# Patient Record
Sex: Female | Born: 1955
Health system: Southern US, Community
[De-identification: ages and names within clinical notes are randomized; demographics above are authoritative.]

## PROBLEM LIST (undated history)

## (undated) DIAGNOSIS — F17200 Nicotine dependence, unspecified, uncomplicated: Secondary | ICD-10-CM

## (undated) DIAGNOSIS — K219 Gastro-esophageal reflux disease without esophagitis: Secondary | ICD-10-CM

## (undated) DIAGNOSIS — C4490 Unspecified malignant neoplasm of skin, unspecified: Secondary | ICD-10-CM

## (undated) DIAGNOSIS — G8929 Other chronic pain: Secondary | ICD-10-CM

## (undated) DIAGNOSIS — R194 Change in bowel habit: Secondary | ICD-10-CM

## (undated) DIAGNOSIS — F419 Anxiety disorder, unspecified: Secondary | ICD-10-CM

## (undated) DIAGNOSIS — N8189 Other female genital prolapse: Secondary | ICD-10-CM

## (undated) DIAGNOSIS — J449 Chronic obstructive pulmonary disease, unspecified: Secondary | ICD-10-CM

## (undated) DIAGNOSIS — H8109 Meniere's disease, unspecified ear: Secondary | ICD-10-CM

## (undated) DIAGNOSIS — E785 Hyperlipidemia, unspecified: Secondary | ICD-10-CM

## (undated) DIAGNOSIS — J439 Emphysema, unspecified: Secondary | ICD-10-CM

## (undated) DIAGNOSIS — F32A Depression, unspecified: Secondary | ICD-10-CM

## (undated) DIAGNOSIS — K589 Irritable bowel syndrome without diarrhea: Secondary | ICD-10-CM

## (undated) DIAGNOSIS — F329 Major depressive disorder, single episode, unspecified: Secondary | ICD-10-CM

## (undated) DIAGNOSIS — R159 Full incontinence of feces: Secondary | ICD-10-CM

## (undated) DIAGNOSIS — D126 Benign neoplasm of colon, unspecified: Secondary | ICD-10-CM

## (undated) DIAGNOSIS — M25512 Pain in left shoulder: Secondary | ICD-10-CM

## (undated) DIAGNOSIS — R000 Tachycardia, unspecified: Secondary | ICD-10-CM

## (undated) HISTORY — DX: Chronic obstructive pulmonary disease, unspecified: J44.9

## (undated) HISTORY — PX: OTHER SURGICAL HISTORY: SHX169

## (undated) HISTORY — DX: Other chronic pain: G89.29

## (undated) HISTORY — DX: Pain in left shoulder: M25.512

## (undated) HISTORY — DX: Unspecified malignant neoplasm of skin, unspecified: C44.90

## (undated) HISTORY — DX: Change in bowel habit: R19.4

## (undated) HISTORY — DX: Benign neoplasm of colon, unspecified: D12.6

## (undated) HISTORY — DX: Emphysema, unspecified: J43.9

## (undated) HISTORY — DX: Depression, unspecified: F32.A

## (undated) HISTORY — DX: Hyperlipidemia, unspecified: E78.5

## (undated) HISTORY — PX: ABDOMINAL HYSTERECTOMY: SHX81

## (undated) HISTORY — DX: Gastro-esophageal reflux disease without esophagitis: K21.9

## (undated) HISTORY — DX: Full incontinence of feces: R15.9

## (undated) HISTORY — PX: BLADDER REPAIR: SHX76

## (undated) HISTORY — PX: KNEE ARTHROSCOPY: SUR90

## (undated) HISTORY — PX: SHOULDER ARTHROSCOPY: SHX128

## (undated) HISTORY — DX: Meniere's disease, unspecified ear: H81.09

## (undated) HISTORY — DX: Major depressive disorder, single episode, unspecified: F32.9

## (undated) HISTORY — DX: Tachycardia, unspecified: R00.0

## (undated) HISTORY — DX: Irritable bowel syndrome, unspecified: K58.9

## (undated) HISTORY — DX: Anxiety disorder, unspecified: F41.9

## (undated) HISTORY — DX: Nicotine dependence, unspecified, uncomplicated: F17.200

## (undated) HISTORY — DX: Other female genital prolapse: N81.89

---

## 1998-02-15 ENCOUNTER — Emergency Department (HOSPITAL_COMMUNITY): Admission: EM | Admit: 1998-02-15 | Discharge: 1998-02-15 | Payer: Self-pay | Admitting: Emergency Medicine

## 1998-02-24 ENCOUNTER — Encounter: Admission: RE | Admit: 1998-02-24 | Discharge: 1998-05-25 | Payer: Self-pay | Admitting: Orthopaedic Surgery

## 1998-07-01 ENCOUNTER — Other Ambulatory Visit: Admission: RE | Admit: 1998-07-01 | Discharge: 1998-07-01 | Payer: Self-pay | Admitting: Family Medicine

## 1999-08-19 ENCOUNTER — Emergency Department (HOSPITAL_COMMUNITY): Admission: EM | Admit: 1999-08-19 | Discharge: 1999-08-19 | Payer: Self-pay | Admitting: Emergency Medicine

## 1999-12-16 ENCOUNTER — Other Ambulatory Visit: Admission: RE | Admit: 1999-12-16 | Discharge: 1999-12-16 | Payer: Self-pay | Admitting: Family Medicine

## 2000-01-31 ENCOUNTER — Encounter (INDEPENDENT_AMBULATORY_CARE_PROVIDER_SITE_OTHER): Payer: Self-pay

## 2000-01-31 ENCOUNTER — Ambulatory Visit (HOSPITAL_COMMUNITY): Admission: RE | Admit: 2000-01-31 | Discharge: 2000-01-31 | Payer: Self-pay | Admitting: Gastroenterology

## 2000-01-31 DIAGNOSIS — D126 Benign neoplasm of colon, unspecified: Secondary | ICD-10-CM

## 2000-01-31 HISTORY — DX: Benign neoplasm of colon, unspecified: D12.6

## 2001-01-17 ENCOUNTER — Other Ambulatory Visit: Admission: RE | Admit: 2001-01-17 | Discharge: 2001-01-17 | Payer: Self-pay | Admitting: Family Medicine

## 2001-02-06 ENCOUNTER — Encounter: Payer: Self-pay | Admitting: Urology

## 2001-02-06 ENCOUNTER — Ambulatory Visit (HOSPITAL_COMMUNITY): Admission: RE | Admit: 2001-02-06 | Discharge: 2001-02-06 | Payer: Self-pay | Admitting: Urology

## 2001-03-29 ENCOUNTER — Ambulatory Visit (HOSPITAL_COMMUNITY): Admission: RE | Admit: 2001-03-29 | Discharge: 2001-03-29 | Payer: Self-pay | Admitting: Urology

## 2002-05-10 ENCOUNTER — Other Ambulatory Visit: Admission: RE | Admit: 2002-05-10 | Discharge: 2002-05-10 | Payer: Self-pay | Admitting: Family Medicine

## 2005-10-21 ENCOUNTER — Other Ambulatory Visit: Admission: RE | Admit: 2005-10-21 | Discharge: 2005-10-21 | Payer: Self-pay | Admitting: Family Medicine

## 2006-09-08 ENCOUNTER — Ambulatory Visit: Payer: Self-pay | Admitting: Family Medicine

## 2008-04-02 LAB — HM DEXA SCAN

## 2008-06-02 ENCOUNTER — Ambulatory Visit: Payer: Self-pay | Admitting: Gastroenterology

## 2008-06-04 ENCOUNTER — Encounter: Admission: RE | Admit: 2008-06-04 | Discharge: 2008-06-04 | Payer: Self-pay | Admitting: Family Medicine

## 2008-06-12 LAB — HM COLONOSCOPY

## 2008-06-13 ENCOUNTER — Encounter: Admission: RE | Admit: 2008-06-13 | Discharge: 2008-06-13 | Payer: Self-pay | Admitting: Family Medicine

## 2008-06-16 ENCOUNTER — Encounter: Payer: Self-pay | Admitting: Gastroenterology

## 2008-06-16 ENCOUNTER — Ambulatory Visit: Payer: Self-pay | Admitting: Gastroenterology

## 2008-06-18 ENCOUNTER — Encounter: Payer: Self-pay | Admitting: Gastroenterology

## 2008-11-24 ENCOUNTER — Ambulatory Visit: Payer: Self-pay | Admitting: Vascular Surgery

## 2009-05-13 LAB — HM PAP SMEAR

## 2009-10-09 ENCOUNTER — Emergency Department (HOSPITAL_COMMUNITY): Admission: EM | Admit: 2009-10-09 | Discharge: 2009-10-09 | Payer: Self-pay | Admitting: Emergency Medicine

## 2009-10-13 ENCOUNTER — Ambulatory Visit (HOSPITAL_COMMUNITY): Admission: RE | Admit: 2009-10-13 | Discharge: 2009-10-13 | Payer: Self-pay | Admitting: Family Medicine

## 2010-11-29 DIAGNOSIS — E785 Hyperlipidemia, unspecified: Secondary | ICD-10-CM | POA: Insufficient documentation

## 2010-11-29 DIAGNOSIS — F17200 Nicotine dependence, unspecified, uncomplicated: Secondary | ICD-10-CM

## 2010-11-29 DIAGNOSIS — G8929 Other chronic pain: Secondary | ICD-10-CM

## 2011-01-25 NOTE — Procedures (Signed)
LOWER EXTREMITY VENOUS REFLUX EXAM   INDICATION:  Bilateral lower extremity varicose veins.   EXAM:  Using color-flow imaging and pulse Doppler spectral analysis, the  right and left common femoral, superficial femoral, popliteal, posterior  tibial, greater and lesser saphenous veins are evaluated.  There is no  evidence suggesting deep venous insufficiency in the right and left  lower extremity.   The right and left saphenofemoral junction is competent.  The right and  left GSV is competent with the caliber as described below.   The right and left proximal short saphenous vein demonstrates  competency.   GSV Diameter (used if found to be incompetent only)                                            Right    Left  Proximal Greater Saphenous Vein           cm       cm  Proximal-to-mid-thigh                     cm       cm  Mid thigh                                 cm       cm  Mid-distal thigh                          cm       cm  Distal thigh                              cm       cm  Knee                                      cm       cm   IMPRESSION:  1. Right and left greater saphenous veins show no evidence of reflux.  2. The right and left greater saphenous veins are not aneurysmal.  3. The right and left greater saphenous veins are not tortuous.  4. The deep venous system is competent.  5. The right and left lesser saphenous vein is competent.   ___________________________________________  Quita Skye. Hart Rochester, M.D.   AC/MEDQ  D:  11/24/2008  T:  11/24/2008  Job:  [id number]

## 2011-01-25 NOTE — Consult Note (Signed)
NEW PATIENT CONSULTATION   Cassandra [name], Cassandra [name]  DOB:  [birth year]                                       11/24/2008  CHART#:[id number]   The patient was referred to Dr. Tania Ade for evaluation of venous arterial  disease in the left leg.  She is 55 year old healthy female who  approximately a month ago noticed some mottling and discoloration of the  left lateral thigh.  She denies any pain, aching, burning, cramping,  throbbing, distal edema, bleeding, ulceration or thrombophlebitis or  deep venous thrombosis in the left leg.  Apparently, she did have a  remote history of some blood clot in the right leg when she was a  teenager but nothing since that time.  She has no history of connective  tissue disorders, has not had a recent burn or any other kind of contact  dermatitis.  She has restless legs at night.  She does not know how far  she can ambulate but states that it is at least 1 block but less than a  mile.   PAST MEDICAL HISTORY:  1. Hyperlipidemia.  2. Chronic regional pain syndrome diagnosed by her medical doctor.  3. Negative for diabetes, hypertension, coronary artery disease or      stroke.   PAST SURGICAL HISTORY:  1. Left shoulder surgery x2.  2. Hysterectomy.  3. Bladder tack.   FAMILY HISTORY:  Positive for coronary artery disease in her mother,  father and brother, diabetes in a sister and grandmother, negative for  stroke.   SOCIAL HISTORY:  She is married, has 1 child.  She smokes a pack  cigarettes per day, has done so for 35+ years.  Does not use alcohol.   REVIEW OF SYSTEMS:  Complains of occasional chest discomfort, shortness  of breath both at rest and with ambulation, productive cough, occasional  wheezing, reflux esophagitis, occasional dizziness, depression,  nervousness.   ALLERGIES:  Codeine, Percodan, Percocet.   MEDICATIONS:  Please see health history form.   PHYSICAL EXAMINATION:  Blood pressure is 120/84, heart rate is  97,  respirations 14.  General:  She is a middle-aged female in no apparent  distress, alert and oriented x3.  Neck:  Supple, 3+ carotid pulses  palpable.  No bruits are audible.  Neurologic:  Normal.  No palpable  adenopathy in the neck.  Chest:  Clear to auscultation.  Cardiovascular:  Regular rhythm, no murmurs.  Abdomen:  Soft, nontender with no masses.  Extremities:  Reveal 3+ femoral, popliteal and posterior tibial pulses  palpable bilaterally.  There is no distal edema or evidence of venous  insufficiency, hyperpigmentation, ulceration or varicosities.  Left  lateral thigh has a peculiar appearance with some honeycomb pattern type  mottling with no ulceration or tenderness and no evidence of any trauma  to this area.  As noted, there is no evidence of any venous or arterial  insufficiency.  This extends up to the buttock crease and down to about  the knee on the lateral aspect only of the left leg.  It is not present  on the right lower extremity.   Venous duplex exam reveals no evidence of any reflux in the saphenous  veins bilaterally and the deep systems appear normal.   I am not certain what this represents in the left lateral thigh but it  does not appear  to be a vascular problem.  She has no evidence of  arterial insufficiency or venous disease.  If this should continue to be  present or progress, she should see a dermatologist for further  evaluation.   Quita Skye Hart Rochester, M.D.  Electronically Signed   JDL/MEDQ  D:  11/24/2008  T:  11/25/2008  Job:  [id number]   cc:   Belva Agee, Dr.  Ernestina Penna, M.D.

## 2011-01-26 ENCOUNTER — Encounter: Payer: Self-pay | Admitting: Family Medicine

## 2011-01-28 NOTE — Op Note (Signed)
[facility]  Patient:    Cassandra [name], Cassandra [name]                     MRN: [id number] Proc. Date: 03/29/01 Adm. Date:  [id number] Attending:  Evlyn Clines                           Operative Report  PROCEDURE:  Revision of SPARC sling.  PREOPERATIVE DIAGNOSIS:  Vaginal erosion of sling mesh.  POSTOPERATIVE DIAGNOSIS:  Vaginal erosion of sling mesh.  SURGEON:  Excell Seltzer. Annabell Howells, M.D.  ANESTHESIA:  General.  DRAIN:  Foley catheter.  COMPLICATIONS:  None.  INDICATIONS:  [name] is a 55 year old white female, who underwent a SPARC sling for stress incontinence approximately seven weeks ago.  She had a good response initially but approximately two weeks ago, she and her husband attempted intercourse; he had pain.  She came to the office, and she was found to have erosion of the mesh into the vaginal lumen.  Conservative to aid healing were unsuccessful.  She presents now for excision of the exposed mesh and revision of the repair.  FINDINGS AT PROCEDURE:  The patient was given p.o. Tequin preoperatively.  She was taken to the operating room where a general anesthetic was induced.  She was placed in lithotomy position.  Her perineum and genitalia were prepped with Betadine solution.  She was draped in the usual sterile fashion.  A weighted vaginal retractor was inserted.  A Foley catheter was placed.  The bladder was drained.  The vaginal mesh was easily identifiable in the mid urethral level.  The Struhle scissors were used to dissect around the mesh approximately 1 cm lateral on each side to the exposed portion.  Once the dissection had been performed, the area was copiously irrigated, first with Betadine then with antibiotic solution.  A 3 cm length of the mesh was then excised, taking it back to areas that appeared to be well seated within the tissue.  Once this had been performed, the area was reirrigated; hemostasis was achieved.  There was about a  2 cm circular defect where the mesh had been eroded.  In order to close this, I elevated a flap of some vaginal mucosa on the left.  There were a couple of prominent tags of vaginal tissue that had been present from prior to her initial surgery.  The one on the left was undermined and filleted to provide an adequate covering of the defect on the anterior vaginal wall.  This was then approximated over the defect using interrupted figure-of-eight 3-0 Vicryl stitches.  Once a secure closure had been performed and hemostasis was achieved, the area was irrigated.  I then removed the Foley catheter and passed the cystoscope with the 12 degree lens. The urethra appeared intact.  There was elevation in the mid urethral level from the repair, and that appeared to be still in good position.  At this point, the cystoscope was removed, and a Foley catheter was reinserted.  The patient was taken down from lithotomy position.  Her anesthetic was reversed. She was moved to the recovery room in stable condition.  There were no complications during the procedure. DD:  03/29/01 TD:  03/29/01 Job: [id number]

## 2011-01-28 NOTE — Op Note (Signed)
[facility]  Patient:    Cassandra [name], Cassandra [name]                     MRN: [id number] Proc. Date: 02/06/01 Adm. Date:  [id number] Attending:  Evlyn Clines CC:         Monica Becton, M.D.   Operative Report  PROCEDURE:  SPARC sling.  PREOPERATIVE DIAGNOSIS:  Stress urinary incontinence.  POSTOPERATIVE DIAGNOSIS:  Stress urinary incontinence.  SURGEON:  Excell Seltzer. Annabell Howells, M.D.  ANESTHESIA:  General.  DRAINS:  Foley catheter and vaginal pack.  COMPLICATIONS:  None.  INDICATIONS:  Ms. Beckley is a 55 year old white female with stress incontinence who has elected to undergo a SPARC sling.  FINDINGS AND PROCEDURE:  The patient was given p.o. Tequin and taken to the operating room where a general anesthetic was induced.  Her mons was shaved. She was prepped with Betadine solution and draped in the usual sterile fashion.  A Foley catheter was inserted and the bladder was drained.  Thirty cc of 0.08% Marcaine were injected in the retropubic space on each side and then the anterior vaginal wall was infiltrated with about 10 cc of the same Marcaine mix.  The anterior vaginal wall was then opened in the midline over the mid-urethra for approximately 2 cm.  Mucosa was elevated off the pubourethral fascia for approximately 1.5 to 2.0 cm laterally on each side. Puncture wounds were then made one fingerbreadth above the pubis and one fingerbreadth lateral to the midline using a 15 blade.  The [facility] trocars were then passed from above, first on the right, then on the left, with great care being taken to hug the underside of the pubis.  The urethra was positioned out of the way with the tip of the index finger which was used to guide the trocar into the vaginal incision; this was then repeated on the left.  Once both trocars were in position, cystoscopy was performed using a 21-French scope and the 12 and 70 degree lenses.  Examination revealed no evidence of  bladder or urethral injury.  The ureters were unremarkable.  Once cystoscopy had been completed, the bladder was left full.  The Foley catheter was reinserted and plugged.  The [facility] sling material was then snapped onto the trocars and pulled up into proper position.  Once in good position with an approximately 24-French diameter gap between the urethra and the sling material, the sling sheaths were removed, the position was adjusted to my satisfaction; the midline tethering suture was then trimmed.  The anterior vaginal wall was then closed with a running-locked 2-0 Vicryl.  The Foley was removed.  Pressure on the bladder produced some leakage which indicated no over-correction of the urethra.  The Foley catheter was then reinserted and the bladder was drained. The abdominal ends of the sling were then trimmed to a position just below the skin.  The abdominal wounds were cleaned, prepped with Benzoin and Steri-Strips were applied.  A two-inch Iodoform vaginal pack was placed.  The Foley catheter was placed to straight drainage.  Patient was taken down from lithotomy position, her anesthetic was reversed and she was moved to the recovery room in stable condition.  There were no complications. DD:  02/06/01 TD:  02/06/01 Job: [id number]

## 2011-01-28 NOTE — Procedures (Signed)
[facility]  Patient:    Cassandra [name], Cassandra [name]                     MRN: [id number] Proc. Date: 01/31/00 Adm. Date:  [id number] Disc. Date: [id number] Attending:  Deneen Harts CC:         Monica Becton, M.D.                           Procedure Report  PROCEDURE PERFORMED:  Colonoscopic polypectomy.  ENDOSCOPIST:  Griffith Citron, M.D.  INDICATIONS FOR PROCEDURE:  The patient is a 55 year old white female undergoing colonoscopy to evaluate symptoms of hematochezia.  DESCRIPTION OF PROCEDURE:  After reviewing the nature of the procedure with the patient including potential risks and complications, informed consent was signed.  In particular, the possibilities of hemorrhage and perforation discussed.  Using an Olympus pediatric PCF-140L video colonoscope, rectum was intubated after a normal digital examination.  Scope was inserted and advanced without difficulty around the entire length of the colon to the cecum, identified by the appendiceal orifice and ileocecal valve.  The colon was decompressed and scope withdrawn with careful inspection of the entire colon in a retrograde manner including a retroflex view in the rectal vault.  Significant finding included a sigmoid sessile polyp approximately 8 mm in diameter.  This was resected with electrocautery snare, recovered and submitted to pathology.  A half dozen diminutive polyps were resected with hot biopsy forceps throughout the rectosigmoid. No additional lesion was identified.  The colon was decompressed and the scope withdrawn.  The patient tolerated the procedure without difficulty being maintained on Datascope monitor and low-flow oxygen throughout.  TIME:  2.  TECHNICAL:  2.  PREPARATION:  2.  TOTAL SCORE:  6.  ASSESSMENT: 1. Rectosigmoid polyps--benign, resected. 2. Hematochezia--probable intermittent anorectal source.  RECOMMENDATIONS: 1. Postpolypectomy instructions  reviewed. 2. Follow up pathology. 3. Repeat colonoscopy in three years if adenoma. DD:  01/31/00 TD:  02/04/00 Job: [id number]

## 2012-12-05 ENCOUNTER — Other Ambulatory Visit: Payer: Self-pay | Admitting: *Deleted

## 2012-12-05 NOTE — Telephone Encounter (Signed)
Needs a pharmacy enetered

## 2012-12-06 ENCOUNTER — Other Ambulatory Visit: Payer: Self-pay | Admitting: Physician Assistant

## 2012-12-06 ENCOUNTER — Telehealth: Payer: Self-pay | Admitting: *Deleted

## 2012-12-06 ENCOUNTER — Encounter: Payer: Self-pay | Admitting: *Deleted

## 2012-12-06 DIAGNOSIS — G4700 Insomnia, unspecified: Secondary | ICD-10-CM

## 2012-12-06 DIAGNOSIS — N390 Urinary tract infection, site not specified: Secondary | ICD-10-CM

## 2012-12-06 MED ORDER — NITROFURANTOIN MACROCRYSTAL 50 MG PO CAPS: 50.0000 mg | ORAL_CAPSULE | Freq: Two times a day (BID) | ORAL | Status: DC

## 2012-12-06 MED ORDER — ESZOPICLONE 3 MG PO TABS: 3.0000 mg | ORAL_TABLET | Freq: Every day | ORAL | Status: DC

## 2012-12-06 MED ORDER — MECLIZINE HCL 25 MG PO TABS: 25.0000 mg | ORAL_TABLET | Freq: Two times a day (BID) | ORAL | Status: DC | PRN

## 2012-12-06 NOTE — Telephone Encounter (Signed)
Pt needs refill on lunesta 3mg . One po at bedtime as needed.

## 2012-12-06 NOTE — Telephone Encounter (Signed)
This encounter was created in error - please disregard.

## 2012-12-10 ENCOUNTER — Other Ambulatory Visit: Payer: Self-pay | Admitting: Physician Assistant

## 2012-12-10 MED ORDER — ESZOPICLONE 3 MG PO TABS: 3.0000 mg | ORAL_TABLET | Freq: Every day | ORAL | Status: DC

## 2012-12-10 NOTE — Progress Notes (Signed)
Duplicate message. 

## 2012-12-11 ENCOUNTER — Telehealth: Payer: Self-pay | Admitting: Pharmacist

## 2012-12-11 DIAGNOSIS — F411 Generalized anxiety disorder: Secondary | ICD-10-CM

## 2012-12-11 DIAGNOSIS — G894 Chronic pain syndrome: Secondary | ICD-10-CM

## 2012-12-11 DIAGNOSIS — E785 Hyperlipidemia, unspecified: Secondary | ICD-10-CM

## 2012-12-11 MED ORDER — PREGABALIN 75 MG PO CAPS: 75.0000 mg | ORAL_CAPSULE | Freq: Two times a day (BID) | ORAL | Status: DC

## 2012-12-11 MED ORDER — EZETIMIBE-ATORVASTATIN 10-20 MG PO TABS: 1.0000 | ORAL_TABLET | Freq: Every day | ORAL | Status: DC

## 2012-12-11 MED ORDER — OXYCODONE-ACETAMINOPHEN 5-325 MG PO TABS: 1.0000 | ORAL_TABLET | Freq: Three times a day (TID) | ORAL | Status: DC | PRN

## 2012-12-11 MED ORDER — ALPRAZOLAM 0.5 MG PO TBDP: 0.5000 mg | ORAL_TABLET | Freq: Two times a day (BID) | ORAL | Status: DC | PRN

## 2012-12-11 NOTE — Telephone Encounter (Signed)
rx printed out for oxycodone/APAP, xanax and lyrica - signed by ACM. Rx's left at front desk to pick up Left #21 Liptruzet at front deck

## 2012-12-18 ENCOUNTER — Other Ambulatory Visit: Payer: Self-pay | Admitting: *Deleted

## 2012-12-18 MED ORDER — ESCITALOPRAM OXALATE 10 MG PO TABS: 10.0000 mg | ORAL_TABLET | Freq: Every day | ORAL | Status: DC

## 2012-12-27 ENCOUNTER — Telehealth: Payer: Self-pay | Admitting: Pharmacist

## 2012-12-27 NOTE — Telephone Encounter (Signed)
Patient needs evaluation by primary provider.   Her previous PCP Helene Kelp is no longer here and there is not appt for anyone other that pharmacist at this time.  Please make appt for patient to see someone and evaluate Hpylori and need for colonoscopy and endoscopy.

## 2012-12-27 NOTE — Telephone Encounter (Signed)
Pt aware and appt made with [facility] 4/29

## 2013-01-08 ENCOUNTER — Ambulatory Visit (INDEPENDENT_AMBULATORY_CARE_PROVIDER_SITE_OTHER): Payer: Medicare Other | Admitting: General Practice

## 2013-01-08 ENCOUNTER — Telehealth: Payer: Self-pay | Admitting: Gastroenterology

## 2013-01-08 ENCOUNTER — Encounter: Payer: Self-pay | Admitting: General Practice

## 2013-01-08 VITALS — BP 150/83 | HR 84 | Temp 97.7°F | Ht 66.0 in | Wt 122.0 lb

## 2013-01-08 DIAGNOSIS — K219 Gastro-esophageal reflux disease without esophagitis: Secondary | ICD-10-CM

## 2013-01-08 DIAGNOSIS — R109 Unspecified abdominal pain: Secondary | ICD-10-CM

## 2013-01-08 DIAGNOSIS — R634 Abnormal weight loss: Secondary | ICD-10-CM

## 2013-01-08 DIAGNOSIS — G894 Chronic pain syndrome: Secondary | ICD-10-CM

## 2013-01-08 NOTE — Progress Notes (Signed)
  Subjective:    Patient ID: Cassandra [name], female    DOB: [birth year], 57 y.o.   MRN: [id number]  HPI Patient presents today with concerns of recent weight loss, nausea, vomiting, and bloody stools. Reports onset was in January, diagnosed with H. Pylori in February and treated. Reports she has gained 8 pounds, drinking 3 ensures daily, and eating yogurt. Reports having decreased appetite. Reports that her digestive system doesn't seem normal. Reports at times with swallowing throat feels irritated.     Review of Systems  Constitutional: Positive for activity change and appetite change.       More active than two months ago, but less active than normal.  Respiratory: Negative for chest tightness and shortness of breath.   Cardiovascular: Negative for chest pain.  Gastrointestinal: Positive for constipation. Negative for abdominal distention.       Dark blood in stool noted 2 weeks ago   Skin: Negative.   Neurological: Negative for headaches.       Vertigo  Psychiatric/Behavioral: The patient is nervous/anxious.        Reports anxiety increases with chronic pain issues       Objective:   Physical Exam  Constitutional: She is oriented to person, place, and time. She appears well-developed.  Cardiovascular: Normal rate, regular rhythm and normal heart sounds.   Pulmonary/Chest:  Diminished breath sounds througout  Abdominal: Soft. Bowel sounds are normal. She exhibits no distension and no mass. There is no tenderness. There is no rebound and no guarding.  Neurological: She is alert and oriented to person, place, and time.  Skin: Skin is warm and dry.  Psychiatric: She has a normal mood and affect.          Assessment & Plan:  Referral to GI Continue current medications RTO if symptoms worsen or unresolved Patient verbalized understanding Raymon Mutton, FNP-C

## 2013-01-08 NOTE — Patient Instructions (Addendum)
Helicobacter Pylori and Ulcer Disease  An ulcer may be in your stomach (gastric ulcer) or in the first part of your small bowel, which is called the duodenum (duodenal ulcer). An ulcer is a break in the stomach or duodenum lining. The break wears down into the deeper tissue. Helicobacter pylori (H. pylori) is a type of germ (bacteria) that may cause the majority of gastric or duodenal ulcers.  CAUSES   · A germ (bacterium). H. pylori can weaken the protective mucous coating of the stomach and duodenum. This allows acid to get through to the sensitive lining of the stomach or duodenum and an ulcer can then form.  · Certain medications.  · Using substances that can bother the lining of the stomach (alcohol, tobacco or medications such as Advil or Motrin) in the presence of H.pylori infection. This can increase the chances of getting an ulcer.  · Cancer (rarely).  Most people infected with H. pylori do not get ulcers. It is not known how people catch H. pylori. It may be through food or water. H. pylori has been found in the saliva of some infected people. Therefore, the bacteria may also spread through mouth-to-mouth contact such as kissing.  SYMPTOMS   The problems (symptoms) of ulcer disease are usually:  · A burning or gnawing of the mid-upper belly (abdomen). This is often worse on an empty stomach. It may get better with food. This may be associated with feeling sick to your stomach (nausea), bloating and vomiting.  · If the ulcer results in bleeding, it can cause:  · Black, tarry stools.  · Throwing up bright red blood.  · Throwing up coffee ground looking materials.  With severe bleeding, there may be loss of consciousness and shock.  Besides ulcer disease, H. pylori can also cause chronic gastritis (irritation of the lining of the stomach without ulcer) or stomach acid-type discomfort. You may not have symptoms even though you have an H. pylori infection. Although this is an infection, you may not have usual  infection symptoms (such as fever).  DIAGNOSIS   Ulcer disease can be diagnosed in many different ways. If you have an ulcer, it is important to know whether or not it is caused by H. Pylori. Treatment for an ulcer caused by H. pylori is different from that for an ulcer with other causes. The best way to detect H. pylori is taking tissue directly from the ulcer during an endoscopy test.   · An endoscopy is an exam that uses an endoscope. This is a thin, lighted tube with a small camera on the end. It is like a flexible telescope. The patient is given a drug to make them calm (sedative). The caregiver eases the endoscope into the mouth and down the throat to the stomach and duodenum. This allows the doctor to see the lining of the esophagus, stomach and duodenum.  · If an endoscopy is not needed, then H. pylori can be detected with tests of the blood, stool or even breath.  TREATMENT   · H. pylori peptic ulcer treatment usually involves a combination of:  · Medicines that kill germs (antibiotics).  · Acid suppressors.  · Stomach protectors.  · The use of only one medication to treat H. pylori is not recommended. The most proven treatment is a 2 week course of treatment called triple therapy. It involves taking two antibiotics to kill the bacteria and either an acid suppressor or stomach-lining shield. Two-week triple therapy reduces ulcer symptoms,   kills the bacteria, and prevents ulcers from coming back in many patients.  · Unfortunately, patients may find triple therapy hard to do. This is because it involves taking as many as 20 pills a day. Also, the antibiotics used in triple therapy may cause mild side effects. These include nausea, vomiting, diarrhea, dark stools, a metallic taste in the mouth, dizziness, headache and yeast infections in women. Talk to your caregiver if you have any of these side effects.  HOME CARE INSTRUCTIONS   · Take your medications as directed and for as long as prescribed. Contact your  caregiver if you have problems or side effects from your medications.  · Continue regular work and usual activities unless told otherwise by your caregiver.  · Avoid tobacco, alcohol and caffeine. Tobacco use will decrease and slow healing.  · Avoid medications that are harmful. This includes aspirin and NSAIDS such as ibuprofen and naproxen.  · Avoid foods that seem to aggravate or cause discomfort.  · There are many over-the-counter products available to control stomach acid and other symptoms. Discuss these with your caregiver before using them. Do not  stop taking prescription medications for over-the-counter medications without talking with your caregiver.  · Special diets are not usually needed.  · Keep any follow-up appointments and blood tests as directed.  SEEK MEDICAL CARE IF:   · Your pain or other ulcer symptoms do not improve within a few days of starting treatment.  · You develop diarrhea. This can be a problem related to certain treatments.  · You have ongoing indigestion or heartburn even if your main ulcer symptoms are improved.  · You think you have any side effects from your medications or if you do not understand how to use your medications right.  SEEK IMMEDIATE MEDICAL CARE IF:   Any of the following happen:  · You develop bright red, rectal bleeding.  · You develop dark black, tarry stools.  · You throw up (vomit) blood.  · You become light-headed, weak, have fainting episodes, or become sweaty, cold and clammy.  · You have severe abdominal pain not controlled by medications. Do not take pain medications unless ordered by your caregiver.  MAKE SURE YOU:   · Understand these instructions.  · Will watch your condition.  · Will get help right away if you are not doing well or get worse.  Document Released: 11/19/2003 Document Revised: 11/21/2011 Document Reviewed: 04/17/2008  [employer]® Patient Information ©2013 [employer].

## 2013-01-09 NOTE — Telephone Encounter (Signed)
Pt scheduled to see Doug Sou PA 01/11/13@10 :45am. Debbie to notify pt of appt date and time.

## 2013-01-10 ENCOUNTER — Other Ambulatory Visit: Payer: Self-pay | Admitting: General Practice

## 2013-01-10 ENCOUNTER — Telehealth: Payer: Self-pay | Admitting: Physician Assistant

## 2013-01-10 DIAGNOSIS — G894 Chronic pain syndrome: Secondary | ICD-10-CM

## 2013-01-10 DIAGNOSIS — F411 Generalized anxiety disorder: Secondary | ICD-10-CM

## 2013-01-10 MED ORDER — OXYCODONE-ACETAMINOPHEN 5-325 MG PO TABS: 1.0000 | ORAL_TABLET | Freq: Three times a day (TID) | ORAL | Status: DC | PRN

## 2013-01-10 MED ORDER — PREGABALIN 75 MG PO CAPS: 75.0000 mg | ORAL_CAPSULE | Freq: Two times a day (BID) | ORAL | Status: DC

## 2013-01-10 MED ORDER — ALPRAZOLAM 0.5 MG PO TABS: ORAL_TABLET | ORAL | Status: DC

## 2013-01-11 ENCOUNTER — Encounter: Payer: Self-pay | Admitting: Gastroenterology

## 2013-01-11 ENCOUNTER — Ambulatory Visit (INDEPENDENT_AMBULATORY_CARE_PROVIDER_SITE_OTHER): Payer: Medicare Other | Admitting: Gastroenterology

## 2013-01-11 VITALS — BP 120/80 | HR 80 | Ht 66.0 in | Wt 121.8 lb

## 2013-01-11 DIAGNOSIS — R1013 Epigastric pain: Secondary | ICD-10-CM | POA: Insufficient documentation

## 2013-01-11 DIAGNOSIS — F17200 Nicotine dependence, unspecified, uncomplicated: Secondary | ICD-10-CM | POA: Insufficient documentation

## 2013-01-11 DIAGNOSIS — R634 Abnormal weight loss: Secondary | ICD-10-CM | POA: Insufficient documentation

## 2013-01-11 NOTE — Patient Instructions (Addendum)
You have been scheduled for an endoscopy with propofol. Please follow written instructions given to you at your visit today. If you use inhalers (even only as needed), please bring them with you on the day of your procedure. Your physician has requested that you go to www.startemmi.com and enter the access code given to you at your visit today. This web site gives a general overview about your procedure. However, you should still follow specific instructions given to you by our office regarding your preparation for the procedure.  You have been scheduled for a CT scan of the abdomen and pelvis at [facility] ([address]---this is in the same building as Architectural [facility]).   You are scheduled on 01/17/13 at 1:00pm. You should arrive 15 minutes prior to your appointment time for registration. Please follow the written instructions below on the day of your exam:  WARNING: IF YOU ARE ALLERGIC TO IODINE/X-RAY DYE, PLEASE NOTIFY RADIOLOGY IMMEDIATELY AT [phone]! YOU WILL BE GIVEN A 13 HOUR PREMEDICATION PREP.  1) Do not eat or drink anything after 9:00am (4 hours prior to your test) 2) You have been given 2 bottles of oral contrast to drink. The solution may taste               better if refrigerated, but do NOT add ice or any other liquid to this solution. Shake             well before drinking.    Drink 1 bottle of contrast @ 11:00am (2 hours prior to your exam)  Drink 1 bottle of contrast @ 12:00am (1 hour prior to your exam)  You may take any medications as prescribed with a small amount of water except for the following: Metformin, Glucophage, Glucovance, Avandamet, Riomet, Fortamet, Actoplus Met, Janumet, Glumetza or Metaglip. The above medications must be held the day of the exam AND 48 hours after the exam.  The purpose of you drinking the oral contrast is to aid in the visualization of your intestinal tract. The contrast solution may cause some diarrhea. Before your exam  is started, you will be given a small amount of fluid to drink. Depending on your individual set of symptoms, you may also receive an intravenous injection of x-ray contrast/dye. Plan on being at [facility] for 30 minutes or long, depending on the type of exam you are having performed.  This test typically takes 30-45 minutes to complete.  If you have any questions regarding your exam or if you need to reschedule, you may call the CT department at [phone] between the hours of 8:00 am and 5:00 pm, Monday-Friday.  ________________________________________________________________________  Thank you for choosing me and [facility].  Doug Sou, PA-C

## 2013-01-11 NOTE — Progress Notes (Signed)
01/11/2013 Cassandra [name] [id number] 04/02/1956   History of Present Illness:  Patient is a 57 year old female who presents to our office today with complaints of weight loss and abdominal pain.  Began having epigastric pain in February and she was told that she might have an ulcer.  She was treated for Hpylori at that time as well.  She has lost 42 pounds in about 2 months time, but has now been drinking Ensure to help her put some weight back on and she is back up 8 pounds according to her report.  Still has some epigastric discomfort.  Says that a lot of labs have been performed through her PCP recently including thyroid studies (we do not have those records at this time, however, we are working on obtaining them).  She is on BID PPI.  Had one episode of constipation and took laxatives, but now bowels have been normal.  Colonoscopy 06/2008 showed one small TA and it was recommended that she have a repeat colonoscopy in 5 years from that time.  Current Medications, Allergies, Past Medical History, Past Surgical History, Family History and Social History were reviewed in [employer] record.   Physical Exam: There were no vitals taken for this visit. General: Well developed, white female in no acute distress Head: Normocephalic and atraumatic Eyes:  sclerae anicteric, conjunctiva pink  Ears: Normal auditory acuity Lungs: Clear throughout to auscultation Heart: Regular rate and rhythm Abdomen: Soft, non-distended. No masses, no hepatomegaly. Normal bowel sounds.  Mild epigastric TTP without R/R/G. Musculoskeletal: Symmetrical with no gross deformities  Extremities: No edema  Neurological: Alert oriented x 4, grossly nonfocal Psychological:  Alert and cooperative. Normal mood and affect  Assessment and Recommendations: -Weight loss of 42 pounds in 2 months in a patient who is a smoker. -Epigastric abdominal pain and recent treatment for Hpylori.  *Will schedule CT scan of  the chest/abdomen/pelvis with contrast. *EGD with Dr. Arlyce Dice.  The risks, benefits, and alternatives were discussed with the patient and she consents to proceed.  *She says that she's had several labs performed within the last couple of months, so we will contact her PCP to get those records. *Will need repeat colonoscopy in near future as well.

## 2013-01-14 NOTE — Progress Notes (Signed)
Reviewed and agree with management. Analina Filla D. Kendelle Schweers, M.D., FACG  

## 2013-01-17 ENCOUNTER — Ambulatory Visit (INDEPENDENT_AMBULATORY_CARE_PROVIDER_SITE_OTHER)
Admission: RE | Admit: 2013-01-17 | Discharge: 2013-01-17 | Disposition: A | Discharge status: Home / Self Care | Payer: Medicare Other | Source: Ambulatory Visit | Attending: Gastroenterology | Admitting: Gastroenterology

## 2013-01-17 DIAGNOSIS — R634 Abnormal weight loss: Secondary | ICD-10-CM

## 2013-01-17 DIAGNOSIS — R1013 Epigastric pain: Secondary | ICD-10-CM

## 2013-01-17 DIAGNOSIS — F17200 Nicotine dependence, unspecified, uncomplicated: Secondary | ICD-10-CM

## 2013-01-17 MED ORDER — IOHEXOL 300 MG/ML  SOLN
100.0000 mL | Freq: Once | INTRAMUSCULAR | Status: AC | PRN
  Administered 2013-01-17: 100 mL via INTRAVENOUS

## 2013-01-22 ENCOUNTER — Ambulatory Visit (AMBULATORY_SURGERY_CENTER): Payer: Medicare Other | Admitting: Gastroenterology

## 2013-01-22 ENCOUNTER — Other Ambulatory Visit (INDEPENDENT_AMBULATORY_CARE_PROVIDER_SITE_OTHER): Payer: Medicare Other

## 2013-01-22 ENCOUNTER — Encounter: Payer: Self-pay | Admitting: Gastroenterology

## 2013-01-22 VITALS — BP 129/88 | HR 76 | Temp 98.5°F | Resp 12 | Ht 66.0 in | Wt 121.0 lb

## 2013-01-22 DIAGNOSIS — R634 Abnormal weight loss: Secondary | ICD-10-CM

## 2013-01-22 DIAGNOSIS — R1013 Epigastric pain: Secondary | ICD-10-CM

## 2013-01-22 LAB — COMPREHENSIVE METABOLIC PANEL
ALT: 14 U/L (ref 0–35)
AST: 19 U/L (ref 0–37)
Albumin: 4.1 g/dL (ref 3.5–5.2)
CO2: 28 mEq/L (ref 19–32)
Calcium: 9.2 mg/dL (ref 8.4–10.5)
Chloride: 104 mEq/L (ref 96–112)
Creatinine, Ser: 0.8 mg/dL (ref 0.4–1.2)
GFR: 77.43 mL/min (ref >60.00)
Potassium: 3.8 mEq/L (ref 3.5–5.1)
Sodium: 140 mEq/L (ref 135–145)
Total Protein: 7.5 g/dL (ref 6.0–8.3)

## 2013-01-22 LAB — BASIC METABOLIC PANEL WITH GFR
CO2: 28 mEq/L (ref 19–32)
Chloride: 107 mEq/L (ref 96–112)
Potassium: 4.3 mEq/L (ref 3.5–5.3)
Sodium: 142 mEq/L (ref 135–145)

## 2013-01-22 LAB — CBC
Platelets: 317 10*3/uL (ref 150.0–400.0)
RBC: 4.89 Mil/uL (ref 3.87–5.11)

## 2013-01-22 LAB — LIPASE: Lipase: 19 U/L (ref 11.0–59.0)

## 2013-01-22 MED ORDER — SODIUM CHLORIDE 0.9 % IV SOLN: 500.0000 mL | INTRAVENOUS | Status: DC

## 2013-01-22 NOTE — Progress Notes (Signed)
Lidocaine-40mg IV prior to Propofol InductionPropofol given over incremental dosages 

## 2013-01-22 NOTE — Patient Instructions (Signed)
YOU HAD AN ENDOSCOPIC PROCEDURE TODAY AT THE [facility]: Refer to the procedure report that was given to you for any specific questions about what was found during the examination.  If the procedure report does not answer your questions, please call your gastroenterologist to clarify.  If you requested that your care partner not be given the details of your procedure findings, then the procedure report has been included in a sealed envelope for you to review at your convenience later.  YOU SHOULD EXPECT: Some feelings of bloating in the abdomen. Passage of more gas than usual.  Walking can help get rid of the air that was put into your GI tract during the procedure and reduce the bloating. If you had a lower endoscopy (such as a colonoscopy or flexible sigmoidoscopy) you may notice spotting of blood in your stool or on the toilet paper. If you underwent a bowel prep for your procedure, then you may not have a normal bowel movement for a few days.  DIET: Your first meal following the procedure should be a light meal and then it is ok to progress to your normal diet.  A half-sandwich or bowl of soup is an example of a good first meal.  Heavy or fried foods are harder to digest and may make you feel nauseous or bloated.  Likewise meals heavy in dairy and vegetables can cause extra gas to form and this can also increase the bloating.  Drink plenty of fluids but you should avoid alcoholic beverages for 24 hours.  ACTIVITY: Your care partner should take you home directly after the procedure.  You should plan to take it easy, moving slowly for the rest of the day.  You can resume normal activity the day after the procedure however you should NOT DRIVE or use heavy machinery for 24 hours (because of the sedation medicines used during the test).    SYMPTOMS TO REPORT IMMEDIATELY: A gastroenterologist can be reached at any hour.  During normal business hours, 8:30 AM to 5:00 PM Monday through Friday,  call [phone].  After hours and on weekends, please call the GI answering service at [phone] who will take a message and have the physician on call contact you.  Following upper endoscopy (EGD)  Vomiting of blood or coffee ground material  New chest pain or pain under the shoulder blades  Painful or persistently difficult swallowing  New shortness of breath  Fever of 100F or higher  Black, tarry-looking stools  FOLLOW UP: If any biopsies were taken you will be contacted by phone or by letter within the next 1-3 weeks.  Call your gastroenterologist if you have not heard about the biopsies in 3 weeks.  Our staff will call the home number listed on your records the next business day following your procedure to check on you and address any questions or concerns that you may have at that time regarding the information given to you following your procedure. This is a courtesy call and so if there is no answer at the home number and we have not heard from you through the emergency physician on call, we will assume that you have returned to your regular daily activities without incident.  SIGNATURES/CONFIDENTIALITY: You and/or your care partner have signed paperwork which will be entered into your electronic medical record.  These signatures attest to the fact that that the information above on your After Visit Summary has been reviewed and is understood.  Full responsibility of   the confidentiality of this discharge information lies with you and/or your care-partner. 

## 2013-01-22 NOTE — Op Note (Signed)
[facility] [address] [place], [address]   ENDOSCOPY PROCEDURE REPORT  PATIENT: Cassandra [name], Cassandra [name]  MR#: [id number] BIRTHDATE: 02-05-1956 , 57  yrs. old GENDER: Female ENDOSCOPIST: Louis Meckel, MD REFERRED BY:  Rudi Heap, M.D. PROCEDURE DATE:  01/22/2013 PROCEDURE:  EGD, diagnostic ASA CLASS:     Class II INDICATIONS:  Epigastric pain.   Weight loss. MEDICATIONS: MAC sedation, administered by CRNA, propofol (Diprivan) 100mg  IV, and Simethicone 0.6cc PO TOPICAL ANESTHETIC:  DESCRIPTION OF PROCEDURE: After the risks benefits and alternatives of the procedure were thoroughly explained, informed consent was obtained.  The LB GIF-H180 [id number] endoscope was introduced through the mouth and advanced to the third portion of the duodenum. Without limitations.  The instrument was slowly withdrawn as the mucosa was fully examined.      The upper, middle and distal third of the esophagus were carefully inspected and no abnormalities were noted.  The z-line was well seen at the GEJ.  The endoscope was pushed into the fundus which was normal including a retroflexed view.  The antrum, gastric body, first and second part of the duodenum were unremarkable. Retroflexed views revealed no abnormalities.     The scope was then withdrawn from the patient and the procedure completed.  COMPLICATIONS: There were no complications. ENDOSCOPIC IMPRESSION: Normal EGD  RECOMMENDATIONS: CBC, comprehensive metabolic profile, lipase, amylase Office visit 2-3 weeks REPEAT EXAM:  eSigned:  Louis Meckel, MD 01/22/2013 1:57 PM   CC:

## 2013-01-23 ENCOUNTER — Telehealth: Payer: Self-pay | Admitting: *Deleted

## 2013-01-23 NOTE — Telephone Encounter (Signed)
  Follow up Call-  Call back number 01/22/2013  Post procedure Call Back phone  # [phone]  Permission to leave phone message Yes     Patient questions:  Do you have a fever, pain , or abdominal swelling? no Pain Score  0 *  Have you tolerated food without any problems? yes  Have you been able to return to your normal activities? yes  Do you have any questions about your discharge instructions: Diet   no Medications  no Follow up visit  no  Do you have questions or concerns about your Care? no  Actions: * If pain score is 4 or above: No action needed, pain <4.

## 2013-01-23 NOTE — Progress Notes (Signed)
Quick Note:  Please inform the patient that lab work was normal and to continue current plan of action. She needs an office visit in about 3 weeks ______

## 2013-01-25 NOTE — Telephone Encounter (Signed)
error 

## 2013-02-07 ENCOUNTER — Encounter: Payer: Self-pay | Admitting: Gastroenterology

## 2013-02-07 ENCOUNTER — Other Ambulatory Visit: Payer: Self-pay | Admitting: Family Medicine

## 2013-02-07 ENCOUNTER — Ambulatory Visit (INDEPENDENT_AMBULATORY_CARE_PROVIDER_SITE_OTHER): Payer: Medicare Other | Admitting: Gastroenterology

## 2013-02-07 VITALS — BP 114/70 | HR 80 | Ht 66.0 in | Wt 121.6 lb

## 2013-02-07 DIAGNOSIS — Z860100 Personal history of colon polyps, unspecified: Secondary | ICD-10-CM | POA: Insufficient documentation

## 2013-02-07 DIAGNOSIS — R1013 Epigastric pain: Secondary | ICD-10-CM

## 2013-02-07 NOTE — Progress Notes (Signed)
History of Present Illness:  Ms. Cassandra [name] returned following upper endoscopy. CT scan and blood work. The workup was negative for a source of pain or weight loss. Abdominal pain has entirely subsided. Anorexia is improved. She's gained weight taking 4 cans of Ensure daily. She claims that she has been under a great deal of stress and is now feeling better.    Review of Systems: Pertinent positive and negative review of systems were noted in the above HPI section. All other review of systems were otherwise negative.    Current Medications, Allergies, Past Medical History, Past Surgical History, Family History and Social History were reviewed in [employer] electronic medical record  Vital signs were reviewed in today's medical record. Physical Exam: General: Well developed , well nourished, no acute distress

## 2013-02-07 NOTE — Assessment & Plan Note (Signed)
Plan followup colonoscopy 

## 2013-02-07 NOTE — Assessment & Plan Note (Addendum)
Workup for abdominal pain, anorexia weight loss was negative including CT scan, lab work and endoscopy. I suspect that symptoms were stress-related. Symptoms have significantly improved.  Recommendations #1 patient was encouraged to try to eat regular meals and supplement meals with Ensure #2 followup in 8-10 weeks

## 2013-02-07 NOTE — Patient Instructions (Addendum)
Follow up in 2 months

## 2013-02-08 ENCOUNTER — Other Ambulatory Visit: Payer: Self-pay | Admitting: Nurse Practitioner

## 2013-02-08 DIAGNOSIS — N390 Urinary tract infection, site not specified: Secondary | ICD-10-CM

## 2013-02-11 ENCOUNTER — Telehealth: Payer: Self-pay | Admitting: Family Medicine

## 2013-02-11 DIAGNOSIS — G894 Chronic pain syndrome: Secondary | ICD-10-CM

## 2013-02-11 MED ORDER — OXYCODONE-ACETAMINOPHEN 5-325 MG PO TABS: 1.0000 | ORAL_TABLET | Freq: Three times a day (TID) | ORAL | Status: DC | PRN

## 2013-02-11 NOTE — Telephone Encounter (Signed)
Pt aware,rx for lunesta ready. She will call our pharmacist for oxycodone refills.

## 2013-02-12 ENCOUNTER — Other Ambulatory Visit: Payer: Self-pay

## 2013-02-12 DIAGNOSIS — N390 Urinary tract infection, site not specified: Secondary | ICD-10-CM

## 2013-02-12 MED ORDER — NITROFURANTOIN MACROCRYSTAL 50 MG PO CAPS: 50.0000 mg | ORAL_CAPSULE | Freq: Two times a day (BID) | ORAL | Status: DC

## 2013-02-12 NOTE — Telephone Encounter (Signed)
Last seen 01/08/13   Last filled 12/06/12

## 2013-02-14 ENCOUNTER — Ambulatory Visit (INDEPENDENT_AMBULATORY_CARE_PROVIDER_SITE_OTHER): Payer: Medicare Other | Admitting: Pharmacist

## 2013-02-14 VITALS — BP 133/70 | HR 78 | Ht 66.0 in | Wt 123.0 lb

## 2013-02-14 DIAGNOSIS — G8929 Other chronic pain: Secondary | ICD-10-CM

## 2013-02-14 DIAGNOSIS — G894 Chronic pain syndrome: Secondary | ICD-10-CM

## 2013-02-14 DIAGNOSIS — E785 Hyperlipidemia, unspecified: Secondary | ICD-10-CM

## 2013-02-14 DIAGNOSIS — M25519 Pain in unspecified shoulder: Secondary | ICD-10-CM

## 2013-02-14 MED ORDER — ALPRAZOLAM 0.5 MG PO TABS: ORAL_TABLET | ORAL | Status: DC

## 2013-02-14 MED ORDER — PREGABALIN 75 MG PO CAPS: 75.0000 mg | ORAL_CAPSULE | Freq: Two times a day (BID) | ORAL | Status: DC

## 2013-02-14 MED ORDER — EZETIMIBE-ATORVASTATIN 10-20 MG PO TABS: 1.0000 | ORAL_TABLET | Freq: Every day | ORAL | Status: DC

## 2013-02-14 MED ORDER — OXYCODONE-ACETAMINOPHEN 5-325 MG PO TABS: 1.0000 | ORAL_TABLET | Freq: Three times a day (TID) | ORAL | Status: DC | PRN

## 2013-02-14 NOTE — Progress Notes (Signed)
Subjective:    Cassandra [name] is a 57 y.o. female who presents for follow up of chronic pain.  Nature of the pain: chronic shoulder pain and occasional neuropathy in arm and legs Location of the pain: Left shoulder  Intensity: currently 7/10,  Best is 5/10 relieving factors: heat, hot bath, medication Adverse effects of medications:  Constipation 1 times per week - relieved with dulcolax  Current medications for pain: oxycodone/APAP 5/325mg  1 tablet q 8 hours as needed (last rx 01/11/13)  Lyrica 75mg  1 capsule qd - bid  Lidocaine patches - applied to area of pain for 12 hours, then remove for 12 hours  Uses TENS unit     Last urine drug screen from 01/12/13 was as expected   Assessment:    Assessment: chronic shoulder pain .    Plan:     Continue percocet 5/325mg  1 tablet q8h as needed - rx given to patient today for #90 (Rx from 02/12/13 for #30 tablets was voided and destroyed)      Recommended increase light exercise like walking as tolerated.    Referral to pain clinic - Dr Laurian Brim for treatment

## 2013-03-07 ENCOUNTER — Telehealth: Payer: Self-pay | Admitting: Pharmacist

## 2013-03-07 DIAGNOSIS — G894 Chronic pain syndrome: Secondary | ICD-10-CM

## 2013-03-07 NOTE — Telephone Encounter (Signed)
Spoke with patient.  Today is a little too early to fill her prescriptions - she is not out.   Will wait until first of next week to get Rx's

## 2013-03-11 ENCOUNTER — Other Ambulatory Visit: Payer: Self-pay | Admitting: Family Medicine

## 2013-03-11 DIAGNOSIS — G894 Chronic pain syndrome: Secondary | ICD-10-CM

## 2013-03-11 MED ORDER — PREGABALIN 75 MG PO CAPS: 75.0000 mg | ORAL_CAPSULE | Freq: Two times a day (BID) | ORAL | Status: DC

## 2013-03-11 MED ORDER — ALPRAZOLAM 0.5 MG PO TABS: ORAL_TABLET | ORAL | Status: DC

## 2013-03-11 MED ORDER — OXYCODONE-ACETAMINOPHEN 5-325 MG PO TABS: 1.0000 | ORAL_TABLET | Freq: Three times a day (TID) | ORAL | Status: DC | PRN

## 2013-03-11 NOTE — Telephone Encounter (Signed)
rx printed - can fill 03/14/13

## 2013-03-12 ENCOUNTER — Other Ambulatory Visit: Payer: Self-pay | Admitting: *Deleted

## 2013-03-12 MED ORDER — ESZOPICLONE 3 MG PO TABS: ORAL_TABLET | ORAL | Status: DC

## 2013-03-12 NOTE — Telephone Encounter (Signed)
[facility].

## 2013-03-12 NOTE — Telephone Encounter (Signed)
Please call into pharmacy. thx 

## 2013-03-12 NOTE — Telephone Encounter (Signed)
LAST RF 02/14/13. CALL IN [facility] IF APPROVED.

## 2013-03-14 ENCOUNTER — Other Ambulatory Visit: Payer: Self-pay | Admitting: *Deleted

## 2013-03-14 MED ORDER — MECLIZINE HCL 25 MG PO TABS: ORAL_TABLET | ORAL | Status: DC

## 2013-03-14 MED ORDER — ESCITALOPRAM OXALATE 10 MG PO TABS: ORAL_TABLET | ORAL | Status: DC

## 2013-04-15 ENCOUNTER — Telehealth: Payer: Self-pay | Admitting: Pharmacist

## 2013-04-15 DIAGNOSIS — G894 Chronic pain syndrome: Secondary | ICD-10-CM

## 2013-04-15 DIAGNOSIS — F411 Generalized anxiety disorder: Secondary | ICD-10-CM

## 2013-04-15 MED ORDER — PREGABALIN 75 MG PO CAPS: 75.0000 mg | ORAL_CAPSULE | Freq: Two times a day (BID) | ORAL | Status: DC

## 2013-04-15 MED ORDER — OXYCODONE-ACETAMINOPHEN 5-325 MG PO TABS: 1.0000 | ORAL_TABLET | Freq: Three times a day (TID) | ORAL | Status: DC | PRN

## 2013-04-15 MED ORDER — ALPRAZOLAM 0.5 MG PO TABS: ORAL_TABLET | ORAL | Status: DC

## 2013-04-15 NOTE — Telephone Encounter (Signed)
Made referral to Dr Laurian Brim for evaluation of chronic pain.  It looks like their office tried to call her in June.  I asked patient and she said they did call in June but told her they would call back to make appt once they received her records. She has not heard from them.   Will forward this message to Carlon in our referral office to see what is going on.

## 2013-04-17 ENCOUNTER — Other Ambulatory Visit: Payer: Self-pay | Admitting: Nurse Practitioner

## 2013-04-18 ENCOUNTER — Other Ambulatory Visit: Payer: Self-pay | Admitting: *Deleted

## 2013-04-18 MED ORDER — ESZOPICLONE 3 MG PO TABS: ORAL_TABLET | ORAL | Status: DC

## 2013-04-18 NOTE — Telephone Encounter (Signed)
Patient last seen in office on 02-14-13 with Tammy. Please advise. No other office visits in epic. Please advise. If approved please route to Pool B so [facility]

## 2013-04-18 NOTE — Telephone Encounter (Signed)
Patient requesting refill. Last seen in office on 02-14-13 with Tammy. Please advise. No visit with you in Epic.

## 2013-04-22 ENCOUNTER — Encounter: Payer: Self-pay | Admitting: Gastroenterology

## 2013-05-16 ENCOUNTER — Encounter: Payer: Self-pay | Admitting: General Practice

## 2013-05-16 ENCOUNTER — Ambulatory Visit (INDEPENDENT_AMBULATORY_CARE_PROVIDER_SITE_OTHER): Payer: Medicare Other | Admitting: General Practice

## 2013-05-16 VITALS — BP 145/77 | HR 75 | Temp 97.3°F | Ht 66.0 in | Wt 129.0 lb

## 2013-05-16 DIAGNOSIS — R82998 Other abnormal findings in urine: Secondary | ICD-10-CM

## 2013-05-16 DIAGNOSIS — G894 Chronic pain syndrome: Secondary | ICD-10-CM

## 2013-05-16 DIAGNOSIS — G4700 Insomnia, unspecified: Secondary | ICD-10-CM

## 2013-05-16 DIAGNOSIS — F411 Generalized anxiety disorder: Secondary | ICD-10-CM

## 2013-05-16 DIAGNOSIS — R8290 Unspecified abnormal findings in urine: Secondary | ICD-10-CM

## 2013-05-16 DIAGNOSIS — Z8639 Personal history of other endocrine, nutritional and metabolic disease: Secondary | ICD-10-CM

## 2013-05-16 DIAGNOSIS — Z862 Personal history of diseases of the blood and blood-forming organs and certain disorders involving the immune mechanism: Secondary | ICD-10-CM

## 2013-05-16 DIAGNOSIS — Z0000 Encounter for general adult medical examination without abnormal findings: Secondary | ICD-10-CM

## 2013-05-16 LAB — POCT CBC
Granulocyte percent: 63.3 %G (ref 37–80)
Lymph, poc: 2.6 (ref 0.6–3.4)
MCV: 92.8 fL (ref 80–97)
MPV: 7.8 fL (ref 0–99.8)
Platelet Count, POC: 300 10*3/uL (ref 142–424)
RBC: 5.2 M/uL (ref 4.04–5.48)

## 2013-05-16 LAB — POCT UA - MICROSCOPIC ONLY: RBC, urine, microscopic: NEGATIVE

## 2013-05-16 LAB — POCT URINALYSIS DIPSTICK
Glucose, UA: NEGATIVE
Leukocytes, UA: NEGATIVE
Nitrite, UA: NEGATIVE
Spec Grav, UA: <=1.005
Urobilinogen, UA: NEGATIVE

## 2013-05-16 MED ORDER — ESZOPICLONE 3 MG PO TABS: ORAL_TABLET | ORAL | Status: DC

## 2013-05-16 MED ORDER — OXYCODONE-ACETAMINOPHEN 5-325 MG PO TABS: 1.0000 | ORAL_TABLET | Freq: Three times a day (TID) | ORAL | Status: DC | PRN

## 2013-05-16 MED ORDER — ALPRAZOLAM 0.5 MG PO TABS: ORAL_TABLET | ORAL | Status: DC

## 2013-05-16 NOTE — Progress Notes (Signed)
  Subjective:    Patient ID: Cassandra [name], female    DOB: 12-09-1955, 57 y.o.   MRN: [id number]  HPI Patient presents today for follow up of chronic health conditions. She has a history of chronic left shoulder pain, anxiety, depression, insomnia, vertigo, and recurrent urinary tract infections. She reports having headaches (generalized) which began two to three weeks ago. She reports being stressed and feels it may be related. She reports smoking cigarettes (1ppd) and interested in smoking cessation. Reports trying chantix in the past and had nausea/ vivid dreams. She reports having marital issues and denies counseling at this time.    Review of Systems  Constitutional: Negative for fever and chills.  Respiratory: Negative for chest tightness, shortness of breath and wheezing.   Cardiovascular: Negative for chest pain and palpitations.  Gastrointestinal: Negative for nausea, vomiting, abdominal pain and blood in stool.  Genitourinary: Negative for dysuria, hematuria and difficulty urinating.  Neurological: Positive for dizziness. Negative for weakness and headaches.       Objective:   Physical Exam  Constitutional: She is oriented to person, place, and time. She appears well-developed and well-nourished.  Cardiovascular: Normal rate, regular rhythm and normal heart sounds.   Pulmonary/Chest: Effort normal and breath sounds normal. No respiratory distress. She exhibits no tenderness.  Abdominal: Soft. Bowel sounds are normal. She exhibits no distension. There is no tenderness.  Neurological: She is alert and oriented to person, place, and time.  Skin: Skin is warm and dry.  Psychiatric: She has a normal mood and affect.          Assessment & Plan:  1. Anxiety state, unspecified - ALPRAZolam (XANAX) 0.5 MG tablet; Take one tablet twice a day as needed for anxiety.  Dispense: 60 tablet; Refill: 0  2. Chronic pain syndrome - oxyCODONE-acetaminophen (PERCOCET) 5-325 MG per tablet;  Take 1 tablet by mouth every 8 (eight) hours as needed for pain.  Dispense: 90 tablet; Refill: 0  3. Insomnia - Eszopiclone (ESZOPICLONE) 3 MG TABS; TAKE ONE TABLET AT BEDTIME AS NEEDED  Dispense: 30 tablet; Refill: 0  4. History of hyperlipidemia - NMR, lipoprofile  5. Bad odor of urine - POCT urinalysis dipstick - POCT UA - Microscopic Only  6. Annual physical exam - POCT CBC - CMP14+EGFR -Continue all current medications Labs pending F/u in 3 months Discussed exercise and diet  Patient verbalized understanding Coralie Keens, FNP-C

## 2013-05-16 NOTE — Patient Instructions (Signed)
Shoulder Pain  The shoulder is the joint that connects your arms to your body. The bones that form the shoulder joint include the upper arm bone (humerus), the shoulder blade (scapula), and the collarbone (clavicle). The top of the humerus is shaped like a ball and fits into a rather flat socket on the scapula (glenoid cavity). A combination of muscles and strong, fibrous tissues that connect muscles to bones (tendons) support your shoulder joint and hold the ball in the socket. Small, fluid-filled sacs (bursae) are located in different areas of the joint. They act as cushions between the bones and the overlying soft tissues and help reduce friction between the gliding tendons and the bone as you move your arm. Your shoulder joint allows a wide range of motion in your arm. This range of motion allows you to do things like scratch your back or throw a ball. However, this range of motion also makes your shoulder more prone to pain from overuse and injury.  Causes of shoulder pain can originate from both injury and overuse and usually can be grouped in the following four categories:   Redness, swelling, and pain (inflammation) of the tendon (tendinitis) or the bursae (bursitis).   Instability, such as a dislocation of the joint.   Inflammation of the joint (arthritis).   Broken bone (fracture).  HOME CARE INSTRUCTIONS    Apply ice to the sore area.   Put ice in a plastic bag.   Place a towel between your skin and the bag.   Leave the ice on for 15-20 minutes, 3-4 times per day for the first 2 days.   Stop using cold packs if they do not help with the pain.   If you have a shoulder sling or immobilizer, wear it as long as your caregiver instructs. Only remove it to shower or bathe. Move your arm as little as possible, but keep your hand moving to prevent swelling.   Squeeze a soft ball or foam pad as much as possible to help prevent swelling.   Only take over-the-counter or prescription medicines for pain,  discomfort, or fever as directed by your caregiver.  SEEK MEDICAL CARE IF:    Your shoulder pain increases, or new pain develops in your arm, hand, or fingers.   Your hand or fingers become cold and numb.   Your pain is not relieved with medicines.  SEEK IMMEDIATE MEDICAL CARE IF:    Your arm, hand, or fingers are numb or tingling.   Your arm, hand, or fingers are significantly swollen or turn white or blue.  MAKE SURE YOU:    Understand these instructions.   Will watch your condition.   Will get help right away if you are not doing well or get worse.  Document Released: 06/08/2005 Document Revised: 05/23/2012 Document Reviewed: 08/13/2011  [employer] Patient Information 2014 [employer].

## 2013-05-17 ENCOUNTER — Other Ambulatory Visit: Payer: Self-pay | Admitting: Nurse Practitioner

## 2013-05-18 LAB — CMP14+EGFR
Albumin/Globulin Ratio: 2 (ref 1.1–2.5)
Albumin: 4.7 g/dL (ref 3.5–5.5)
BUN: 11 mg/dL (ref 6–24)
GFR calc Af Amer: 109 mL/min/{1.73_m2} (ref >59)
GFR calc non Af Amer: 95 mL/min/{1.73_m2} (ref >59)
Glucose: 95 mg/dL (ref 65–99)
Total Bilirubin: 0.2 mg/dL (ref 0.0–1.2)
Total Protein: 7.1 g/dL (ref 6.0–8.5)

## 2013-05-18 LAB — NMR, LIPOPROFILE
HDL Particle Number: 33.3 umol/L (ref >=30.5)
LDLC SERPL CALC-MCNC: 163 mg/dL — ABNORMAL HIGH (ref <100)
LP-IR Score: <25 (ref <=45)

## 2013-05-21 NOTE — Telephone Encounter (Signed)
It looks like her u/a was normal?

## 2013-05-23 ENCOUNTER — Other Ambulatory Visit: Payer: Self-pay | Admitting: *Deleted

## 2013-05-23 MED ORDER — ESCITALOPRAM OXALATE 10 MG PO TABS: ORAL_TABLET | ORAL | Status: DC

## 2013-05-23 NOTE — Telephone Encounter (Signed)
LAST OV 9/14 FOR SHOULDER PAIN.

## 2013-05-24 ENCOUNTER — Other Ambulatory Visit: Payer: Self-pay | Admitting: General Practice

## 2013-05-24 DIAGNOSIS — E785 Hyperlipidemia, unspecified: Secondary | ICD-10-CM

## 2013-05-24 MED ORDER — EZETIMIBE-ATORVASTATIN 10-20 MG PO TABS
1.0000 | ORAL_TABLET | Freq: Every day | ORAL | Status: DC
Start: 2013-05-24 — End: 2014-01-15

## 2013-06-13 ENCOUNTER — Other Ambulatory Visit: Payer: Self-pay | Admitting: Nurse Practitioner

## 2013-06-13 ENCOUNTER — Telehealth: Payer: Self-pay | Admitting: General Practice

## 2013-06-13 DIAGNOSIS — G894 Chronic pain syndrome: Secondary | ICD-10-CM

## 2013-06-13 MED ORDER — OXYCODONE-ACETAMINOPHEN 5-325 MG PO TABS: 1.0000 | ORAL_TABLET | Freq: Three times a day (TID) | ORAL | Status: DC | PRN

## 2013-06-13 MED ORDER — PREGABALIN 75 MG PO CAPS: 75.0000 mg | ORAL_CAPSULE | Freq: Two times a day (BID) | ORAL | Status: DC

## 2013-06-13 NOTE — Telephone Encounter (Signed)
Rx's left at front desk from oxycodone/APAP 5/325mg  1t po tid #90 and lyrica. Patient called.  I also asked Cassandra [name] to check into where were are with the referral to Dr. Laurian Brim??? More info needed? Denied?

## 2013-06-14 ENCOUNTER — Other Ambulatory Visit: Payer: Self-pay | Admitting: General Practice

## 2013-06-14 DIAGNOSIS — F411 Generalized anxiety disorder: Secondary | ICD-10-CM

## 2013-06-14 MED ORDER — ALPRAZOLAM 0.5 MG PO TABS: ORAL_TABLET | ORAL | Status: DC

## 2013-06-17 ENCOUNTER — Other Ambulatory Visit: Payer: Self-pay

## 2013-06-17 NOTE — Telephone Encounter (Signed)
Last seen 05/16/13 [facility]   

## 2013-06-18 MED ORDER — NITROFURANTOIN MACROCRYSTAL 50 MG PO CAPS: 50.0000 mg | ORAL_CAPSULE | Freq: Two times a day (BID) | ORAL | Status: DC

## 2013-07-09 ENCOUNTER — Ambulatory Visit (INDEPENDENT_AMBULATORY_CARE_PROVIDER_SITE_OTHER): Payer: Medicare Other

## 2013-07-09 DIAGNOSIS — Z23 Encounter for immunization: Secondary | ICD-10-CM

## 2013-07-15 ENCOUNTER — Telehealth: Payer: Self-pay | Admitting: General Practice

## 2013-07-18 ENCOUNTER — Other Ambulatory Visit: Payer: Self-pay | Admitting: General Practice

## 2013-07-18 DIAGNOSIS — G894 Chronic pain syndrome: Secondary | ICD-10-CM

## 2013-07-18 DIAGNOSIS — F411 Generalized anxiety disorder: Secondary | ICD-10-CM

## 2013-07-18 MED ORDER — ALPRAZOLAM 0.5 MG PO TABS: ORAL_TABLET | ORAL | Status: DC

## 2013-07-18 MED ORDER — OXYCODONE-ACETAMINOPHEN 5-325 MG PO TABS: 1.0000 | ORAL_TABLET | Freq: Two times a day (BID) | ORAL | Status: DC | PRN

## 2013-07-18 MED ORDER — PREGABALIN 75 MG PO CAPS: 75.0000 mg | ORAL_CAPSULE | Freq: Two times a day (BID) | ORAL | Status: DC

## 2013-07-18 NOTE — Telephone Encounter (Signed)
Please inform that scripts are ready for pick up. Another pain referral has been made.

## 2013-07-19 ENCOUNTER — Other Ambulatory Visit: Payer: Self-pay

## 2013-07-19 DIAGNOSIS — G4700 Insomnia, unspecified: Secondary | ICD-10-CM

## 2013-07-19 DIAGNOSIS — F411 Generalized anxiety disorder: Secondary | ICD-10-CM

## 2013-07-19 NOTE — Telephone Encounter (Signed)
Last seen 05/16/13  mae 

## 2013-07-19 NOTE — Telephone Encounter (Signed)
Patient Cassandra [name]

## 2013-07-19 NOTE — Telephone Encounter (Signed)
Last seen 05/16/13  Cassandra [name]  If approved route to [facility]

## 2013-07-22 ENCOUNTER — Other Ambulatory Visit: Payer: Self-pay | Admitting: General Practice

## 2013-07-22 DIAGNOSIS — G4700 Insomnia, unspecified: Secondary | ICD-10-CM

## 2013-07-22 MED ORDER — ESZOPICLONE 3 MG PO TABS: ORAL_TABLET | ORAL | Status: DC

## 2013-07-23 ENCOUNTER — Other Ambulatory Visit: Payer: Self-pay

## 2013-07-23 NOTE — Telephone Encounter (Signed)
Last seen 05/16/13 [facility]   

## 2013-07-24 ENCOUNTER — Other Ambulatory Visit: Payer: Self-pay | Admitting: General Practice

## 2013-07-24 NOTE — Telephone Encounter (Signed)
All scripts printed on 07/18/13

## 2013-07-25 ENCOUNTER — Telehealth: Payer: Self-pay | Admitting: Pharmacist

## 2013-07-25 ENCOUNTER — Other Ambulatory Visit: Payer: Self-pay | Admitting: *Deleted

## 2013-07-25 MED ORDER — ESCITALOPRAM OXALATE 10 MG PO TABS: ORAL_TABLET | ORAL | Status: DC

## 2013-07-25 NOTE — Telephone Encounter (Signed)
LAST OV 05/16/13. LAST RF 06/18/13

## 2013-07-25 NOTE — Telephone Encounter (Signed)
Patient calls because she requested eszopiclone on Monday but [facility] states [facility] have not received response.  It looks like Rx was ok by [employer].  [facility] today with instructions that if they received the rx from 07/22/13 to disregard my message.  Also patient requested refill on lexapro - [facility].

## 2013-07-26 MED ORDER — NITROFURANTOIN MACROCRYSTAL 50 MG PO CAPS: 50.0000 mg | ORAL_CAPSULE | Freq: Two times a day (BID) | ORAL | Status: DC

## 2013-08-01 ENCOUNTER — Encounter: Payer: Self-pay | Admitting: Physical Medicine & Rehabilitation

## 2013-08-19 ENCOUNTER — Ambulatory Visit (INDEPENDENT_AMBULATORY_CARE_PROVIDER_SITE_OTHER): Payer: Medicare Other | Admitting: General Practice

## 2013-08-19 ENCOUNTER — Encounter: Payer: Self-pay | Admitting: General Practice

## 2013-08-19 VITALS — BP 136/87 | HR 108 | Temp 97.6°F | Ht 66.0 in | Wt 134.0 lb

## 2013-08-19 DIAGNOSIS — G894 Chronic pain syndrome: Secondary | ICD-10-CM

## 2013-08-19 DIAGNOSIS — M25519 Pain in unspecified shoulder: Secondary | ICD-10-CM

## 2013-08-19 DIAGNOSIS — F411 Generalized anxiety disorder: Secondary | ICD-10-CM

## 2013-08-19 DIAGNOSIS — M25512 Pain in left shoulder: Secondary | ICD-10-CM

## 2013-08-19 MED ORDER — PREGABALIN 75 MG PO CAPS: 75.0000 mg | ORAL_CAPSULE | Freq: Two times a day (BID) | ORAL | Status: DC

## 2013-08-19 MED ORDER — OXYCODONE-ACETAMINOPHEN 5-325 MG PO TABS: 1.0000 | ORAL_TABLET | Freq: Two times a day (BID) | ORAL | Status: DC | PRN

## 2013-08-19 MED ORDER — ALPRAZOLAM 0.5 MG PO TABS: ORAL_TABLET | ORAL | Status: DC

## 2013-08-19 NOTE — Patient Instructions (Signed)
   Shoulder Pain The shoulder is the joint that connects your arms to your body. The bones that form the shoulder joint include the upper arm bone (humerus), the shoulder blade (scapula), and the collarbone (clavicle). The top of the humerus is shaped like a ball and fits into a rather flat socket on the scapula (glenoid cavity). A combination of muscles and strong, fibrous tissues that connect muscles to bones (tendons) support your shoulder joint and hold the ball in the socket. Small, fluid-filled sacs (bursae) are located in different areas of the joint. They act as cushions between the bones and the overlying soft tissues and help reduce friction between the gliding tendons and the bone as you move your arm. Your shoulder joint allows a wide range of motion in your arm. This range of motion allows you to do things like scratch your back or throw a ball. However, this range of motion also makes your shoulder more prone to pain from overuse and injury. Causes of shoulder pain can originate from both injury and overuse and usually can be grouped in the following four categories:  Redness, swelling, and pain (inflammation) of the tendon (tendinitis) or the bursae (bursitis).  Instability, such as a dislocation of the joint.  Inflammation of the joint (arthritis).  Broken bone (fracture). HOME CARE INSTRUCTIONS   Apply ice to the sore area.  Put ice in a plastic bag.  Place a towel between your skin and the bag.  Leave the ice on for 15-20 minutes, 03-04 times per day for the first 2 days.  Stop using cold packs if they do not help with the pain.  If you have a shoulder sling or immobilizer, wear it as long as your caregiver instructs. Only remove it to shower or bathe. Move your arm as little as possible, but keep your hand moving to prevent swelling.  Squeeze a soft ball or foam pad as much as possible to help prevent swelling.  Only take over-the-counter or prescription medicines for  pain, discomfort, or fever as directed by your caregiver. SEEK MEDICAL CARE IF:   Your shoulder pain increases, or new pain develops in your arm, hand, or fingers.  Your hand or fingers become cold and numb.  Your pain is not relieved with medicines. SEEK IMMEDIATE MEDICAL CARE IF:   Your arm, hand, or fingers are numb or tingling.  Your arm, hand, or fingers are significantly swollen or turn white or blue. MAKE SURE YOU:   Understand these instructions.  Will watch your condition.  Will get help right away if you are not doing well or get worse. Document Released: 06/08/2005 Document Revised: 05/23/2012 Document Reviewed: 08/13/2011 Exit[employer] Patient Information 2014 [employer].  

## 2013-08-19 NOTE — Progress Notes (Signed)
   Subjective:    Patient ID: Cassandra [name], female    DOB: 1956/06/28, 57 y.o.   MRN: [id number]  HPI Patient presents today with complaints of chronic left shoulder pain and questioning why percocet was decreased. Patient reports she is scheduled to see Dr. Hermelinda Medicus on Janauary 9, 2015 for pain management.     Review of Systems  Constitutional: Negative for fever and chills.  Respiratory: Negative for chest tightness and shortness of breath.   Cardiovascular: Negative for chest pain and palpitations.  Musculoskeletal:       Left shoulder pain  Neurological: Negative for dizziness, weakness and headaches.       Objective:   Physical Exam  Constitutional: She is oriented to person, place, and time. She appears well-developed and well-nourished.  Cardiovascular: Normal rate and normal heart sounds.   Pulmonary/Chest: Effort normal and breath sounds normal. No respiratory distress. She exhibits no tenderness.  Musculoskeletal: She exhibits tenderness.  Left shoulder, limited range of motion  Neurological: She is alert and oriented to person, place, and time.  Skin: Skin is warm and dry.  Psychiatric: She has a normal mood and affect.          Assessment & Plan:  1. Left shoulder pain and 2. Chronic pain syndrome  - oxyCODONE-acetaminophen (PERCOCET) 5-325 MG per tablet; Take 1 tablet by mouth every 12 (twelve) hours as needed.  Dispense: 60 tablet; Refill: 0 - pregabalin (LYRICA) 75 MG capsule; Take 1 capsule (75 mg total) by mouth 2 (two) times daily. Take 1 or 2 capsule daily  Dispense: 60 capsule; Refill: 1  3. Anxiety state, unspecified  - ALPRAZolam (XANAX) 0.5 MG tablet; Take one tablet twice a day as needed for anxiety.  Dispense: 60 tablet; Refill: 0 -discussed importance of maintaining appointment with pain management clinic -Patient verbalized understanding Coralie Keens, FNP-C

## 2013-08-23 ENCOUNTER — Other Ambulatory Visit: Payer: Self-pay

## 2013-08-23 NOTE — Telephone Encounter (Signed)
Last seen 08/19/13  mae 

## 2013-08-26 ENCOUNTER — Other Ambulatory Visit: Payer: Self-pay

## 2013-08-26 NOTE — Telephone Encounter (Signed)
Last seen 08/19/13  Cassandra [name]

## 2013-08-27 ENCOUNTER — Telehealth: Payer: Self-pay | Admitting: General Practice

## 2013-08-28 NOTE — Telephone Encounter (Signed)
Patient was told by urologist to stay on medication for bladder since she had the surgery to place a sling in.  She is aware we will only fill script if she has an appointment and urine is positive for infection.  She was advised to follow up with specialist if she has an on going problem.  She has also requested a form for disability be signed and sent to insurance for continual benefits. The form was left with [facility] and needs to be sent in by December 19th,2014.

## 2013-08-28 NOTE — Telephone Encounter (Signed)
Patient needs to be seen, if she feels like a uti is present.

## 2013-08-30 ENCOUNTER — Other Ambulatory Visit: Payer: Self-pay | Admitting: General Practice

## 2013-08-30 MED ORDER — MECLIZINE HCL 25 MG PO TABS: ORAL_TABLET | ORAL | Status: DC

## 2013-09-02 NOTE — Telephone Encounter (Signed)
Form faxed

## 2013-09-20 ENCOUNTER — Other Ambulatory Visit: Payer: Self-pay | Admitting: General Practice

## 2013-09-20 ENCOUNTER — Telehealth: Payer: Self-pay | Admitting: Physical Medicine & Rehabilitation

## 2013-09-20 ENCOUNTER — Encounter: Payer: Self-pay | Admitting: Physical Medicine & Rehabilitation

## 2013-09-20 ENCOUNTER — Ambulatory Visit
Admission: RE | Admit: 2013-09-20 | Discharge: 2013-09-20 | Disposition: A | Discharge status: Home / Self Care | Payer: Medicare Other | Source: Ambulatory Visit | Attending: Physical Medicine & Rehabilitation | Admitting: Physical Medicine & Rehabilitation

## 2013-09-20 ENCOUNTER — Telehealth: Payer: Self-pay

## 2013-09-20 ENCOUNTER — Encounter: Payer: Medicare Other | Attending: Physical Medicine & Rehabilitation | Admitting: Physical Medicine & Rehabilitation

## 2013-09-20 VITALS — BP 153/75 | HR 98 | Resp 14 | Ht 66.0 in | Wt 133.0 lb

## 2013-09-20 DIAGNOSIS — M47812 Spondylosis without myelopathy or radiculopathy, cervical region: Secondary | ICD-10-CM | POA: Insufficient documentation

## 2013-09-20 DIAGNOSIS — G894 Chronic pain syndrome: Secondary | ICD-10-CM

## 2013-09-20 DIAGNOSIS — IMO0001 Reserved for inherently not codable concepts without codable children: Secondary | ICD-10-CM

## 2013-09-20 DIAGNOSIS — M542 Cervicalgia: Secondary | ICD-10-CM

## 2013-09-20 DIAGNOSIS — F329 Major depressive disorder, single episode, unspecified: Secondary | ICD-10-CM | POA: Insufficient documentation

## 2013-09-20 DIAGNOSIS — Z79899 Other long term (current) drug therapy: Secondary | ICD-10-CM

## 2013-09-20 DIAGNOSIS — M7918 Myalgia, other site: Secondary | ICD-10-CM

## 2013-09-20 DIAGNOSIS — M12519 Traumatic arthropathy, unspecified shoulder: Secondary | ICD-10-CM | POA: Insufficient documentation

## 2013-09-20 DIAGNOSIS — M7631 Iliotibial band syndrome, right leg: Secondary | ICD-10-CM | POA: Insufficient documentation

## 2013-09-20 DIAGNOSIS — M7500 Adhesive capsulitis of unspecified shoulder: Secondary | ICD-10-CM

## 2013-09-20 DIAGNOSIS — M629 Disorder of muscle, unspecified: Secondary | ICD-10-CM

## 2013-09-20 DIAGNOSIS — F3289 Other specified depressive episodes: Secondary | ICD-10-CM | POA: Insufficient documentation

## 2013-09-20 DIAGNOSIS — G4700 Insomnia, unspecified: Secondary | ICD-10-CM | POA: Insufficient documentation

## 2013-09-20 DIAGNOSIS — M2420 Disorder of ligament, unspecified site: Secondary | ICD-10-CM | POA: Insufficient documentation

## 2013-09-20 DIAGNOSIS — Z5181 Encounter for therapeutic drug level monitoring: Secondary | ICD-10-CM

## 2013-09-20 DIAGNOSIS — M19019 Primary osteoarthritis, unspecified shoulder: Secondary | ICD-10-CM

## 2013-09-20 DIAGNOSIS — M7501 Adhesive capsulitis of right shoulder: Secondary | ICD-10-CM

## 2013-09-20 DIAGNOSIS — M25512 Pain in left shoulder: Secondary | ICD-10-CM

## 2013-09-20 DIAGNOSIS — M7502 Adhesive capsulitis of left shoulder: Secondary | ICD-10-CM | POA: Insufficient documentation

## 2013-09-20 DIAGNOSIS — M19012 Primary osteoarthritis, left shoulder: Secondary | ICD-10-CM | POA: Insufficient documentation

## 2013-09-20 DIAGNOSIS — F411 Generalized anxiety disorder: Secondary | ICD-10-CM

## 2013-09-20 HISTORY — DX: Iliotibial band syndrome, right leg: M76.31

## 2013-09-20 MED ORDER — METHOCARBAMOL 500 MG PO TABS: 500.0000 mg | ORAL_TABLET | Freq: Four times a day (QID) | ORAL | Status: DC | PRN

## 2013-09-20 MED ORDER — OXYCODONE-ACETAMINOPHEN 5-325 MG PO TABS: 1.0000 | ORAL_TABLET | Freq: Two times a day (BID) | ORAL | Status: DC | PRN

## 2013-09-20 MED ORDER — ALPRAZOLAM 0.5 MG PO TABS: ORAL_TABLET | ORAL | Status: DC

## 2013-09-20 MED ORDER — NAPROXEN 500 MG PO TABS: 500.0000 mg | ORAL_TABLET | Freq: Two times a day (BID) | ORAL | Status: DC

## 2013-09-20 NOTE — Telephone Encounter (Signed)
Patient informed of imaging results.  She agrees with therapy and will contact the office when she figures out the name of the facility she would like to go to.

## 2013-09-20 NOTE — Progress Notes (Signed)
Subjective:    Patient ID: Cassandra [name], female    DOB: 1955/09/14, 58 y.o.   MRN: [id number]  HPI  This is an initial visit for Cassandra [name] who is a pleasant 58 yo female who's had pain since 2006 when a case of napkins fell on her left shoulder. She had two separate shoulders surgeries by Dr. Onnie Graham which involved "cutting the clavicle and screws" per pt.  The first surgery was in October of 2006 and the second was in the winter of 2007. She had therapy for a period of time until it was evident that the pain wasn't improving and that she was plateauing. Since then she has been struggling with pain. She had two stellate ganglion blocks which were ineffective.  Hot water makes the shoulder feel better. The percocet makes her pain better (she usually takes 2 per day---which she says "isn't enough". (3 a day would be better.)  She is also taking lyrica 75mg  daily. She hasn't been on any muscle relaxants.   More recently over the last two months she has developed low back which radiates down the right leg. The pain will radiated down the lateral leg to the right knee.  The pain comes and goes without any obvious cause.   She has never had an xrays of her lumbar spine or neck.   Her sleep is poor. She MAY sleep 2-4 hours per night. It is usually broken  She is on lexapro and xanax for anxiety and depression. Her mood has been since being on the meds but she's had a very stressful year.  Bladder issues related to a sling with erosion into her vaginal area. She had been on prophylactic macrodantin until last month.  Pain Inventory Average Pain 6 Pain Right Now 6 My pain is sharp, burning, dull, tingling and aching  In the last 24 hours, has pain interfered with the following? General activity 7 Relation with others 7 Enjoyment of life 3 What TIME of day is your pain at its worst? varies Sleep (in general) Poor  Pain is worse with: bending, sitting, inactivity, standing and some  activites Pain improves with: rest, heat/ice and medication Relief from Meds: 3  Mobility walk without assistance how many minutes can you walk? anytime ability to climb steps?  yes do you drive?  yes  Function not employed: date last employed 06/2005 disabled: date disabled 06/2006 I need assistance with the following:  dressing, household duties and shopping  Neuro/Psych weakness numbness tremor tingling spasms dizziness confusion depression anxiety suicidal thoughts  Prior Studies Any changes since last visit?  yes x-rays CT/MRI nerve study  Physicians involved in your care Any changes since last visit?  no   Family History  Problem Relation Age of Onset  . Coronary artery disease Mother   . Diabetes Mother     borderline  . Coronary artery disease Father   . Prostate cancer Father   . Liver cancer Maternal Grandmother   . Pancreatic cancer Father   . Colon cancer      Both sides  . Liver disease Maternal Grandmother   . Kidney disease Mother     stage 4   History   Social History  . Marital Status: Married    Spouse Name: N/A    Number of Children: 1  . Years of Education: N/A   Occupational History  . Homemaker    Social History Main Topics  . Smoking status: Current Every Day Smoker --  1.00 packs/day    Types: Cigarettes  . Smokeless tobacco: Never Used  . Alcohol Use: No  . Drug Use: No  . Sexual Activity: None   Other Topics Concern  . None   Social History Narrative  . None   Past Surgical History  Procedure Laterality Date  . Abdominal hysterectomy      age 53  . Bladder repair      sling  . Joint replacement      /knee  . Shoulder arthroscopy      multiple   Past Medical History  Diagnosis Date  . Nicotine addiction     2 packs per day   . Hyperlipidemia   . Chronic left shoulder pain     frozen   . Tubular adenoma of colon 01/31/2000    Dr. Sharrell Ku  . Maternal complication related to childbirth      Sepsis, staph  . Skin cancer     Rt. leg, squamous cell  . Anxiety   . Depression   . COPD (chronic obstructive pulmonary disease)     Dr. Rudi Heap   BP 153/75  Pulse 98  Resp 14  Ht  (1.676 m)  Wt 133 lb (60.328 kg)  BMI 21.48 kg/m2  SpO2 98%      Review of Systems  Constitutional: Positive for unexpected weight change.  Respiratory: Positive for cough and wheezing.   Gastrointestinal: Positive for nausea and constipation.  Genitourinary: Positive for dysuria and decreased urine volume.  Musculoskeletal: Positive for neck pain.  Neurological: Positive for dizziness, tremors, weakness and numbness.       Tingling, spasms  Psychiatric/Behavioral: Positive for suicidal ideas, confusion and dysphoric mood. The patient is nervous/anxious.   All other systems reviewed and are negative.       Objective:   Physical Exam   General: Alert and oriented x 3, No apparent distress. Appears to be in good physical condition HEENT: Head is normocephalic, atraumatic, PERRLA, EOMI, sclera anicteric, oral mucosa pink and moist, dentition intact, ext ear canals clear,  Neck: Supple without JVD or lymphadenopathy Heart: Reg rate and rhythm. No murmurs rubs or gallops Chest: CTA bilaterally without wheezes, rales, or rhonchi; no distress Abdomen: Soft, non-tender, non-distended, bowel sounds positive. Extremities: No clubbing, cyanosis, or edema. Pulses are 2+ Skin: Clean and intact without signs of breakdown. Area of shoulder surgery noted.  Neuro: Pt is cognitively appropriate with normal insight, memory, and awareness. Cranial nerves 2-12 are intact. Sensory exam is normal. Reflexes are 2+ in all 4's. Fine motor coordination is intact. No tremors. Motor function is grossly 5/5.  Musculoskeletal: Left shoulder substantially limited with Abduction 40deg, flexion 45deg, ext 15 deg. She had pain along labrum,subacromial area and surrounding areas. No swelling or skin discoloration.  Area is NOT alodynic. She has spasm along the left trap and SCM. Shoulder girdle positioning and head posture is fair. Flexion of the neck caused discomrot on the left as did lateral bending to either side and rotation. Spurling's test negative. Left leg non-tender with palpation. She did have hip and thigh pain with resisted abduction and cross leg maneuver. SLR was equivocal. She had excellent pelvic positioning. Lumbar ROM was normal.   Psych: Pt's affect is appropriate. Pt is cooperative        Assessment & Plan:  1. Post-traumatic arthritis left shoulder s/p 2 shoulder surgeries. Now with adhesive capsulitis left shoulder.  2. Cervicalgia, spondylosis 3. Right ITB syndrome 4. Depression/insomnia  Plan: 1. Cervical xrays to rule out obvious disk disease or arthropathy 2. Robaxin for muscle spasms 3. Trial of PT if xrays are clear 4. Naproxen 500mg  bid 5. ITB stretching and exercises were provided 6. Discussed extension of the left elbow to avoid persistent paresthesias in ulnar hand. 7. Encouraged general exercise and leisure activities as tolerated. This will help with her mood if nothing else. 8. I will see her back in about a month. No narcotics were written for today per policy. The patient will need to have a consistent UDS before we begin prescription of these. She will need to receive another rx from her PCP in the meantime. 45 minutes of face to face patient care time were spent during this visit. All questions were encouraged and answered.

## 2013-09-20 NOTE — Patient Instructions (Signed)
Iliotibial Band Syndrome  with Rehab  The iliotibial (IT) band is a tendon that connects the hip muscles to the shinbone (tibia) and to one of the bones of the pelvis (ileum). The IT band passes by the knee and is often irritated by the outer portion of the knee (lateral femoral condyle). A fluid filled sac (bursa) exists between the tendon and the bone, to cushion and reduce friction. Overuse of the tendon may cause excessive friction, which results in IT band syndrome. This condition involves inflammation of the bursa (bursitis) and/or inflammation of the IT band (tendinitis).  SYMPTOMS   · Pain, tenderness, swelling, warmth, or redness over the IT band, at the outer knee (above the joint).  · Pain that travels up or down the thigh or leg.  · Initially, pain at the beginning of an exercise, that decreases once warmed up. Eventually, pain throughout the activity, getting worse as the activity continues. May cause the athlete to stop in the middle of training or competing.  · Pain that gets worse when running down hills or stairs, on banked tracks, or next to the curb on the street.  · Pain that increases when the foot of the affected leg hits the ground.  · Possibly, a crackling sound (crepitation) when the tendon or bursa is moved or touched.  CAUSES   IT band syndrome is caused by irritation of the IT band and the underlying bursa. This eventually results in inflammation and pain. IT band syndrome is an overuse injury.   RISK INCREASES WITH:  · Sports with repetitive knee-bending activities (distance running, cycling).  · Incorrect training techniques, including sudden changes in the intensity, frequency, or duration of training.  · Not enough rest between workouts.  · Poor strength and flexibility, especially a tight IT band.  · Failure to warm up properly before activity.  · Bow legs.  · Arthritis of the knee.  PREVENTION   · Warm up and stretch properly before activity.  · Allow for adequate recovery between  workouts.  · Maintain physical fitness:  · Strength, flexibility, and endurance.  · Cardiovascular fitness.  · Learn and use proper training technique, including reducing running mileage, shortening stride, and avoiding running on hills and banked surfaces.  · Wear arch supports (orthotics), if you have flat feet.  PROGNOSIS   If treated properly, IT band syndrome usually goes away within 6 weeks of treatment.  RELATED COMPLICATIONS   · Longer healing time, if not properly treated, or if not given enough time to heal.  · Recurring inflammation of the tendon and bursa, that may result in a chronic condition.  · Recurring symptoms, if activity is resumed too soon, with overuse, with a direct blow, or with poor training technique.  · Inability to complete training or competition.  TREATMENT   Treatment first involves the use of ice and medicine, to reduce pain and inflammation. The use of strengthening and stretching exercises may help reduce pain with activity. These exercises may be performed at home or with a therapist. For individuals with flat feet, an arch support (orthotic) may be helpful. Some individuals find that wearing a knee sleeve or compression bandage around the knee during workouts provides some relief. Certain training techniques, such as adjusting stride length, avoiding running on hills or stairs, changing the direction you run on a circular or banked track, or changing the side of the road you run on, if you run next to the curb, may help decrease symptoms   of IT band syndrome. Cyclists may need to change the seat height or foot position on their bicycles. An injection of cortisone into the bursa may be recommended. Surgery to remove the inflamed bursa and/or part of the IT band is only considered after at least 6 months of non-surgical treatment.   MEDICATION   · If pain medicine is needed, nonsteroidal anti-inflammatory medicines (aspirin and ibuprofen), or other minor pain relievers  (acetaminophen), are often advised.  · Do not take pain medicine for 7 days before surgery.  · Prescription pain relievers may be given, if your caregiver thinks they are needed. Use only as directed and only as much as you need.  · Corticosteroid injections may be given by your caregiver. These injections should be reserved for the most serious cases, because they may only be given a certain number of times.  HEAT AND COLD  · Cold treatment (icing) should be applied for 10 to 15 minutes every 2 to 3 hours for inflammation and pain, and immediately after activity that aggravates your symptoms. Use ice packs or an ice massage.  · Heat treatment may be used before performing stretching and strengthening activities prescribed by your caregiver, physical therapist, or athletic trainer. Use a heat pack or a warm water soak.  SEEK MEDICAL CARE IF:   · Symptoms get worse or do not improve in 2 to 4 weeks, despite treatment.  · New, unexplained symptoms develop. (Drugs used in treatment may produce side effects.)  EXERCISES   RANGE OF MOTION (ROM) AND STRETCHING EXERCISES - Iliotibial Band Syndrome  These exercises may help you when beginning to rehabilitate your injury. Your symptoms may go away with or without further involvement from your physician, physical therapist or athletic trainer. While completing these exercises, remember:   · Restoring tissue flexibility helps normal motion to return to the joints. This allows healthier, less painful movement and activity.  · An effective stretch should be held for at least 30 seconds.  · A stretch should never be painful. You should only feel a gentle lengthening or release in the stretched tissue.  STRETCH - Quadriceps, Prone   · Lie on your stomach on a firm surface, such as a bed or padded floor.  · Bend your right / left knee and grasp your ankle. If you are unable to reach your ankle or pant leg, use a belt around your foot to lengthen your reach.  · Gently pull your heel  toward your buttocks. Your knee should not slide out to the side. You should feel a stretch in the front of your thigh and knee.  · Hold this position for __________ seconds.  Repeat __________ times. Complete this stretch __________ times per day.   STRETCH  Iliotibial Band  · On the floor or bed, lie on your side, so your right / left leg is on top. Bend your knee and grab your ankle.  · Slowly bring your knee back so that your thigh is in line with your trunk. Keep your heel at your buttocks and gently arch your back, so your head, shoulders and hips line up.  · Slowly lower your leg so that your knee approaches the floor or bed, until you feel a gentle stretch on the outside of your right / left thigh. If you do not feel a stretch and your knee will not fall farther, place the heel of your opposite foot on top of your knee, and pull your thigh down farther.  ·   Hold this stretch for __________ seconds.  Repeat __________ times. Complete this stretch __________ times per day.  STRENGTHENING EXERCISES - Iliotibial Band Syndrome  Improving the flexibility of the IT band will best relieve your discomfort due to IT band syndrome. Strengthening exercises, however, can help improve both muscle endurance and joint mechanics, reducing the factors that can contribute to this condition. Your physician, physical therapist or athletic trainer may provide you with exercises that train specific muscle groups that are especially weak. The following exercises target muscles that are often weak in people who have IT band syndrome.  STRENGTH - Hip Abductors, Straight Leg Raises   Be aware of your form throughout the entire exercise, so that you exercise the correct muscles. Poor form means that you are not strengthening the correct muscles.  · Lie on your side, so that your head, shoulders, knee and hip line up. You may bend your lower knee to help maintain your balance. Your right / left leg should be on top.  · Roll your hips  slightly forward, so that your hips are stacked directly over each other and your right / left knee is facing forward.  · Lift your top leg up 4-6 inches, leading with your heel. Be sure that your foot does not drift forward and that your knee does not roll toward the ceiling.  · Hold this position for __________ seconds. You should feel the muscles in your outer hip lifting (you may not notice this until your leg begins to tire).  · Slowly lower your leg to the starting position. Allow the muscles to fully relax before beginning the next repetition.  Repeat __________ times. Complete this exercise __________ times per day.   STRENGTH - Quad/VMO, Isometric  · Sit in a chair with your right / left knee slightly bent. With your fingertips, feel the VMO muscle (just above the inside of your knee). The VMO is important in controlling the position of your kneecap.  · Keeping your fingertips on this muscle. Without actually moving your leg, attempt to drive your knee down, as if straightening your leg. You should feel your VMO tense. If you have a difficult time, you may wish to try the same exercise on your healthy knee first.  · Tense this muscle as hard as you can, without increasing any knee pain.  · Hold for __________ seconds. Relax the muscles slowly and completely between each repetition.  Repeat __________ times. Complete this exercise __________ times per day.   Document Released: 08/29/2005 Document Revised: 11/21/2011 Document Reviewed: 12/11/2008  [employer]® Patient Information ©2014 [employer].

## 2013-09-20 NOTE — Progress Notes (Signed)
Patient has established care with Dr. Naaman Plummer for pain management, but narcotics not written during that visit. I will prescribe pain medication for this patient today that covers 30 day supply.

## 2013-09-20 NOTE — Telephone Encounter (Signed)
Therapy orders placed.  Patient would like to go to [facility].

## 2013-09-20 NOTE — Telephone Encounter (Signed)
Her cervical films show some mild degenerative disease from C4-7. I would like her to start physical therapy. She was going to check if the location close to her house was a [facility] facility. If so, we can rx therapy there

## 2013-09-20 NOTE — Telephone Encounter (Signed)
Left message on vm, two rx, (oxycodone and xanax), are ready.

## 2013-09-24 ENCOUNTER — Other Ambulatory Visit: Payer: Self-pay

## 2013-09-24 DIAGNOSIS — G4700 Insomnia, unspecified: Secondary | ICD-10-CM

## 2013-09-24 MED ORDER — ESZOPICLONE 3 MG PO TABS: ORAL_TABLET | ORAL | Status: DC

## 2013-09-24 NOTE — Telephone Encounter (Signed)
Please phone in

## 2013-09-24 NOTE — Telephone Encounter (Signed)
Last seen 08/19/13  Cassandra [name]  If approved route to nurse to phone into [facility]

## 2013-09-24 NOTE — Progress Notes (Signed)
Aware. 

## 2013-09-27 ENCOUNTER — Other Ambulatory Visit: Payer: Self-pay | Admitting: *Deleted

## 2013-09-27 MED ORDER — ESCITALOPRAM OXALATE 10 MG PO TABS: ORAL_TABLET | ORAL | Status: DC

## 2013-10-01 NOTE — Telephone Encounter (Signed)
Called in.

## 2013-10-02 ENCOUNTER — Telehealth: Payer: Self-pay | Admitting: Physical Medicine & Rehabilitation

## 2013-10-02 NOTE — Telephone Encounter (Signed)
No xanax on UDS.  Why not?

## 2013-10-03 NOTE — Telephone Encounter (Signed)
Patient does not take xanax when she drives.  She says she had taken xanax the night before.

## 2013-10-21 ENCOUNTER — Telehealth: Payer: Self-pay | Admitting: General Practice

## 2013-10-21 ENCOUNTER — Telehealth: Payer: Self-pay

## 2013-10-21 ENCOUNTER — Other Ambulatory Visit: Payer: Self-pay | Admitting: General Practice

## 2013-10-21 DIAGNOSIS — M25512 Pain in left shoulder: Secondary | ICD-10-CM

## 2013-10-21 DIAGNOSIS — G894 Chronic pain syndrome: Secondary | ICD-10-CM

## 2013-10-21 DIAGNOSIS — F411 Generalized anxiety disorder: Secondary | ICD-10-CM

## 2013-10-21 MED ORDER — ALPRAZOLAM 0.5 MG PO TABS: ORAL_TABLET | ORAL | Status: DC

## 2013-10-21 MED ORDER — OXYCODONE-ACETAMINOPHEN 5-325 MG PO TABS: 1.0000 | ORAL_TABLET | Freq: Two times a day (BID) | ORAL | Status: DC | PRN

## 2013-10-21 NOTE — Telephone Encounter (Signed)
Ms Cassandra [name] called to clarify if she should continue to prescribe for patient.  Patient is due for a refill but her appointment is not until she will be out of her medication.  [place] will give patient enough medicaiton to get her through until her next appointment and hope that we take over prescribing.

## 2013-10-22 ENCOUNTER — Other Ambulatory Visit: Payer: Self-pay

## 2013-10-22 DIAGNOSIS — G894 Chronic pain syndrome: Secondary | ICD-10-CM

## 2013-10-22 NOTE — Telephone Encounter (Signed)
Last seen 08/19/13  Mae If approved route to nurse to call into [facility]

## 2013-10-23 ENCOUNTER — Encounter: Payer: Self-pay | Admitting: Physical Medicine & Rehabilitation

## 2013-10-23 ENCOUNTER — Encounter: Payer: Medicare Other | Attending: Physical Medicine & Rehabilitation | Admitting: Physical Medicine & Rehabilitation

## 2013-10-23 DIAGNOSIS — G894 Chronic pain syndrome: Secondary | ICD-10-CM | POA: Insufficient documentation

## 2013-10-23 DIAGNOSIS — Z79899 Other long term (current) drug therapy: Secondary | ICD-10-CM | POA: Insufficient documentation

## 2013-10-23 DIAGNOSIS — Z5181 Encounter for therapeutic drug level monitoring: Secondary | ICD-10-CM | POA: Insufficient documentation

## 2013-10-23 DIAGNOSIS — IMO0001 Reserved for inherently not codable concepts without codable children: Secondary | ICD-10-CM | POA: Insufficient documentation

## 2013-10-23 DIAGNOSIS — G8929 Other chronic pain: Secondary | ICD-10-CM | POA: Insufficient documentation

## 2013-10-23 DIAGNOSIS — M7500 Adhesive capsulitis of unspecified shoulder: Secondary | ICD-10-CM | POA: Insufficient documentation

## 2013-10-23 DIAGNOSIS — M25519 Pain in unspecified shoulder: Secondary | ICD-10-CM | POA: Insufficient documentation

## 2013-10-23 DIAGNOSIS — M2420 Disorder of ligament, unspecified site: Secondary | ICD-10-CM | POA: Insufficient documentation

## 2013-10-23 DIAGNOSIS — M629 Disorder of muscle, unspecified: Secondary | ICD-10-CM | POA: Insufficient documentation

## 2013-10-23 DIAGNOSIS — F411 Generalized anxiety disorder: Secondary | ICD-10-CM

## 2013-10-23 DIAGNOSIS — M19019 Primary osteoarthritis, unspecified shoulder: Secondary | ICD-10-CM | POA: Insufficient documentation

## 2013-10-23 DIAGNOSIS — M542 Cervicalgia: Secondary | ICD-10-CM | POA: Insufficient documentation

## 2013-10-23 MED ORDER — PREGABALIN 75 MG PO CAPS: 75.0000 mg | ORAL_CAPSULE | Freq: Two times a day (BID) | ORAL | Status: DC

## 2013-10-23 MED ORDER — OXYCODONE-ACETAMINOPHEN 5-325 MG PO TABS: 1.0000 | ORAL_TABLET | Freq: Two times a day (BID) | ORAL | Status: DC | PRN

## 2013-10-23 MED ORDER — ALPRAZOLAM 0.5 MG PO TABS
ORAL_TABLET | ORAL | Status: DC
Start: 2013-10-23 — End: 2013-11-19
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
## 2013-11-01 ENCOUNTER — Other Ambulatory Visit: Payer: Self-pay | Admitting: *Deleted

## 2013-11-02 MED ORDER — MECLIZINE HCL 25 MG PO TABS: ORAL_TABLET | ORAL | Status: DC

## 2013-11-05 ENCOUNTER — Other Ambulatory Visit: Payer: Self-pay | Admitting: *Deleted

## 2013-11-05 DIAGNOSIS — R42 Dizziness and giddiness: Secondary | ICD-10-CM

## 2013-11-05 MED ORDER — MECLIZINE HCL 25 MG PO TABS: ORAL_TABLET | ORAL | Status: DC

## 2013-11-05 NOTE — Telephone Encounter (Signed)
Patient last seen in office on 08-19-13. Please advise 

## 2013-11-18 ENCOUNTER — Other Ambulatory Visit: Payer: Self-pay | Admitting: *Deleted

## 2013-11-18 DIAGNOSIS — M542 Cervicalgia: Secondary | ICD-10-CM

## 2013-11-18 DIAGNOSIS — F411 Generalized anxiety disorder: Secondary | ICD-10-CM

## 2013-11-18 DIAGNOSIS — Z79899 Other long term (current) drug therapy: Secondary | ICD-10-CM

## 2013-11-18 DIAGNOSIS — M7502 Adhesive capsulitis of left shoulder: Secondary | ICD-10-CM

## 2013-11-18 DIAGNOSIS — M19012 Primary osteoarthritis, left shoulder: Secondary | ICD-10-CM

## 2013-11-18 DIAGNOSIS — G8929 Other chronic pain: Secondary | ICD-10-CM

## 2013-11-18 DIAGNOSIS — G894 Chronic pain syndrome: Secondary | ICD-10-CM

## 2013-11-18 DIAGNOSIS — M7501 Adhesive capsulitis of right shoulder: Secondary | ICD-10-CM

## 2013-11-18 DIAGNOSIS — M25512 Pain in left shoulder: Secondary | ICD-10-CM

## 2013-11-18 DIAGNOSIS — Z5181 Encounter for therapeutic drug level monitoring: Secondary | ICD-10-CM

## 2013-11-18 DIAGNOSIS — M7631 Iliotibial band syndrome, right leg: Secondary | ICD-10-CM

## 2013-11-18 DIAGNOSIS — M7918 Myalgia, other site: Secondary | ICD-10-CM

## 2013-11-18 MED ORDER — OXYCODONE-ACETAMINOPHEN 5-325 MG PO TABS: 1.0000 | ORAL_TABLET | Freq: Two times a day (BID) | ORAL | Status: DC | PRN

## 2013-11-18 NOTE — Telephone Encounter (Signed)
RX printed for MD to sign for RN visit 11/19/13 

## 2013-11-19 ENCOUNTER — Encounter: Payer: Self-pay | Admitting: *Deleted

## 2013-11-19 ENCOUNTER — Encounter: Payer: Medicare Other | Attending: Physical Medicine & Rehabilitation | Admitting: *Deleted

## 2013-11-19 VITALS — BP 146/69 | HR 87 | Resp 14

## 2013-11-19 DIAGNOSIS — M2420 Disorder of ligament, unspecified site: Secondary | ICD-10-CM | POA: Insufficient documentation

## 2013-11-19 DIAGNOSIS — G894 Chronic pain syndrome: Secondary | ICD-10-CM | POA: Insufficient documentation

## 2013-11-19 DIAGNOSIS — M25519 Pain in unspecified shoulder: Secondary | ICD-10-CM | POA: Insufficient documentation

## 2013-11-19 DIAGNOSIS — M19012 Primary osteoarthritis, left shoulder: Secondary | ICD-10-CM

## 2013-11-19 DIAGNOSIS — Z79899 Other long term (current) drug therapy: Secondary | ICD-10-CM

## 2013-11-19 DIAGNOSIS — M7918 Myalgia, other site: Secondary | ICD-10-CM

## 2013-11-19 DIAGNOSIS — Z5181 Encounter for therapeutic drug level monitoring: Secondary | ICD-10-CM | POA: Insufficient documentation

## 2013-11-19 DIAGNOSIS — M19019 Primary osteoarthritis, unspecified shoulder: Secondary | ICD-10-CM | POA: Insufficient documentation

## 2013-11-19 DIAGNOSIS — IMO0001 Reserved for inherently not codable concepts without codable children: Secondary | ICD-10-CM | POA: Insufficient documentation

## 2013-11-19 DIAGNOSIS — M7501 Adhesive capsulitis of right shoulder: Secondary | ICD-10-CM

## 2013-11-19 DIAGNOSIS — M629 Disorder of muscle, unspecified: Secondary | ICD-10-CM | POA: Insufficient documentation

## 2013-11-19 DIAGNOSIS — M7502 Adhesive capsulitis of left shoulder: Secondary | ICD-10-CM

## 2013-11-19 DIAGNOSIS — G8929 Other chronic pain: Secondary | ICD-10-CM | POA: Insufficient documentation

## 2013-11-19 DIAGNOSIS — M542 Cervicalgia: Secondary | ICD-10-CM | POA: Insufficient documentation

## 2013-11-19 DIAGNOSIS — M7631 Iliotibial band syndrome, right leg: Secondary | ICD-10-CM

## 2013-11-19 DIAGNOSIS — M25512 Pain in left shoulder: Secondary | ICD-10-CM

## 2013-11-19 DIAGNOSIS — M7500 Adhesive capsulitis of unspecified shoulder: Secondary | ICD-10-CM | POA: Insufficient documentation

## 2013-11-19 DIAGNOSIS — F411 Generalized anxiety disorder: Secondary | ICD-10-CM | POA: Insufficient documentation

## 2013-11-19 MED ORDER — ALPRAZOLAM 0.5 MG PO TABS: ORAL_TABLET | ORAL | Status: DC

## 2013-11-19 NOTE — Patient Instructions (Signed)
Follow up with Cassandra [name] on 12/18/13

## 2013-11-19 NOTE — Progress Notes (Signed)
Here for pill count and medication refills. Percocet 5/325 # 60 fill date 10/26/13 Today # 14  Alprazolam 0.5 mg # 30 Fill date 10/26/13    Today NV# 4    VSS   Mrs Briddell has not had any falls since last visit and is a moderate fall risk.  She has been educated and given a fall prevention.  She has been educated on the CSA and opioid informed consent and has signed each today.  She depressed about the way her pain id affecting her life.  She relates that she is basically a hermit and doesn't go out and do anything anymore or enjoy anything anymore because of the pain.  She said she thought pain management was about other alternatives other than just pain medication.  I explained that she is correct and there are other alternatives but unfortunately for some people the alternatives are cost prohibitive as well as not something they are willing to try. I asked if she had tried any PT and she has.  We discussed other activities such as swimming and bicycling but she is not able to do these.  She does have a hot tub and takes frequent hot baths when pain gets too bad.  I encouraged her to seek outside activities because if one sits at home all the time they tend to dwell on their pain which complicates matters even more. The Opioid informed consent discusses some of the alternatives.  Her pill counts were appropriate and I have given her refills on her percocet and called to the pharmacy her alprazolam. She will return in one month to the clinic for refill and to see Dr Naaman Plummer.

## 2013-11-27 ENCOUNTER — Other Ambulatory Visit: Payer: Self-pay | Admitting: *Deleted

## 2013-11-27 DIAGNOSIS — G4700 Insomnia, unspecified: Secondary | ICD-10-CM

## 2013-11-27 NOTE — Telephone Encounter (Signed)
Last ov 12/14. Last refill 10/01/13. If approved route to pool to call in to [facility].

## 2013-11-28 MED ORDER — ESZOPICLONE 3 MG PO TABS: ORAL_TABLET | ORAL | Status: DC

## 2013-12-03 ENCOUNTER — Encounter: Payer: Self-pay | Admitting: Gastroenterology

## 2013-12-05 ENCOUNTER — Other Ambulatory Visit: Payer: Self-pay | Admitting: Physical Medicine & Rehabilitation

## 2013-12-10 ENCOUNTER — Ambulatory Visit (INDEPENDENT_AMBULATORY_CARE_PROVIDER_SITE_OTHER): Payer: Medicare Other | Admitting: General Practice

## 2013-12-10 ENCOUNTER — Encounter: Payer: Self-pay | Admitting: General Practice

## 2013-12-10 VITALS — BP 140/86 | HR 91 | Temp 97.2°F | Ht 66.0 in | Wt 135.6 lb

## 2013-12-10 DIAGNOSIS — R5383 Other fatigue: Secondary | ICD-10-CM

## 2013-12-10 DIAGNOSIS — E785 Hyperlipidemia, unspecified: Secondary | ICD-10-CM

## 2013-12-10 DIAGNOSIS — R5381 Other malaise: Secondary | ICD-10-CM

## 2013-12-10 DIAGNOSIS — Z01419 Encounter for gynecological examination (general) (routine) without abnormal findings: Secondary | ICD-10-CM

## 2013-12-10 DIAGNOSIS — Z124 Encounter for screening for malignant neoplasm of cervix: Secondary | ICD-10-CM

## 2013-12-10 DIAGNOSIS — R531 Weakness: Secondary | ICD-10-CM

## 2013-12-10 DIAGNOSIS — Z0000 Encounter for general adult medical examination without abnormal findings: Secondary | ICD-10-CM

## 2013-12-10 LAB — POCT UA - MICROSCOPIC ONLY
Casts, Ur, LPF, POC: NEGATIVE
Crystals, Ur, HPF, POC: NEGATIVE
Mucus, UA: NEGATIVE
RBC, URINE, MICROSCOPIC: NEGATIVE
Yeast, UA: NEGATIVE

## 2013-12-10 LAB — POCT URINALYSIS DIPSTICK
Bilirubin, UA: NEGATIVE
Blood, UA: NEGATIVE
GLUCOSE UA: NEGATIVE
KETONES UA: NEGATIVE
Nitrite, UA: NEGATIVE
Protein, UA: NEGATIVE
SPEC GRAV UA: 1.01
Urobilinogen, UA: NEGATIVE
pH, UA: 6

## 2013-12-10 LAB — POCT CBC
GRANULOCYTE PERCENT: 73.3 % (ref 37–80)
HCT, POC: 45.6 % (ref 37.7–47.9)
Hemoglobin: 14.7 g/dL (ref 12.2–16.2)
Lymph, poc: 2.8 (ref 0.6–3.4)
MCH, POC: 30.1 pg (ref 27–31.2)
MCHC: 32.3 g/dL (ref 31.8–35.4)
MCV: 93.3 fL (ref 80–97)
MPV: 7.9 fL (ref 0–99.8)
POC GRANULOCYTE: 9.2 — AB (ref 2–6.9)
POC LYMPH PERCENT: 22.2 %L (ref 10–50)
Platelet Count, POC: 295 10*3/uL (ref 142–424)
RBC: 4.9 M/uL (ref 4.04–5.48)
RDW, POC: 14.7 %
WBC: 12.5 10*3/uL — AB (ref 4.6–10.2)

## 2013-12-10 NOTE — Patient Instructions (Signed)

## 2013-12-11 LAB — PAP IG W/ RFLX HPV ASCU: PAP SMEAR COMMENT: 0

## 2013-12-12 LAB — NMR, LIPOPROFILE
CHOLESTEROL: 157 mg/dL (ref <200)
HDL Cholesterol by NMR: 64 mg/dL (ref >=40)
HDL Particle Number: 32.1 umol/L (ref >=30.5)
LDL Particle Number: 643 nmol/L (ref <1000)
LDL Size: 21 nm (ref >20.5)
LDLC SERPL CALC-MCNC: 79 mg/dL (ref <100)
LP-IR SCORE: 34 (ref <=45)
Small LDL Particle Number: <90 nmol/L (ref <=527)
Triglycerides by NMR: 71 mg/dL (ref <150)

## 2013-12-12 LAB — CMP14+EGFR
ALBUMIN: 4.7 g/dL (ref 3.5–5.5)
ALK PHOS: 106 IU/L (ref 39–117)
ALT: 13 IU/L (ref 0–32)
AST: 22 IU/L (ref 0–40)
Albumin/Globulin Ratio: 2 (ref 1.1–2.5)
BUN / CREAT RATIO: 13 (ref 9–23)
BUN: 11 mg/dL (ref 6–24)
CHLORIDE: 104 mmol/L (ref 97–108)
CO2: 24 mmol/L (ref 18–29)
Calcium: 9.6 mg/dL (ref 8.7–10.2)
Creatinine, Ser: 0.85 mg/dL (ref 0.57–1.00)
GFR calc Af Amer: 87 mL/min/{1.73_m2} (ref >59)
GFR calc non Af Amer: 76 mL/min/{1.73_m2} (ref >59)
Globulin, Total: 2.3 g/dL (ref 1.5–4.5)
Glucose: 91 mg/dL (ref 65–99)
Potassium: 4.1 mmol/L (ref 3.5–5.2)
Sodium: 144 mmol/L (ref 134–144)
Total Bilirubin: 0.3 mg/dL (ref 0.0–1.2)
Total Protein: 7 g/dL (ref 6.0–8.5)

## 2013-12-12 LAB — THYROID PANEL WITH TSH
Free Thyroxine Index: 1.6 (ref 1.2–4.9)
T3 Uptake Ratio: 26 % (ref 24–39)
T4 TOTAL: 6.2 ug/dL (ref 4.5–12.0)
TSH: 0.808 u[IU]/mL (ref 0.450–4.500)

## 2013-12-12 LAB — VITAMIN B12: Vitamin B-12: 481 pg/mL (ref 211–946)

## 2013-12-12 LAB — VITAMIN D 25 HYDROXY (VIT D DEFICIENCY, FRACTURES): Vit D, 25-Hydroxy: 33 ng/mL (ref 30.0–100.0)
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
## 2013-12-18 ENCOUNTER — Encounter: Payer: Self-pay | Admitting: Physical Medicine & Rehabilitation

## 2013-12-18 ENCOUNTER — Encounter: Payer: Medicare Other | Attending: Physical Medicine & Rehabilitation | Admitting: Physical Medicine & Rehabilitation

## 2013-12-18 VITALS — BP 142/83 | HR 103 | Resp 14 | Ht 67.0 in | Wt 135.0 lb

## 2013-12-18 DIAGNOSIS — M629 Disorder of muscle, unspecified: Secondary | ICD-10-CM

## 2013-12-18 DIAGNOSIS — M7918 Myalgia, other site: Secondary | ICD-10-CM

## 2013-12-18 DIAGNOSIS — G894 Chronic pain syndrome: Secondary | ICD-10-CM

## 2013-12-18 DIAGNOSIS — Z5181 Encounter for therapeutic drug level monitoring: Secondary | ICD-10-CM

## 2013-12-18 DIAGNOSIS — M7500 Adhesive capsulitis of unspecified shoulder: Secondary | ICD-10-CM | POA: Insufficient documentation

## 2013-12-18 DIAGNOSIS — M542 Cervicalgia: Secondary | ICD-10-CM

## 2013-12-18 DIAGNOSIS — M47812 Spondylosis without myelopathy or radiculopathy, cervical region: Secondary | ICD-10-CM

## 2013-12-18 DIAGNOSIS — M2420 Disorder of ligament, unspecified site: Secondary | ICD-10-CM | POA: Insufficient documentation

## 2013-12-18 DIAGNOSIS — F411 Generalized anxiety disorder: Secondary | ICD-10-CM

## 2013-12-18 DIAGNOSIS — M7631 Iliotibial band syndrome, right leg: Secondary | ICD-10-CM

## 2013-12-18 DIAGNOSIS — G4700 Insomnia, unspecified: Secondary | ICD-10-CM

## 2013-12-18 DIAGNOSIS — M19019 Primary osteoarthritis, unspecified shoulder: Secondary | ICD-10-CM | POA: Insufficient documentation

## 2013-12-18 DIAGNOSIS — M25519 Pain in unspecified shoulder: Secondary | ICD-10-CM | POA: Insufficient documentation

## 2013-12-18 DIAGNOSIS — M25512 Pain in left shoulder: Secondary | ICD-10-CM

## 2013-12-18 DIAGNOSIS — M19012 Primary osteoarthritis, left shoulder: Secondary | ICD-10-CM

## 2013-12-18 DIAGNOSIS — M7502 Adhesive capsulitis of left shoulder: Secondary | ICD-10-CM

## 2013-12-18 DIAGNOSIS — G8929 Other chronic pain: Secondary | ICD-10-CM

## 2013-12-18 DIAGNOSIS — M7501 Adhesive capsulitis of right shoulder: Secondary | ICD-10-CM

## 2013-12-18 DIAGNOSIS — IMO0001 Reserved for inherently not codable concepts without codable children: Secondary | ICD-10-CM | POA: Insufficient documentation

## 2013-12-18 DIAGNOSIS — Z79899 Other long term (current) drug therapy: Secondary | ICD-10-CM

## 2013-12-18 MED ORDER — OXYCODONE-ACETAMINOPHEN 5-325 MG PO TABS: 1.0000 | ORAL_TABLET | Freq: Three times a day (TID) | ORAL | Status: DC | PRN

## 2013-12-18 MED ORDER — ESZOPICLONE 3 MG PO TABS: ORAL_TABLET | ORAL | Status: DC

## 2013-12-18 MED ORDER — ESCITALOPRAM OXALATE 10 MG PO TABS: ORAL_TABLET | ORAL | Status: DC

## 2013-12-18 MED ORDER — ALPRAZOLAM 0.5 MG PO TABS: ORAL_TABLET | ORAL | Status: DC

## 2013-12-18 NOTE — Patient Instructions (Signed)
DECREASE LYRICA TO ONCE DAILY AND IF NO PROBLEMS AFTER ONE WEEK THEN STOP.
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
## 2013-12-23 ENCOUNTER — Other Ambulatory Visit: Payer: Self-pay | Admitting: *Deleted

## 2013-12-23 DIAGNOSIS — R42 Dizziness and giddiness: Secondary | ICD-10-CM

## 2013-12-24 MED ORDER — MECLIZINE HCL 25 MG PO TABS
ORAL_TABLET | ORAL | Status: DC
Start: ? — End: 2014-01-17

## 2013-12-30 ENCOUNTER — Ambulatory Visit: Payer: Medicare Other | Attending: Physical Medicine & Rehabilitation | Admitting: Physical Therapy

## 2013-12-30 DIAGNOSIS — M25519 Pain in unspecified shoulder: Secondary | ICD-10-CM | POA: Insufficient documentation

## 2013-12-30 DIAGNOSIS — IMO0001 Reserved for inherently not codable concepts without codable children: Secondary | ICD-10-CM | POA: Diagnosis not present

## 2013-12-30 DIAGNOSIS — M542 Cervicalgia: Secondary | ICD-10-CM | POA: Insufficient documentation

## 2013-12-30 DIAGNOSIS — R5381 Other malaise: Secondary | ICD-10-CM | POA: Diagnosis not present

## 2013-12-30 DIAGNOSIS — M47812 Spondylosis without myelopathy or radiculopathy, cervical region: Secondary | ICD-10-CM | POA: Diagnosis not present

## 2014-01-01 ENCOUNTER — Ambulatory Visit: Payer: Medicare Other | Admitting: Physical Therapy

## 2014-01-01 DIAGNOSIS — IMO0001 Reserved for inherently not codable concepts without codable children: Secondary | ICD-10-CM | POA: Diagnosis not present

## 2014-01-07 ENCOUNTER — Ambulatory Visit: Payer: Medicare Other | Admitting: Physical Therapy

## 2014-01-07 DIAGNOSIS — IMO0001 Reserved for inherently not codable concepts without codable children: Secondary | ICD-10-CM | POA: Diagnosis not present

## 2014-01-09 ENCOUNTER — Ambulatory Visit: Payer: Medicare Other | Admitting: Physical Therapy

## 2014-01-09 DIAGNOSIS — IMO0001 Reserved for inherently not codable concepts without codable children: Secondary | ICD-10-CM | POA: Diagnosis not present

## 2014-01-10 ENCOUNTER — Telehealth: Payer: Self-pay

## 2014-01-10 NOTE — Telephone Encounter (Signed)
If cervical traction was helpful, i had mentioned to her that she could look into a home cervical traction collar

## 2014-01-10 NOTE — Telephone Encounter (Signed)
Patient states she can not afford to continue OP therapy, so she had to stop going. The co pay is $50 each time. She said that the therapist gave her some exercises to do at home. She is requesting any other suggestions that may help.

## 2014-01-13 ENCOUNTER — Other Ambulatory Visit: Payer: Self-pay | Admitting: Physical Medicine & Rehabilitation

## 2014-01-13 NOTE — Telephone Encounter (Signed)
Advised patient to look into a home cervical traction collar.  She says her son has one she can try.

## 2014-01-14 ENCOUNTER — Encounter: Payer: Medicare Other | Admitting: Physical Therapy

## 2014-01-15 ENCOUNTER — Encounter: Payer: Medicare Other | Attending: Physical Medicine & Rehabilitation | Admitting: Registered Nurse

## 2014-01-15 ENCOUNTER — Encounter: Payer: Self-pay | Admitting: Registered Nurse

## 2014-01-15 VITALS — BP 134/76 | HR 76 | Resp 14 | Ht 67.0 in | Wt 135.2 lb

## 2014-01-15 DIAGNOSIS — M25512 Pain in left shoulder: Secondary | ICD-10-CM

## 2014-01-15 DIAGNOSIS — Z79899 Other long term (current) drug therapy: Secondary | ICD-10-CM | POA: Insufficient documentation

## 2014-01-15 DIAGNOSIS — G8929 Other chronic pain: Secondary | ICD-10-CM

## 2014-01-15 DIAGNOSIS — G894 Chronic pain syndrome: Secondary | ICD-10-CM | POA: Insufficient documentation

## 2014-01-15 DIAGNOSIS — M2420 Disorder of ligament, unspecified site: Secondary | ICD-10-CM

## 2014-01-15 DIAGNOSIS — Z5181 Encounter for therapeutic drug level monitoring: Secondary | ICD-10-CM | POA: Insufficient documentation

## 2014-01-15 DIAGNOSIS — M47812 Spondylosis without myelopathy or radiculopathy, cervical region: Secondary | ICD-10-CM

## 2014-01-15 DIAGNOSIS — M19012 Primary osteoarthritis, left shoulder: Secondary | ICD-10-CM

## 2014-01-15 DIAGNOSIS — M542 Cervicalgia: Secondary | ICD-10-CM | POA: Insufficient documentation

## 2014-01-15 DIAGNOSIS — M7500 Adhesive capsulitis of unspecified shoulder: Secondary | ICD-10-CM

## 2014-01-15 DIAGNOSIS — M7502 Adhesive capsulitis of left shoulder: Secondary | ICD-10-CM

## 2014-01-15 DIAGNOSIS — IMO0001 Reserved for inherently not codable concepts without codable children: Secondary | ICD-10-CM | POA: Insufficient documentation

## 2014-01-15 DIAGNOSIS — F411 Generalized anxiety disorder: Secondary | ICD-10-CM

## 2014-01-15 DIAGNOSIS — M7918 Myalgia, other site: Secondary | ICD-10-CM

## 2014-01-15 DIAGNOSIS — M7501 Adhesive capsulitis of right shoulder: Secondary | ICD-10-CM

## 2014-01-15 DIAGNOSIS — M629 Disorder of muscle, unspecified: Secondary | ICD-10-CM | POA: Insufficient documentation

## 2014-01-15 DIAGNOSIS — M19019 Primary osteoarthritis, unspecified shoulder: Secondary | ICD-10-CM | POA: Insufficient documentation

## 2014-01-15 DIAGNOSIS — M7631 Iliotibial band syndrome, right leg: Secondary | ICD-10-CM

## 2014-01-15 DIAGNOSIS — M25519 Pain in unspecified shoulder: Secondary | ICD-10-CM | POA: Insufficient documentation

## 2014-01-15 MED ORDER — OXYCODONE-ACETAMINOPHEN 5-325 MG PO TABS: 1.0000 | ORAL_TABLET | Freq: Three times a day (TID) | ORAL | Status: DC | PRN

## 2014-01-15 MED ORDER — TRAZODONE 25 MG HALF TABLET: 25.0000 mg | ORAL_TABLET | Freq: Every day | ORAL | Status: DC

## 2014-01-15 NOTE — Progress Notes (Signed)
Subjective:    Patient ID: Cassandra [name], female    DOB: 06-01-1956, 58 y.o.   MRN: [id number]  HPI: [name] is a 58 year old female who returns for follow up for chronic pain and medication refill. She says her pain is located in her neck, left shoulder and right hip. Occassionally she has tingling in her right leg laterally. No pain in right leg at this time. She noticed an increase in insomnia due to increase intensity of pain. She was on Eszopiclone hasn't had it filled in three months. She is willing to try Trazodone. She mention she has suicidal thought, she denies a plan. She said what I really meant to say is : I don't want to harm myself, I just get depressed and feels worthless at times. She says due to the pain and medications she is not able to function like her friends and this can be bothersome. She also says with my religious background I won't harm myself or others. Emotional support offered.  Pain Inventory Average Pain 7 Pain Right Now 7 My pain is constant, sharp, burning, dull, tingling and aching  In the last 24 hours, has pain interfered with the following? General activity 6 Relation with others 10 Enjoyment of life 9 What TIME of day is your pain at its worst? morning and night Sleep (in general) Poor   Pain is worse with: bending, inactivity, standing and some activites Pain improves with: heat/ice, pacing activities, medication and TENS Relief from Meds: 5  Mobility walk without assistance ability to climb steps?  yes do you drive?  yes  Function not employed: date last employed 2007 disabled: date disabled 2007 I need assistance with the following:  dressing, household duties and shopping  Neuro/Psych bowel control problems weakness numbness tremor tingling spasms dizziness confusion depression anxiety suicidal thoughts  "just passing thoughts at times- never thought about actually acting on this--no plan" sometimes just a sense of  hopeless ness.  She has seen psych in past. No active thoughts  Prior Studies Any changes since last visit?  no  Physicians involved in your care Any changes since last visit?  no   Family History  Problem Relation Age of Onset  . Coronary artery disease Mother   . Diabetes Mother     borderline  . Coronary artery disease Father   . Prostate cancer Father   . Liver cancer Maternal Grandmother   . Pancreatic cancer Father   . Colon cancer      Both sides  . Liver disease Maternal Grandmother   . Kidney disease Mother     stage 4   History   Social History  . Marital Status: Married    Spouse Name: N/A    Number of Children: 1  . Years of Education: N/A   Occupational History  . Homemaker    Social History Main Topics  . Smoking status: Current Every Day Smoker -- 1.00 packs/day    Types: Cigarettes  . Smokeless tobacco: Never Used  . Alcohol Use: No  . Drug Use: No  . Sexual Activity: None   Other Topics Concern  . None   Social History Narrative  . None   Past Surgical History  Procedure Laterality Date  . Abdominal hysterectomy      age 88  . Bladder repair      sling  . Joint replacement      /knee  . Shoulder arthroscopy  multiple   Past Medical History  Diagnosis Date  . Nicotine addiction     2 packs per day   . Hyperlipidemia   . Chronic left shoulder pain     frozen   . Tubular adenoma of colon 01/31/2000    Dr. Richmond Campbell  . Maternal complication related to childbirth     Sepsis, staph  . Skin cancer     Rt. leg, squamous cell  . Anxiety   . Depression   . COPD (chronic obstructive pulmonary disease)     Dr. Redge Gainer   BP 134/76  Pulse 76  Resp 14  Ht 5\' 7"  (1.702 m)  Wt 135 lb 3.2 oz (61.326 kg)  BMI 21.17 kg/m2  SpO2 97%  Opioid Risk Score:   Fall Risk Score: Moderate Fall Risk (6-13 points) (educated and  fall prevention in the home handout given at previous visit )  Review of Systems  Constitutional:  Positive for appetite change.  Respiratory: Positive for cough, shortness of breath and wheezing.   Gastrointestinal: Positive for nausea, abdominal pain and constipation.  Genitourinary: Positive for difficulty urinating.  Musculoskeletal: Positive for back pain.  Psychiatric/Behavioral: Positive for dysphoric mood. The patient is nervous/anxious.        Has passing thoughts of hopelessness at times but no active thoughts of suicide.  All other systems reviewed and are negative.      Objective:   Physical Exam  Nursing note and vitals reviewed. Constitutional: She is oriented to person, place, and time. She appears well-developed and well-nourished.  HENT:  Head: Normocephalic and atraumatic.  Neck: Normal range of motion. Neck supple.  Cardiovascular: Normal rate, regular rhythm and normal heart sounds.   Pulmonary/Chest: Effort normal and breath sounds normal.  Musculoskeletal:  Normal Muscle Bulk: Muscle Testing Reveals: Upper Extremities: Muscle Strength Right 5/5. Left 4/5. Right Upper Extremity Full ROM Left Upper Extremity: Decreased ROM, Protective Position.Limited Range of  Motion:  Bilateral AC-Joint Tenderness Left Trapezius and Deltoid Muscle tenderness Right Gluteus maximus Tenderness Notes. Thoracic Paraspinal Tenderness T-5-T-7 Lumbar Paraspinal Tenderness  L-3-L-5 Arises from chair with ease Narrow Based gait Lower Extremities: Full ROM/ Muscle Strength 5/5.   Neurological: She is alert and oriented to person, place, and time.  Skin: Skin is warm.  Psychiatric: She has a normal mood and affect.  Admits to being Depressed          Assessment & Plan:  1. Post-traumatic arthritis left shoulder s/p 2 shoulder surgeries. Now with adhesive capsulitis left shoulder. : Refilled:oxyCODONE 5/325mg  one tablet every 8 hours as needed. #70 2. Cervicalgia, spondylosis: Continue Current Medication Regime.Lidoderm patches, Robaxin, Naprosyn, and Lyrica  3. Right  Iliotibial band syndrome: Continue exercise regime  4. Depression/insomnia/anxiety: Continue Lexapro and Xanax. Denies any suicidal plans.  20 minutes of face to face patient care time was spent during this isit. All questions were encouraged and answered.

## 2014-01-16 ENCOUNTER — Encounter: Payer: Medicare Other | Admitting: Physical Therapy

## 2014-01-17 ENCOUNTER — Other Ambulatory Visit: Payer: Self-pay

## 2014-01-17 DIAGNOSIS — R42 Dizziness and giddiness: Secondary | ICD-10-CM

## 2014-01-17 MED ORDER — MECLIZINE HCL 25 MG PO TABS: ORAL_TABLET | ORAL | Status: DC

## 2014-01-17 MED ORDER — OMEPRAZOLE 20 MG PO CPDR: 20.0000 mg | DELAYED_RELEASE_CAPSULE | Freq: Every day | ORAL | Status: DC

## 2014-01-17 NOTE — Telephone Encounter (Signed)
ast seen 12/10/13 Mae  This med not on [facility]

## 2014-02-14 ENCOUNTER — Encounter: Payer: Medicare Other | Attending: Physical Medicine & Rehabilitation | Admitting: Registered Nurse

## 2014-02-14 ENCOUNTER — Encounter: Payer: Self-pay | Admitting: Registered Nurse

## 2014-02-14 VITALS — BP 135/65 | HR 77 | Resp 14 | Ht 67.0 in | Wt 135.0 lb

## 2014-02-14 DIAGNOSIS — G8929 Other chronic pain: Secondary | ICD-10-CM

## 2014-02-14 DIAGNOSIS — Z79899 Other long term (current) drug therapy: Secondary | ICD-10-CM

## 2014-02-14 DIAGNOSIS — M7501 Adhesive capsulitis of right shoulder: Secondary | ICD-10-CM

## 2014-02-14 DIAGNOSIS — M47812 Spondylosis without myelopathy or radiculopathy, cervical region: Secondary | ICD-10-CM

## 2014-02-14 DIAGNOSIS — M7500 Adhesive capsulitis of unspecified shoulder: Secondary | ICD-10-CM

## 2014-02-14 DIAGNOSIS — M19019 Primary osteoarthritis, unspecified shoulder: Secondary | ICD-10-CM | POA: Insufficient documentation

## 2014-02-14 DIAGNOSIS — M542 Cervicalgia: Secondary | ICD-10-CM

## 2014-02-14 DIAGNOSIS — M2420 Disorder of ligament, unspecified site: Secondary | ICD-10-CM | POA: Insufficient documentation

## 2014-02-14 DIAGNOSIS — M7918 Myalgia, other site: Secondary | ICD-10-CM

## 2014-02-14 DIAGNOSIS — M25512 Pain in left shoulder: Secondary | ICD-10-CM

## 2014-02-14 DIAGNOSIS — M19012 Primary osteoarthritis, left shoulder: Secondary | ICD-10-CM

## 2014-02-14 DIAGNOSIS — M7502 Adhesive capsulitis of left shoulder: Secondary | ICD-10-CM

## 2014-02-14 DIAGNOSIS — IMO0001 Reserved for inherently not codable concepts without codable children: Secondary | ICD-10-CM

## 2014-02-14 DIAGNOSIS — M25519 Pain in unspecified shoulder: Secondary | ICD-10-CM | POA: Insufficient documentation

## 2014-02-14 DIAGNOSIS — Z5181 Encounter for therapeutic drug level monitoring: Secondary | ICD-10-CM

## 2014-02-14 DIAGNOSIS — F411 Generalized anxiety disorder: Secondary | ICD-10-CM

## 2014-02-14 DIAGNOSIS — G894 Chronic pain syndrome: Secondary | ICD-10-CM

## 2014-02-14 DIAGNOSIS — M629 Disorder of muscle, unspecified: Secondary | ICD-10-CM | POA: Insufficient documentation

## 2014-02-14 DIAGNOSIS — M7631 Iliotibial band syndrome, right leg: Secondary | ICD-10-CM

## 2014-02-14 MED ORDER — ZOLPIDEM TARTRATE 5 MG PO TABS: 5.0000 mg | ORAL_TABLET | Freq: Every evening | ORAL | Status: DC | PRN

## 2014-02-14 MED ORDER — OXYCODONE-ACETAMINOPHEN 5-325 MG PO TABS: 1.0000 | ORAL_TABLET | Freq: Three times a day (TID) | ORAL | Status: DC | PRN

## 2014-02-14 MED ORDER — PREGABALIN 75 MG PO CAPS: 75.0000 mg | ORAL_CAPSULE | Freq: Two times a day (BID) | ORAL | Status: DC

## 2014-02-14 NOTE — Progress Notes (Signed)
Subjective:    Patient ID: Cassandra [name], female    DOB: 19-Jun-1956, 58 y.o.   MRN: [id number]  HPI: [name] is a 58 year old female who returns for follow up for chronic pain and medication refill. She says her pain is located in her neck, left shoulder and right hip. She rates her pain 6. Her current exercise program is performing stretching exercises. She's living a sedentary lifestyle, she has been encouraged to increase her activity. She admits to feeling depressed she has no desire to harm herself or others. She refuses to see Dr. Valentina [name] at this time.She also complaining of insomnia she was started on trazodone she says she noticed the medication was making her angry. Medication was discarded. Spoke with Dr. Naaman [name] we will start a small dose of ambien.  Pain Inventory Average Pain 6 Pain Right Now 6 My pain is constant, sharp, dull, stabbing, tingling and aching  In the last 24 hours, has pain interfered with the following? General activity 7 Relation with others 9 Enjoyment of life 9 What TIME of day is your pain at its worst? morning and night Sleep (in general) Poor  Pain is worse with: bending, sitting, standing and some activites Pain improves with: rest, heat/ice, pacing activities and medication Relief from Meds: 6  Mobility walk without assistance ability to climb steps?  yes do you drive?  yes needs help with transfers  Function not employed: date last employed 2007 disabled: date disabled . I need assistance with the following:  dressing, household duties and shopping  Neuro/Psych bowel control problems weakness numbness tremor tingling spasms dizziness confusion depression anxiety loss of taste or smell  Prior Studies Any changes since last visit?  no  Physicians involved in your care Cassandra [name], Dr Cassandra [name],    Family History  Problem Relation Age of Onset  . Coronary artery disease Mother   . Diabetes Mother     borderline  .  Coronary artery disease Father   . Prostate cancer Father   . Liver cancer Maternal Grandmother   . Pancreatic cancer Father   . Colon cancer      Both sides  . Liver disease Maternal Grandmother   . Kidney disease Mother     stage 4   History   Social History  . Marital Status: Married    Spouse Name: N/A    Number of Children: 1  . Years of Education: N/A   Occupational History  . Homemaker    Social History Main Topics  . Smoking status: Current Every Day Smoker -- 1.00 packs/day    Types: Cigarettes  . Smokeless tobacco: Never Used  . Alcohol Use: No  . Drug Use: No  . Sexual Activity: None   Other Topics Concern  . None   Social History Narrative  . None   Past Surgical History  Procedure Laterality Date  . Abdominal hysterectomy      age 43  . Bladder repair      sling  . Joint replacement      /knee  . Shoulder arthroscopy      multiple   Past Medical History  Diagnosis Date  . Nicotine addiction     2 packs per day   . Hyperlipidemia   . Chronic left shoulder pain     frozen   . Tubular adenoma of colon 01/31/2000    Dr. Richmond [name]  . Maternal complication related to childbirth  Sepsis, staph  . Skin cancer     Rt. leg, squamous cell  . Anxiety   . Depression   . COPD (chronic obstructive pulmonary disease)     Dr. Redge [name]   BP 135/65  Pulse 77  Resp 14  Ht 5\' 7"  (1.702 m)  Wt 135 lb (61.236 kg)  BMI 21.14 kg/m2  SpO2 100%  Opioid Risk Score:   Fall Risk Score: Moderate Fall Risk (6-13 points) (patient educated handout declined)   Review of Systems  Constitutional: Positive for unexpected weight change.  Respiratory: Positive for cough.   Gastrointestinal: Positive for diarrhea and constipation.  Genitourinary: Positive for difficulty urinating.  Neurological: Positive for dizziness, tremors, weakness and numbness.       Spasm  Psychiatric/Behavioral: Positive for confusion and dysphoric mood. The patient is  nervous/anxious.   All other systems reviewed and are negative.      Objective:   Physical Exam  Nursing note and vitals reviewed. Constitutional: She is oriented to person, place, and time. She appears well-developed and well-nourished.  HENT:  Head: Normocephalic and atraumatic.  Neck: Normal range of motion. Neck supple.  Cardiovascular: Normal rate and regular rhythm.   Pulmonary/Chest: Effort normal and breath sounds normal.  Musculoskeletal:  Normal Muscle Bulk and Muscle testing Reveals: Upper Extremities: Right Arm Full ROM and Muscle Strength 5/5 Left arm decreased ROM 45 degrees, able to flex elbow decrease ROM with extension. Lower Extremities: Full ROM and Muscle Strength 5/5 Arises from chair with ease Narrow based Gait  Neurological: She is alert and oriented to person, place, and time.  Skin: Skin is warm and dry.  Psychiatric: She has a normal mood and affect.          Assessment & Plan:  1. Post-traumatic arthritis left shoulder s/p 2 shoulder surgeries. Now with adhesive capsulitis left shoulder. :  Refilled:oxyCODONE 5/325mg  one tablet every 8 hours as needed. #70  2. Cervicalgia, spondylosis: Continue Current Medication Regime.Lidoderm patches, Robaxin, Naprosyn, and Lyrica  3. Right Iliotibial band syndrome: Continue exercise regime  4. Depression/insomnia/anxiety: Continue Lexapro and Xanax. Denies any suicidal plans. RX: Ambien 5 mg HS #30.  20 minutes of face to face patient care time was spent during this isit. All questions were encouraged and answered.  Revision History

## 2014-02-24 ENCOUNTER — Other Ambulatory Visit: Payer: Self-pay | Admitting: Physical Medicine & Rehabilitation

## 2014-03-11 ENCOUNTER — Other Ambulatory Visit: Payer: Self-pay | Admitting: General Practice

## 2014-03-17 ENCOUNTER — Encounter: Payer: Self-pay | Admitting: Registered Nurse

## 2014-03-17 ENCOUNTER — Encounter: Payer: Medicare Other | Attending: Physical Medicine & Rehabilitation | Admitting: Registered Nurse

## 2014-03-17 VITALS — BP 136/77 | HR 95 | Resp 14 | Ht 67.0 in | Wt 132.0 lb

## 2014-03-17 DIAGNOSIS — Z5181 Encounter for therapeutic drug level monitoring: Secondary | ICD-10-CM | POA: Insufficient documentation

## 2014-03-17 DIAGNOSIS — F411 Generalized anxiety disorder: Secondary | ICD-10-CM | POA: Diagnosis present

## 2014-03-17 DIAGNOSIS — M7500 Adhesive capsulitis of unspecified shoulder: Secondary | ICD-10-CM | POA: Diagnosis present

## 2014-03-17 DIAGNOSIS — M25519 Pain in unspecified shoulder: Secondary | ICD-10-CM

## 2014-03-17 DIAGNOSIS — M19012 Primary osteoarthritis, left shoulder: Secondary | ICD-10-CM

## 2014-03-17 DIAGNOSIS — M542 Cervicalgia: Secondary | ICD-10-CM | POA: Insufficient documentation

## 2014-03-17 DIAGNOSIS — Z79899 Other long term (current) drug therapy: Secondary | ICD-10-CM

## 2014-03-17 DIAGNOSIS — M629 Disorder of muscle, unspecified: Secondary | ICD-10-CM | POA: Insufficient documentation

## 2014-03-17 DIAGNOSIS — G8929 Other chronic pain: Secondary | ICD-10-CM | POA: Insufficient documentation

## 2014-03-17 DIAGNOSIS — M7918 Myalgia, other site: Secondary | ICD-10-CM

## 2014-03-17 DIAGNOSIS — M19019 Primary osteoarthritis, unspecified shoulder: Secondary | ICD-10-CM

## 2014-03-17 DIAGNOSIS — M7631 Iliotibial band syndrome, right leg: Secondary | ICD-10-CM

## 2014-03-17 DIAGNOSIS — M7502 Adhesive capsulitis of left shoulder: Secondary | ICD-10-CM

## 2014-03-17 DIAGNOSIS — G894 Chronic pain syndrome: Secondary | ICD-10-CM | POA: Insufficient documentation

## 2014-03-17 DIAGNOSIS — IMO0001 Reserved for inherently not codable concepts without codable children: Secondary | ICD-10-CM | POA: Diagnosis present

## 2014-03-17 DIAGNOSIS — M2420 Disorder of ligament, unspecified site: Secondary | ICD-10-CM | POA: Insufficient documentation

## 2014-03-17 DIAGNOSIS — M7501 Adhesive capsulitis of right shoulder: Secondary | ICD-10-CM

## 2014-03-17 DIAGNOSIS — M25512 Pain in left shoulder: Secondary | ICD-10-CM

## 2014-03-17 DIAGNOSIS — M47812 Spondylosis without myelopathy or radiculopathy, cervical region: Secondary | ICD-10-CM

## 2014-03-17 MED ORDER — ZOLPIDEM TARTRATE 5 MG PO TABS: 5.0000 mg | ORAL_TABLET | Freq: Every evening | ORAL | Status: DC | PRN

## 2014-03-17 MED ORDER — OXYCODONE-ACETAMINOPHEN 5-325 MG PO TABS: 1.0000 | ORAL_TABLET | Freq: Three times a day (TID) | ORAL | Status: DC | PRN

## 2014-03-17 NOTE — Progress Notes (Signed)
Subjective:    Patient ID: Cassandra [name], female    DOB: [birth year], 58 y.o.   MRN: [id number]  HPI: [name] is a 58 year old female who returns for follow up for chronic pain and medication refill. She says her pain is located in her neck, left shoulder and lower back She rates her pain 7. Her current exercise program is performing stretching exercises and she has been walking. She says she has become more active since our last visit. Her mother has been in [facility] and has been visiting her and going to see her nephew who was in a car accident. Today, Cassandra [name] admits to feeling drowsy, she had her medications this morning. She usually has a driver. Today she drover herself and realizes this was not a good decision. Her family is at work at this time. She was instructed to lay down for a few hours, will revaluate after she has rested for a while. She was instructed to not to drive while taking her medications. She verbalizes understanding. Pain Inventory Average Pain 7 Pain Right Now 7 My pain is sharp, burning, stabbing, tingling and aching  In the last 24 hours, has pain interfered with the following? General activity 7 Relation with others 9 Enjoyment of life 8 What TIME of day is your pain at its worst? morning, night Sleep (in general) Poor  Pain is worse with: bending, sitting, standing and some activites Pain improves with: heat/ice, pacing activities and medication Relief from Meds: 5  Mobility walk without assistance ability to climb steps?  yes do you drive?  yes needs help with transfers transfers alone  Function disabled: date disabled 2007 I need assistance with the following:  dressing, meal prep, household duties and shopping  Neuro/Psych bowel control problems weakness numbness tremor tingling spasms dizziness confusion depression anxiety  Prior Studies Any changes since last visit?  no  Physicians involved in your care Any  changes since last visit?  no   Family History  Problem Relation Age of Onset  . Coronary artery disease Mother   . Diabetes Mother     borderline  . Coronary artery disease Father   . Prostate cancer Father   . Liver cancer Maternal Grandmother   . Pancreatic cancer Father   . Colon cancer      Both sides  . Liver disease Maternal Grandmother   . Kidney disease Mother     stage 4   History   Social History  . Marital Status: Married    Spouse Name: N/A    Number of Children: 1  . Years of Education: N/A   Occupational History  . Homemaker    Social History Main Topics  . Smoking status: Current Every Day Smoker -- 1.00 packs/day    Types: Cigarettes  . Smokeless tobacco: Never Used  . Alcohol Use: No  . Drug Use: No  . Sexual Activity: None   Other Topics Concern  . None   Social History Narrative  . None   Past Surgical History  Procedure Laterality Date  . Abdominal hysterectomy      age 32  . Bladder repair      sling  . Joint replacement      /knee  . Shoulder arthroscopy      multiple   Past Medical History  Diagnosis Date  . Nicotine addiction     2 packs per day   . Hyperlipidemia   .  Chronic left shoulder pain     frozen   . Tubular adenoma of colon 01/31/2000    Dr. Richmond Campbell  . Maternal complication related to childbirth     Sepsis, staph  . Skin cancer     Rt. leg, squamous cell  . Anxiety   . Depression   . COPD (chronic obstructive pulmonary disease)     Dr. Redge Gainer   BP 136/77  Pulse 95  Resp 14  Ht 5\' 7"  (1.702 m)  Wt 132 lb (59.875 kg)  BMI 20.67 kg/m2  SpO2 99%  Opioid Risk Score:   Fall Risk Score: Low Fall Risk (0-5 points) (pt educated on fall risk, brochure given to pt previously)    Review of Systems  Constitutional: Positive for diaphoresis, appetite change and unexpected weight change.  Respiratory: Positive for cough, shortness of breath and wheezing.   Gastrointestinal: Positive for nausea,  abdominal pain and constipation.  Genitourinary: Positive for decreased urine volume.       Bowel control problems  Musculoskeletal: Positive for back pain and neck pain.  Skin: Positive for rash.  Neurological: Positive for dizziness, tremors, weakness and numbness.       Tingling, spasms  Psychiatric/Behavioral: Positive for confusion. The patient is nervous/anxious.        Depression  All other systems reviewed and are negative.      Objective:   Physical Exam        Assessment & Plan:  1. Post-traumatic arthritis left shoulder s/p 2 shoulder surgeries. Now with adhesive capsulitis left shoulder. :  Refilled:oxyCODONE 5/325mg  one tablet every 8 hours as needed. #70  2. Cervicalgia, spondylosis: Continue Current Medication Regime.Lidoderm patches, Robaxin, Naprosyn, and Lyrica  3. Right Iliotibial band syndrome: Continue exercise regime  4. Depression/insomnia/anxiety: Continue Lexapro and Xanax. Continue  Ambien 5 mg HS #30.   20 minutes of face to face patient care time was spent during this isit. All questions were encouraged and answered.

## 2014-04-15 ENCOUNTER — Encounter: Payer: Self-pay | Admitting: Registered Nurse

## 2014-04-15 ENCOUNTER — Encounter: Payer: Medicare Other | Attending: Physical Medicine & Rehabilitation | Admitting: Registered Nurse

## 2014-04-15 VITALS — BP 129/51 | HR 79 | Resp 14 | Wt 140.6 lb

## 2014-04-15 DIAGNOSIS — G8929 Other chronic pain: Secondary | ICD-10-CM

## 2014-04-15 DIAGNOSIS — F411 Generalized anxiety disorder: Secondary | ICD-10-CM | POA: Diagnosis present

## 2014-04-15 DIAGNOSIS — Z79899 Other long term (current) drug therapy: Secondary | ICD-10-CM | POA: Insufficient documentation

## 2014-04-15 DIAGNOSIS — G894 Chronic pain syndrome: Secondary | ICD-10-CM | POA: Diagnosis present

## 2014-04-15 DIAGNOSIS — M25519 Pain in unspecified shoulder: Secondary | ICD-10-CM | POA: Insufficient documentation

## 2014-04-15 DIAGNOSIS — M19012 Primary osteoarthritis, left shoulder: Secondary | ICD-10-CM

## 2014-04-15 DIAGNOSIS — M2420 Disorder of ligament, unspecified site: Secondary | ICD-10-CM | POA: Insufficient documentation

## 2014-04-15 DIAGNOSIS — M7502 Adhesive capsulitis of left shoulder: Secondary | ICD-10-CM

## 2014-04-15 DIAGNOSIS — IMO0001 Reserved for inherently not codable concepts without codable children: Secondary | ICD-10-CM | POA: Diagnosis present

## 2014-04-15 DIAGNOSIS — Z5181 Encounter for therapeutic drug level monitoring: Secondary | ICD-10-CM | POA: Diagnosis present

## 2014-04-15 DIAGNOSIS — M7500 Adhesive capsulitis of unspecified shoulder: Secondary | ICD-10-CM | POA: Insufficient documentation

## 2014-04-15 DIAGNOSIS — M629 Disorder of muscle, unspecified: Secondary | ICD-10-CM

## 2014-04-15 DIAGNOSIS — M542 Cervicalgia: Secondary | ICD-10-CM

## 2014-04-15 DIAGNOSIS — M7631 Iliotibial band syndrome, right leg: Secondary | ICD-10-CM

## 2014-04-15 DIAGNOSIS — M7501 Adhesive capsulitis of right shoulder: Secondary | ICD-10-CM

## 2014-04-15 DIAGNOSIS — M47812 Spondylosis without myelopathy or radiculopathy, cervical region: Secondary | ICD-10-CM

## 2014-04-15 DIAGNOSIS — M19019 Primary osteoarthritis, unspecified shoulder: Secondary | ICD-10-CM

## 2014-04-15 DIAGNOSIS — M7918 Myalgia, other site: Secondary | ICD-10-CM

## 2014-04-15 DIAGNOSIS — M25512 Pain in left shoulder: Secondary | ICD-10-CM

## 2014-04-15 MED ORDER — ZOLPIDEM TARTRATE 5 MG PO TABS: 5.0000 mg | ORAL_TABLET | Freq: Every evening | ORAL | Status: DC | PRN

## 2014-04-15 MED ORDER — OXYCODONE-ACETAMINOPHEN 5-325 MG PO TABS: 1.0000 | ORAL_TABLET | Freq: Three times a day (TID) | ORAL | Status: DC | PRN

## 2014-04-15 MED ORDER — ALPRAZOLAM 0.5 MG PO TABS: ORAL_TABLET | ORAL | Status: DC

## 2014-04-15 NOTE — Progress Notes (Signed)
Subjective:    Patient ID: Cassandra [name], female    DOB: 16-Jul-1956, 58 y.o.   MRN: [id number]  HPI: [name] is a 58 year old female who returns for follow up for chronic pain and medication refill. She says her pain is located in her neck, left shoulder, and left arm, left shoulder blade. She rates her pain 7. Her current exercise program is performing stretching exercises and walking.   Pain Inventory Average Pain 7 Pain Right Now 7 My pain is burning, dull, stabbing, tingling and aching  In the last 24 hours, has pain interfered with the following? General activity 6 Relation with others 9 Enjoyment of life 9 What TIME of day is your pain at its worst? morning and night Sleep (in general) Poor  Pain is worse with: bending, sitting, inactivity, standing and some activites Pain improves with: heat/ice, pacing activities and medication Relief from Meds: 5  Mobility walk without assistance ability to climb steps?  yes do you drive?  yes  Function disabled: date disabled 2007 I need assistance with the following:  household duties and shopping  Neuro/Psych bowel control problems weakness numbness tremor tingling spasms dizziness confusion depression anxiety  Prior Studies Any changes since last visit?  no  Physicians involved in your care Any changes since last visit?  no   Family History  Problem Relation Age of Onset  . Coronary artery disease Mother   . Diabetes Mother     borderline  . Coronary artery disease Father   . Prostate cancer Father   . Liver cancer Maternal Grandmother   . Pancreatic cancer Father   . Colon cancer      Both sides  . Liver disease Maternal Grandmother   . Kidney disease Mother     stage 4   History   Social History  . Marital Status: Married    Spouse Name: N/A    Number of Children: 1  . Years of Education: N/A   Occupational History  . Homemaker    Social History Main Topics  . Smoking status:  Current Every Day Smoker -- 1.00 packs/day    Types: Cigarettes  . Smokeless tobacco: Never Used  . Alcohol Use: No  . Drug Use: No  . Sexual Activity: None   Other Topics Concern  . None   Social History Narrative  . None   Past Surgical History  Procedure Laterality Date  . Abdominal hysterectomy      age 63  . Bladder repair      sling  . Joint replacement      /knee  . Shoulder arthroscopy      multiple   Past Medical History  Diagnosis Date  . Nicotine addiction     2 packs per day   . Hyperlipidemia   . Chronic left shoulder pain     frozen   . Tubular adenoma of colon 01/31/2000    Dr. Richmond Campbell  . Maternal complication related to childbirth     Sepsis, staph  . Skin cancer     Rt. leg, squamous cell  . Anxiety   . Depression   . COPD (chronic obstructive pulmonary disease)     Dr. Redge Gainer   BP 129/51  Pulse 79  Resp 14  Wt 140 lb 9.6 oz (63.776 kg)  SpO2 98%  Opioid Risk Score:   Fall Risk Score: Low Fall Risk (0-5 points) (previously educated and given handout) Review of  Systems  Constitutional: Positive for diaphoresis, appetite change and unexpected weight change.  Respiratory: Positive for cough, shortness of breath and wheezing.        Emphysema  Gastrointestinal: Positive for nausea, abdominal pain and constipation.       Bowel Control Problems  Genitourinary: Positive for dysuria and difficulty urinating.  Musculoskeletal:       Spasms  Neurological: Positive for dizziness, tremors, weakness and numbness.       Tingling  Psychiatric/Behavioral: Positive for confusion and dysphoric mood. The patient is nervous/anxious.   All other systems reviewed and are negative.      Objective:   Physical Exam  Nursing note and vitals reviewed. Constitutional: She is oriented to person, place, and time. She appears well-developed and well-nourished.  HENT:  Head: Normocephalic and atraumatic.  Neck: Normal range of motion. Neck supple.    Cervical Paraspinal Tenderness: C-3- C-5  Cardiovascular: Normal rate, regular rhythm and normal heart sounds.   Pulmonary/Chest: Effort normal and breath sounds normal.  Musculoskeletal:  Normal Muscle Bulk and Muscle testing Reveals: Upper Extremities: Right Arm Full ROM and Muscle Strength 5/5 Left Arm with Decreased ROM 45 Degrees and Muscle Strength 4/5 Left AC Joint Tenderness Left Spine of Scapula Tenderness Lower Extremities: Full ROM and Muscle Strength 5/5 Arises from chair with ease Narrow based Gait    Neurological: She is alert and oriented to person, place, and time.  Skin: Skin is warm and dry.  Psychiatric: She has a normal mood and affect.          Assessment & Plan:  1. Post-traumatic arthritis left shoulder s/p 2 shoulder surgeries. Now with adhesive capsulitis left shoulder. :  Refilled:oxyCODONE 5/325mg  one tablet every 8 hours as needed. #70  2. Cervicalgia, spondylosis: Continue Current Medication Regime.Lidoderm patches, Robaxin, Naprosyn, and Lyrica  3. Right Iliotibial band syndrome: Continue exercise regime  4. Depression/insomnia/anxiety: Continue Lexapro and Xanax. Continue Ambien 5 mg HS #30.   20 minutes of face to face patient care time was spent during this isit. All questions were encouraged and answered.

## 2014-04-18 ENCOUNTER — Encounter: Payer: Self-pay | Admitting: Physician Assistant

## 2014-04-18 ENCOUNTER — Ambulatory Visit (INDEPENDENT_AMBULATORY_CARE_PROVIDER_SITE_OTHER): Payer: Medicare Other | Admitting: Physician Assistant

## 2014-04-18 VITALS — BP 120/74 | HR 79 | Temp 99.2°F | Ht 67.0 in | Wt 139.4 lb

## 2014-04-18 DIAGNOSIS — Z8619 Personal history of other infectious and parasitic diseases: Secondary | ICD-10-CM

## 2014-04-18 DIAGNOSIS — R109 Unspecified abdominal pain: Secondary | ICD-10-CM

## 2014-04-18 DIAGNOSIS — N3000 Acute cystitis without hematuria: Secondary | ICD-10-CM

## 2014-04-18 LAB — POCT URINALYSIS DIPSTICK
BILIRUBIN UA: NEGATIVE
Blood, UA: NEGATIVE
Glucose, UA: NEGATIVE
KETONES UA: NEGATIVE
NITRITE UA: POSITIVE
PH UA: 6.5
PROTEIN UA: NEGATIVE
Spec Grav, UA: 1.015
Urobilinogen, UA: NEGATIVE

## 2014-04-18 LAB — POCT UA - MICROSCOPIC ONLY
Casts, Ur, LPF, POC: NEGATIVE
Crystals, Ur, HPF, POC: NEGATIVE
Mucus, UA: NEGATIVE
RBC, URINE, MICROSCOPIC: NEGATIVE
Yeast, UA: NEGATIVE

## 2014-04-18 MED ORDER — NITROFURANTOIN MONOHYD MACRO 100 MG PO CAPS: 100.0000 mg | ORAL_CAPSULE | Freq: Two times a day (BID) | ORAL | Status: DC

## 2014-04-18 NOTE — Progress Notes (Signed)
   Subjective:    Patient ID: Cassandra [name], female    DOB: June 26, 1956, 58 y.o.   MRN: [id number]  HPI 58 y/o female with h/o h pylori infection presents with c/o epigastric abdominal pain, frequent constipation and bloating. Also has c/o dysuria.    Review of Systems  Constitutional: Positive for diaphoresis and fatigue. Negative for chills, activity change and appetite change.  HENT: Negative.   Eyes: Negative.   Respiratory: Negative.   Cardiovascular: Negative.   Gastrointestinal: Positive for abdominal pain, constipation and abdominal distention. Negative for vomiting, diarrhea and blood in stool.  Genitourinary: Positive for dysuria, urgency and frequency. Negative for vaginal bleeding and vaginal discharge.  Skin: Negative.   Hematological: Negative.   Psychiatric/Behavioral: Negative.        Objective:   Physical Exam  Nursing note and vitals reviewed. Constitutional: She is oriented to person, place, and time. She appears well-developed and well-nourished. No distress.  HENT:  Head: Normocephalic and atraumatic.  Right Ear: External ear normal.  Left Ear: External ear normal.  Mouth/Throat: Oropharynx is clear and moist. No oropharyngeal exudate.  Eyes: Conjunctivae are normal. Pupils are equal, round, and reactive to light.  Cardiovascular: Normal rate, regular rhythm and normal heart sounds.  Exam reveals no gallop and no friction rub.   No murmur heard. Pulmonary/Chest: Effort normal and breath sounds normal. No respiratory distress.  Abdominal: Soft. She exhibits distension. She exhibits no mass. There is tenderness (suprapubic). There is no rebound and no guarding.  Neurological: She is alert and oriented to person, place, and time.  Skin: She is not diaphoretic.  Psychiatric: She has a normal mood and affect. Her behavior is normal. Judgment and thought content normal.          Assessment & Plan:  1. Acute Cystitis: Rx Macrobid 100mg  BID x 7 days. RTC in 2  weeks for reassessment and to establish care with a [facility] provider for possible UTI prophylactic treatment on an ongoing basis since she has had to do this in the past and has h/o bladder surgery.   2. Abdominal pain with constipation: Test for H Pylori infection since patient has had this in the past. Test given for patient to RTC with stool specimen

## 2014-05-06 ENCOUNTER — Other Ambulatory Visit: Payer: Self-pay | Admitting: Physical Medicine & Rehabilitation

## 2014-05-08 ENCOUNTER — Encounter: Payer: Self-pay | Admitting: Family

## 2014-05-08 ENCOUNTER — Ambulatory Visit (INDEPENDENT_AMBULATORY_CARE_PROVIDER_SITE_OTHER): Payer: Medicare Other | Admitting: Family

## 2014-05-08 VITALS — BP 133/73 | HR 87 | Temp 98.1°F | Ht 67.0 in | Wt 139.4 lb

## 2014-05-08 DIAGNOSIS — Z09 Encounter for follow-up examination after completed treatment for conditions other than malignant neoplasm: Secondary | ICD-10-CM

## 2014-05-08 DIAGNOSIS — R109 Unspecified abdominal pain: Secondary | ICD-10-CM

## 2014-05-08 DIAGNOSIS — Z1211 Encounter for screening for malignant neoplasm of colon: Secondary | ICD-10-CM

## 2014-05-08 NOTE — Patient Instructions (Signed)

## 2014-05-08 NOTE — Progress Notes (Signed)
   Subjective:    Patient ID: Cassandra [name], female    DOB: 1955-11-25, 58 y.o.   MRN: [id number]  Abdominal Pain Pertinent negatives include no headaches.   Pt here for follow-up for abd pain, tarry stools, and UTI. Pt states she was seen in office and started on macrobid 100mg  Bid X 7days. Pt states she is much better. Pt states she does not currently have any abd pain at this time and states her stools are normal in color. Pt states she is slightly bloated, but that seem to be the "normal". Pt would also like referral for colonoscopy while she is here.    Review of Systems  Constitutional: Negative.   HENT: Negative.   Eyes: Negative.   Respiratory: Negative.  Negative for shortness of breath.   Cardiovascular: Negative.  Negative for palpitations.  Gastrointestinal: Positive for abdominal pain.  Endocrine: Negative.   Genitourinary: Negative.   Musculoskeletal: Negative.   Neurological: Negative.  Negative for headaches.  Hematological: Negative.   Psychiatric/Behavioral: Negative.   All other systems reviewed and are negative.      Objective:   Physical Exam  Vitals reviewed. Constitutional: She is oriented to person, place, and time. She appears well-developed and well-nourished. No distress.  Eyes: Pupils are equal, round, and reactive to light.  Neck: Normal range of motion. Neck supple. No thyromegaly present.  Cardiovascular: Normal rate, regular rhythm, normal heart sounds and intact distal pulses.   No murmur heard. Pulmonary/Chest: Effort normal and breath sounds normal. No respiratory distress. She has no wheezes.  Abdominal: Soft. Bowel sounds are normal. She exhibits no distension. There is no tenderness.  Musculoskeletal: Normal range of motion. She exhibits no edema and no tenderness.  Neurological: She is alert and oriented to person, place, and time. She has normal reflexes. No cranial nerve deficit.  Skin: Skin is warm and dry.  Psychiatric: She has a  normal mood and affect. Her behavior is normal. Judgment and thought content normal.     BP 133/73  Pulse 87  Temp(Src) 98.1 F (36.7 C) (Oral)  Ht 5\' 7"  (1.702 m)  Wt 139 lb 6.4 oz (63.231 kg)  BMI 21.83 kg/m2      Assessment & Plan:  1. Colon cancer screening - Ambulatory referral to Gastroenterology  2. Follow-up exam after treatment -Report any UTI s/s -Encourage healthy diet and exercise RTO prn  [name], FNP

## 2014-05-13 ENCOUNTER — Other Ambulatory Visit: Payer: Self-pay | Admitting: Physical Medicine & Rehabilitation

## 2014-05-14 ENCOUNTER — Ambulatory Visit: Payer: Medicare Other | Admitting: Physical Medicine & Rehabilitation

## 2014-05-14 ENCOUNTER — Other Ambulatory Visit: Payer: Self-pay | Admitting: Nurse Practitioner

## 2014-05-14 ENCOUNTER — Encounter: Payer: Self-pay | Admitting: Registered Nurse

## 2014-05-14 ENCOUNTER — Encounter: Payer: Medicare Other | Attending: Physical Medicine & Rehabilitation | Admitting: Registered Nurse

## 2014-05-14 VITALS — BP 127/55 | HR 83 | Resp 16 | Ht 66.0 in | Wt 138.0 lb

## 2014-05-14 DIAGNOSIS — M7502 Adhesive capsulitis of left shoulder: Secondary | ICD-10-CM

## 2014-05-14 DIAGNOSIS — G8929 Other chronic pain: Secondary | ICD-10-CM | POA: Diagnosis present

## 2014-05-14 DIAGNOSIS — M25512 Pain in left shoulder: Secondary | ICD-10-CM

## 2014-05-14 DIAGNOSIS — Z5181 Encounter for therapeutic drug level monitoring: Secondary | ICD-10-CM

## 2014-05-14 DIAGNOSIS — IMO0001 Reserved for inherently not codable concepts without codable children: Secondary | ICD-10-CM

## 2014-05-14 DIAGNOSIS — M7501 Adhesive capsulitis of right shoulder: Secondary | ICD-10-CM

## 2014-05-14 DIAGNOSIS — M7500 Adhesive capsulitis of unspecified shoulder: Secondary | ICD-10-CM | POA: Diagnosis present

## 2014-05-14 DIAGNOSIS — M629 Disorder of muscle, unspecified: Secondary | ICD-10-CM

## 2014-05-14 DIAGNOSIS — M19019 Primary osteoarthritis, unspecified shoulder: Secondary | ICD-10-CM

## 2014-05-14 DIAGNOSIS — M7631 Iliotibial band syndrome, right leg: Secondary | ICD-10-CM

## 2014-05-14 DIAGNOSIS — Z79899 Other long term (current) drug therapy: Secondary | ICD-10-CM | POA: Diagnosis present

## 2014-05-14 DIAGNOSIS — M25519 Pain in unspecified shoulder: Secondary | ICD-10-CM | POA: Diagnosis not present

## 2014-05-14 DIAGNOSIS — M542 Cervicalgia: Secondary | ICD-10-CM | POA: Diagnosis present

## 2014-05-14 DIAGNOSIS — F411 Generalized anxiety disorder: Secondary | ICD-10-CM | POA: Diagnosis present

## 2014-05-14 DIAGNOSIS — G894 Chronic pain syndrome: Secondary | ICD-10-CM

## 2014-05-14 DIAGNOSIS — M2420 Disorder of ligament, unspecified site: Secondary | ICD-10-CM

## 2014-05-14 DIAGNOSIS — M19012 Primary osteoarthritis, left shoulder: Secondary | ICD-10-CM

## 2014-05-14 DIAGNOSIS — M7918 Myalgia, other site: Secondary | ICD-10-CM

## 2014-05-14 MED ORDER — OXYCODONE-ACETAMINOPHEN 5-325 MG PO TABS: 1.0000 | ORAL_TABLET | Freq: Three times a day (TID) | ORAL | Status: DC | PRN

## 2014-05-14 MED ORDER — ZOLPIDEM TARTRATE 5 MG PO TABS: 5.0000 mg | ORAL_TABLET | Freq: Every evening | ORAL | Status: DC | PRN

## 2014-05-14 NOTE — Progress Notes (Signed)
Subjective:    Patient ID: Cassandra [name], female    DOB: 23-Apr-1956, 58 y.o.   MRN: [id number]  HPI: [name] is a 58 year old female who returns for follow up for chronic pain and medication refill. She says her pain is located in her neck, left shoulder, and left arm, left shoulder blade and right hand. She rates her pain 7. Her current exercise program is performing stretching exercises and walking.   Pain Inventory Average Pain 7 Pain Right Now 7 My pain is burning, dull, stabbing, tingling and aching  In the last 24 hours, has pain interfered with the following? General activity 6 Relation with others 9 Enjoyment of life 9 What TIME of day is your pain at its worst? morning, night Sleep (in general) Poor  Pain is worse with: bending, sitting, standing and some activites Pain improves with: heat/ice, pacing activities and medication Relief from Meds: 4  Mobility walk without assistance ability to climb steps?  yes do you drive?  yes needs help with transfers  Function not employed: date last employed 2007 disabled: date disabled 2005 I need assistance with the following:  dressing, meal prep, household duties and shopping  Neuro/Psych bladder control problems bowel control problems weakness numbness tremor tingling spasms dizziness confusion depression anxiety  Prior Studies Any changes since last visit?  no  Physicians involved in your care Any changes since last visit?  no   Family History  Problem Relation Age of Onset  . Coronary artery disease Mother   . Diabetes Mother     borderline  . Coronary artery disease Father   . Prostate cancer Father   . Liver cancer Maternal Grandmother   . Pancreatic cancer Father   . Colon cancer      Both sides  . Liver disease Maternal Grandmother   . Kidney disease Mother     stage 4   History   Social History  . Marital Status: Married    Spouse Name: N/A    Number of Children: 1  . Years  of Education: N/A   Occupational History  . Homemaker    Social History Main Topics  . Smoking status: Current Every Day Smoker -- 1.00 packs/day    Types: Cigarettes  . Smokeless tobacco: Never Used  . Alcohol Use: No  . Drug Use: No  . Sexual Activity: None   Other Topics Concern  . None   Social History Narrative  . None   Past Surgical History  Procedure Laterality Date  . Abdominal hysterectomy      age 10  . Bladder repair      sling  . Joint replacement      /knee  . Shoulder arthroscopy      multiple   Past Medical History  Diagnosis Date  . Nicotine addiction     2 packs per day   . Hyperlipidemia   . Chronic left shoulder pain     frozen   . Tubular adenoma of colon 01/31/2000    Dr. Richmond Campbell  . Maternal complication related to childbirth     Sepsis, staph  . Skin cancer     Rt. leg, squamous cell  . Anxiety   . Depression   . COPD (chronic obstructive pulmonary disease)     Dr. Redge Gainer   BP 127/55  Pulse 83  Resp 16  Ht 5\' 6"  (1.676 m)  Wt 138 lb (62.596 kg)  BMI 22.28  kg/m2  SpO2 98%  Opioid Risk Score:   Fall Risk Score:      Review of Systems  Constitutional: Positive for appetite change and unexpected weight change.  Respiratory: Positive for cough, shortness of breath and wheezing.   Gastrointestinal: Positive for nausea, vomiting, abdominal pain and constipation.  Genitourinary: Positive for dysuria and decreased urine volume.       Bowel and bladder control problems  Skin: Positive for rash.  Neurological: Positive for tremors, weakness and numbness.       Tingling, spasms  Psychiatric/Behavioral: Positive for confusion. The patient is nervous/anxious.        Depression  All other systems reviewed and are negative.      Objective:   Physical Exam  Nursing note and vitals reviewed. Constitutional: She is oriented to person, place, and time. She appears well-developed and well-nourished.  HENT:  Head:  Normocephalic and atraumatic.  Neck: Normal range of motion. Neck supple.  Cardiovascular: Normal rate and regular rhythm.   Pulmonary/Chest: Effort normal and breath sounds normal.  Musculoskeletal:  Normal Muscle Bulk and Muscle Strength Reveals: Upper Extremities: Full ROM and Muscle Strength 5/5 on the Right and Left Decreased ROM 45 Degrees and Muscle Strength 3/5 Left AC Joint Tenderness Left Spine of Scapula Tenderness Thoracic Hypersensitivity Lower Extremities: Full ROM and Muscle Strength 5/5 Arises from chair with ease   Neurological: She is alert and oriented to person, place, and time.  Skin: Skin is warm and dry.  Psychiatric: She has a normal mood and affect.          Assessment & Plan:  1. Post-traumatic arthritis left shoulder s/p 2 shoulder surgeries. Now with adhesive capsulitis left shoulder. :  Refilled:oxyCODONE 5/325mg  one tablet every 8 hours as needed. #70  2. Cervicalgia, spondylosis: Continue Current Medication Regime.Lidoderm patches, Robaxin, Naprosyn, and Lyrica  3. Right Iliotibial band syndrome: Continue exercise regime 4. Depression/insomnia/anxiety: Continue Lexapro and Xanax. Continue Ambien 5 mg HS #30.  5. Carpal Tunnel Syndrome?: Steroid Injection/ Consider EMG  20 minutes of face to face patient care time was spent during this isit. All questions were encouraged and answered.   F/U in 1 month

## 2014-05-23 ENCOUNTER — Encounter: Payer: Self-pay | Admitting: Gastroenterology

## 2014-06-16 ENCOUNTER — Encounter: Payer: Self-pay | Admitting: Family

## 2014-06-17 ENCOUNTER — Encounter: Payer: Medicare Other | Attending: Physical Medicine & Rehabilitation | Admitting: Physical Medicine & Rehabilitation

## 2014-06-17 ENCOUNTER — Encounter: Payer: Self-pay | Admitting: Physical Medicine & Rehabilitation

## 2014-06-17 VITALS — BP 137/64 | HR 95 | Resp 14 | Ht 66.5 in | Wt 141.4 lb

## 2014-06-17 DIAGNOSIS — Z5181 Encounter for therapeutic drug level monitoring: Secondary | ICD-10-CM

## 2014-06-17 DIAGNOSIS — M542 Cervicalgia: Secondary | ICD-10-CM | POA: Diagnosis present

## 2014-06-17 DIAGNOSIS — M7502 Adhesive capsulitis of left shoulder: Secondary | ICD-10-CM | POA: Diagnosis present

## 2014-06-17 DIAGNOSIS — M25512 Pain in left shoulder: Secondary | ICD-10-CM

## 2014-06-17 DIAGNOSIS — M5412 Radiculopathy, cervical region: Secondary | ICD-10-CM

## 2014-06-17 DIAGNOSIS — F411 Generalized anxiety disorder: Secondary | ICD-10-CM

## 2014-06-17 DIAGNOSIS — M7501 Adhesive capsulitis of right shoulder: Secondary | ICD-10-CM

## 2014-06-17 DIAGNOSIS — M7631 Iliotibial band syndrome, right leg: Secondary | ICD-10-CM | POA: Diagnosis present

## 2014-06-17 DIAGNOSIS — G8929 Other chronic pain: Secondary | ICD-10-CM | POA: Diagnosis present

## 2014-06-17 DIAGNOSIS — M7918 Myalgia, other site: Secondary | ICD-10-CM

## 2014-06-17 DIAGNOSIS — M791 Myalgia: Secondary | ICD-10-CM | POA: Diagnosis present

## 2014-06-17 DIAGNOSIS — M47812 Spondylosis without myelopathy or radiculopathy, cervical region: Secondary | ICD-10-CM

## 2014-06-17 DIAGNOSIS — G894 Chronic pain syndrome: Secondary | ICD-10-CM | POA: Diagnosis present

## 2014-06-17 DIAGNOSIS — Z79899 Other long term (current) drug therapy: Secondary | ICD-10-CM | POA: Diagnosis present

## 2014-06-17 MED ORDER — OXYCODONE-ACETAMINOPHEN 5-325 MG PO TABS: 1.0000 | ORAL_TABLET | Freq: Three times a day (TID) | ORAL | Status: DC | PRN

## 2014-06-17 MED ORDER — GABAPENTIN 300 MG PO CAPS: 300.0000 mg | ORAL_CAPSULE | Freq: Three times a day (TID) | ORAL | Status: DC

## 2014-06-17 NOTE — Patient Instructions (Signed)
GABAPENTIN: 300MG  AT NIGHT FOR 3 DAYS THEN 300MG  TWICE DAILY FOR 3 DAYS THEN THREE X DAILY THEREAFTER

## 2014-06-17 NOTE — Progress Notes (Signed)
Subjective:    Patient ID: Cassandra [name], female    DOB: April 04, 1956, 58 y.o.   MRN: [id number]  HPI  She has continued to have symptoms in her left hand/arm. There is tingling in the left hand and arm which is persistent. She went to therapy for awhile after I last saw her and the cervical traction collar was helpful. It didn't help the finger or arm however.   She is limited with movement in the left upper extremity due to her shoulder pain.   Pain Inventory Average Pain 7 Pain Right Now 8 My pain is constant, burning, dull, stabbing, tingling and aching  In the last 24 hours, has pain interfered with the following? General activity 6 Relation with others 8 Enjoyment of life 9 What TIME of day is your pain at its worst? morning and night Sleep (in general) Poor  Pain is worse with: bending, standing and some activites Pain improves with: heat/ice, pacing activities, medication and hot bath Relief from Meds: 5  Mobility walk without assistance  Function disabled: date disabled 2007  Neuro/Psych numbness tremor tingling spasms dizziness confusion anxiety  Prior Studies Any changes since last visit?  no  Physicians involved in your care Any changes since last visit?  no   Family History  Problem Relation Age of Onset  . Coronary artery disease Mother   . Diabetes Mother     borderline  . Coronary artery disease Father   . Prostate cancer Father   . Liver cancer Maternal Grandmother   . Pancreatic cancer Father   . Colon cancer      Both sides  . Liver disease Maternal Grandmother   . Kidney disease Mother     stage 4   History   Social History  . Marital Status: Married    Spouse Name: N/A    Number of Children: 1  . Years of Education: N/A   Occupational History  . Homemaker    Social History Main Topics  . Smoking status: Current Every Day Smoker -- 1.00 packs/day    Types: Cigarettes  . Smokeless tobacco: Never Used  . Alcohol Use: No    . Drug Use: No  . Sexual Activity: None   Other Topics Concern  . None   Social History Narrative  . None   Past Surgical History  Procedure Laterality Date  . Abdominal hysterectomy      age 65  . Bladder repair      sling  . Joint replacement      /knee  . Shoulder arthroscopy      multiple   Past Medical History  Diagnosis Date  . Nicotine addiction     2 packs per day   . Hyperlipidemia   . Chronic left shoulder pain     frozen   . Tubular adenoma of colon 01/31/2000    Dr. Richmond Campbell  . Maternal complication related to childbirth     Sepsis, staph  . Skin cancer     Rt. leg, squamous cell  . Anxiety   . Depression   . COPD (chronic obstructive pulmonary disease)     Dr. Redge Gainer   BP 137/64  Pulse 95  Resp 14  Ht 5' 6.5" (1.689 m)  Wt 141 lb 6.4 oz (64.139 kg)  BMI 22.48 kg/m2  SpO2 98%  Opioid Risk Score:   Fall Risk Score: Moderate Fall Risk (6-13 points)   Review of Systems  Objective:   Physical Exam  Review of Systems  Constitutional: Positive for unexpected weight change.  Respiratory: Positive for cough and wheezing.  Gastrointestinal: Positive for nausea and constipation.  Genitourinary: Positive for dysuria and decreased urine volume.  Musculoskeletal: Positive for neck pain.  Neurological: Positive for dizziness, tremors, weakness and numbness.  Tingling, spasms  Psychiatric/Behavioral: Positive for suicidal ideas, confusion and dysphoric mood. The patient is nervous/anxious.  All other systems reviewed and are negative.  Objective:   Physical Exam  General: Alert and oriented x 3, No apparent distress. Appears to be in good physical condition  HEENT: Head is normocephalic, atraumatic, PERRLA, EOMI, sclera anicteric, oral mucosa pink and moist, dentition intact, ext ear canals clear,  Neck: Supple without JVD or lymphadenopathy  Heart: Reg rate and rhythm. No murmurs rubs or gallops  Chest: CTA bilaterally without  wheezes, rales, or rhonchi; no distress  Abdomen: Soft, non-tender, non-distended, bowel sounds positive.  Extremities: No clubbing, cyanosis, or edema. Pulses are 2+  Skin: Clean and intact without signs of breakdown. Area of shoulder surgery noted.  Neuro: Pt is cognitively appropriate with normal insight, memory, and awareness. Cranial nerves 2-12 are intact. Sensory exam is normal. Reflexes are 2+ in all 4's. Fine motor coordination is intact. No tremors. Motor function is grossly 5/5. tinels sign positive at ulnar groove. Tinel's+ at elbow and wrist. Musculoskeletal: Left shoulder substantially limited with Abduction 40deg, flexion 45deg, ext 15 deg. She had pain along labrum,subacromial area and surrounding areas. No swelling or skin discoloration. Area is NOT alodynic. She has spasm along the left trap and SCM. Shoulder girdle positioning and head posture is fair.Marland Kitchen Spurling's test negative again.  Psych: Pt's affect is appropriate. Pt is cooperative a little anxious perhaps. alert   Assessment & Plan:   1. Post-traumatic arthritis left shoulder s/p 2 shoulder surgeries. Now with adhesive capsulitis left shoulder.  2. Cervicalgia, spondylosis particularly at C5-6 and C6-7. ?radic vs mononeuropathies in LUE, CRPS? 3. Right ITB syndrome  4. Depression/insomnia/anxiety   Plan:  1. MRI of cervical spine to rule out lower cervical radics. May need NCS/EMG also 2. Robaxin for muscle spasms  3. will try gabapentin due to cost considerations.  4. Naproxen 500mg  bid  5. ITB stretching and exercises  6. Discussed extension of the left elbow to avoid persistent paresthesias in ulnar hand.(again today)  7. Continue traction/posture exercises for neck  8. I will see her back in about a month. 30 minutes of face to face patient care time were spent during this visit. All questions were encouraged and answered. I agreed to continue filling her xanax, lexapro, naproxen, etc.  .

## 2014-07-05 ENCOUNTER — Other Ambulatory Visit: Payer: Medicare Other

## 2014-07-09 ENCOUNTER — Ambulatory Visit
Admission: RE | Admit: 2014-07-09 | Discharge: 2014-07-09 | Disposition: A | Discharge status: Home / Self Care | Payer: Medicare Other | Source: Ambulatory Visit | Attending: Physical Medicine & Rehabilitation | Admitting: Physical Medicine & Rehabilitation

## 2014-07-09 DIAGNOSIS — M5412 Radiculopathy, cervical region: Secondary | ICD-10-CM

## 2014-07-09 DIAGNOSIS — M47812 Spondylosis without myelopathy or radiculopathy, cervical region: Secondary | ICD-10-CM

## 2014-07-09 DIAGNOSIS — M25512 Pain in left shoulder: Secondary | ICD-10-CM

## 2014-07-15 ENCOUNTER — Encounter: Payer: Medicare Other | Attending: Physical Medicine & Rehabilitation | Admitting: Registered Nurse

## 2014-07-15 ENCOUNTER — Telehealth: Payer: Self-pay | Admitting: Physical Medicine & Rehabilitation

## 2014-07-15 ENCOUNTER — Encounter: Payer: Self-pay | Admitting: Registered Nurse

## 2014-07-15 ENCOUNTER — Other Ambulatory Visit: Payer: Self-pay | Admitting: Physical Medicine & Rehabilitation

## 2014-07-15 VITALS — BP 137/52 | HR 73 | Resp 14 | Ht 66.5 in | Wt 142.0 lb

## 2014-07-15 DIAGNOSIS — M7501 Adhesive capsulitis of right shoulder: Secondary | ICD-10-CM | POA: Diagnosis present

## 2014-07-15 DIAGNOSIS — Z79899 Other long term (current) drug therapy: Secondary | ICD-10-CM | POA: Diagnosis present

## 2014-07-15 DIAGNOSIS — M7502 Adhesive capsulitis of left shoulder: Secondary | ICD-10-CM | POA: Insufficient documentation

## 2014-07-15 DIAGNOSIS — G8929 Other chronic pain: Secondary | ICD-10-CM | POA: Diagnosis present

## 2014-07-15 DIAGNOSIS — G894 Chronic pain syndrome: Secondary | ICD-10-CM | POA: Insufficient documentation

## 2014-07-15 DIAGNOSIS — M791 Myalgia: Secondary | ICD-10-CM | POA: Diagnosis present

## 2014-07-15 DIAGNOSIS — Z5181 Encounter for therapeutic drug level monitoring: Secondary | ICD-10-CM | POA: Insufficient documentation

## 2014-07-15 DIAGNOSIS — F411 Generalized anxiety disorder: Secondary | ICD-10-CM | POA: Insufficient documentation

## 2014-07-15 DIAGNOSIS — M542 Cervicalgia: Secondary | ICD-10-CM | POA: Diagnosis present

## 2014-07-15 DIAGNOSIS — M7631 Iliotibial band syndrome, right leg: Secondary | ICD-10-CM | POA: Diagnosis present

## 2014-07-15 DIAGNOSIS — M47812 Spondylosis without myelopathy or radiculopathy, cervical region: Secondary | ICD-10-CM

## 2014-07-15 DIAGNOSIS — M7918 Myalgia, other site: Secondary | ICD-10-CM

## 2014-07-15 DIAGNOSIS — M25512 Pain in left shoulder: Secondary | ICD-10-CM | POA: Insufficient documentation

## 2014-07-15 MED ORDER — OXYCODONE-ACETAMINOPHEN 5-325 MG PO TABS: 1.0000 | ORAL_TABLET | Freq: Three times a day (TID) | ORAL | Status: DC | PRN

## 2014-07-15 NOTE — Telephone Encounter (Signed)
I reviewed her MRI: C4-5: Mild facet degeneration on the left. No significant stenosis  C5-6: Moderate disc degeneration with disc space narrowing and endplate edema related to disc degeneration. There is spondylosis causing mild spinal stenosis and mild foraminal stenosis bilaterally. No disc protrusion identified  C6-7: Disc degeneration and spondylosis causing mild spinal stenosis and mild foraminal stenosis bilaterally  Would continue with conservative management, traction, ROM, etc for now. I see nothing which would suggest nerve root involvement at this point

## 2014-07-15 NOTE — Progress Notes (Signed)
Subjective:    Patient ID: Cassandra [name], female    DOB: [birth year], 58 y.o.   MRN: [id number]  HPI: [name] is a 58 year old female who returns for follow up for chronic pain and medication refill. She says her pain is located in her neck, left shoulder, and left arm. She rates her pain 7. Her current exercise program is performing stretching exercises and walking.   Pain Inventory Average Pain 7 Pain Right Now 7 My pain is constant, burning, dull, stabbing, tingling and aching  In the last 24 hours, has pain interfered with the following? General activity 6 Relation with others 8 Enjoyment of life 9 What TIME of day is your pain at its worst? morning, night Sleep (in general) Poor  Pain is worse with: bending, sitting, standing and some activites Pain improves with: heat/ice, pacing activities and medication Relief from Meds: 5  Mobility walk without assistance how many minutes can you walk? ? ability to climb steps?  yes do you drive?  yes needs help with transfers  Function not employed: date last employed 2007 disabled: date disabled 2007 I need assistance with the following:  dressing, household duties and shopping  Neuro/Psych bowel control problems weakness numbness tremor tingling spasms dizziness confusion depression anxiety  Prior Studies Any changes since last visit?  yes  Physicians involved in your care Any changes since last visit?  no   Family History  Problem Relation Age of Onset  . Coronary artery disease Mother   . Diabetes Mother     borderline  . Coronary artery disease Father   . Prostate cancer Father   . Liver cancer Maternal Grandmother   . Pancreatic cancer Father   . Colon cancer      Both sides  . Liver disease Maternal Grandmother   . Kidney disease Mother     stage 4   History   Social History  . Marital Status: Married    Spouse Name: N/A    Number of Children: 1  . Years of Education: N/A    Occupational History  . Homemaker    Social History Main Topics  . Smoking status: Current Every Day Smoker -- 1.00 packs/day    Types: Cigarettes  . Smokeless tobacco: Never Used  . Alcohol Use: No  . Drug Use: No  . Sexual Activity: Not on file   Other Topics Concern  . Not on file   Social History Narrative  . No narrative on file   Past Surgical History  Procedure Laterality Date  . Abdominal hysterectomy      age 44  . Bladder repair      sling  . Joint replacement      /knee  . Shoulder arthroscopy      multiple   Past Medical History  Diagnosis Date  . Nicotine addiction     2 packs per day   . Hyperlipidemia   . Chronic left shoulder pain     frozen   . Tubular adenoma of colon 01/31/2000    Dr. Richmond Campbell  . Maternal complication related to childbirth     Sepsis, staph  . Skin cancer     Rt. leg, squamous cell  . Anxiety   . Depression   . COPD (chronic obstructive pulmonary disease)     Dr. Redge Gainer   There were no vitals taken for this visit.  Opioid Risk Score:   Fall Risk Score:  Review of Systems     Objective:   Physical Exam  Constitutional: She is oriented to person, place, and time. She appears well-developed and well-nourished.  HENT:  Head: Normocephalic and atraumatic.  Neck: Normal range of motion. Neck supple.  Cervical Paraspinal Tenderness: C-3- C-5  Cardiovascular: Normal rate and regular rhythm.   Pulmonary/Chest: Effort normal and breath sounds normal.  Musculoskeletal:  Normal Muscle Bulk and Muscle testing Reveals:  Upper Extremities: Right: Full ROM and Muscle Strength 5/5 Left Decreased ROM 45 Degrees. Decreased ROM with Extension/ Flexion 30 Degrees Thoracic paraspinal tenderness Lower Extremities: Full ROM and Muscle strength 5/5 Arises from chair with ease Narrow based gait   Neurological: She is alert and oriented to person, place, and time.  Skin: Skin is warm and dry.  Psychiatric: She has a  normal mood and affect.  Nursing note and vitals reviewed.         Assessment & Plan:  1. Post-traumatic arthritis left shoulder s/p 2 shoulder surgeries. Now with adhesive capsulitis left shoulder. :  Refilled:oxyCODONE 5/325mg  one tablet every 8 hours as needed. #70  2. Cervicalgia, spondylosis: Continue Current Medication Regime.Lidoderm patches, Robaxin, Naprosyn, and Gabapentin 3. Right Iliotibial band syndrome: Continue exercise regime 4. Depression/insomnia/anxiety: Continue Lexapro and Xanax. Continue Ambien 5 mg HS #30.    20 minutes of face to face patient care time was spent during this isit. All questions were encouraged and answered.

## 2014-07-16 LAB — PMP ALCOHOL METABOLITE (ETG): ETGU: NEGATIVE ng/mL

## 2014-07-16 NOTE — Telephone Encounter (Signed)
Cassandra [name] reviewd with her at appt yesterday.

## 2014-07-18 LAB — OXYCODONE, URINE (LC/MS-MS)
Noroxycodone, Ur: 635 ng/mL (ref <50)
Oxycodone, ur: 325 ng/mL (ref <50)
Oxymorphone: 341 ng/mL (ref <50)

## 2014-07-18 LAB — BENZODIAZEPINES (GC/LC/MS), URINE
Alprazolam metabolite (GC/LC/MS), ur confirm: 75 ng/mL (ref <25)
Clonazepam metabolite (GC/LC/MS), ur confirm: NEGATIVE ng/mL (ref <25)
FLURAZEPAMU: NEGATIVE ng/mL (ref <50)
Lorazepam (GC/LC/MS), ur confirm: NEGATIVE ng/mL (ref <50)
Midazolam (GC/LC/MS), ur confirm: NEGATIVE ng/mL (ref <50)
NORDIAZEPAMU: NEGATIVE ng/mL (ref <50)
OXAZEPAMU: NEGATIVE ng/mL (ref <50)
TEMAZEPAMU: NEGATIVE ng/mL (ref <50)
TRIAZOLAMU: NEGATIVE ng/mL (ref <50)

## 2014-07-19 LAB — PRESCRIPTION MONITORING PROFILE (SOLSTAS)
AMPHETAMINE/METH: NEGATIVE ng/mL
BUPRENORPHINE, URINE: NEGATIVE ng/mL
Barbiturate Screen, Urine: NEGATIVE ng/mL
Cannabinoid Scrn, Ur: NEGATIVE ng/mL
Carisoprodol, Urine: NEGATIVE ng/mL
Cocaine Metabolites: NEGATIVE ng/mL
Creatinine, Urine: 47.29 mg/dL (ref >20.0)
ECSTASY: NEGATIVE ng/mL
FENTANYL URINE: NEGATIVE ng/mL
Meperidine, Ur: NEGATIVE ng/mL
Methadone Screen, Urine: NEGATIVE ng/mL
NITRITES URINE, INITIAL: NEGATIVE ug/mL
OPIATE SCREEN, URINE: NEGATIVE ng/mL
PROPOXYPHENE: NEGATIVE ng/mL
Tapentadol, urine: NEGATIVE ng/mL
Tramadol Scrn, Ur: NEGATIVE ng/mL
Zolpidem, Urine: NEGATIVE ng/mL
pH, Initial: 5.4 pH (ref 4.5–8.9)

## 2014-08-03 ENCOUNTER — Other Ambulatory Visit: Payer: Self-pay | Admitting: Registered Nurse

## 2014-08-04 NOTE — Telephone Encounter (Signed)
Ambien called to pharmacy for refill per electronic request from pharmacy.

## 2014-08-13 ENCOUNTER — Encounter: Payer: Self-pay | Admitting: Registered Nurse

## 2014-08-13 ENCOUNTER — Encounter: Payer: Medicare Other | Attending: Physical Medicine & Rehabilitation | Admitting: Registered Nurse

## 2014-08-13 VITALS — BP 127/67 | HR 70 | Resp 14 | Wt 144.8 lb

## 2014-08-13 DIAGNOSIS — M542 Cervicalgia: Secondary | ICD-10-CM | POA: Insufficient documentation

## 2014-08-13 DIAGNOSIS — Z5181 Encounter for therapeutic drug level monitoring: Secondary | ICD-10-CM

## 2014-08-13 DIAGNOSIS — M7501 Adhesive capsulitis of right shoulder: Secondary | ICD-10-CM | POA: Diagnosis present

## 2014-08-13 DIAGNOSIS — M47812 Spondylosis without myelopathy or radiculopathy, cervical region: Secondary | ICD-10-CM

## 2014-08-13 DIAGNOSIS — M7502 Adhesive capsulitis of left shoulder: Secondary | ICD-10-CM

## 2014-08-13 DIAGNOSIS — G894 Chronic pain syndrome: Secondary | ICD-10-CM | POA: Insufficient documentation

## 2014-08-13 DIAGNOSIS — Z79899 Other long term (current) drug therapy: Secondary | ICD-10-CM | POA: Insufficient documentation

## 2014-08-13 DIAGNOSIS — R1013 Epigastric pain: Secondary | ICD-10-CM

## 2014-08-13 DIAGNOSIS — M25512 Pain in left shoulder: Secondary | ICD-10-CM | POA: Diagnosis present

## 2014-08-13 DIAGNOSIS — G8929 Other chronic pain: Secondary | ICD-10-CM

## 2014-08-13 DIAGNOSIS — M791 Myalgia: Secondary | ICD-10-CM | POA: Insufficient documentation

## 2014-08-13 DIAGNOSIS — M7631 Iliotibial band syndrome, right leg: Secondary | ICD-10-CM | POA: Insufficient documentation

## 2014-08-13 DIAGNOSIS — F411 Generalized anxiety disorder: Secondary | ICD-10-CM | POA: Diagnosis present

## 2014-08-13 DIAGNOSIS — M7918 Myalgia, other site: Secondary | ICD-10-CM

## 2014-08-13 DIAGNOSIS — F4322 Adjustment disorder with anxiety: Secondary | ICD-10-CM

## 2014-08-13 MED ORDER — OXYCODONE-ACETAMINOPHEN 5-325 MG PO TABS: 1.0000 | ORAL_TABLET | Freq: Three times a day (TID) | ORAL | Status: DC | PRN

## 2014-08-13 MED ORDER — ALPRAZOLAM 0.5 MG PO TABS: ORAL_TABLET | ORAL | Status: DC

## 2014-08-13 MED ORDER — METHOCARBAMOL 500 MG PO TABS: ORAL_TABLET | ORAL | Status: DC

## 2014-08-13 NOTE — Progress Notes (Signed)
Subjective:    Patient ID: Cassandra [name], female    DOB: March 05, 1956, 58 y.o.   MRN: [id number]  HPI: [name] is a 58 year old female who returns for follow up for chronic pain and medication refill. She says her pain is located in her neck and left arm. She rates her pain 7. Her current exercise regime is performing stretching exercises and walking.   Pain Inventory Average Pain 7 Pain Right Now 7 My pain is burning, dull, stabbing, tingling and aching  In the last 24 hours, has pain interfered with the following? General activity 7 Relation with others 9 Enjoyment of life 9 What TIME of day is your pain at its worst? morning night Sleep (in general) Poor  Pain is worse with: bending, sitting, standing and some activites Pain improves with: heat/ice, pacing activities and medication Relief from Meds: 5  Mobility walk without assistance do you drive?  yes  Function not employed: date last employed 2007 disabled: date disabled 2007 I need assistance with the following:  household duties and shopping  Neuro/Psych bowel control problems weakness numbness tremor tingling spasms dizziness confusion depression anxiety  Prior Studies Any changes since last visit?  no  Physicians involved in your care Any changes since last visit?  no   Family History  Problem Relation Age of Onset  . Coronary artery disease Mother   . Diabetes Mother     borderline  . Coronary artery disease Father   . Prostate cancer Father   . Liver cancer Maternal Grandmother   . Pancreatic cancer Father   . Colon cancer      Both sides  . Liver disease Maternal Grandmother   . Kidney disease Mother     stage 4   History   Social History  . Marital Status: Married    Spouse Name: N/A    Number of Children: 1  . Years of Education: N/A   Occupational History  . Homemaker    Social History Main Topics  . Smoking status: Current Every Day Smoker -- 1.00 packs/day   Types: Cigarettes  . Smokeless tobacco: Never Used  . Alcohol Use: No  . Drug Use: No  . Sexual Activity: None   Other Topics Concern  . None   Social History Narrative   Past Surgical History  Procedure Laterality Date  . Abdominal hysterectomy      age 30  . Bladder repair      sling  . Joint replacement      /knee  . Shoulder arthroscopy      multiple   Past Medical History  Diagnosis Date  . Nicotine addiction     2 packs per day   . Hyperlipidemia   . Chronic left shoulder pain     frozen   . Tubular adenoma of colon 01/31/2000    Dr. Richmond Campbell  . Maternal complication related to childbirth     Sepsis, staph  . Skin cancer     Rt. leg, squamous cell  . Anxiety   . Depression   . COPD (chronic obstructive pulmonary disease)     Dr. Redge Gainer   BP 127/67 mmHg  Pulse 70  Resp 14  Wt 144 lb 12.8 oz (65.681 kg)  SpO2 98%  Opioid Risk Score:   Fall Risk Score: Moderate Fall Risk (6-13 points) (previously educated and given handout) Review of Systems  Constitutional: Positive for diaphoresis, appetite change and unexpected weight  change.  Respiratory: Positive for cough.   Gastrointestinal: Positive for nausea and abdominal pain.  Genitourinary: Positive for difficulty urinating.  Skin: Positive for rash.  All other systems reviewed and are negative.      Objective:   Physical Exam  Constitutional: She is oriented to person, place, and time. She appears well-developed and well-nourished.  HENT:  Head: Normocephalic and atraumatic.  Neck: Normal range of motion. Neck supple.  Cardiovascular: Normal rate and regular rhythm.   Pulmonary/Chest: Effort normal and breath sounds normal.  Musculoskeletal:  Normal Muscle Bulk and Muscle Testing Reveals: Upper Extremities: Right Full ROM and Muscle Strength 5/5 Left Decreased ROM 45 Degrees Flexion unable to extend Lower Extremities: Full ROM and Muscle Strength 5/5 Arises from her chair with  ease Narrow Based gait  Neurological: She is alert and oriented to person, place, and time.  Skin: Skin is warm and dry.  Psychiatric: She has a normal mood and affect.  Nursing note and vitals reviewed.         Assessment & Plan:  1. Post-traumatic arthritis left shoulder s/p 2 shoulder surgeries. Now with adhesive capsulitis left shoulder. :  Refilled:oxyCODONE 5/325mg  one tablet every 8 hours as needed. #70  2. Cervicalgia, spondylosis: Continue Current Medication Regime.Lidoderm patches, Robaxin, Naprosyn, and Gabapentin 3. Right Iliotibial band syndrome: Continue exercise regime 4. Depression/insomnia/anxiety: Continue Lexapro and Xanax. Continue Ambien 5 mg HS #30.    20 minutes of face to face patient care time was spent during this isit. All questions were encouraged and answered.

## 2014-08-29 ENCOUNTER — Other Ambulatory Visit: Payer: Self-pay | Admitting: Registered Nurse

## 2014-09-12 ENCOUNTER — Other Ambulatory Visit: Payer: Self-pay | Admitting: Family

## 2014-09-16 ENCOUNTER — Encounter: Payer: Self-pay | Admitting: Registered Nurse

## 2014-09-16 ENCOUNTER — Encounter: Payer: Medicare Other | Attending: Physical Medicine & Rehabilitation | Admitting: Registered Nurse

## 2014-09-16 VITALS — BP 101/50 | HR 119 | Resp 14

## 2014-09-16 DIAGNOSIS — M7631 Iliotibial band syndrome, right leg: Secondary | ICD-10-CM | POA: Diagnosis not present

## 2014-09-16 DIAGNOSIS — G8929 Other chronic pain: Secondary | ICD-10-CM | POA: Diagnosis not present

## 2014-09-16 DIAGNOSIS — Z79899 Other long term (current) drug therapy: Secondary | ICD-10-CM | POA: Diagnosis not present

## 2014-09-16 DIAGNOSIS — Z5181 Encounter for therapeutic drug level monitoring: Secondary | ICD-10-CM

## 2014-09-16 DIAGNOSIS — M7502 Adhesive capsulitis of left shoulder: Secondary | ICD-10-CM | POA: Insufficient documentation

## 2014-09-16 DIAGNOSIS — M791 Myalgia: Secondary | ICD-10-CM | POA: Diagnosis not present

## 2014-09-16 DIAGNOSIS — M542 Cervicalgia: Secondary | ICD-10-CM | POA: Diagnosis not present

## 2014-09-16 DIAGNOSIS — F411 Generalized anxiety disorder: Secondary | ICD-10-CM | POA: Diagnosis not present

## 2014-09-16 DIAGNOSIS — M47812 Spondylosis without myelopathy or radiculopathy, cervical region: Secondary | ICD-10-CM

## 2014-09-16 DIAGNOSIS — M7918 Myalgia, other site: Secondary | ICD-10-CM

## 2014-09-16 DIAGNOSIS — G894 Chronic pain syndrome: Secondary | ICD-10-CM | POA: Diagnosis not present

## 2014-09-16 DIAGNOSIS — M7501 Adhesive capsulitis of right shoulder: Secondary | ICD-10-CM | POA: Insufficient documentation

## 2014-09-16 DIAGNOSIS — M25512 Pain in left shoulder: Secondary | ICD-10-CM | POA: Diagnosis not present

## 2014-09-16 MED ORDER — OXYCODONE-ACETAMINOPHEN 5-325 MG PO TABS: 1.0000 | ORAL_TABLET | Freq: Three times a day (TID) | ORAL | Status: DC | PRN

## 2014-09-16 NOTE — Progress Notes (Signed)
Subjective:    Patient ID: Cassandra [name], female    DOB: [birth year], 59 y.o.   MRN: [id number]  HPI: Cassandra [name] is a 59 year old female who returns for follow up for chronic pain and medication refill. She says her pain is located in her neck and left arm. She rates her pain 7. Her current exercise regime is performing stretching exercises and walking.  She was asking for increase in her pain medication due to increase intensity of pain with weather changes. Encouraged to use heat and ice therapy and medication regime. She verbalizes understanding. Arrived to office tachycardia, she says she has been noticing her heart rate racing with increase activity, apical pulse checked 80. Encouraged to follow up with PCP and make an appointment. She verbalizes understanding.   Pain Inventory Average Pain 7 Pain Right Now 7 My pain is burning, dull, stabbing, tingling and aching  In the last 24 hours, has pain interfered with the following? General activity 6 Relation with others 9 Enjoyment of life 9 What TIME of day is your pain at its worst? morning, night Sleep (in general) Poor  Pain is worse with: bending, sitting, standing and some activites Pain improves with: heat/ice and medication Relief from Meds: 5  Mobility walk without assistance ability to climb steps?  yes do you drive?  yes needs help with transfers  Function not employed: date last employed 2007 disabled: date disabled 2007 I need assistance with the following:  dressing, household duties and shopping  Neuro/Psych bowel control problems weakness numbness tremor tingling spasms dizziness confusion depression anxiety  Prior Studies Any changes since last visit?  no  Physicians involved in your care Any changes since last visit?  no   Family History  Problem Relation Age of Onset  . Coronary artery disease Mother   . Diabetes Mother     borderline  . Coronary artery disease Father   .  Prostate cancer Father   . Liver cancer Maternal Grandmother   . Pancreatic cancer Father   . Colon cancer      Both sides  . Liver disease Maternal Grandmother   . Kidney disease Mother     stage 4   History   Social History  . Marital Status: Married    Spouse Name: N/A    Number of Children: 1  . Years of Education: N/A   Occupational History  . Homemaker    Social History Main Topics  . Smoking status: Current Every Day Smoker -- 1.00 packs/day    Types: Cigarettes  . Smokeless tobacco: Never Used  . Alcohol Use: No  . Drug Use: No  . Sexual Activity: None   Other Topics Concern  . None   Social History Narrative   Past Surgical History  Procedure Laterality Date  . Abdominal hysterectomy      age 22  . Bladder repair      sling  . Joint replacement      /knee  . Shoulder arthroscopy      multiple   Past Medical History  Diagnosis Date  . Nicotine addiction     2 packs per day   . Hyperlipidemia   . Chronic left shoulder pain     frozen   . Tubular adenoma of colon 01/31/2000    Dr. Richmond Campbell  . Maternal complication related to childbirth     Sepsis, staph  . Skin cancer     Rt. leg, squamous  cell  . Anxiety   . Depression   . COPD (chronic obstructive pulmonary disease)     Dr. Redge Gainer   BP 101/50 mmHg  Pulse 119  Resp 14  SpO2 99%  Opioid Risk Score:   Fall Risk Score: Moderate Fall Risk (6-13 points)  Review of Systems  Constitutional: Positive for appetite change.       Night sweats Weight loss Poor appetite   Respiratory: Positive for cough, shortness of breath and wheezing.   Gastrointestinal: Positive for nausea, abdominal pain and constipation.  Skin: Positive for rash.  Neurological: Positive for dizziness, tremors, weakness and numbness.       Tingling Spasms   Psychiatric/Behavioral: Positive for confusion and dysphoric mood. The patient is nervous/anxious.   All other systems reviewed and are negative.       Objective:   Physical Exam  Constitutional: She is oriented to person, place, and time. She appears well-developed and well-nourished.  HENT:  Head: Normocephalic and atraumatic.  Neck: Normal range of motion. Neck supple.  Cardiovascular: Normal rate and regular rhythm.   Pulmonary/Chest: Effort normal and breath sounds normal.  Musculoskeletal:  Normal Muscle Bulk and Muscle testing Reveals: Upper extremities: Right Full ROM and Muscle strength 5/5 Left decreased ROM 30 Degrees Unable to Extend Lower extremities: Full ROM and Muscle strength 5/5 Arises from chair with ease Narrow based gait  Neurological: She is alert and oriented to person, place, and time.  Skin: Skin is warm and dry.  Psychiatric: She has a normal mood and affect.  Nursing note and vitals reviewed.         Assessment & Plan:  1. Post-traumatic arthritis left shoulder s/p 2 shoulder surgeries. Now with adhesive capsulitis left shoulder. :  Refilled:oxyCODONE 5/325mg  one tablet every 8 hours as needed. #70  2. Cervicalgia, spondylosis: Continue Current Medication Regime.Lidoderm patches, Robaxin, Naprosyn, and Gabapentin 3. Right Iliotibial band syndrome: Continue exercise regime 4. Depression/insomnia/anxiety: Continue Lexapro and Xanax. Continue Ambien 5 mg HS #30.    20 minutes of face to face patient care time was spent during this isit. All questions were encouraged and answered.

## 2014-09-21 ENCOUNTER — Other Ambulatory Visit: Payer: Self-pay | Admitting: Registered Nurse

## 2014-10-01 ENCOUNTER — Encounter: Payer: Self-pay | Admitting: Family Medicine

## 2014-10-01 ENCOUNTER — Ambulatory Visit (INDEPENDENT_AMBULATORY_CARE_PROVIDER_SITE_OTHER): Payer: Medicare Other | Admitting: Family Medicine

## 2014-10-01 ENCOUNTER — Ambulatory Visit (INDEPENDENT_AMBULATORY_CARE_PROVIDER_SITE_OTHER): Payer: Medicare Other

## 2014-10-01 VITALS — BP 131/75 | HR 85 | Temp 97.1°F | Ht 67.0 in | Wt 148.0 lb

## 2014-10-01 DIAGNOSIS — R002 Palpitations: Secondary | ICD-10-CM | POA: Diagnosis not present

## 2014-10-01 DIAGNOSIS — F4322 Adjustment disorder with anxiety: Secondary | ICD-10-CM

## 2014-10-01 DIAGNOSIS — M7502 Adhesive capsulitis of left shoulder: Secondary | ICD-10-CM

## 2014-10-01 DIAGNOSIS — M7918 Myalgia, other site: Secondary | ICD-10-CM

## 2014-10-01 DIAGNOSIS — M25512 Pain in left shoulder: Secondary | ICD-10-CM

## 2014-10-01 DIAGNOSIS — R0602 Shortness of breath: Secondary | ICD-10-CM

## 2014-10-01 DIAGNOSIS — G8929 Other chronic pain: Secondary | ICD-10-CM

## 2014-10-01 DIAGNOSIS — M542 Cervicalgia: Secondary | ICD-10-CM

## 2014-10-01 DIAGNOSIS — M791 Myalgia: Secondary | ICD-10-CM

## 2014-10-01 DIAGNOSIS — Z5181 Encounter for therapeutic drug level monitoring: Secondary | ICD-10-CM

## 2014-10-01 DIAGNOSIS — Z79899 Other long term (current) drug therapy: Secondary | ICD-10-CM

## 2014-10-01 DIAGNOSIS — G894 Chronic pain syndrome: Secondary | ICD-10-CM

## 2014-10-01 DIAGNOSIS — M7501 Adhesive capsulitis of right shoulder: Secondary | ICD-10-CM

## 2014-10-01 DIAGNOSIS — M7631 Iliotibial band syndrome, right leg: Secondary | ICD-10-CM

## 2014-10-01 NOTE — Progress Notes (Signed)
Subjective:    Patient ID: Cassandra [name], female    DOB: December 21, 1955, 59 y.o.   MRN: [id number]  HPI  Patient is here today for palpitations and fluctuating blood pressures. Patient was seen today in follow-up from a recent trip to pain management. At pain management she was noted to have some wooziness and orthostatic blood pressure changes. Orthostatic Blood pressure readings today were  found to be consistent standing seating and laying down without a change in pulse. The patient is having problems primarily with palpitations she says they occur about twice a day she'll notice a very rapid heartbeat. It is not associated with chest pain. There is some transient shortness of breath associated. She has no fainting noted. She is a smoker. She does drink caffeinated beverages 3 cups of coffee plus sodas daily. Currently she is under the care of pain management for neck pain with left arm radiculopathy for which she receives oxycodone. See attached med list. This complicates her concerned that the one daily Xanax is not sufficient. She is having more anxiety which she describes as worry irritability and agitation plus poor sleep. She is only sleeping about 3-4 hours a night and lays awake and ruminates during the rest of the night.      Review of Systems  Constitutional: Negative for fever, chills, diaphoresis, appetite change, fatigue and unexpected weight change.  HENT: Negative for congestion, ear pain, hearing loss, postnasal drip, rhinorrhea, sneezing, sore throat and trouble swallowing.   Eyes: Negative for pain.  Respiratory:       See history of present illness  Cardiovascular: Negative for leg swelling.  Gastrointestinal: Negative for nausea, vomiting, abdominal pain, diarrhea and constipation.  Genitourinary: Negative for dysuria, frequency and menstrual problem.  Musculoskeletal: Negative for joint swelling and arthralgias.  Skin: Negative for rash.  Neurological: Negative for  dizziness, weakness, numbness and headaches.  Psychiatric/Behavioral: Negative for dysphoric mood and agitation.                Patient Active Problem List   Diagnosis Date Noted  . C7 radiculopathy 06/17/2014  . Cervical spondylosis without myelopathy 12/18/2013  . Iliotibial band syndrome of right side 09/20/2013  . Adhesive capsulitis of left shoulder 09/20/2013  . Degenerative arthritis of left shoulder region 09/20/2013  . Myofascial pain 09/20/2013  . Cervicalgia 09/20/2013  . Personal history of colonic polyps 02/07/2013  . Abdominal pain, epigastric 01/11/2013  . Loss of weight 01/11/2013  . Smoker 01/11/2013  . Nicotine addiction 11/29/2010  . Hyperlipidemia 11/29/2010  . Chronic left shoulder pain 11/29/2010   Outpatient Encounter Prescriptions as of 10/01/2014  Medication Sig  . ALPRAZolam (XANAX) 0.5 MG tablet Take one tablet twice a day as needed for anxiety.  Marland Kitchen aspirin EC 81 MG tablet Take 81 mg by mouth daily.  . Bisacodyl (DULCOLAX PO) Take by mouth.  . Cholecalciferol (VITAMIN D) 1000 UNITS capsule Take 2,000 Units by mouth 2 (two) times daily.   Marland Kitchen conjugated estrogens (PREMARIN) vaginal cream Place vaginally daily.    Marland Kitchen escitalopram (LEXAPRO) 10 MG tablet TAKE 1 TABLET (10 MG TOTAL) BY MOUTH DAILY.  . fexofenadine (ALLEGRA) 180 MG tablet Take 180 mg by mouth daily as needed.    . gabapentin (NEURONTIN) 300 MG capsule Take 1 capsule (300 mg total) by mouth 3 (three) times daily.  Marland Kitchen lidocaine (LIDODERM) 5 % Place 1 patch onto the skin as needed. Remove & Discard patch within 12 hours or as directed by MD   .  meclizine (ANTIVERT) 25 MG tablet TAKE 1 TABLET (25 MG TOTAL) BY MOUTH 2 (TWO) TIMES DAILY AS NEEDED.  . methocarbamol (ROBAXIN) 500 MG tablet TAKE 1 TABLET (500 MG TOTAL) BY MOUTH EVERY 6 (SIX) HOURS AS NEEDED FOR MUSCLE SPASMS.  . naproxen (NAPROSYN) 500 MG tablet TAKE 1 TABLET (500 MG TOTAL) BY MOUTH 2 (TWO) TIMES DAILY WITH A MEAL.  Marland Kitchen omeprazole  (PRILOSEC) 20 MG capsule TAKE 1 CAPSULE (20 MG TOTAL) BY MOUTH DAILY.  Marland Kitchen oxyCODONE-acetaminophen (PERCOCET) 5-325 MG per tablet Take 1 tablet by mouth every 8 (eight) hours as needed.  . phenazopyridine (PYRIDIUM) 200 MG tablet Take 200 mg by mouth 3 (three) times daily as needed for pain.  Marland Kitchen zolpidem (AMBIEN) 5 MG tablet TAKE 1 TABLET BY MOUTH AT BEDTIME AS NEEDED FOR SLEEP      Objective:   Physical Exam  Constitutional: She is oriented to person, place, and time. She appears well-developed and well-nourished. No distress.  HENT:  Head: Normocephalic and atraumatic.  Right Ear: External ear normal.  Left Ear: External ear normal.  Nose: Nose normal.  Mouth/Throat: Oropharynx is clear and moist.  Eyes: Conjunctivae and EOM are normal. Pupils are equal, round, and reactive to light.  Neck: Normal range of motion. Neck supple. No thyromegaly present.  Cardiovascular: Normal rate, regular rhythm and normal heart sounds.   No murmur heard. Pulmonary/Chest: Effort normal and breath sounds normal. No respiratory distress. She has no wheezes. She has no rales.  Abdominal: Soft. Bowel sounds are normal. She exhibits no distension. There is no tenderness.  Lymphadenopathy:    She has no cervical adenopathy.  Neurological: She is alert and oriented to person, place, and time. She has normal reflexes.  Skin: Skin is warm and dry.  Psychiatric: She has a normal mood and affect. Her behavior is normal. Judgment and thought content normal.   BP 131/75 mmHg  Pulse 85  Temp(Src) 97.1 F (36.2 C) (Oral)  Ht 5\' 7"  (1.702 m)  Wt 148 lb (67.132 kg)  BMI 23.17 kg/m2        Assessment & Plan:  1. SOB (shortness of breath) Primarily during her palpitations. However she is a smoker and has been told in the past she has COPD. - Holter monitor - 48 hour - Spirometry with graph - DG Chest 2 View  2. Palpitations Twice daily occurrences without chest pain or syncope - EKG 12-Lead - Holter  monitor - 48 hour  3. Adjustment disorder with anxious mood Will increase the Xanax to twice a day pending approval of her pain management physician due to ongoing contract with that group.  6. Cervicalgia Source of pain for which she takes oxycodone

## 2014-10-03 DIAGNOSIS — R002 Palpitations: Secondary | ICD-10-CM | POA: Diagnosis not present

## 2014-10-11 ENCOUNTER — Other Ambulatory Visit: Payer: Self-pay | Admitting: Physical Medicine & Rehabilitation

## 2014-10-15 ENCOUNTER — Ambulatory Visit: Payer: PRIVATE HEALTH INSURANCE | Admitting: Registered Nurse

## 2014-10-15 ENCOUNTER — Other Ambulatory Visit: Payer: Self-pay | Admitting: Family Medicine

## 2014-10-15 MED ORDER — METOPROLOL SUCCINATE ER 50 MG PO TB24: 50.0000 mg | ORAL_TABLET | Freq: Every day | ORAL | Status: DC

## 2014-10-16 ENCOUNTER — Encounter: Payer: Medicare Other | Attending: Physical Medicine & Rehabilitation | Admitting: Registered Nurse

## 2014-10-16 ENCOUNTER — Encounter: Payer: Self-pay | Admitting: Registered Nurse

## 2014-10-16 VITALS — BP 134/78 | HR 72 | Resp 14

## 2014-10-16 DIAGNOSIS — M7502 Adhesive capsulitis of left shoulder: Secondary | ICD-10-CM | POA: Diagnosis not present

## 2014-10-16 DIAGNOSIS — M7501 Adhesive capsulitis of right shoulder: Secondary | ICD-10-CM | POA: Insufficient documentation

## 2014-10-16 DIAGNOSIS — M791 Myalgia: Secondary | ICD-10-CM | POA: Insufficient documentation

## 2014-10-16 DIAGNOSIS — G894 Chronic pain syndrome: Secondary | ICD-10-CM | POA: Diagnosis not present

## 2014-10-16 DIAGNOSIS — Z79899 Other long term (current) drug therapy: Secondary | ICD-10-CM | POA: Insufficient documentation

## 2014-10-16 DIAGNOSIS — G8929 Other chronic pain: Secondary | ICD-10-CM

## 2014-10-16 DIAGNOSIS — M542 Cervicalgia: Secondary | ICD-10-CM | POA: Diagnosis not present

## 2014-10-16 DIAGNOSIS — M25512 Pain in left shoulder: Secondary | ICD-10-CM | POA: Diagnosis not present

## 2014-10-16 DIAGNOSIS — Z5181 Encounter for therapeutic drug level monitoring: Secondary | ICD-10-CM | POA: Diagnosis not present

## 2014-10-16 DIAGNOSIS — F411 Generalized anxiety disorder: Secondary | ICD-10-CM | POA: Insufficient documentation

## 2014-10-16 DIAGNOSIS — M7918 Myalgia, other site: Secondary | ICD-10-CM

## 2014-10-16 DIAGNOSIS — M7631 Iliotibial band syndrome, right leg: Secondary | ICD-10-CM | POA: Diagnosis not present

## 2014-10-16 DIAGNOSIS — M47812 Spondylosis without myelopathy or radiculopathy, cervical region: Secondary | ICD-10-CM

## 2014-10-16 MED ORDER — OXYCODONE-ACETAMINOPHEN 5-325 MG PO TABS: 1.0000 | ORAL_TABLET | Freq: Three times a day (TID) | ORAL | Status: DC | PRN

## 2014-10-16 NOTE — Progress Notes (Signed)
Pt notified of results

## 2014-10-16 NOTE — Progress Notes (Signed)
Subjective:    Patient ID: Cassandra [name], female    DOB: December 07, 1955, 59 y.o.   MRN: [id number]  HPI: [name] is a 59 year old female who returns for follow up for chronic pain and medication refill. She says her pain is located in her neck, left shoulder and left arm. She rates her pain 8. Her current exercise regime is walking.  She seen her PCP for tachycardia, she wore a holter monitor for a few days. Was diagnosed with accelerated heart rate she says.  Pain Inventory Average Pain 7 Pain Right Now 8 My pain is constant, sharp, dull, stabbing, tingling and aching  In the last 24 hours, has pain interfered with the following? General activity 7 Relation with others 8 Enjoyment of life 8 What TIME of day is your pain at its worst? morning and night Sleep (in general) Poor  Pain is worse with: walking, bending, sitting, standing and some activites Pain improves with: heat/ice and medication Relief from Meds: 5  Mobility walk without assistance how many minutes can you walk? 15 ability to climb steps?  yes do you drive?  yes  Function disabled: date disabled .  Neuro/Psych bladder control problems weakness numbness tremor tingling spasms dizziness confusion depression anxiety  Prior Studies Any changes since last visit?  no  Physicians involved in your care Any changes since last visit?  no   Family History  Problem Relation Age of Onset  . Coronary artery disease Mother   . Diabetes Mother     borderline  . Coronary artery disease Father   . Prostate cancer Father   . Liver cancer Maternal Grandmother   . Pancreatic cancer Father   . Colon cancer      Both sides  . Liver disease Maternal Grandmother   . Kidney disease Mother     stage 4   History   Social History  . Marital Status: Married    Spouse Name: N/A    Number of Children: 1  . Years of Education: N/A   Occupational History  . Homemaker    Social History Main Topics    . Smoking status: Current Every Day Smoker -- 1.00 packs/day    Types: Cigarettes  . Smokeless tobacco: Never Used  . Alcohol Use: No  . Drug Use: No  . Sexual Activity: None   Other Topics Concern  . None   Social History Narrative   Past Surgical History  Procedure Laterality Date  . Abdominal hysterectomy      age 89  . Bladder repair      sling  . Joint replacement      /knee  . Shoulder arthroscopy      multiple   Past Medical History  Diagnosis Date  . Nicotine addiction     2 packs per day   . Hyperlipidemia   . Chronic left shoulder pain     frozen   . Tubular adenoma of colon 01/31/2000    Dr. Richmond Campbell  . Maternal complication related to childbirth     Sepsis, staph  . Skin cancer     Rt. leg, squamous cell  . Anxiety   . Depression   . COPD (chronic obstructive pulmonary disease)     Dr. Redge Gainer   BP 134/78 mmHg  Pulse 72  Resp 14  SpO2 99%  Opioid Risk Score:   Fall Risk Score: Moderate Fall Risk (6-13 points)  Review of Systems  HENT: Negative.   Eyes: Negative.   Respiratory: Negative.   Cardiovascular: Negative.   Gastrointestinal: Negative.   Endocrine: Negative.   Genitourinary:       Bladder control problems  Musculoskeletal: Positive for myalgias and joint swelling.  Skin: Negative.   Allergic/Immunologic: Negative.   Neurological: Positive for dizziness, weakness and numbness.  Hematological: Negative.   Psychiatric/Behavioral: Positive for confusion and dysphoric mood. The patient is nervous/anxious.        Objective:   Physical Exam  Constitutional: She is oriented to person, place, and time. She appears well-developed and well-nourished.  HENT:  Head: Normocephalic and atraumatic.  Neck: Normal range of motion. Neck supple.  Cardiovascular: Normal rate and regular rhythm.   Pulmonary/Chest: Effort normal and breath sounds normal.  Musculoskeletal:  Normal Muscle Bulk and Muscle Testing Reveals: Upper  Extremities: Right Full ROM and Muscle Strength 5/5 Left: Decreased ROM 20 Degree and Muscle strength 4/5 Thoracic Hypersensitivity mainly Left Side Lower Extremities: Full ROM and Muscle strength 5/5 Arises from chair with ease Narrow Based gait  Neurological: She is alert and oriented to person, place, and time.  Skin: Skin is warm and dry.  Psychiatric: She has a normal mood and affect.  Nursing note and vitals reviewed.         Assessment & Plan:  1. Post-traumatic arthritis left shoulder s/p 2 shoulder surgeries. Now with adhesive capsulitis left shoulder. :  Refilled:oxyCODONE 5/325mg  one tablet every 8 hours as needed. #70  2. Cervicalgia, spondylosis: Continue Current Medication Regime.Lidoderm patches, Robaxin, Naprosyn, and Gabapentin 3. Right Iliotibial band syndrome: Continue exercise regime 4. Depression/insomnia/anxiety: Continue Lexapro and Xanax. Continue Ambien 5 mg HS #30.    20 minutes of face to face patient care time was spent during this isit. All questions were encouraged and answered.

## 2014-10-21 ENCOUNTER — Telehealth: Payer: Self-pay | Admitting: Family Medicine

## 2014-10-21 NOTE — Telephone Encounter (Signed)
Patient not sure why rx for metoprolol was sent into pharmacy.

## 2014-10-23 NOTE — Telephone Encounter (Signed)
Monitor showed that her palpitations were harmless. However, medication can help control them if they are uncomfortable. Therefore I sent in a low dose of metoprolol to help prevent them. If the patient feels that they are not bothersome then she does not have to take the medicine. Thanks, WS.

## 2014-10-24 NOTE — Telephone Encounter (Signed)
Patient is going to hold off on taking the Toprol for now. We did discuss dehydration as a possible cause for orthostatic hypotension. Advised that increasing water intake and decreasing caffeine intake may help with that. She will let us know if symptoms worsen or persist.

## 2014-10-31 ENCOUNTER — Other Ambulatory Visit: Payer: Self-pay | Admitting: Physical Medicine & Rehabilitation

## 2014-10-31 ENCOUNTER — Other Ambulatory Visit: Payer: Self-pay | Admitting: Family Medicine

## 2014-11-04 ENCOUNTER — Other Ambulatory Visit: Payer: Self-pay | Admitting: Registered Nurse

## 2014-11-04 NOTE — Telephone Encounter (Signed)
Refilled [employer]. Further refills to be cone at appt with NP

## 2014-11-11 ENCOUNTER — Other Ambulatory Visit: Payer: Self-pay | Admitting: Family

## 2014-11-14 ENCOUNTER — Other Ambulatory Visit: Payer: Self-pay | Admitting: Family

## 2014-11-17 ENCOUNTER — Encounter: Payer: Medicare Other | Attending: Physical Medicine & Rehabilitation | Admitting: Registered Nurse

## 2014-11-17 ENCOUNTER — Encounter: Payer: Self-pay | Admitting: Registered Nurse

## 2014-11-17 ENCOUNTER — Telehealth: Payer: Self-pay | Admitting: Family Medicine

## 2014-11-17 VITALS — BP 140/64 | HR 90 | Resp 14

## 2014-11-17 DIAGNOSIS — M47812 Spondylosis without myelopathy or radiculopathy, cervical region: Secondary | ICD-10-CM

## 2014-11-17 DIAGNOSIS — M542 Cervicalgia: Secondary | ICD-10-CM

## 2014-11-17 DIAGNOSIS — M791 Myalgia: Secondary | ICD-10-CM | POA: Diagnosis not present

## 2014-11-17 DIAGNOSIS — Z5181 Encounter for therapeutic drug level monitoring: Secondary | ICD-10-CM | POA: Diagnosis not present

## 2014-11-17 DIAGNOSIS — G8929 Other chronic pain: Secondary | ICD-10-CM | POA: Diagnosis not present

## 2014-11-17 DIAGNOSIS — M7502 Adhesive capsulitis of left shoulder: Secondary | ICD-10-CM | POA: Insufficient documentation

## 2014-11-17 DIAGNOSIS — M7918 Myalgia, other site: Secondary | ICD-10-CM

## 2014-11-17 DIAGNOSIS — M25512 Pain in left shoulder: Secondary | ICD-10-CM | POA: Diagnosis not present

## 2014-11-17 DIAGNOSIS — G894 Chronic pain syndrome: Secondary | ICD-10-CM | POA: Diagnosis not present

## 2014-11-17 DIAGNOSIS — F411 Generalized anxiety disorder: Secondary | ICD-10-CM | POA: Insufficient documentation

## 2014-11-17 DIAGNOSIS — M7631 Iliotibial band syndrome, right leg: Secondary | ICD-10-CM

## 2014-11-17 DIAGNOSIS — Z79899 Other long term (current) drug therapy: Secondary | ICD-10-CM | POA: Diagnosis not present

## 2014-11-17 DIAGNOSIS — M7501 Adhesive capsulitis of right shoulder: Secondary | ICD-10-CM | POA: Diagnosis not present

## 2014-11-17 MED ORDER — OXYCODONE-ACETAMINOPHEN 5-325 MG PO TABS: 1.0000 | ORAL_TABLET | Freq: Three times a day (TID) | ORAL | Status: DC | PRN

## 2014-11-17 NOTE — Progress Notes (Signed)
Subjective:    Patient ID: Cassandra [name], female    DOB: [birth year], 59 y.o.   MRN: [id number]  HPI: Cassandra [name] is a 59 year old female who returns for follow up for chronic pain and medication refill. She says her pain is located in her left shoulder and left arm. She rates her pain 8. Her current exercise regime is walking. Her PCP ordered metoprolol for tachycardia she's not taking, encouraged to speak with her PCP she verbalizes understanding.  Pain Inventory Average Pain 8 Pain Right Now 8 My pain is burning, dull, stabbing, tingling and aching  In the last 24 hours, has pain interfered with the following? General activity 7 Relation with others 9 Enjoyment of life 9 What TIME of day is your pain at its worst? morning, evening, night Sleep (in general) Poor  Pain is worse with: bending, sitting, standing and some activites Pain improves with: heat/ice, pacing activities, medication and hot baths Relief from Meds: 5  Mobility walk without assistance ability to climb steps?  yes do you drive?  yes needs help with transfers  Function not employed: date last employed . disabled: date disabled . I need assistance with the following:  meal prep, household duties and shopping  Neuro/Psych bladder control problems bowel control problems weakness numbness tremor tingling spasms dizziness confusion depression anxiety  Prior Studies Any changes since last visit?  no  Physicians involved in your care Any changes since last visit?  no   Family History  Problem Relation Age of Onset  . Coronary artery disease Mother   . Diabetes Mother     borderline  . Coronary artery disease Father   . Prostate cancer Father   . Liver cancer Maternal Grandmother   . Pancreatic cancer Father   . Colon cancer      Both sides  . Liver disease Maternal Grandmother   . Kidney disease Mother     stage 4   History   Social History  . Marital Status: Married   Spouse Name: N/A  . Number of Children: 1  . Years of Education: N/A   Occupational History  . Homemaker    Social History Main Topics  . Smoking status: Current Every Day Smoker -- 1.00 packs/day    Types: Cigarettes  . Smokeless tobacco: Never Used  . Alcohol Use: No  . Drug Use: No  . Sexual Activity: Not on file   Other Topics Concern  . None   Social History Narrative   Past Surgical History  Procedure Laterality Date  . Abdominal hysterectomy      age 72  . Bladder repair      sling  . Joint replacement      /knee  . Shoulder arthroscopy      multiple   Past Medical History  Diagnosis Date  . Nicotine addiction     2 packs per day   . Hyperlipidemia   . Chronic left shoulder pain     frozen   . Tubular adenoma of colon 01/31/2000    Dr. Richmond Campbell  . Maternal complication related to childbirth     Sepsis, staph  . Skin cancer     Rt. leg, squamous cell  . Anxiety   . Depression   . COPD (chronic obstructive pulmonary disease)     Dr. Redge Gainer   There were no vitals taken for this visit.  Opioid Risk Score:   Fall Risk Score: Low Fall  Risk (0-5 points) (patient declined pamphlet)  Review of Systems  Constitutional:       Night sweats Poor appetite Weight loss   Respiratory: Positive for cough, shortness of breath and wheezing.   Gastrointestinal: Positive for nausea, vomiting, abdominal pain and constipation.  Genitourinary:       Urinary retention   Skin: Positive for rash.  Neurological: Positive for dizziness, tremors, weakness and numbness.       Tingling Spasms   Psychiatric/Behavioral: Positive for confusion and dysphoric mood. The patient is nervous/anxious.   All other systems reviewed and are negative.      Objective:   Physical Exam  Constitutional: She is oriented to person, place, and time. She appears well-developed and well-nourished.  HENT:  Head: Normocephalic and atraumatic.  Neck: Normal range of motion.  Neck supple.  Cardiovascular: Normal rate and regular rhythm.   Pulmonary/Chest: Effort normal and breath sounds normal.  Musculoskeletal:  Normal Muscle Bulk and Muscle Testing Reveals: Upper Extremities: Right: Full ROM Muscle strength 5/5 Left Decreased ROM 45 Degrees Left Spine of Scapula Hypersensitivity Lumbar Paraspinal Tenderness: L-3- L-5 Lower Extremities: Full ROM and Muscle Strength 5/5 Arises from chair with ease Narrow Based Gait   Neurological: She is alert and oriented to person, place, and time.  Skin: Skin is warm and dry.  Psychiatric: She has a normal mood and affect.  Nursing note and vitals reviewed.         Assessment & Plan:  1. Post-traumatic arthritis left shoulder s/p 2 shoulder surgeries. Now with adhesive capsulitis left shoulder. :  Refilled:oxyCODONE 5/325mg  one tablet every 8 hours as needed. #70  2. Cervicalgia, spondylosis: Continue Current Medication Regime.Lidoderm patches, Robaxin, Naprosyn, and Gabapentin 3. Right Iliotibial band syndrome: Continue exercise regime 4. Depression/insomnia/anxiety: Continue Lexapro and Xanax. Continue Ambien 5 mg HS #30.    20 minutes of face to face patient care time was spent during this isit. All questions were encouraged and answered.

## 2014-11-17 NOTE — Telephone Encounter (Signed)
Should be seen for that

## 2014-11-18 NOTE — Telephone Encounter (Signed)
Patient states that she has decided to take the medicine and will schedule a follow up.

## 2014-11-24 ENCOUNTER — Other Ambulatory Visit: Payer: Self-pay | Admitting: Registered Nurse

## 2014-12-02 DIAGNOSIS — H5203 Hypermetropia, bilateral: Secondary | ICD-10-CM | POA: Diagnosis not present

## 2014-12-02 DIAGNOSIS — H524 Presbyopia: Secondary | ICD-10-CM | POA: Diagnosis not present

## 2014-12-02 DIAGNOSIS — H52223 Regular astigmatism, bilateral: Secondary | ICD-10-CM | POA: Diagnosis not present

## 2014-12-15 ENCOUNTER — Other Ambulatory Visit: Payer: Self-pay | Admitting: Registered Nurse

## 2014-12-15 ENCOUNTER — Encounter: Payer: Self-pay | Admitting: Registered Nurse

## 2014-12-15 ENCOUNTER — Encounter: Payer: Medicare Other | Attending: Physical Medicine & Rehabilitation | Admitting: Registered Nurse

## 2014-12-15 DIAGNOSIS — M7631 Iliotibial band syndrome, right leg: Secondary | ICD-10-CM | POA: Diagnosis not present

## 2014-12-15 DIAGNOSIS — M791 Myalgia: Secondary | ICD-10-CM | POA: Insufficient documentation

## 2014-12-15 DIAGNOSIS — M47812 Spondylosis without myelopathy or radiculopathy, cervical region: Secondary | ICD-10-CM

## 2014-12-15 DIAGNOSIS — M7501 Adhesive capsulitis of right shoulder: Secondary | ICD-10-CM | POA: Diagnosis not present

## 2014-12-15 DIAGNOSIS — M7918 Myalgia, other site: Secondary | ICD-10-CM

## 2014-12-15 DIAGNOSIS — M25512 Pain in left shoulder: Secondary | ICD-10-CM | POA: Diagnosis not present

## 2014-12-15 DIAGNOSIS — Z5181 Encounter for therapeutic drug level monitoring: Secondary | ICD-10-CM | POA: Insufficient documentation

## 2014-12-15 DIAGNOSIS — Z79899 Other long term (current) drug therapy: Secondary | ICD-10-CM | POA: Diagnosis not present

## 2014-12-15 DIAGNOSIS — G894 Chronic pain syndrome: Secondary | ICD-10-CM | POA: Insufficient documentation

## 2014-12-15 DIAGNOSIS — M542 Cervicalgia: Secondary | ICD-10-CM | POA: Insufficient documentation

## 2014-12-15 DIAGNOSIS — F411 Generalized anxiety disorder: Secondary | ICD-10-CM | POA: Diagnosis not present

## 2014-12-15 DIAGNOSIS — M7502 Adhesive capsulitis of left shoulder: Secondary | ICD-10-CM | POA: Insufficient documentation

## 2014-12-15 DIAGNOSIS — G8929 Other chronic pain: Secondary | ICD-10-CM | POA: Diagnosis not present

## 2014-12-15 MED ORDER — OXYCODONE-ACETAMINOPHEN 5-325 MG PO TABS: 1.0000 | ORAL_TABLET | Freq: Three times a day (TID) | ORAL | Status: DC | PRN

## 2014-12-15 MED ORDER — GABAPENTIN 100 MG PO CAPS: 100.0000 mg | ORAL_CAPSULE | Freq: Two times a day (BID) | ORAL | Status: DC

## 2014-12-15 NOTE — Patient Instructions (Signed)
Take Gabapentin 100 mg capsule: Start off taking it daily for a week  If your not drowsy may increase to twice a day    Take Gabapentin  300 mg two capsules at bedtime.

## 2014-12-15 NOTE — Progress Notes (Signed)
Subjective:    Patient ID: Cassandra [name], female    DOB: 03-01-1956, 59 y.o.   MRN: [id number]  HPI: [name] is a 59 year old female who returns for follow up for chronic pain and medication refill. She says her pain is located in her neck, left arm and left leg. Occasionally left stabbing pain she's currently on gabapentin unable to take twice a day due to drowsiness. Prescribed Gabapentin 100 mg capsules. Instructed to call office in a few days she verbalizes understanding.  She rates her pain 7. Her current exercise regime is walking and gardening. Her PHQ-9 score is 21, she admits to being depressed. She denies suicidal thoughts or plan. Her mom passed away three months ago and her husband was having some cardiac issues. She states this has impacted her depression. In the past she seen a counselor she was encouraged to call for an appointment she verbalizes understanding.   Pain Inventory Average Pain 7 Pain Right Now 7 My pain is sharp, burning, dull, stabbing, tingling and aching  In the last 24 hours, has pain interfered with the following? General activity 6 Relation with others 9 Enjoyment of life 9 What TIME of day is your pain at its worst? morning and night Sleep (in general) Poor  Pain is worse with: bending, sitting, inactivity, standing and some activites Pain improves with: heat/ice, pacing activities, medication and hot baths Relief from Meds: 5  Mobility walk without assistance ability to climb steps?  yes do you drive?  yes  Function not employed: date last employed 2007 disabled: date disabled 2007 I need assistance with the following:  dressing, household duties and shopping  Neuro/Psych bladder control problems bowel control problems weakness numbness tremor tingling spasms dizziness confusion depression anxiety  Prior Studies Any changes since last visit?  no  Physicians involved in your care Any changes since last visit?   no   Family History  Problem Relation Age of Onset  . Coronary artery disease Mother   . Diabetes Mother     borderline  . Coronary artery disease Father   . Prostate cancer Father   . Liver cancer Maternal Grandmother   . Pancreatic cancer Father   . Colon cancer      Both sides  . Liver disease Maternal Grandmother   . Kidney disease Mother     stage 4   History   Social History  . Marital Status: Married    Spouse Name: N/A  . Number of Children: 1  . Years of Education: N/A   Occupational History  . Homemaker    Social History Main Topics  . Smoking status: Current Every Day Smoker -- 1.00 packs/day    Types: Cigarettes  . Smokeless tobacco: Never Used  . Alcohol Use: No  . Drug Use: No  . Sexual Activity: Not on file   Other Topics Concern  . None   Social History Narrative   Past Surgical History  Procedure Laterality Date  . Abdominal hysterectomy      age 47  . Bladder repair      sling  . Joint replacement      /knee  . Shoulder arthroscopy      multiple   Past Medical History  Diagnosis Date  . Nicotine addiction     2 packs per day   . Hyperlipidemia   . Chronic left shoulder pain     frozen   . Tubular adenoma of colon  01/31/2000    Dr. Richmond Campbell  . Maternal complication related to childbirth     Sepsis, staph  . Skin cancer     Rt. leg, squamous cell  . Anxiety   . Depression   . COPD (chronic obstructive pulmonary disease)     Dr. Redge Gainer   BP 126/60 mmHg  Pulse 59  Resp 14  SpO2 99%  Opioid Risk Score:   Fall Risk Score: Moderate Fall Risk (6-13 points) (previously educated and given handout)`1  Depression screen PHQ 2/9  Depression screen [facility] 2/9 12/15/2014 10/01/2014  Decreased Interest 2 2  Down, Depressed, Hopeless 2 2  PHQ - 2 Score 4 4  Altered sleeping 3 3  Tired, decreased energy 3 3  Change in appetite 3 3  Feeling bad or failure about yourself  2 1  Trouble concentrating 3 2  Moving slowly or  fidgety/restless 3 2  Suicidal thoughts 0 1  PHQ-9 Score 21 19    Review of Systems  Constitutional: Positive for diaphoresis, appetite change and unexpected weight change.  Respiratory: Positive for cough, shortness of breath and wheezing.        COPD  Gastrointestinal: Positive for nausea, vomiting, abdominal pain and constipation.  Genitourinary: Positive for difficulty urinating.  Skin: Positive for rash.  All other systems reviewed and are negative.      Objective:   Physical Exam  Constitutional: She is oriented to person, place, and time. She appears well-developed and well-nourished.  HENT:  Head: Normocephalic and atraumatic.  Neck: Normal range of motion. Neck supple.  Cardiovascular: Normal rate and regular rhythm.   Pulmonary/Chest: Effort normal and breath sounds normal.  Musculoskeletal:  Normal Muscle Bulk and Muscle Testing Reveals: Upper Extremities:Right:Full ROM and Muscle Strength 5/5 Left: Decreased ROM 45 Degrees and Muscle Strength 3/5 Thoracic Hypersensitivity: T-2- T-7 Lower Extremities: Full ROM and Muscle Strength 5/5 Arises from chair with ease Narrow Based Gait    Neurological: She is alert and oriented to person, place, and time.  Skin: Skin is warm and dry.  Psychiatric: She has a normal mood and affect.  Nursing note and vitals reviewed.         Assessment & Plan:  1. Post-traumatic arthritis left shoulder s/p 2 shoulder surgeries. Now with adhesive capsulitis left shoulder. :  Refilled:oxyCODONE 5/325mg  one tablet every 8 hours as needed. #70  2. Cervicalgia, spondylosis: Continue Current Medication Regime.Lidoderm patches, Robaxin, Naprosyn, and Gabapentin 3. Right Iliotibial band syndrome: Continue exercise regime 4. Depression/insomnia/anxiety: Continue Lexapro and Xanax. Continue Ambien 5 mg HS #30.    20 minutes of face to face patient care time was spent during this isit. All questions were encouraged and answered

## 2014-12-16 LAB — PMP ALCOHOL METABOLITE (ETG): Ethyl Glucuronide (EtG): NEGATIVE ng/mL

## 2014-12-17 ENCOUNTER — Other Ambulatory Visit: Payer: Self-pay | Admitting: Registered Nurse

## 2014-12-18 ENCOUNTER — Telehealth: Payer: Self-pay | Admitting: *Deleted

## 2014-12-18 NOTE — Telephone Encounter (Signed)
Discontinue the medication. She should follow up here in the next several days to decide on a substitute. On the other hand we will need to determine the cause of the burning if discontinuation of the medication does not cause the burning to resolve. Thanks, WS.

## 2014-12-18 NOTE — Telephone Encounter (Signed)
Pt aware of Dr. Livia Snellen recommendation

## 2014-12-18 NOTE — Telephone Encounter (Signed)
Patient states that since she started the toprolol she has had severe diarrhea and she has burning sensation from the top of her head to the bottom of your feet. Please advise.

## 2014-12-20 LAB — PRESCRIPTION MONITORING PROFILE (SOLSTAS)
Amphetamine/Meth: NEGATIVE ng/mL
BARBITURATE SCREEN, URINE: NEGATIVE ng/mL
BENZODIAZEPINE SCREEN, URINE: NEGATIVE ng/mL
BUPRENORPHINE, URINE: NEGATIVE ng/mL
CARISOPRODOL, URINE: NEGATIVE ng/mL
CREATININE, URINE: 11.5 mg/dL — AB (ref >20.0)
Cannabinoid Scrn, Ur: NEGATIVE ng/mL
Cocaine Metabolites: NEGATIVE ng/mL
Fentanyl, Ur: NEGATIVE ng/mL
MDMA URINE: NEGATIVE ng/mL
METHADONE SCREEN, URINE: NEGATIVE ng/mL
Meperidine, Ur: NEGATIVE ng/mL
Nitrites, Initial: NEGATIVE ug/mL
Opiate Screen, Urine: NEGATIVE ng/mL
PH URINE, INITIAL: 5.3 pH (ref 4.5–8.9)
Propoxyphene: NEGATIVE ng/mL
Tapentadol, urine: NEGATIVE ng/mL
Tramadol Scrn, Ur: NEGATIVE ng/mL
Zolpidem, Urine: NEGATIVE ng/mL

## 2014-12-20 LAB — OXYCODONE, URINE (LC/MS-MS)
Noroxycodone, Ur: 149 ng/mL (ref <50)
Oxycodone, ur: 57 ng/mL (ref <50)
Oxymorphone: 99 ng/mL (ref <50)

## 2014-12-25 ENCOUNTER — Telehealth: Payer: Self-pay | Admitting: *Deleted

## 2014-12-25 NOTE — Telephone Encounter (Signed)
Cassandra [name] called back to let [name] know that the new dose of medication (gabapentin) is working very well.

## 2014-12-25 NOTE — Progress Notes (Signed)
Urine drug screen for this encounter is consistent for prescribed medication 

## 2015-01-12 ENCOUNTER — Encounter: Payer: Medicare Other | Attending: Physical Medicine & Rehabilitation | Admitting: Physical Medicine & Rehabilitation

## 2015-01-12 ENCOUNTER — Encounter: Payer: Self-pay | Admitting: Physical Medicine & Rehabilitation

## 2015-01-12 VITALS — BP 129/83 | HR 74 | Resp 14

## 2015-01-12 DIAGNOSIS — Z79899 Other long term (current) drug therapy: Secondary | ICD-10-CM | POA: Diagnosis not present

## 2015-01-12 DIAGNOSIS — M7918 Myalgia, other site: Secondary | ICD-10-CM

## 2015-01-12 DIAGNOSIS — G894 Chronic pain syndrome: Secondary | ICD-10-CM | POA: Diagnosis not present

## 2015-01-12 DIAGNOSIS — M542 Cervicalgia: Secondary | ICD-10-CM | POA: Diagnosis not present

## 2015-01-12 DIAGNOSIS — M47812 Spondylosis without myelopathy or radiculopathy, cervical region: Secondary | ICD-10-CM

## 2015-01-12 DIAGNOSIS — F411 Generalized anxiety disorder: Secondary | ICD-10-CM | POA: Diagnosis not present

## 2015-01-12 DIAGNOSIS — M7631 Iliotibial band syndrome, right leg: Secondary | ICD-10-CM | POA: Insufficient documentation

## 2015-01-12 DIAGNOSIS — Z5181 Encounter for therapeutic drug level monitoring: Secondary | ICD-10-CM | POA: Insufficient documentation

## 2015-01-12 DIAGNOSIS — M19212 Secondary osteoarthritis, left shoulder: Secondary | ICD-10-CM

## 2015-01-12 DIAGNOSIS — G8929 Other chronic pain: Secondary | ICD-10-CM | POA: Diagnosis not present

## 2015-01-12 DIAGNOSIS — M7502 Adhesive capsulitis of left shoulder: Secondary | ICD-10-CM | POA: Diagnosis not present

## 2015-01-12 DIAGNOSIS — M7501 Adhesive capsulitis of right shoulder: Secondary | ICD-10-CM | POA: Insufficient documentation

## 2015-01-12 DIAGNOSIS — M791 Myalgia: Secondary | ICD-10-CM | POA: Insufficient documentation

## 2015-01-12 DIAGNOSIS — M25512 Pain in left shoulder: Secondary | ICD-10-CM | POA: Insufficient documentation

## 2015-01-12 MED ORDER — ESCITALOPRAM OXALATE 10 MG PO TABS: 20.0000 mg | ORAL_TABLET | Freq: Every day | ORAL | Status: DC

## 2015-01-12 MED ORDER — OXYCODONE-ACETAMINOPHEN 5-325 MG PO TABS: 1.0000 | ORAL_TABLET | Freq: Three times a day (TID) | ORAL | Status: DC | PRN

## 2015-01-12 MED ORDER — ALPRAZOLAM 0.5 MG PO TABS: ORAL_TABLET | ORAL | Status: DC

## 2015-01-12 MED ORDER — ZOLPIDEM TARTRATE 5 MG PO TABS: 5.0000 mg | ORAL_TABLET | Freq: Every evening | ORAL | Status: DC | PRN

## 2015-01-12 NOTE — Progress Notes (Signed)
Subjective:    Patient ID: Cassandra [name], female    DOB: July 14, 1956, 59 y.o.   MRN: [id number]  HPI   Mrs. Nigh is here in follow of her chronic left shoulder pain. She tells me that her pain levels have been fairly stable. Her neck responds to traction.   Her cervical MRI from October revealed: Disc degeneration and spondylosis at C5-6 and C6-7 causing mild spinal stenosis and mild foraminal stenosis bilaterally. No focal disc protrusion  She had strange stroke/seizure like episode x 2 after beginning metoprolol.  It has been stopped and no further episodes.  Depression and anxiety continue to be issues. She separated from her husband, lost her mother, and continues to struggle with the changes brought on by her pain/left shoulder.   Pain Inventory Average Pain 7 Pain Right Now 7 My pain is intermittent  In the last 24 hours, has pain interfered with the following? General activity 7 Relation with others 9 Enjoyment of life 9 What TIME of day is your pain at its worst? morning and night Sleep (in general) Poor  Pain is worse with: bending, sitting, inactivity, standing and unsure Pain improves with: heat/ice, pacing activities and medication Relief from Meds: 5  Mobility walk without assistance ability to climb steps?  yes do you drive?  yes  Function not employed: date last employed 2007 disabled: date disabled 2007 I need assistance with the following:  dressing, household duties and shopping  Neuro/Psych bladder control problems bowel control problems weakness numbness tremor tingling spasms dizziness confusion depression anxiety  Prior Studies Any changes since last visit?  no  Physicians involved in your care Any changes since last visit?  no   Family History  Problem Relation Age of Onset  . Coronary artery disease Mother   . Diabetes Mother     borderline  . Coronary artery disease Father   . Prostate cancer Father   . Liver cancer  Maternal Grandmother   . Pancreatic cancer Father   . Colon cancer      Both sides  . Liver disease Maternal Grandmother   . Kidney disease Mother     stage 4   History   Social History  . Marital Status: Married    Spouse Name: N/A  . Number of Children: 1  . Years of Education: N/A   Occupational History  . Homemaker    Social History Main Topics  . Smoking status: Current Every Day Smoker -- 1.00 packs/day    Types: Cigarettes  . Smokeless tobacco: Never Used  . Alcohol Use: No  . Drug Use: No  . Sexual Activity: Not on file   Other Topics Concern  . None   Social History Narrative   Past Surgical History  Procedure Laterality Date  . Abdominal hysterectomy      age 16  . Bladder repair      sling  . Joint replacement      /knee  . Shoulder arthroscopy      multiple   Past Medical History  Diagnosis Date  . Nicotine addiction     2 packs per day   . Hyperlipidemia   . Chronic left shoulder pain     frozen   . Tubular adenoma of colon 01/31/2000    Dr. Richmond Campbell  . Maternal complication related to childbirth     Sepsis, staph  . Skin cancer     Rt. leg, squamous cell  . Anxiety   .  Depression   . COPD (chronic obstructive pulmonary disease)     Dr. Redge Gainer   BP 129/83 mmHg  Pulse 74  Resp 14  SpO2 97%  Opioid Risk Score:   Fall Risk Score: Moderate Fall Risk (6-13 points) (previously educated and given handout)`1  Depression screen PHQ 2/9  Depression screen [facility] 2/9 12/15/2014 10/01/2014  Decreased Interest 2 2  Down, Depressed, Hopeless 2 2  PHQ - 2 Score 4 4  Altered sleeping 3 3  Tired, decreased energy 3 3  Change in appetite 3 3  Feeling bad or failure about yourself  2 1  Trouble concentrating 3 2  Moving slowly or fidgety/restless 3 2  Suicidal thoughts 0 1  PHQ-9 Score 21 19    Review of Systems  Constitutional: Positive for diaphoresis, appetite change and unexpected weight change.  Respiratory: Positive for  cough.        Copd  Gastrointestinal: Positive for nausea, vomiting, abdominal pain and constipation.  Genitourinary: Positive for difficulty urinating.  Musculoskeletal:       Spasms  Neurological: Positive for dizziness, tremors, weakness and numbness.       Tingling  Psychiatric/Behavioral: Positive for confusion and dysphoric mood. The patient is nervous/anxious.   All other systems reviewed and are negative.      Objective:   Physical Exam  Constitutional: Positive for unexpected weight change.  Respiratory: Positive for cough and wheezing.  Gastrointestinal: Positive for nausea and constipation.  Genitourinary: Positive for dysuria and decreased urine volume.  Musculoskeletal: Positive for neck pain.  Neurological: Positive for dizziness, tremors, weakness and numbness.  Tingling, spasms  Psychiatric/Behavioral: Positive for suicidal ideas, confusion and dysphoric mood. The patient is nervous/anxious.  All other systems reviewed and are negative.   Objective:   Physical Exam  General: Alert and oriented x 3, No apparent distress. Appears to be in good physical condition  HEENT: Head is normocephalic, atraumatic, PERRLA, EOMI, sclera anicteric, oral mucosa pink and moist, dentition intact, ext ear canals clear,  Neck: Supple without JVD or lymphadenopathy  Heart: Reg rate and rhythm. No murmurs rubs or gallops  Chest: CTA bilaterally without wheezes, rales, or rhonchi; no distress  Abdomen: Soft, non-tender, non-distended, bowel sounds positive.  Extremities: No clubbing, cyanosis, or edema. Pulses are 2+  Skin: Clean and intact without signs of breakdown. Area of shoulder surgery noted.  Neuro: Pt is cognitively appropriate with normal insight, memory, and awareness. Cranial nerves 2-12 are intact. Sensory exam is normal. Reflexes are 2+ in all 4's. Fine motor coordination is intact. No tremors. Motor function is grossly 5/5.  Musculoskeletal: Left shoulder substantially  limited with Abduction 40deg, flexion 45deg, ext 15 deg. She had pain along labrum,subacromial area and surrounding areas. No swelling or skin discoloration. Area is NOT alodynic. She has spasm along the left trap and SCM. Shoulder girdle positioning and head posture is fair.Marland Kitchen Spurling's test negative again.  Psych: Pt's affect is appropriate. Pt is cooperative a little anxious perhaps. alert    Assessment & Plan:   1. Post-traumatic arthritis left shoulder s/p 2 shoulder surgeries. Now with adhesive capsulitis left shoulder.  2. Cervicalgia, spondylosis.   3. Right ITB syndrome  4. Depression/insomnia/anxiety    Plan:  1. Increase lexapro for depression/anxiety. Discussed working on coping skills/increased leisure activity.  2. Robaxin for muscle spasms  3. Gabapentin  4. Naproxen 500mg  bid  5. ITB stretching and exercises  6. Refilled percocet for breakthrough pain.  7. Continue traction/posture exercises for  neck  8. I or NP will see her back in about a month. 30 minutes of face to face patient care time were spent during this visit. All questions were encouraged and answered.  Marland Kitchen

## 2015-01-12 NOTE — Patient Instructions (Signed)
WORK ON INCREASING YOUR EXERCISES AND LEISURE ACTIVITIES.

## 2015-01-13 ENCOUNTER — Encounter: Payer: Self-pay | Admitting: *Deleted

## 2015-01-13 ENCOUNTER — Ambulatory Visit (INDEPENDENT_AMBULATORY_CARE_PROVIDER_SITE_OTHER): Payer: Medicare Other | Admitting: *Deleted

## 2015-01-13 VITALS — BP 141/76 | Ht 66.0 in | Wt 147.0 lb

## 2015-01-13 DIAGNOSIS — Z0000 Encounter for general adult medical examination without abnormal findings: Secondary | ICD-10-CM

## 2015-01-13 NOTE — Patient Instructions (Signed)
You have been scheduled for a mammogram at the mobile unit that comes to [place] on May 25th at 11:45 am.  If you have any questions, please contact the breast center at [phone]  We have scheduled you for a follow up with Dr. Livia Snellen May 26th at 1:55pm   Preventive Care for Adults A healthy lifestyle and preventive care can promote health and wellness. Preventive health guidelines for women include the following key practices.  A routine yearly physical is a good way to check with your health care provider about your health and preventive screening. It is a chance to share any concerns and updates on your health and to receive a thorough exam.  Visit your dentist for a routine exam and preventive care every 6 months. Brush your teeth twice a day and floss once a day. Good oral hygiene prevents tooth decay and gum disease.  The frequency of eye exams is based on your age, health, family medical history, use of contact lenses, and other factors. Follow your health care provider's recommendations for frequency of eye exams.  Eat a healthy diet. Foods like vegetables, fruits, whole grains, low-fat dairy products, and lean protein foods contain the nutrients you need without too many calories. Decrease your intake of foods high in solid fats, added sugars, and salt. Eat the right amount of calories for you.Get information about a proper diet from your health care provider, if necessary.  Regular physical exercise is one of the most important things you can do for your health. Most adults should get at least 150 minutes of moderate-intensity exercise (any activity that increases your heart rate and causes you to sweat) each week. In addition, most adults need muscle-strengthening exercises on 2 or more days a week.  Maintain a healthy weight. The body mass index (BMI) is a screening tool to identify possible weight problems. It provides an estimate of body fat based on  height and weight. Your health care provider can find your BMI and can help you achieve or maintain a healthy weight.For adults 20 years and older:  A BMI below 18.5 is considered underweight.  A BMI of 18.5 to 24.9 is normal.  A BMI of 25 to 29.9 is considered overweight.  A BMI of 30 and above is considered obese.  Maintain normal blood lipids and cholesterol levels by exercising and minimizing your intake of saturated fat. Eat a balanced diet with plenty of fruit and vegetables. Blood tests for lipids and cholesterol should begin at age 61 and be repeated every 5 years. If your lipid or cholesterol levels are high, you are over 50, or you are at high risk for heart disease, you may need your cholesterol levels checked more frequently.Ongoing high lipid and cholesterol levels should be treated with medicines if diet and exercise are not working.  If you smoke, find out from your health care provider how to quit. If you do not use tobacco, do not start.  Lung cancer screening is recommended for adults aged 57-80 years who are at high risk for developing lung cancer because of a history of smoking. A yearly low-dose CT scan of the lungs is recommended for people who have at least a 30-pack-year history of smoking and are a current smoker or have quit within the past 15 years. A pack year of smoking is smoking an average of 1 pack of cigarettes a day for 1 year (for example: 1 pack a day for 30 years or 2  packs a day for 15 years). Yearly screening should continue until the smoker has stopped smoking for at least 15 years. Yearly screening should be stopped for people who develop a health problem that would prevent them from having lung cancer treatment.  If you are pregnant, do not drink alcohol. If you are breastfeeding, be very cautious about drinking alcohol. If you are not pregnant and choose to drink alcohol, do not have more than 1 drink per day. One drink is considered to be 12 ounces (355  mL) of beer, 5 ounces (148 mL) of wine, or 1.5 ounces (44 mL) of liquor.  Avoid use of street drugs. Do not share needles with anyone. Ask for help if you need support or instructions about stopping the use of drugs.  High blood pressure causes heart disease and increases the risk of stroke. Your blood pressure should be checked at least every 1 to 2 years. Ongoing high blood pressure should be treated with medicines if weight loss and exercise do not work.  If you are 9-84 years old, ask your health care provider if you should take aspirin to prevent strokes.  Diabetes screening involves taking a blood sample to check your fasting blood sugar level. This should be done once every 3 years, after age 24, if you are within normal weight and without risk factors for diabetes. Testing should be considered at a younger age or be carried out more frequently if you are overweight and have at least 1 risk factor for diabetes.  Breast cancer screening is essential preventive care for women. You should practice "breast self-awareness." This means understanding the normal appearance and feel of your breasts and may include breast self-examination. Any changes detected, no matter how small, should be reported to a health care provider. Women in their 92s and 30s should have a clinical breast exam (CBE) by a health care provider as part of a regular health exam every 1 to 3 years. After age 45, women should have a CBE every year. Starting at age 56, women should consider having a mammogram (breast X-ray test) every year. Women who have a family history of breast cancer should talk to their health care provider about genetic screening. Women at a high risk of breast cancer should talk to their health care providers about having an MRI and a mammogram every year.  Breast cancer gene (BRCA)-related cancer risk assessment is recommended for women who have family members with BRCA-related cancers. BRCA-related cancers  include breast, ovarian, tubal, and peritoneal cancers. Having family members with these cancers may be associated with an increased risk for harmful changes (mutations) in the breast cancer genes BRCA1 and BRCA2. Results of the assessment will determine the need for genetic counseling and BRCA1 and BRCA2 testing.  Routine pelvic exams to screen for cancer are no longer recommended for nonpregnant women who are considered low risk for cancer of the pelvic organs (ovaries, uterus, and vagina) and who do not have symptoms. Ask your health care provider if a screening pelvic exam is right for you.  If you have had past treatment for cervical cancer or a condition that could lead to cancer, you need Pap tests and screening for cancer for at least 20 years after your treatment. If Pap tests have been discontinued, your risk factors (such as having a new sexual partner) need to be reassessed to determine if screening should be resumed. Some women have medical problems that increase the chance of getting cervical cancer. In  these cases, your health care provider may recommend more frequent screening and Pap tests.  The HPV test is an additional test that may be used for cervical cancer screening. The HPV test looks for the virus that can cause the cell changes on the cervix. The cells collected during the Pap test can be tested for HPV. The HPV test could be used to screen women aged 58 years and older, and should be used in women of any age who have unclear Pap test results. After the age of 11, women should have HPV testing at the same frequency as a Pap test.  Colorectal cancer can be detected and often prevented. Most routine colorectal cancer screening begins at the age of 75 years and continues through age 21 years. However, your health care provider may recommend screening at an earlier age if you have risk factors for colon cancer. On a yearly basis, your health care provider may provide home test kits to  check for hidden blood in the stool. Use of a small camera at the end of a tube, to directly examine the colon (sigmoidoscopy or colonoscopy), can detect the earliest forms of colorectal cancer. Talk to your health care provider about this at age 15, when routine screening begins. Direct exam of the colon should be repeated every 5-10 years through age 43 years, unless early forms of pre-cancerous polyps or small growths are found.  People who are at an increased risk for hepatitis B should be screened for this virus. You are considered at high risk for hepatitis B if:  You were born in a country where hepatitis B occurs often. Talk with your health care provider about which countries are considered high risk.  Your parents were born in a high-risk country and you have not received a shot to protect against hepatitis B (hepatitis B vaccine).  You have HIV or AIDS.  You use needles to inject street drugs.  You live with, or have sex with, someone who has hepatitis B.  You get hemodialysis treatment.  You take certain medicines for conditions like cancer, organ transplantation, and autoimmune conditions.  Hepatitis C blood testing is recommended for all people born from [birth year] through 1965 and any individual with known risks for hepatitis C.  Practice safe sex. Use condoms and avoid high-risk sexual practices to reduce the spread of sexually transmitted infections (STIs). STIs include gonorrhea, chlamydia, syphilis, trichomonas, herpes, HPV, and human immunodeficiency virus (HIV). Herpes, HIV, and HPV are viral illnesses that have no cure. They can result in disability, cancer, and death.  You should be screened for sexually transmitted illnesses (STIs) including gonorrhea and chlamydia if:  You are sexually active and are younger than 24 years.  You are older than 24 years and your health care provider tells you that you are at risk for this type of infection.  Your sexual activity has  changed since you were last screened and you are at an increased risk for chlamydia or gonorrhea. Ask your health care provider if you are at risk.  If you are at risk of being infected with HIV, it is recommended that you take a prescription medicine daily to prevent HIV infection. This is called preexposure prophylaxis (PrEP). You are considered at risk if:  You are a heterosexual woman, are sexually active, and are at increased risk for HIV infection.  You take drugs by injection.  You are sexually active with a partner who has HIV.  Talk with your health care  provider about whether you are at high risk of being infected with HIV. If you choose to begin PrEP, you should first be tested for HIV. You should then be tested every 3 months for as long as you are taking PrEP.  Osteoporosis is a disease in which the bones lose minerals and strength with aging. This can result in serious bone fractures or breaks. The risk of osteoporosis can be identified using a bone density scan. Women ages 2 years and over and women at risk for fractures or osteoporosis should discuss screening with their health care providers. Ask your health care provider whether you should take a calcium supplement or vitamin D to reduce the rate of osteoporosis.  Menopause can be associated with physical symptoms and risks. Hormone replacement therapy is available to decrease symptoms and risks. You should talk to your health care provider about whether hormone replacement therapy is right for you.  Use sunscreen. Apply sunscreen liberally and repeatedly throughout the day. You should seek shade when your shadow is shorter than you. Protect yourself by wearing long sleeves, pants, a wide-brimmed hat, and sunglasses year round, whenever you are outdoors.  Once a month, do a whole body skin exam, using a mirror to look at the skin on your back. Tell your health care provider of new moles, moles that have irregular borders, moles  that are larger than a pencil eraser, or moles that have changed in shape or color.  Stay current with required vaccines (immunizations).  Influenza vaccine. All adults should be immunized every year.  Tetanus, diphtheria, and acellular pertussis (Td, Tdap) vaccine. Pregnant women should receive 1 dose of Tdap vaccine during each pregnancy. The dose should be obtained regardless of the length of time since the last dose. Immunization is preferred during the 27th-36th week of gestation. An adult who has not previously received Tdap or who does not know her vaccine status should receive 1 dose of Tdap. This initial dose should be followed by tetanus and diphtheria toxoids (Td) booster doses every 10 years. Adults with an unknown or incomplete history of completing a 3-dose immunization series with Td-containing vaccines should begin or complete a primary immunization series including a Tdap dose. Adults should receive a Td booster every 10 years.  Varicella vaccine. An adult without evidence of immunity to varicella should receive 2 doses or a second dose if she has previously received 1 dose. Pregnant females who do not have evidence of immunity should receive the first dose after pregnancy. This first dose should be obtained before leaving the health care facility. The second dose should be obtained 4-8 weeks after the first dose.  Human papillomavirus (HPV) vaccine. Females aged 13-26 years who have not received the vaccine previously should obtain the 3-dose series. The vaccine is not recommended for use in pregnant females. However, pregnancy testing is not needed before receiving a dose. If a female is found to be pregnant after receiving a dose, no treatment is needed. In that case, the remaining doses should be delayed until after the pregnancy. Immunization is recommended for any person with an immunocompromised condition through the age of 36 years if she did not get any or all doses earlier.  During the 3-dose series, the second dose should be obtained 4-8 weeks after the first dose. The third dose should be obtained 24 weeks after the first dose and 16 weeks after the second dose.  Zoster vaccine. One dose is recommended for adults aged 3 years or older  unless certain conditions are present.  Measles, mumps, and rubella (MMR) vaccine. Adults born before [birth year] generally are considered immune to measles and mumps. Adults born in [birth year] or later should have 1 or more doses of MMR vaccine unless there is a contraindication to the vaccine or there is laboratory evidence of immunity to each of the three diseases. A routine second dose of MMR vaccine should be obtained at least 28 days after the first dose for students attending postsecondary schools, health care workers, or international travelers. People who received inactivated measles vaccine or an unknown type of measles vaccine during 1963-1967 should receive 2 doses of MMR vaccine. People who received inactivated mumps vaccine or an unknown type of mumps vaccine before 1979 and are at high risk for mumps infection should consider immunization with 2 doses of MMR vaccine. For females of childbearing age, rubella immunity should be determined. If there is no evidence of immunity, females who are not pregnant should be vaccinated. If there is no evidence of immunity, females who are pregnant should delay immunization until after pregnancy. Unvaccinated health care workers born before [birth year] who lack laboratory evidence of measles, mumps, or rubella immunity or laboratory confirmation of disease should consider measles and mumps immunization with 2 doses of MMR vaccine or rubella immunization with 1 dose of MMR vaccine.  Pneumococcal 13-valent conjugate (PCV13) vaccine. When indicated, a person who is uncertain of her immunization history and has no record of immunization should receive the PCV13 vaccine. An adult aged 11 years or older who has certain  medical conditions and has not been previously immunized should receive 1 dose of PCV13 vaccine. This PCV13 should be followed with a dose of pneumococcal polysaccharide (PPSV23) vaccine. The PPSV23 vaccine dose should be obtained at least 8 weeks after the dose of PCV13 vaccine. An adult aged 74 years or older who has certain medical conditions and previously received 1 or more doses of PPSV23 vaccine should receive 1 dose of PCV13. The PCV13 vaccine dose should be obtained 1 or more years after the last PPSV23 vaccine dose.  Pneumococcal polysaccharide (PPSV23) vaccine. When PCV13 is also indicated, PCV13 should be obtained first. All adults aged 66 years and older should be immunized. An adult younger than age 64 years who has certain medical conditions should be immunized. Any person who resides in a nursing home or long-term care facility should be immunized. An adult smoker should be immunized. People with an immunocompromised condition and certain other conditions should receive both PCV13 and PPSV23 vaccines. People with human immunodeficiency virus (HIV) infection should be immunized as soon as possible after diagnosis. Immunization during chemotherapy or radiation therapy should be avoided. Routine use of PPSV23 vaccine is not recommended for American Indians, [place] Natives, or people younger than 65 years unless there are medical conditions that require PPSV23 vaccine. When indicated, people who have unknown immunization and have no record of immunization should receive PPSV23 vaccine. One-time revaccination 5 years after the first dose of PPSV23 is recommended for people aged 19-64 years who have chronic kidney failure, nephrotic syndrome, asplenia, or immunocompromised conditions. People who received 1-2 doses of PPSV23 before age 84 years should receive another dose of PPSV23 vaccine at age 87 years or later if at least 5 years have passed since the previous dose. Doses of PPSV23 are not needed for  people immunized with PPSV23 at or after age 27 years.  Meningococcal vaccine. Adults with asplenia or persistent complement component deficiencies should receive 2 doses  of quadrivalent meningococcal conjugate (MenACWY-D) vaccine. The doses should be obtained at least 2 months apart. Microbiologists working with certain meningococcal bacteria, [employer] recruits, people at risk during an outbreak, and people who travel to or live in countries with a high rate of meningitis should be immunized. A first-year college student up through age 53 years who is living in a residence hall should receive a dose if she did not receive a dose on or after her 16th birthday. Adults who have certain high-risk conditions should receive one or more doses of vaccine.  Hepatitis A vaccine. Adults who wish to be protected from this disease, have certain high-risk conditions, work with hepatitis A-infected animals, work in hepatitis A research labs, or travel to or work in countries with a high rate of hepatitis A should be immunized. Adults who were previously unvaccinated and who anticipate close contact with an international adoptee during the first 60 days after arrival in the [place] from a country with a high rate of hepatitis A should be immunized.  Hepatitis B vaccine. Adults who wish to be protected from this disease, have certain high-risk conditions, may be exposed to blood or other infectious body fluids, are household contacts or sex partners of hepatitis B positive people, are clients or workers in certain care facilities, or travel to or work in countries with a high rate of hepatitis B should be immunized.  Haemophilus influenzae type b (Hib) vaccine. A previously unvaccinated person with asplenia or sickle cell disease or having a scheduled splenectomy should receive 1 dose of Hib vaccine. Regardless of previous immunization, a recipient of a hematopoietic stem cell transplant should receive a 3-dose series  6-12 months after her successful transplant. Hib vaccine is not recommended for adults with HIV infection. Preventive Services / Frequency Ages 47 to 41 years  Blood pressure check.** / Every 1 to 2 years.  Lipid and cholesterol check.** / Every 5 years beginning at age 71.  Clinical breast exam.** / Every 3 years for women in their 4s and 74s.  BRCA-related cancer risk assessment.** / For women who have family members with a BRCA-related cancer (breast, ovarian, tubal, or peritoneal cancers).  Pap test.** / Every 2 years from ages 26 through 88. Every 3 years starting at age 39 through age 15 or 6 with a history of 3 consecutive normal Pap tests.  HPV screening.** / Every 3 years from ages 55 through ages 77 to 47 with a history of 3 consecutive normal Pap tests.  Hepatitis C blood test.** / For any individual with known risks for hepatitis C.  Skin self-exam. / Monthly.  Influenza vaccine. / Every year.  Tetanus, diphtheria, and acellular pertussis (Tdap, Td) vaccine.** / Consult your health care provider. Pregnant women should receive 1 dose of Tdap vaccine during each pregnancy. 1 dose of Td every 10 years.  Varicella vaccine.** / Consult your health care provider. Pregnant females who do not have evidence of immunity should receive the first dose after pregnancy.  HPV vaccine. / 3 doses over 6 months, if 61 and younger. The vaccine is not recommended for use in pregnant females. However, pregnancy testing is not needed before receiving a dose.  Measles, mumps, rubella (MMR) vaccine.** / You need at least 1 dose of MMR if you were born in 1957 or later. You may also need a 2nd dose. For females of childbearing age, rubella immunity should be determined. If there is no evidence of immunity, females who are not pregnant  should be vaccinated. If there is no evidence of immunity, females who are pregnant should delay immunization until after pregnancy.  Pneumococcal 13-valent  conjugate (PCV13) vaccine.** / Consult your health care provider.  Pneumococcal polysaccharide (PPSV23) vaccine.** / 1 to 2 doses if you smoke cigarettes or if you have certain conditions.  Meningococcal vaccine.** / 1 dose if you are age 62 to 33 years and a Market researcher living in a residence hall, or have one of several medical conditions, you need to get vaccinated against meningococcal disease. You may also need additional booster doses.  Hepatitis A vaccine.** / Consult your health care provider.  Hepatitis B vaccine.** / Consult your health care provider.  Haemophilus influenzae type b (Hib) vaccine.** / Consult your health care provider. Ages 36 to 75 years  Blood pressure check.** / Every 1 to 2 years.  Lipid and cholesterol check.** / Every 5 years beginning at age 57 years.  Lung cancer screening. / Every year if you are aged 45-80 years and have a 30-pack-year history of smoking and currently smoke or have quit within the past 15 years. Yearly screening is stopped once you have quit smoking for at least 15 years or develop a health problem that would prevent you from having lung cancer treatment.  Clinical breast exam.** / Every year after age 77 years.  BRCA-related cancer risk assessment.** / For women who have family members with a BRCA-related cancer (breast, ovarian, tubal, or peritoneal cancers).  Mammogram.** / Every year beginning at age 12 years and continuing for as long as you are in good health. Consult with your health care provider.  Pap test.** / Every 3 years starting at age 42 years through age 24 or 74 years with a history of 3 consecutive normal Pap tests.  HPV screening.** / Every 3 years from ages 71 years through ages 43 to 49 years with a history of 3 consecutive normal Pap tests.  Fecal occult blood test (FOBT) of stool. / Every year beginning at age 15 years and continuing until age 74 years. You may not need to do this test if you get a  colonoscopy every 10 years.  Flexible sigmoidoscopy or colonoscopy.** / Every 5 years for a flexible sigmoidoscopy or every 10 years for a colonoscopy beginning at age 51 years and continuing until age 35 years.  Hepatitis C blood test.** / For all people born from [birth year] through 1965 and any individual with known risks for hepatitis C.  Skin self-exam. / Monthly.  Influenza vaccine. / Every year.  Tetanus, diphtheria, and acellular pertussis (Tdap/Td) vaccine.** / Consult your health care provider. Pregnant women should receive 1 dose of Tdap vaccine during each pregnancy. 1 dose of Td every 10 years.  Varicella vaccine.** / Consult your health care provider. Pregnant females who do not have evidence of immunity should receive the first dose after pregnancy.  Zoster vaccine.** / 1 dose for adults aged 42 years or older.  Measles, mumps, rubella (MMR) vaccine.** / You need at least 1 dose of MMR if you were born in 1957 or later. You may also need a 2nd dose. For females of childbearing age, rubella immunity should be determined. If there is no evidence of immunity, females who are not pregnant should be vaccinated. If there is no evidence of immunity, females who are pregnant should delay immunization until after pregnancy.  Pneumococcal 13-valent conjugate (PCV13) vaccine.** / Consult your health care provider.  Pneumococcal polysaccharide (PPSV23) vaccine.** / 1 to  2 doses if you smoke cigarettes or if you have certain conditions.  Meningococcal vaccine.** / Consult your health care provider.  Hepatitis A vaccine.** / Consult your health care provider.  Hepatitis B vaccine.** / Consult your health care provider.  Haemophilus influenzae type b (Hib) vaccine.** / Consult your health care provider. Ages 62 years and over  Blood pressure check.** / Every 1 to 2 years.  Lipid and cholesterol check.** / Every 5 years beginning at age 72 years.  Lung cancer screening. / Every year if you  are aged 88-80 years and have a 30-pack-year history of smoking and currently smoke or have quit within the past 15 years. Yearly screening is stopped once you have quit smoking for at least 15 years or develop a health problem that would prevent you from having lung cancer treatment.  Clinical breast exam.** / Every year after age 58 years.  BRCA-related cancer risk assessment.** / For women who have family members with a BRCA-related cancer (breast, ovarian, tubal, or peritoneal cancers).  Mammogram.** / Every year beginning at age 17 years and continuing for as long as you are in good health. Consult with your health care provider.  Pap test.** / Every 3 years starting at age 55 years through age 46 or 53 years with 3 consecutive normal Pap tests. Testing can be stopped between 65 and 70 years with 3 consecutive normal Pap tests and no abnormal Pap or HPV tests in the past 10 years.  HPV screening.** / Every 3 years from ages 39 years through ages 64 or 23 years with a history of 3 consecutive normal Pap tests. Testing can be stopped between 65 and 70 years with 3 consecutive normal Pap tests and no abnormal Pap or HPV tests in the past 10 years.  Fecal occult blood test (FOBT) of stool. / Every year beginning at age 68 years and continuing until age 9 years. You may not need to do this test if you get a colonoscopy every 10 years.  Flexible sigmoidoscopy or colonoscopy.** / Every 5 years for a flexible sigmoidoscopy or every 10 years for a colonoscopy beginning at age 10 years and continuing until age 103 years.  Hepatitis C blood test.** / For all people born from [birth year] through 1965 and any individual with known risks for hepatitis C.  Osteoporosis screening.** / A one-time screening for women ages 31 years and over and women at risk for fractures or osteoporosis.  Skin self-exam. / Monthly.  Influenza vaccine. / Every year.  Tetanus, diphtheria, and acellular pertussis (Tdap/Td)  vaccine.** / 1 dose of Td every 10 years.  Varicella vaccine.** / Consult your health care provider.  Zoster vaccine.** / 1 dose for adults aged 28 years or older.  Pneumococcal 13-valent conjugate (PCV13) vaccine.** / Consult your health care provider.  Pneumococcal polysaccharide (PPSV23) vaccine.** / 1 dose for all adults aged 63 years and older.  Meningococcal vaccine.** / Consult your health care provider.  Hepatitis A vaccine.** / Consult your health care provider.  Hepatitis B vaccine.** / Consult your health care provider.  Haemophilus influenzae type b (Hib) vaccine.** / Consult your health care provider. ** Family history and personal history of risk and conditions may change your health care provider's recommendations. Document Released: 10/25/2001 Document Revised: 01/13/2014 Document Reviewed: 01/24/2011 [facility] Patient Information 2015 [place], [place]. This information is not intended to replace advice given to you by your health care provider. Make sure you discuss any questions you have with your health  care provider.     Fall Prevention and Home Safety Falls cause injuries and can affect all age groups. It is possible to use preventive measures to significantly decrease the likelihood of falls. There are many simple measures which can make your home safer and prevent falls. OUTDOORS  Repair cracks and edges of walkways and driveways.  Remove high doorway thresholds.  Trim shrubbery on the main path into your home.  Have good outside lighting.  Clear walkways of tools, rocks, debris, and clutter.  Check that handrails are not broken and are securely fastened. Both sides of steps should have handrails.  Have leaves, snow, and ice cleared regularly.  Use sand or salt on walkways during winter months.  In the garage, clean up grease or oil spills. BATHROOM  Install night lights.  Install grab bars by the toilet and in the tub and shower.  Use non-skid  mats or decals in the tub or shower.  Place a plastic non-slip stool in the shower to sit on, if needed.  Keep floors dry and clean up all water on the floor immediately.  Remove soap buildup in the tub or shower on a regular basis.  Secure bath mats with non-slip, double-sided rug tape.  Remove throw rugs and tripping hazards from the floors. BEDROOMS  Install night lights.  Make sure a bedside light is easy to reach.  Do not use oversized bedding.  Keep a telephone by your bedside.  Have a firm chair with side arms to use for getting dressed.  Remove throw rugs and tripping hazards from the floor. KITCHEN  Keep handles on pots and pans turned toward the center of the stove. Use back burners when possible.  Clean up spills quickly and allow time for drying.  Avoid walking on wet floors.  Avoid hot utensils and knives.  Position shelves so they are not too high or low.  Place commonly used objects within easy reach.  If necessary, use a sturdy step stool with a grab bar when reaching.  Keep electrical cables out of the way.  Do not use floor polish or wax that makes floors slippery. If you must use wax, use non-skid floor wax.  Remove throw rugs and tripping hazards from the floor. STAIRWAYS  Never leave objects on stairs.  Place handrails on both sides of stairways and use them. Fix any loose handrails. Make sure handrails on both sides of the stairways are as long as the stairs.  Check carpeting to make sure it is firmly attached along stairs. Make repairs to worn or loose carpet promptly.  Avoid placing throw rugs at the top or bottom of stairways, or properly secure the rug with carpet tape to prevent slippage. Get rid of throw rugs, if possible.  Have an electrician put in a light switch at the top and bottom of the stairs. OTHER FALL PREVENTION TIPS  Wear low-heel or rubber-soled shoes that are supportive and fit well. Wear closed toe shoes.  When  using a stepladder, make sure it is fully opened and both spreaders are firmly locked. Do not climb a closed stepladder.  Add color or contrast paint or tape to grab bars and handrails in your home. Place contrasting color strips on first and last steps.  Learn and use mobility aids as needed. Install an electrical emergency response system.  Turn on lights to avoid dark areas. Replace light bulbs that burn out immediately. Get light switches that glow.  Arrange furniture to create clear  pathways. Keep furniture in the same place.  Firmly attach carpet with non-skid or double-sided tape.  Eliminate uneven floor surfaces.  Select a carpet pattern that does not visually hide the edge of steps.  Be aware of all pets. OTHER HOME SAFETY TIPS  Set the water temperature for 120 F (48.8 C).  Keep emergency numbers on or near the telephone.  Keep smoke detectors on every level of the home and near sleeping areas. Document Released: 08/19/2002 Document Revised: 02/28/2012 Document Reviewed: 11/18/2011 [facility] Patient Information 2015 [place], [place]. This information is not intended to replace advice given to you by your health care provider. Make sure you discuss any questions you have with your health care provider.

## 2015-01-13 NOTE — Progress Notes (Addendum)
Subjective:   Cassandra [name] is a 59 y.o. female who presents for an Initial Medicare Annual Wellness Visit.  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  She has neck pain with left arm radiculopathy for which she is followed by pain management.  She has been under significant amounts of stress related to the death of her mother and marital problems.  She has one son and two grandsons whom she enjoys spending time with.               Today's Vitals   01/13/15 1433  BP: 141/76  Height: 5\' 6"  (1.676 [name])  Weight: 147 lb (66.679 kg)    Current Medications (verified) Outpatient Encounter Prescriptions as of 01/13/2015  Medication Sig  . ALPRAZolam (XANAX) 0.5 MG tablet TAKE 1 TABLET BY MOUTH TWICE A DAY AS NEEDED FOR ANXIETY  . aspirin EC 81 MG tablet Take 81 mg by mouth daily.  . Bisacodyl (DULCOLAX PO) Take by mouth.  . Cholecalciferol (VITAMIN D) 1000 UNITS capsule Take 2,000 Units by mouth 2 (two) times daily.   Marland Kitchen conjugated estrogens (PREMARIN) vaginal cream Place vaginally daily.    Marland Kitchen escitalopram (LEXAPRO) 10 MG tablet Take 2 tablets (20 mg total) by mouth at bedtime. TAKE 1 TABLET (10 MG TOTAL) BY MOUTH DAILY. (Patient taking differently: Take 20 mg by mouth at bedtime. )  . fexofenadine (ALLEGRA) 180 MG tablet Take 180 mg by mouth daily as needed.    . gabapentin (NEURONTIN) 100 MG capsule Take 1 capsule (100 mg total) by mouth 2 (two) times daily.  Marland Kitchen gabapentin (NEURONTIN) 300 MG capsule TAKE 1 CAPSULE (300 MG TOTAL) BY MOUTH 3 (THREE) TIMES DAILY.  Marland Kitchen lidocaine (LIDODERM) 5 % Place 1 patch onto the skin as needed. Remove & Discard patch within 12 hours or as directed by MD   . meclizine (ANTIVERT) 25 MG tablet TAKE 1 TABLET (25 MG TOTAL) BY MOUTH 2 (TWO) TIMES DAILY AS NEEDED.  . methocarbamol (ROBAXIN) 500 MG tablet 1 TAB EVERY 6 HOURS AS NEEDED FOR MUSCLE SPASM-NEED TO LAST 1 MONTH  . naproxen (NAPROSYN) 500 MG tablet TAKE 1 TABLET (500 MG  TOTAL) BY MOUTH 2 (TWO) TIMES DAILY WITH A MEAL.  Marland Kitchen omeprazole (PRILOSEC) 20 MG capsule TAKE 1 CAPSULE (20 MG TOTAL) BY MOUTH DAILY.  Marland Kitchen oxyCODONE-acetaminophen (PERCOCET) 5-325 MG per tablet Take 1 tablet by mouth every 8 (eight) hours as needed.  . phenazopyridine (PYRIDIUM) 200 MG tablet Take 200 mg by mouth 3 (three) times daily as needed for pain.  Marland Kitchen zolpidem (AMBIEN) 5 MG tablet Take 1 tablet (5 mg total) by mouth at bedtime as needed.   No facility-administered encounter medications on file as of 01/13/2015.    Allergies (verified) Codeine; Cymbalta; Livalo; Metoprolol; Oxycodone-acetaminophen; Oxycodone-aspirin; Oxycontin; Propoxyphene n-acetaminophen; Zithromax; and Other   History: Past Medical History  Diagnosis Date  . Nicotine addiction     2 packs per day   . Hyperlipidemia   . Chronic left shoulder pain     frozen   . Tubular adenoma of colon 01/31/2000    Dr. Richmond [name]  . Maternal complication related to childbirth     Sepsis, staph  . Skin cancer     Rt. leg, squamous cell  . Anxiety   . Depression   . COPD (chronic obstructive pulmonary disease)     Dr. Redge [name]   Past Surgical History  Procedure Laterality Date  .  Bladder repair      sling  . Shoulder arthroscopy      multiple  . Abdominal hysterectomy      age 60  . Knee arthroscopy Left    Family History  Problem Relation Age of Onset  . Coronary artery disease Mother   . Diabetes Mother     borderline  . Kidney disease Mother     stage 4  . Coronary artery disease Father   . Prostate cancer Father   . Lung disease Father     black lung  . Liver cancer Maternal Grandmother   . Liver disease Maternal Grandmother   . Colon cancer      Both sides  . Polymyalgia rheumatica Sister   . Coronary artery disease Brother     heart attack  . COPD Brother    Social History   Occupational History  . Homemaker    Social History Main Topics  . Smoking status: Current Every Day Smoker --  1.00 packs/day    Types: Cigarettes  . Smokeless tobacco: Never Used     Comment: would like to try chantix, but has not started on it due to warnings related to depression  . Alcohol Use: No  . Drug Use: No  . Sexual Activity: Not Currently    Tobacco Counseling Ready to quit: No Counseling given: Yes   Activities of Daily Living In your present state of health, do you have any difficulty performing the following activities: 01/13/2015  Hearing? N  Vision? N  Difficulty concentrating or making decisions? Y  Walking or climbing stairs? Y  Dressing or bathing? N  Doing errands, shopping? Y  Preparing Food and eating ? N  Using the Toilet? N  In the past six months, have you accidently leaked urine? Y  Do you have problems with loss of bowel control? N  Managing your Medications? N  Managing your Finances? N  Housekeeping or managing your Housekeeping? Y    Immunizations and Health Maintenance Immunization History  Administered Date(s) Administered  . Influenza Split 06/21/2010  . Influenza, Seasonal, Injecte, Preservative Fre 06/25/2014  . Influenza,inj,Quad PF,36+ Mos 07/09/2013  . Td 05/13/2008   Health Maintenance Due  Topic Date Due  . HIV Screening  12/08/1970  . MAMMOGRAM  12/05/2014   Declined HIV screening today Mammogram scheduled for 02/04/15 at [facility] mobile unit.     Patient Care Team: Cassandra Fraise, MD as PCP - General (Family Medicine)       Assessment:   This is a routine wellness examination for Cassandra [name].   Hearing/Vision screen Patient is up to date on eye exam- seen 2 months ago at [facility] in [place], [place]. No history of gluacoma or cataracts.  No hearing deficit noted  Dietary issues and exercise activities discussed: Current Exercise Habits:: The patient does not participate in regular exercise at present  Goals    None    Increase exercise to at least 3 times per week for 30 minutes  Depression Screen PHQ 2/9 Scores 01/13/2015  12/15/2014 10/01/2014  PHQ - 2 Score 2 4 4   PHQ- 9 Score 9 21 19     Fall Risk Fall Risk  01/13/2015  Falls in the past year? Yes  Number falls in past yr: 2 or more  Injury with Fall? No  Risk for fall due to : Impaired balance/gait  States she has vertigo which causes occassional unsteadiness on her feet.   Fall precautions discussed   Cognitive  Function: MMS- total 28  Screening Tests Health Maintenance  Topic Date Due  . HIV Screening  12/08/1970  . MAMMOGRAM  12/05/2014  . INFLUENZA VACCINE  04/13/2015  . PAP SMEAR  12/10/2016  . TETANUS/TDAP  05/13/2018  . COLONOSCOPY  06/16/2018   HIV screening declined Mammogram scheduled for 02/04/15   Plan:     During the course of the visit, Cassandra [name] was educated and counseled about the following appropriate screening and preventive services:   Vaccines to include Pneumoccal, Influenza, Hepatitis B, Td, Zostavax, HCV- up to date on appropriate vaccines  Electrocardiogram- done 10/01/14   Cardiovascular disease screening- recommend at visit with Dr. Livia Snellen 02/05/15  Colorectal cancer screening- up to date until 2019- patient states she gets more frequently than 10 years due to family history- she plans on scheduling colonoscopy this year  Diabetes screening- recommend at visit with Dr. Livia Snellen 02/05/15  Glaucoma screening- done  Mammography/PAP- mammogram scheduled  Nutrition counseling- discussed today  Smoking cessation counseling- discussed today  Patient Instructions (the written plan) were given to the patient.    Cassandra Coone M, RN   01/13/2015      I have reviewed and agree with the above AWV documentation.  Cassandra [name], [name].D.

## 2015-01-22 ENCOUNTER — Other Ambulatory Visit: Payer: Self-pay | Admitting: Registered Nurse

## 2015-01-26 ENCOUNTER — Other Ambulatory Visit: Payer: Self-pay | Admitting: *Deleted

## 2015-01-26 MED ORDER — MECLIZINE HCL 25 MG PO TABS: ORAL_TABLET | ORAL | Status: DC

## 2015-02-04 DIAGNOSIS — Z1231 Encounter for screening mammogram for malignant neoplasm of breast: Secondary | ICD-10-CM | POA: Diagnosis not present

## 2015-02-05 ENCOUNTER — Encounter: Payer: Self-pay | Admitting: Family Medicine

## 2015-02-05 ENCOUNTER — Ambulatory Visit (INDEPENDENT_AMBULATORY_CARE_PROVIDER_SITE_OTHER): Payer: Medicare Other | Admitting: Family Medicine

## 2015-02-05 VITALS — BP 113/69 | HR 97 | Temp 98.4°F | Ht 66.0 in | Wt 144.0 lb

## 2015-02-05 DIAGNOSIS — F17200 Nicotine dependence, unspecified, uncomplicated: Secondary | ICD-10-CM

## 2015-02-05 DIAGNOSIS — N390 Urinary tract infection, site not specified: Secondary | ICD-10-CM | POA: Diagnosis not present

## 2015-02-05 DIAGNOSIS — H8103 Meniere's disease, bilateral: Secondary | ICD-10-CM

## 2015-02-05 DIAGNOSIS — R8271 Bacteriuria: Secondary | ICD-10-CM

## 2015-02-05 DIAGNOSIS — F411 Generalized anxiety disorder: Secondary | ICD-10-CM

## 2015-02-05 DIAGNOSIS — G894 Chronic pain syndrome: Secondary | ICD-10-CM | POA: Diagnosis not present

## 2015-02-05 LAB — POCT URINALYSIS DIPSTICK
Bilirubin, UA: NEGATIVE
GLUCOSE UA: NEGATIVE
Ketones, UA: NEGATIVE
LEUKOCYTES UA: NEGATIVE
Nitrite, UA: POSITIVE
PH UA: 6
Protein, UA: NEGATIVE
RBC UA: NEGATIVE
Spec Grav, UA: 1.02
UROBILINOGEN UA: NEGATIVE

## 2015-02-05 MED ORDER — VARENICLINE TARTRATE 0.5 MG X 11 & 1 MG X 42 PO MISC: ORAL | Status: DC

## 2015-02-05 MED ORDER — ALPRAZOLAM 1 MG PO TABS: ORAL_TABLET | ORAL | Status: DC

## 2015-02-05 NOTE — Progress Notes (Signed)
Subjective:  Patient ID: Cassandra [name], female    DOB: 04/23/1956  Age: 59 y.o. MRN: [id number]  CC: Dizziness; Palpitations; frequent uti; and Nicotine Dependence   HPI MALEIYAH RELEFORD presents for follow-up of Mnire's disease. She has had for many years intermittent episodes of vertigo. Some of them can last several days. The one that is ongoing now has been started 3 days ago. It has been associated with vomiting and nausea. She feels that the room is spinning and she also was concerned about loss of memory she has failures of registration for cough, and objects. However she is able to remember the day date place and is oriented to person etc. She does have ringing of her years although she denies hearing loss. And she does experience palpitations. She is interested in smoking cessation. She gets very anxious frequently and this becomes a trigger for the Mnire's. At other times the Mnire's triggers her anxiety area she feels panicky like something bad will happen. She has some relief for the Xanax. She worries a lot.  She says the palpitations got much better when she tried the Toprol. However she started having side effects and discontinued the medication.  History Illeana has a past medical history of Nicotine addiction; Hyperlipidemia; Chronic left shoulder pain; Tubular adenoma of colon (01/31/2000); Maternal complication related to childbirth; Skin cancer; Anxiety; Depression; and COPD (chronic obstructive pulmonary disease).   She has past surgical history that includes Bladder repair; Shoulder arthroscopy; Abdominal hysterectomy; and Knee arthroscopy (Left).  CC Her family history includes COPD in her brother; Colon cancer in an other family member; Coronary artery disease in her brother, father, and mother; Diabetes in her mother; Kidney disease in her mother; Liver cancer in her maternal grandmother; Liver disease in her maternal grandmother; Lung disease in her father; Polymyalgia  rheumatica in her sister; Prostate cancer in her father.She reports that she has been smoking Cigarettes.  She has been smoking about 1.00 pack per day. She has never used smokeless tobacco. She reports that she does not drink alcohol or use illicit drugs.    ROS Review of Systems  Constitutional: Negative for fever, chills, diaphoresis, appetite change, fatigue and unexpected weight change.  HENT: Negative for congestion, ear pain, hearing loss, postnasal drip, rhinorrhea, sneezing, sore throat and trouble swallowing.   Eyes: Negative for pain.  Respiratory: Negative for cough, chest tightness and shortness of breath.   Cardiovascular: Negative for chest pain and palpitations.  Gastrointestinal: Negative for nausea, vomiting, abdominal pain, diarrhea and constipation.  Genitourinary: Negative for dysuria, frequency and menstrual problem.  Musculoskeletal: Negative for joint swelling and arthralgias.  Skin: Negative for rash.  Neurological: Negative for dizziness, weakness, numbness and headaches.  Psychiatric/Behavioral: Negative for dysphoric mood and agitation.    Objective:  BP 113/69 mmHg  Pulse 97  Temp(Src) 98.4 F (36.9 C) (Oral)  Ht _0  (1.676 m)  Wt 144 lb (65.318 kg)  BMI 23.25 kg/m2  BP Readings from Last 3 Encounters:  02/05/15 113/69  01/13/15 141/76  01/12/15 129/83    Wt Readings from Last 3 Encounters:  02/05/15 144 lb (65.318 kg)  01/13/15 147 lb (66.679 kg)  10/01/14 148 lb (67.132 kg)     Physical Exam  Constitutional: She is oriented to person, place, and time. She appears well-developed and well-nourished. No distress.  HENT:  Head: Normocephalic and atraumatic.  Right Ear: Tympanic membrane and external ear normal.  Left Ear: Tympanic membrane and external ear normal.  Nose: Nose normal.  Mouth/Throat: Oropharynx is clear and moist.  Eyes: Conjunctivae and EOM are normal. Pupils are equal, round, and reactive to light.  Neck: Normal range of  motion. Neck supple. No thyromegaly present.  Cardiovascular: Normal rate, regular rhythm and normal heart sounds.   No murmur heard. Pulmonary/Chest: Effort normal and breath sounds normal. No respiratory distress. She has no wheezes. She has no rales.  Abdominal: Soft. Bowel sounds are normal. She exhibits no distension. There is no tenderness.  Lymphadenopathy:    She has no cervical adenopathy.  Neurological: She is alert and oriented to person, place, and time. She has normal reflexes. She displays normal reflexes. No cranial nerve deficit. She exhibits normal muscle tone. Coordination normal.  Skin: Skin is warm and dry.  Psychiatric: She has a normal mood and affect. Her behavior is normal. Judgment and thought content normal.    No results found for: HGBA1C  Lab Results  Component Value Date   WBC 12.5* 12/10/2013   HGB 14.7 12/10/2013   HCT 45.6 12/10/2013   PLT 317.0 01/22/2013   GLUCOSE 91 12/10/2013   CHOL 157 12/10/2013   TRIG 71 12/10/2013   HDL 64 12/10/2013   LDLCALC 79 12/10/2013   ALT 13 12/10/2013   AST 22 12/10/2013   NA 144 12/10/2013   K 4.1 12/10/2013   CL 104 12/10/2013   CREATININE 0.85 12/10/2013   BUN 11 12/10/2013   CO2 24 12/10/2013   TSH 0.808 12/10/2013    Mr Cervical Spine Wo Contrast  07/09/2014   CLINICAL DATA:  Cervical spondylosis without myelopathy. Left shoulder pain. Left C7 radiculopathy.  EXAM: MRI CERVICAL SPINE WITHOUT CONTRAST  TECHNIQUE: Multiplanar, multisequence MR imaging of the cervical spine was performed. No intravenous contrast was administered.  COMPARISON:  Cervical radiographs 09/20/2013. No prior cervical MRI.  FINDINGS: Normal cervical alignment with straightening of the cervical lordosis. Negative for fracture or mass. No cord compression or cord lesion. Craniocervical junction is normal.  C2-3:  Negative  C3-4:  Negative  C4-5:  Mild facet degeneration on the left.  No significant stenosis  C5-6: Moderate disc  degeneration with disc space narrowing and endplate edema related to disc degeneration. There is spondylosis causing mild spinal stenosis and mild foraminal stenosis bilaterally. No disc protrusion identified  C6-7: Disc degeneration and spondylosis causing mild spinal stenosis and mild foraminal stenosis bilaterally.  C7-T1:  Negative  IMPRESSION: Disc degeneration and spondylosis at C5-6 and C6-7 causing mild spinal stenosis and mild foraminal stenosis bilaterally. No focal disc protrusion.   Electronically Signed   By: Franchot Gallo M.D.   On: 07/09/2014 16:58    Assessment & Plan:   Dezaree was seen today for dizziness, palpitations, frequent uti and nicotine dependence.  Diagnoses and all orders for this visit:  Chronic pain syndrome Orders: -     CMP14+EGFR -     POCT urinalysis dipstick  Meniere's disease, bilateral Orders: -     CMP14+EGFR -     POCT urinalysis dipstick  Generalized anxiety disorder Orders: -     CMP14+EGFR -     POCT urinalysis dipstick  Bacteria in urine Orders: -     Urine culture  Tobacco dependence Orders: -     varenicline (CHANTIX STARTING MONTH PAK) 0.5 MG X 11 & 1 MG X 42 tablet; Take one 0.5 mg tablet by mouth once daily for 3 days, then increase to one 0.5 mg tablet twice daily for 4 days, then increase  to one 1 mg tablet twice daily.  Other orders -     ALPRAZolam (XANAX) 1 MG tablet; Take one tablet twice daily for Meniere's Disease   I have discontinued Ms. Slomski's methocarbamol. I have also changed her ALPRAZolam. Additionally, I am having her start on varenicline. Lastly, I am having her maintain her conjugated estrogens, Vitamin D, fexofenadine, lidocaine, phenazopyridine, Bisacodyl (DULCOLAX PO), aspirin EC, gabapentin, naproxen, omeprazole, oxyCODONE-acetaminophen, escitalopram, zolpidem, and meclizine.  Meds ordered this encounter  Medications  . ALPRAZolam (XANAX) 1 MG tablet    Sig: Take one tablet twice daily for Meniere's  Disease    Dispense:  60 tablet    Refill:  2  . varenicline (CHANTIX STARTING MONTH PAK) 0.5 MG X 11 & 1 MG X 42 tablet    Sig: Take one 0.5 mg tablet by mouth once daily for 3 days, then increase to one 0.5 mg tablet twice daily for 4 days, then increase to one 1 mg tablet twice daily.    Dispense:  53 tablet    Refill:  0     Follow-up: Return in about 1 month (around 03/08/2015).  [name], M.D.

## 2015-02-05 NOTE — Patient Instructions (Addendum)
Use Debrox drops in each ear once a week to prevent earwax. The proper technique is to put for 5 drops in the ear while laying with the side up for that ear. Stay in that position for 15 minutes. Then turnover and treat the other side with for 5 more drops . At the end just allow the drainage to run out on its own. If he will do this weekly it should prevent wax buildup. If you experience cerumen buildup then you may do it daily for 10 days to break down the buildup.

## 2015-02-06 LAB — CMP14+EGFR
ALBUMIN: 4.3 g/dL (ref 3.5–5.5)
ALK PHOS: 105 IU/L (ref 39–117)
ALT: 7 IU/L (ref 0–32)
AST: 18 IU/L (ref 0–40)
Albumin/Globulin Ratio: 1.5 (ref 1.1–2.5)
BUN / CREAT RATIO: 12 (ref 9–23)
BUN: 11 mg/dL (ref 6–24)
Bilirubin Total: 0.2 mg/dL (ref 0.0–1.2)
CHLORIDE: 98 mmol/L (ref 97–108)
CO2: 25 mmol/L (ref 18–29)
CREATININE: 0.89 mg/dL (ref 0.57–1.00)
Calcium: 9.4 mg/dL (ref 8.7–10.2)
GFR calc non Af Amer: 71 mL/min/{1.73_m2} (ref >59)
GFR, EST AFRICAN AMERICAN: 82 mL/min/{1.73_m2} (ref >59)
GLUCOSE: 92 mg/dL (ref 65–99)
Globulin, Total: 2.8 g/dL (ref 1.5–4.5)
Potassium: 4.3 mmol/L (ref 3.5–5.2)
Sodium: 139 mmol/L (ref 134–144)
Total Protein: 7.1 g/dL (ref 6.0–8.5)

## 2015-02-07 ENCOUNTER — Other Ambulatory Visit: Payer: Self-pay | Admitting: Family Medicine

## 2015-02-07 LAB — URINE CULTURE

## 2015-02-07 MED ORDER — SULFAMETHOXAZOLE-TRIMETHOPRIM 800-160 MG PO TABS: 1.0000 | ORAL_TABLET | Freq: Two times a day (BID) | ORAL | Status: DC

## 2015-02-08 ENCOUNTER — Other Ambulatory Visit: Payer: Self-pay | Admitting: Physical Medicine & Rehabilitation

## 2015-02-10 ENCOUNTER — Encounter (HOSPITAL_BASED_OUTPATIENT_CLINIC_OR_DEPARTMENT_OTHER): Payer: Medicare Other | Admitting: Registered Nurse

## 2015-02-10 ENCOUNTER — Encounter: Payer: Self-pay | Admitting: Registered Nurse

## 2015-02-10 ENCOUNTER — Telehealth: Payer: Self-pay | Admitting: *Deleted

## 2015-02-10 VITALS — BP 111/69 | HR 70 | Resp 14

## 2015-02-10 DIAGNOSIS — Z5181 Encounter for therapeutic drug level monitoring: Secondary | ICD-10-CM | POA: Diagnosis not present

## 2015-02-10 DIAGNOSIS — F411 Generalized anxiety disorder: Secondary | ICD-10-CM

## 2015-02-10 DIAGNOSIS — G894 Chronic pain syndrome: Secondary | ICD-10-CM

## 2015-02-10 DIAGNOSIS — M7631 Iliotibial band syndrome, right leg: Secondary | ICD-10-CM | POA: Diagnosis not present

## 2015-02-10 DIAGNOSIS — M7632 Iliotibial band syndrome, left leg: Secondary | ICD-10-CM

## 2015-02-10 DIAGNOSIS — G8929 Other chronic pain: Secondary | ICD-10-CM | POA: Diagnosis not present

## 2015-02-10 DIAGNOSIS — M25512 Pain in left shoulder: Secondary | ICD-10-CM | POA: Diagnosis not present

## 2015-02-10 DIAGNOSIS — Z79899 Other long term (current) drug therapy: Secondary | ICD-10-CM

## 2015-02-10 DIAGNOSIS — M7502 Adhesive capsulitis of left shoulder: Secondary | ICD-10-CM | POA: Diagnosis not present

## 2015-02-10 DIAGNOSIS — M47812 Spondylosis without myelopathy or radiculopathy, cervical region: Secondary | ICD-10-CM | POA: Diagnosis not present

## 2015-02-10 DIAGNOSIS — M542 Cervicalgia: Secondary | ICD-10-CM

## 2015-02-10 DIAGNOSIS — M7918 Myalgia, other site: Secondary | ICD-10-CM

## 2015-02-10 DIAGNOSIS — M7501 Adhesive capsulitis of right shoulder: Secondary | ICD-10-CM | POA: Diagnosis not present

## 2015-02-10 DIAGNOSIS — M791 Myalgia: Secondary | ICD-10-CM | POA: Diagnosis not present

## 2015-02-10 MED ORDER — OXYCODONE-ACETAMINOPHEN 5-325 MG PO TABS: 1.0000 | ORAL_TABLET | Freq: Three times a day (TID) | ORAL | Status: DC | PRN

## 2015-02-10 NOTE — Progress Notes (Signed)
Subjective:    Patient ID: Cassandra [name], female    DOB: [birth year], 59 y.o.   MRN: [id number]  HPI: Cassandra [name] is a 59 year old female who returns for follow up for chronic pain and medication refill. She says her pain is located in her neck, left shoulder and left hip .  She rates her pain 7. Her current exercise regime is walking and gardening. Also states she has been experiencing vertigo her PCP following. Dr. Livia Snellen  prescribed Xanax 1 mg BID, she brought script with her. Her previous order of Xanax discontinued and Dr. Livia Snellen will be prescribing the Xanax. Also was prescribed chantix she's hoping this will help with smoking cessation.   Pain Inventory Average Pain 7 Pain Right Now 7 My pain is constant, sharp, burning, dull, stabbing, tingling and aching  In the last 24 hours, has pain interfered with the following? General activity 7 Relation with others 7 Enjoyment of life 7 What TIME of day is your pain at its worst? morning, night Sleep (in general) Poor  Pain is worse with: bending, sitting and inactivity Pain improves with: heat/ice, pacing activities and medication Relief from Meds: 4  Mobility walk with assistance how many minutes can you walk? ? ability to climb steps?  yes do you drive?  yes  Function not employed: date last employed 2005 disabled: date disabled 2007 I need assistance with the following:  dressing, meal prep, household duties and shopping  Neuro/Psych bladder control problems weakness numbness tremor tingling spasms dizziness confusion depression anxiety  Prior Studies Any changes since last visit?  no  Physicians involved in your care Any changes since last visit?  yes   Family History  Problem Relation Age of Onset  . Coronary artery disease Mother   . Diabetes Mother     borderline  . Kidney disease Mother     stage 4  . Coronary artery disease Father   . Prostate cancer Father   . Lung disease Father    black lung  . Liver cancer Maternal Grandmother   . Liver disease Maternal Grandmother   . Colon cancer      Both sides  . Polymyalgia rheumatica Sister   . Coronary artery disease Brother     heart attack  . COPD Brother    History   Social History  . Marital Status: Married    Spouse Name: N/A  . Number of Children: 1  . Years of Education: N/A   Occupational History  . Homemaker    Social History Main Topics  . Smoking status: Current Every Day Smoker -- 1.00 packs/day    Types: Cigarettes  . Smokeless tobacco: Never Used     Comment: would like to try chantix, but has not started on it due to warnings related to depression  . Alcohol Use: No  . Drug Use: No  . Sexual Activity: Yes   Other Topics Concern  . None   Social History Narrative   Past Surgical History  Procedure Laterality Date  . Bladder repair      sling  . Shoulder arthroscopy      multiple  . Abdominal hysterectomy      age 58  . Knee arthroscopy Left    Past Medical History  Diagnosis Date  . Nicotine addiction     2 packs per day   . Hyperlipidemia   . Chronic left shoulder pain     frozen   .  Tubular adenoma of colon 01/31/2000    Dr. Richmond Campbell  . Maternal complication related to childbirth     Sepsis, staph  . Skin cancer     Rt. leg, squamous cell  . Anxiety   . Depression   . COPD (chronic obstructive pulmonary disease)     Dr. Redge Gainer   BP 111/69 mmHg  Pulse 70  Resp 14  SpO2 97%  Opioid Risk Score:   Fall Risk Score: High Fall Risk (>13 points)`1  Depression screen PHQ 2/9  Depression screen [facility] 2/9 01/13/2015 12/15/2014 10/01/2014  Decreased Interest 1 2 2   Down, Depressed, Hopeless 1 2 2   PHQ - 2 Score 2 4 4   Altered sleeping 1 3 3   Tired, decreased energy 2 3 3   Change in appetite 1 3 3   Feeling bad or failure about yourself  1 2 1   Trouble concentrating 1 3 2   Moving slowly or fidgety/restless 1 3 2   Suicidal thoughts 0 0 1  PHQ-9 Score 9 21 19     Difficult doing work/chores Somewhat difficult - -     Review of Systems  Constitutional:       Night sweats  Poor appetite   Respiratory: Positive for cough, shortness of breath and wheezing.   Gastrointestinal: Positive for nausea, vomiting, abdominal pain and constipation.  Genitourinary: Positive for difficulty urinating.       Retention  Skin:       Concern for skin breakdown  Neurological: Positive for dizziness, tremors, weakness and numbness.       Tingling Spasms   Psychiatric/Behavioral: Positive for confusion and dysphoric mood. The patient is nervous/anxious.   All other systems reviewed and are negative.      Objective:   Physical Exam  Constitutional: She is oriented to person, place, and time. She appears well-developed and well-nourished.  HENT:  Head: Normocephalic.  Neck: Normal range of motion. Neck supple.  Cervical Paraspinal Tenderness: C-5- C-7  Mainly Left Side  Cardiovascular: Normal rate and regular rhythm.   Pulmonary/Chest: Effort normal and breath sounds normal.  Musculoskeletal:  Normal Muscle Bulk and Muscle Testing Reveals: Upper Extremities: Right:Full ROM and Muscle Strength 5/5 Left: Decreased ROM 30 Degrees and Muscle Strength 4/5 Thoracic Paraspinal Tenderness: T-1- T-5 Hypersensitivity Mainly Left Side Lower Extremities: Full ROM and Muscle Strength 5/5 Left Lower Extremity Flexion Produces pain into Left Groin Arises from chair with ease Narrow Based Gait  Neurological: She is alert and oriented to person, place, and time.  Skin: Skin is warm and dry.  Psychiatric: She has a normal mood and affect.  Nursing note and vitals reviewed.         Assessment & Plan:  1. Post-traumatic arthritis left shoulder s/p 2 shoulder surgeries. Now with adhesive capsulitis left shoulder. :  Refilled:oxyCODONE 5/325mg  one tablet every 8 hours as needed. #70  2. Cervicalgia, spondylosis: Continue Current Medication Regime.Lidoderm  patches, Robaxin, Naprosyn, and Gabapentin 3. Left Iliotibial band syndrome: Continue exercise regime 4. Depression/insomnia/anxiety: Continue Lexapro, she has noticed improvement with increase Lexapro. Continue Ambien 5 mg HS #30.   20 minutes of face to face patient care time was spent during this isit. All questions were encouraged and answered

## 2015-02-10 NOTE — Telephone Encounter (Signed)
-----   Message from [name], MD sent at 02/10/2015  8:25 AM EDT ----- Burtis Junes, Your urine culture was positive. I have sent him an antibiotic. Please pick it up from your drugstore and take it as directed. Best Regards, [name], M.D.

## 2015-02-14 ENCOUNTER — Other Ambulatory Visit: Payer: Self-pay | Admitting: Physical Medicine & Rehabilitation

## 2015-02-16 ENCOUNTER — Telehealth: Payer: Self-pay | Admitting: Family Medicine

## 2015-02-16 NOTE — Telephone Encounter (Signed)
Patient states that she has only 2 pills left and as long as we thought it was ok she will continue until the end. Advised patient that if she continued with any symptoms of a UTI or side effects to come back to office

## 2015-02-16 NOTE — Telephone Encounter (Signed)
Please advise and route to Pool B 

## 2015-02-16 NOTE — Telephone Encounter (Signed)
Have her discontinue the medication. If she was able to take it for 4 or more days, she should drop by for a repeat urine specimen. If she was not able to take it for at least 4 days then call in a replacement antibiotic for her. Use Macrobid 100 mg twice a day for 7 days #14 no refills

## 2015-03-02 ENCOUNTER — Encounter: Payer: Self-pay | Admitting: Family Medicine

## 2015-03-05 ENCOUNTER — Other Ambulatory Visit: Payer: Self-pay | Admitting: Physical Medicine & Rehabilitation

## 2015-03-09 ENCOUNTER — Ambulatory Visit (INDEPENDENT_AMBULATORY_CARE_PROVIDER_SITE_OTHER): Payer: Medicare Other | Admitting: Family Medicine

## 2015-03-09 ENCOUNTER — Encounter: Payer: Self-pay | Admitting: Family Medicine

## 2015-03-09 DIAGNOSIS — H8103 Meniere's disease, bilateral: Secondary | ICD-10-CM | POA: Diagnosis not present

## 2015-03-09 DIAGNOSIS — M791 Myalgia: Secondary | ICD-10-CM

## 2015-03-09 DIAGNOSIS — N3090 Cystitis, unspecified without hematuria: Secondary | ICD-10-CM

## 2015-03-09 DIAGNOSIS — M7918 Myalgia, other site: Secondary | ICD-10-CM

## 2015-03-09 LAB — POCT URINALYSIS DIPSTICK
Bilirubin, UA: NEGATIVE
Blood, UA: NEGATIVE
GLUCOSE UA: NEGATIVE
Ketones, UA: NEGATIVE
Leukocytes, UA: NEGATIVE
NITRITE UA: NEGATIVE
Protein, UA: NEGATIVE
Spec Grav, UA: 1.015
UROBILINOGEN UA: NEGATIVE
pH, UA: 6

## 2015-03-09 LAB — POCT UA - MICROSCOPIC ONLY
BACTERIA, U MICROSCOPIC: NEGATIVE
CRYSTALS, UR, HPF, POC: NEGATIVE
Casts, Ur, LPF, POC: NEGATIVE
Mucus, UA: NEGATIVE
RBC, urine, microscopic: NEGATIVE
WBC, Ur, HPF, POC: NEGATIVE
YEAST UA: NEGATIVE

## 2015-03-09 NOTE — Patient Instructions (Signed)
Start chantix right away as prescribed.

## 2015-03-09 NOTE — Progress Notes (Signed)
Subjective:  Patient ID: Cassandra [name], female    DOB: [birth year]  Age: 59 y.o. MRN: [id number]  CC: Pain and GAD   HPI CHRYS LANDGREBE presents for progress check on smoking cessation and recent UTI. Also has myofascial pain, for which she takes multiple meds. Some relief with oxycodone. Has Pain clinic f/u tomorrow.  Anxiety and depression have improved. Admits she doesn't really want to stop smoking.Has problems with vision being off with her Menieres when hearing changes and vertigo occur. Better since increasing the alprazolam, but is more drowsy.   Didn't start chantix. Was concerned about drowsiness from combination of meds would be too much.  UTI sx resolved. Has had multiple surgeries for bladder suspension. States was told any more would just make it worse History Meloni has a past medical history of Nicotine addiction; Hyperlipidemia; Chronic left shoulder pain; Tubular adenoma of colon (01/31/2000); Maternal complication related to childbirth; Skin cancer; Anxiety; Depression; and COPD (chronic obstructive pulmonary disease).   She has past surgical history that includes Bladder repair; Shoulder arthroscopy; Abdominal hysterectomy; and Knee arthroscopy (Left).   Her family history includes COPD in her brother; Colon cancer in an other family member; Coronary artery disease in her brother, father, and mother; Diabetes in her mother; Kidney disease in her mother; Liver cancer in her maternal grandmother; Liver disease in her maternal grandmother; Lung disease in her father; Polymyalgia rheumatica in her sister; Prostate cancer in her father.She reports that she has been smoking Cigarettes.  She has been smoking about 1.00 pack per day. She has never used smokeless tobacco. She reports that she does not drink alcohol or use illicit drugs.  Outpatient Prescriptions Prior to Visit  Medication Sig Dispense Refill  . ALPRAZolam (XANAX) 1 MG tablet Take one tablet twice daily for Meniere's  Disease 60 tablet 2  . aspirin EC 81 MG tablet Take 81 mg by mouth daily.    . Bisacodyl (DULCOLAX PO) Take by mouth.    . Cholecalciferol (VITAMIN D) 1000 UNITS capsule Take 2,000 Units by mouth 2 (two) times daily.     Marland Kitchen conjugated estrogens (PREMARIN) vaginal cream Place vaginally daily.      Marland Kitchen escitalopram (LEXAPRO) 10 MG tablet Take 2 tablets (20 mg total) by mouth at bedtime. TAKE 1 TABLET (10 MG TOTAL) BY MOUTH DAILY. (Patient taking differently: Take 20 mg by mouth at bedtime. ) 180 tablet 4  . fexofenadine (ALLEGRA) 180 MG tablet Take 180 mg by mouth daily as needed.      . gabapentin (NEURONTIN) 300 MG capsule TAKE 1 CAPSULE (300 MG TOTAL) BY MOUTH 3 (THREE) TIMES DAILY. 90 capsule 3  . lidocaine (LIDODERM) 5 % Place 1 patch onto the skin as needed. Remove & Discard patch within 12 hours or as directed by MD     . meclizine (ANTIVERT) 25 MG tablet TAKE 1 TABLET (25 MG TOTAL) BY MOUTH 2 (TWO) TIMES DAILY AS NEEDED. 20 tablet 0  . naproxen (NAPROSYN) 500 MG tablet TAKE 1 TABLET (500 MG TOTAL) BY MOUTH 2 (TWO) TIMES DAILY WITH A MEAL. 60 tablet 3  . omeprazole (PRILOSEC) 20 MG capsule TAKE 1 CAPSULE (20 MG TOTAL) BY MOUTH DAILY. 30 capsule 2  . oxyCODONE-acetaminophen (PERCOCET) 5-325 MG per tablet Take 1 tablet by mouth every 8 (eight) hours as needed. 70 tablet 0  . zolpidem (AMBIEN) 5 MG tablet Take 1 tablet (5 mg total) by mouth at bedtime as needed. 30 tablet 1  . phenazopyridine (  PYRIDIUM) 200 MG tablet Take 200 mg by mouth 3 (three) times daily as needed for pain.    Marland Kitchen sulfamethoxazole-trimethoprim (BACTRIM DS,SEPTRA DS) 800-160 MG per tablet Take 1 tablet by mouth 2 (two) times daily. (Patient not taking: Reported on 03/09/2015) 14 tablet 0  . varenicline (CHANTIX STARTING MONTH PAK) 0.5 MG X 11 & 1 MG X 42 tablet Take one 0.5 mg tablet by mouth once daily for 3 days, then increase to one 0.5 mg tablet twice daily for 4 days, then increase to one 1 mg tablet twice daily. 53 tablet 0  .  escitalopram (LEXAPRO) 10 MG tablet TAKE 1 TABLET (10 MG TOTAL) BY MOUTH DAILY. (Patient not taking: Reported on 03/09/2015) 90 tablet 3   No facility-administered medications prior to visit.    ROS Review of Systems  Constitutional: Positive for fatigue. Negative for fever, chills, diaphoresis, appetite change and unexpected weight change.  HENT: Negative for congestion, ear pain, hearing loss, postnasal drip, rhinorrhea, sneezing, sore throat and trouble swallowing.   Eyes: Negative for pain.  Respiratory: Negative for cough, chest tightness and shortness of breath.   Cardiovascular: Negative for chest pain and palpitations.  Gastrointestinal: Negative for nausea, vomiting, abdominal pain, diarrhea and constipation.  Genitourinary: Positive for frequency. Negative for dysuria and menstrual problem.  Musculoskeletal: Positive for myalgias and arthralgias. Negative for joint swelling.  Skin: Negative for rash.  Neurological: Positive for dizziness and light-headedness. Negative for weakness, numbness and headaches.  Psychiatric/Behavioral: Negative for dysphoric mood and agitation.    Objective:  There were no vitals taken for this visit.  BP Readings from Last 3 Encounters:  02/10/15 111/69  02/05/15 113/69  01/13/15 141/76    Wt Readings from Last 3 Encounters:  02/05/15 144 lb (65.318 kg)  01/13/15 147 lb (66.679 kg)  10/01/14 148 lb (67.132 kg)     Physical Exam  Constitutional: She is oriented to person, place, and time. She appears well-developed and well-nourished. No distress.  HENT:  Head: Normocephalic and atraumatic.  Right Ear: External ear normal.  Left Ear: External ear normal.  Nose: Nose normal.  Mouth/Throat: Oropharynx is clear and moist.  Eyes: Conjunctivae and EOM are normal. Pupils are equal, round, and reactive to light.  Neck: Normal range of motion. Neck supple. No thyromegaly present.  Cardiovascular: Normal rate, regular rhythm and normal heart  sounds.   No murmur heard. Pulmonary/Chest: Effort normal and breath sounds normal. No respiratory distress. She has no wheezes. She has no rales.  Abdominal: Soft. Bowel sounds are normal. She exhibits no distension. There is no tenderness.  Lymphadenopathy:    She has no cervical adenopathy.  Neurological: She is alert and oriented to person, place, and time. She has normal reflexes.  Skin: Skin is warm and dry.  Psychiatric: She has a normal mood and affect. Her behavior is normal. Judgment and thought content normal.    No results found for: HGBA1C  Lab Results  Component Value Date   WBC 12.5* 12/10/2013   HGB 14.7 12/10/2013   HCT 45.6 12/10/2013   PLT 317.0 01/22/2013   GLUCOSE 92 02/05/2015   CHOL 157 12/10/2013   TRIG 71 12/10/2013   HDL 64 12/10/2013   LDLCALC 79 12/10/2013   ALT 7 02/05/2015   AST 18 02/05/2015   NA 139 02/05/2015   K 4.3 02/05/2015   CL 98 02/05/2015   CREATININE 0.89 02/05/2015   BUN 11 02/05/2015   CO2 25 02/05/2015   TSH 0.808 12/10/2013  Mr Cervical Spine Wo Contrast  07/09/2014   CLINICAL DATA:  Cervical spondylosis without myelopathy. Left shoulder pain. Left C7 radiculopathy.  EXAM: MRI CERVICAL SPINE WITHOUT CONTRAST  TECHNIQUE: Multiplanar, multisequence MR imaging of the cervical spine was performed. No intravenous contrast was administered.  COMPARISON:  Cervical radiographs 09/20/2013. No prior cervical MRI.  FINDINGS: Normal cervical alignment with straightening of the cervical lordosis. Negative for fracture or mass. No cord compression or cord lesion. Craniocervical junction is normal.  C2-3:  Negative  C3-4:  Negative  C4-5:  Mild facet degeneration on the left.  No significant stenosis  C5-6: Moderate disc degeneration with disc space narrowing and endplate edema related to disc degeneration. There is spondylosis causing mild spinal stenosis and mild foraminal stenosis bilaterally. No disc protrusion identified  C6-7: Disc  degeneration and spondylosis causing mild spinal stenosis and mild foraminal stenosis bilaterally.  C7-T1:  Negative  IMPRESSION: Disc degeneration and spondylosis at C5-6 and C6-7 causing mild spinal stenosis and mild foraminal stenosis bilaterally. No focal disc protrusion.   Electronically Signed   By: Franchot Gallo M.D.   On: 07/09/2014 16:58    Assessment & Plan:   Daneya was seen today for pain and gad.  Diagnoses and all orders for this visit:  Cystitis Orders: -     POCT urinalysis dipstick -     POCT UA - Microscopic Only  Meniere's disease, bilateral  Myofascial pain   I have discontinued Ms. Dill's phenazopyridine. I am also having her maintain her conjugated estrogens, Vitamin D, fexofenadine, lidocaine, Bisacodyl (DULCOLAX PO), aspirin EC, omeprazole, escitalopram, zolpidem, meclizine, ALPRAZolam, varenicline, sulfamethoxazole-trimethoprim, gabapentin, oxyCODONE-acetaminophen, naproxen, and methocarbamol.   Start chantix right away as prescribed.   Follow-up: Return in about 6 months (around 09/08/2015).  [name], M.D.

## 2015-03-10 ENCOUNTER — Encounter: Payer: Medicare Other | Attending: Physical Medicine & Rehabilitation | Admitting: Registered Nurse

## 2015-03-10 ENCOUNTER — Encounter: Payer: Self-pay | Admitting: Registered Nurse

## 2015-03-10 ENCOUNTER — Encounter: Payer: Self-pay | Admitting: Physical Medicine & Rehabilitation

## 2015-03-10 VITALS — BP 130/90 | HR 68 | Resp 14

## 2015-03-10 DIAGNOSIS — M7501 Adhesive capsulitis of right shoulder: Secondary | ICD-10-CM | POA: Diagnosis not present

## 2015-03-10 DIAGNOSIS — M542 Cervicalgia: Secondary | ICD-10-CM

## 2015-03-10 DIAGNOSIS — Z79899 Other long term (current) drug therapy: Secondary | ICD-10-CM | POA: Diagnosis not present

## 2015-03-10 DIAGNOSIS — M7502 Adhesive capsulitis of left shoulder: Secondary | ICD-10-CM | POA: Diagnosis not present

## 2015-03-10 DIAGNOSIS — Z5181 Encounter for therapeutic drug level monitoring: Secondary | ICD-10-CM | POA: Diagnosis not present

## 2015-03-10 DIAGNOSIS — F411 Generalized anxiety disorder: Secondary | ICD-10-CM

## 2015-03-10 DIAGNOSIS — M791 Myalgia: Secondary | ICD-10-CM | POA: Diagnosis not present

## 2015-03-10 DIAGNOSIS — G894 Chronic pain syndrome: Secondary | ICD-10-CM | POA: Diagnosis not present

## 2015-03-10 DIAGNOSIS — M7631 Iliotibial band syndrome, right leg: Secondary | ICD-10-CM | POA: Diagnosis not present

## 2015-03-10 DIAGNOSIS — M47812 Spondylosis without myelopathy or radiculopathy, cervical region: Secondary | ICD-10-CM | POA: Diagnosis not present

## 2015-03-10 DIAGNOSIS — G8929 Other chronic pain: Secondary | ICD-10-CM | POA: Diagnosis not present

## 2015-03-10 DIAGNOSIS — M7918 Myalgia, other site: Secondary | ICD-10-CM

## 2015-03-10 DIAGNOSIS — M25512 Pain in left shoulder: Secondary | ICD-10-CM | POA: Diagnosis not present

## 2015-03-10 MED ORDER — OXYCODONE-ACETAMINOPHEN 5-325 MG PO TABS: 1.0000 | ORAL_TABLET | Freq: Three times a day (TID) | ORAL | Status: DC | PRN

## 2015-03-10 NOTE — Progress Notes (Signed)
Subjective:    Patient ID: Cassandra [name], female    DOB: 1956-03-31, 59 y.o.   MRN: [id number]  HPI: [name] is a 59 year old female who returns for follow up for chronic pain and medication refill. She says her pain is located in her left shoulder. She rates her pain 7. Her current exercise regime is walking.  Pain Inventory Average Pain 7 Pain Right Now 7 My pain is constant, sharp, burning, stabbing, tingling and aching  In the last 24 hours, has pain interfered with the following? General activity 7 Relation with others 7 Enjoyment of life 7 What TIME of day is your pain at its worst? morning and night Sleep (in general) Poor  Pain is worse with: bending, sitting, inactivity, standing and some activites Pain improves with: rest, heat/ice, pacing activities, medication and hot baths Relief from Meds: 6  Mobility walk without assistance how many minutes can you walk? unsure ability to climb steps?  yes do you drive?  yes  Function not employed: date last employed .  Neuro/Psych bladder control problems bowel control problems weakness numbness tremor tingling spasms dizziness confusion depression anxiety  Prior Studies Any changes since last visit?  no  Physicians involved in your care Any changes since last visit?  no   Family History  Problem Relation Age of Onset  . Coronary artery disease Mother   . Diabetes Mother     borderline  . Kidney disease Mother     stage 4  . Coronary artery disease Father   . Prostate cancer Father   . Lung disease Father     black lung  . Liver cancer Maternal Grandmother   . Liver disease Maternal Grandmother   . Colon cancer      Both sides  . Polymyalgia rheumatica Sister   . Coronary artery disease Brother     heart attack  . COPD Brother    History   Social History  . Marital Status: Married    Spouse Name: N/A  . Number of Children: 1  . Years of Education: N/A   Occupational History    . Homemaker    Social History Main Topics  . Smoking status: Current Every Day Smoker -- 1.00 packs/day    Types: Cigarettes  . Smokeless tobacco: Never Used     Comment: would like to try chantix, but has not started on it due to warnings related to depression  . Alcohol Use: No  . Drug Use: No  . Sexual Activity: Yes   Other Topics Concern  . None   Social History Narrative   Past Surgical History  Procedure Laterality Date  . Bladder repair      sling  . Shoulder arthroscopy      multiple  . Abdominal hysterectomy      age 21  . Knee arthroscopy Left    Past Medical History  Diagnosis Date  . Nicotine addiction     2 packs per day   . Hyperlipidemia   . Chronic left shoulder pain     frozen   . Tubular adenoma of colon 01/31/2000    Dr. Richmond Campbell  . Maternal complication related to childbirth     Sepsis, staph  . Skin cancer     Rt. leg, squamous cell  . Anxiety   . Depression   . COPD (chronic obstructive pulmonary disease)     Dr. Redge Gainer   BP 130/90 mmHg  Pulse 68  Resp 14  SpO2 99%  Opioid Risk Score:   Fall Risk Score: Low Fall Risk (0-5 points)`1  Depression screen PHQ 2/9  Depression screen [facility] 2/9 03/09/2015 01/13/2015 12/15/2014 10/01/2014  Decreased Interest 2 1 2 2   Down, Depressed, Hopeless 1 1 2 2   PHQ - 2 Score 3 2 4 4   Altered sleeping 3 1 3 3   Tired, decreased energy 3 2 3 3   Change in appetite 3 1 3 3   Feeling bad or failure about yourself  0 1 2 1   Trouble concentrating 2 1 3 2   Moving slowly or fidgety/restless 2 1 3 2   Suicidal thoughts 0 0 0 1  PHQ-9 Score 16 9 21 19   Difficult doing work/chores - Somewhat difficult - -     Review of Systems  Constitutional: Positive for appetite change and unexpected weight change.       Night sweats, poor appetite  HENT: Negative.   Eyes: Negative.   Respiratory: Positive for cough and wheezing.        Sleep apnea w/CPAP  Cardiovascular: Negative.   Gastrointestinal: Positive  for nausea, vomiting, abdominal pain and constipation.       Bowel control problems  Endocrine: Negative.   Genitourinary: Positive for dysuria.       Painful urination, bladder control problems  Musculoskeletal: Positive for myalgias, back pain and arthralgias.  Skin: Negative.   Allergic/Immunologic: Negative.   Neurological: Positive for dizziness, tremors, weakness and numbness.       Tingling  Hematological: Negative.   Psychiatric/Behavioral: Positive for confusion and dysphoric mood. The patient is nervous/anxious.        Objective:   Physical Exam  Constitutional: She is oriented to person, place, and time. She appears well-developed and well-nourished.  HENT:  Head: Normocephalic.  Neck: Normal range of motion. Neck supple.  Cardiovascular: Normal rate and regular rhythm.   Pulmonary/Chest: Effort normal and breath sounds normal.  Musculoskeletal:  Normal Muscle Bulk and Muscle testing Reveals: Upper Extremities: Right:Full ROM and Muscle Strength 5/5 Left: Decreased ROM 30 Degrees and muscle Strength 4/5 Thoracic Paraspinal Tenderness: T-1- T-3 Left Side Lower Extremities: Full ROM and Muscle Strength 5/5 Arises from chair with ease Narrow Based gait  Neurological: She is alert and oriented to person, place, and time.  Skin: Skin is warm and dry.  Psychiatric: She has a normal mood and affect.  Nursing note and vitals reviewed.         Assessment & Plan:  1. Post-traumatic arthritis left shoulder s/p 2 shoulder surgeries. Now with adhesive capsulitis left shoulder. :  Refilled:oxyCODONE 5/325mg  one tablet every 8 hours as needed. #70. Second script given to accommodate scheduled appointment. 2. Cervicalgia, spondylosis: Continue Current Medication Regime.Lidoderm patches, Robaxin, Naprosyn, and Gabapentin 3. Left Iliotibial band syndrome: Continue exercise regime 4. Depression/insomnia/anxiety: Continue Lexapro, she has noticed improvement with increase  Lexapro. Continue Ambien 5 mg HS #30. Dr. Livia Snellen prescribing Xanax.   20 minutes of face to face patient care time was spent during this isit. All questions were encouraged and answered

## 2015-03-11 ENCOUNTER — Telehealth: Payer: Self-pay | Admitting: *Deleted

## 2015-03-11 NOTE — Telephone Encounter (Signed)
Pt notified of results Verbalizes understanding 

## 2015-03-11 NOTE — Telephone Encounter (Signed)
-----   Message from [name], MD sent at 03/09/2015  2:24 PM EDT ----- Burtis Junes,    Your lab result is normal.Some minor variations that are not significant are commonly marked abnormal, but do not represent any medical problem for you.  Best regards, [name], M.D.

## 2015-03-13 ENCOUNTER — Other Ambulatory Visit: Payer: Self-pay | Admitting: Registered Nurse

## 2015-03-17 ENCOUNTER — Other Ambulatory Visit: Payer: Self-pay | Admitting: Physical Medicine & Rehabilitation

## 2015-03-17 MED ORDER — ZOLPIDEM TARTRATE 5 MG PO TABS: 5.0000 mg | ORAL_TABLET | Freq: Every evening | ORAL | Status: DC | PRN

## 2015-03-24 ENCOUNTER — Other Ambulatory Visit: Payer: Self-pay | Admitting: Family Medicine

## 2015-04-08 ENCOUNTER — Other Ambulatory Visit: Payer: Self-pay | Admitting: Registered Nurse

## 2015-04-13 ENCOUNTER — Other Ambulatory Visit: Payer: Self-pay | Admitting: Family Medicine

## 2015-04-21 ENCOUNTER — Encounter: Payer: Medicare Other | Attending: Physical Medicine & Rehabilitation | Admitting: Registered Nurse

## 2015-04-21 ENCOUNTER — Encounter: Payer: Self-pay | Admitting: Registered Nurse

## 2015-04-21 VITALS — BP 116/64 | HR 60 | Resp 14

## 2015-04-21 DIAGNOSIS — M7502 Adhesive capsulitis of left shoulder: Secondary | ICD-10-CM | POA: Diagnosis not present

## 2015-04-21 DIAGNOSIS — M542 Cervicalgia: Secondary | ICD-10-CM | POA: Insufficient documentation

## 2015-04-21 DIAGNOSIS — M791 Myalgia: Secondary | ICD-10-CM | POA: Insufficient documentation

## 2015-04-21 DIAGNOSIS — Z79899 Other long term (current) drug therapy: Secondary | ICD-10-CM | POA: Insufficient documentation

## 2015-04-21 DIAGNOSIS — M7501 Adhesive capsulitis of right shoulder: Secondary | ICD-10-CM | POA: Insufficient documentation

## 2015-04-21 DIAGNOSIS — M7631 Iliotibial band syndrome, right leg: Secondary | ICD-10-CM | POA: Insufficient documentation

## 2015-04-21 DIAGNOSIS — M25512 Pain in left shoulder: Secondary | ICD-10-CM | POA: Diagnosis not present

## 2015-04-21 DIAGNOSIS — Z5181 Encounter for therapeutic drug level monitoring: Secondary | ICD-10-CM | POA: Diagnosis not present

## 2015-04-21 DIAGNOSIS — M7918 Myalgia, other site: Secondary | ICD-10-CM

## 2015-04-21 DIAGNOSIS — F411 Generalized anxiety disorder: Secondary | ICD-10-CM | POA: Diagnosis not present

## 2015-04-21 DIAGNOSIS — M47812 Spondylosis without myelopathy or radiculopathy, cervical region: Secondary | ICD-10-CM

## 2015-04-21 DIAGNOSIS — G8929 Other chronic pain: Secondary | ICD-10-CM | POA: Diagnosis not present

## 2015-04-21 DIAGNOSIS — G894 Chronic pain syndrome: Secondary | ICD-10-CM | POA: Diagnosis not present

## 2015-04-21 MED ORDER — OXYCODONE-ACETAMINOPHEN 5-325 MG PO TABS: 1.0000 | ORAL_TABLET | Freq: Three times a day (TID) | ORAL | Status: DC | PRN

## 2015-04-21 NOTE — Progress Notes (Signed)
Subjective:    Patient ID: Cassandra [name], female    DOB: [birth year], 59 y.o.   MRN: [id number]  HPI: Cassandra [name] is a 59 year old female who returns for follow up for chronic pain and medication refill. She says her pain is located in her left shoulder. She rates her pain 7. Her current exercise regime is walking.   Pain Inventory Average Pain 7 Pain Right Now 7 My pain is intermittent, constant, sharp, burning, dull, stabbing, tingling and aching  In the last 24 hours, has pain interfered with the following? General activity 7 Relation with others 8 Enjoyment of life 4 What TIME of day is your pain at its worst? ALL Sleep (in general) Poor  Pain is worse with: bending, inactivity, standing and some activites Pain improves with: rest, heat/ice, pacing activities and medication Relief from Meds: 7  Mobility walk without assistance how many minutes can you walk? 5 ability to climb steps?  yes do you drive?  yes  Function disabled: date disabled 2007  Neuro/Psych bladder control problems bowel control problems weakness numbness tremor tingling spasms dizziness confusion depression anxiety  Prior Studies Any changes since last visit?  no  Physicians involved in your care Any changes since last visit?  no   Family History  Problem Relation Age of Onset  . Coronary artery disease Mother   . Diabetes Mother     borderline  . Kidney disease Mother     stage 4  . Coronary artery disease Father   . Prostate cancer Father   . Lung disease Father     black lung  . Liver cancer Maternal Grandmother   . Liver disease Maternal Grandmother   . Colon cancer      Both sides  . Polymyalgia rheumatica Sister   . Coronary artery disease Brother     heart attack  . COPD Brother    History   Social History  . Marital Status: Married    Spouse Name: N/A  . Number of Children: 1  . Years of Education: N/A   Occupational History  . Homemaker     Social History Main Topics  . Smoking status: Current Every Day Smoker -- 1.00 packs/day    Types: Cigarettes  . Smokeless tobacco: Never Used     Comment: would like to try chantix, but has not started on it due to warnings related to depression  . Alcohol Use: No  . Drug Use: No  . Sexual Activity: Yes   Other Topics Concern  . None   Social History Narrative   Past Surgical History  Procedure Laterality Date  . Bladder repair      sling  . Shoulder arthroscopy      multiple  . Abdominal hysterectomy      age 16  . Knee arthroscopy Left    Past Medical History  Diagnosis Date  . Nicotine addiction     2 packs per day   . Hyperlipidemia   . Chronic left shoulder pain     frozen   . Tubular adenoma of colon 01/31/2000    Dr. Richmond Campbell  . Maternal complication related to childbirth     Sepsis, staph  . Skin cancer     Rt. leg, squamous cell  . Anxiety   . Depression   . COPD (chronic obstructive pulmonary disease)     Dr. Redge Gainer   BP 116/64 mmHg  Pulse 60  Resp 14  SpO2 98%  Opioid Risk Score:   Fall Risk Score:  `1  Depression screen PHQ 2/9  Depression screen [facility] 2/9 03/09/2015 01/13/2015 12/15/2014 10/01/2014  Decreased Interest 2 1 2 2   Down, Depressed, Hopeless 1 1 2 2   PHQ - 2 Score 3 2 4 4   Altered sleeping 3 1 3 3   Tired, decreased energy 3 2 3 3   Change in appetite 3 1 3 3   Feeling bad or failure about yourself  0 1 2 1   Trouble concentrating 2 1 3 2   Moving slowly or fidgety/restless 2 1 3 2   Suicidal thoughts 0 0 0 1  PHQ-9 Score 16 9 21 19   Difficult doing work/chores - Somewhat difficult - -     Review of Systems  HENT: Negative.   Eyes: Negative.   Respiratory: Positive for cough, shortness of breath and wheezing.        Respiratory infections  Cardiovascular: Negative.   Gastrointestinal: Positive for nausea, vomiting, abdominal pain, diarrhea and constipation.       Bowel control problems  Endocrine: Negative.    Genitourinary:       Bladder control problems, painful urination, urine retention  Musculoskeletal: Positive for myalgias, back pain and arthralgias.  Skin: Negative.   Allergic/Immunologic: Negative.   Neurological: Positive for dizziness, tremors, weakness and numbness.       Tingling, trouble walking, spasms  Hematological: Negative.   Psychiatric/Behavioral: Positive for confusion and dysphoric mood. The patient is nervous/anxious.        Objective:   Physical Exam  Constitutional: She appears well-developed and well-nourished.  HENT:  Head: Normocephalic and atraumatic.  Neck: Normal range of motion. Neck supple.  Cardiovascular: Normal rate and regular rhythm.   Pulmonary/Chest: Effort normal and breath sounds normal.  Musculoskeletal:  Normal Muscle Bulk and Muscle Testing Reveals: Upper Extremities: Full ROM and Muscle Strength 5/5 Left AC Hypersensitivity Thoracic Hypersensitivity( Mainly Left Side) T-1- T-7 Lower Extremities: Full ROM and Muscle Strength 5/5 Arises from chair with ease Narrow Based Gait   Neurological: She is alert.  Skin: Skin is warm and dry.  Psychiatric: She has a normal mood and affect.  Nursing note and vitals reviewed.         Assessment & Plan:  1. Post-traumatic arthritis left shoulder s/p 2 shoulder surgeries. Now with adhesive capsulitis left shoulder. :  Refilled:oxyCODONE 5/325mg  one tablet every 8 hours as needed. #70.  2. Cervicalgia, spondylosis: Continue Current Medication Regime.Lidoderm patches, Robaxin, Naprosyn, and Gabapentin 3. Left Iliotibial band syndrome: Continue exercise regime 4. Depression/insomnia/anxiety: Continue Lexapro. Continue Ambien 5 mg HS #30. Dr. Livia Snellen prescribing Xanax.   20 minutes of face to face patient care time was spent during this isit. All questions were encouraged and answered

## 2015-05-11 ENCOUNTER — Other Ambulatory Visit: Payer: Self-pay | Admitting: Physical Medicine & Rehabilitation

## 2015-05-22 ENCOUNTER — Other Ambulatory Visit: Payer: Self-pay | Admitting: Family Medicine

## 2015-05-22 ENCOUNTER — Other Ambulatory Visit: Payer: Self-pay | Admitting: Physical Medicine & Rehabilitation

## 2015-05-25 NOTE — Telephone Encounter (Signed)
Last filled 04/13/15, last seen 02/05/15. Call in at [facility] 

## 2015-05-27 NOTE — Telephone Encounter (Signed)
Please review and advise.

## 2015-06-04 ENCOUNTER — Encounter: Payer: Self-pay | Admitting: Registered Nurse

## 2015-06-04 ENCOUNTER — Other Ambulatory Visit: Payer: Self-pay | Admitting: Physical Medicine & Rehabilitation

## 2015-06-04 ENCOUNTER — Encounter: Payer: Medicare Other | Attending: Physical Medicine & Rehabilitation | Admitting: Registered Nurse

## 2015-06-04 VITALS — BP 133/74 | HR 69

## 2015-06-04 DIAGNOSIS — M25512 Pain in left shoulder: Secondary | ICD-10-CM | POA: Insufficient documentation

## 2015-06-04 DIAGNOSIS — M791 Myalgia: Secondary | ICD-10-CM | POA: Insufficient documentation

## 2015-06-04 DIAGNOSIS — Z79899 Other long term (current) drug therapy: Secondary | ICD-10-CM | POA: Diagnosis not present

## 2015-06-04 DIAGNOSIS — M7918 Myalgia, other site: Secondary | ICD-10-CM

## 2015-06-04 DIAGNOSIS — F411 Generalized anxiety disorder: Secondary | ICD-10-CM | POA: Insufficient documentation

## 2015-06-04 DIAGNOSIS — G8929 Other chronic pain: Secondary | ICD-10-CM | POA: Diagnosis not present

## 2015-06-04 DIAGNOSIS — G894 Chronic pain syndrome: Secondary | ICD-10-CM | POA: Diagnosis not present

## 2015-06-04 DIAGNOSIS — M7501 Adhesive capsulitis of right shoulder: Secondary | ICD-10-CM | POA: Insufficient documentation

## 2015-06-04 DIAGNOSIS — M7631 Iliotibial band syndrome, right leg: Secondary | ICD-10-CM | POA: Diagnosis not present

## 2015-06-04 DIAGNOSIS — Z5181 Encounter for therapeutic drug level monitoring: Secondary | ICD-10-CM | POA: Insufficient documentation

## 2015-06-04 DIAGNOSIS — M7502 Adhesive capsulitis of left shoulder: Secondary | ICD-10-CM | POA: Diagnosis not present

## 2015-06-04 DIAGNOSIS — M47812 Spondylosis without myelopathy or radiculopathy, cervical region: Secondary | ICD-10-CM

## 2015-06-04 DIAGNOSIS — M542 Cervicalgia: Secondary | ICD-10-CM | POA: Diagnosis not present

## 2015-06-04 MED ORDER — OXYCODONE-ACETAMINOPHEN 5-325 MG PO TABS: 1.0000 | ORAL_TABLET | Freq: Three times a day (TID) | ORAL | Status: DC | PRN

## 2015-06-04 NOTE — Progress Notes (Signed)
Subjective:    Patient ID: Cassandra [name], female    DOB: 07-11-1956, 59 y.o.   MRN: [id number]  HPI: Cassandra [name] is a 59 year old female who returns for follow up for chronic pain and medication refill. She says her pain is located in her left shoulder. She rates her pain 7. Her current exercise regime is walking. She's living a sedentary lifestyle encouraged to increase her activity and become involved with outside activities. She verbalizes understanding. Also states she's been having constipating and last week she had black tarry stools, she refuses ED evaluation. She states she will call her PCP and Gastroenterologist today encouraged to do so. She verbalizes understanding.  Pain Inventory Average Pain 7 Pain Right Now 7 My pain is sharp, burning, dull, stabbing, tingling and aching  In the last 24 hours, has pain interfered with the following? General activity 8 Relation with others 7 Enjoyment of life 9 What TIME of day is your pain at its worst? morning evening and night Sleep (in general) Poor  Pain is worse with: bending, sitting, inactivity, standing and some activites Pain improves with: rest, heat/ice, pacing activities and medication Relief from Meds: 5  Mobility walk without assistance how many minutes can you walk? ? ability to climb steps?  yes do you drive?  yes needs help with transfers transfers alone  Function not employed: date last employed 2007 disabled: date disabled 2007 I need assistance with the following:  dressing, meal prep, household duties and shopping  Neuro/Psych bladder control problems bowel control problems weakness numbness tremor tingling spasms dizziness confusion depression anxiety  Prior Studies Any changes since last visit?  no  Physicians involved in your care Any changes since last visit?  no   Family History  Problem Relation Age of Onset  . Coronary artery disease Mother   . Diabetes Mother    borderline  . Kidney disease Mother     stage 4  . Coronary artery disease Father   . Prostate cancer Father   . Lung disease Father     black lung  . Liver cancer Maternal Grandmother   . Liver disease Maternal Grandmother   . Colon cancer      Both sides  . Polymyalgia rheumatica Sister   . Coronary artery disease Brother     heart attack  . COPD Brother    Social History   Social History  . Marital Status: Married    Spouse Name: N/A  . Number of Children: 1  . Years of Education: N/A   Occupational History  . Homemaker    Social History Main Topics  . Smoking status: Current Every Day Smoker -- 1.00 packs/day    Types: Cigarettes  . Smokeless tobacco: Never Used     Comment: would like to try chantix, but has not started on it due to warnings related to depression  . Alcohol Use: No  . Drug Use: No  . Sexual Activity: Yes   Other Topics Concern  . None   Social History Narrative   Past Surgical History  Procedure Laterality Date  . Bladder repair      sling  . Shoulder arthroscopy      multiple  . Abdominal hysterectomy      age 47  . Knee arthroscopy Left    Past Medical History  Diagnosis Date  . Nicotine addiction     2 packs per day   . Hyperlipidemia   . Chronic  left shoulder pain     frozen   . Tubular adenoma of colon 01/31/2000    Dr. Richmond Campbell  . Maternal complication related to childbirth     Sepsis, staph  . Skin cancer     Rt. leg, squamous cell  . Anxiety   . Depression   . COPD (chronic obstructive pulmonary disease)     Dr. Redge Gainer   BP 133/74 mmHg  Pulse 69  SpO2 99%  Opioid Risk Score:   Fall Risk Score:  `1  Depression screen PHQ 2/9  Depression screen [facility] 2/9 06/04/2015 03/09/2015 01/13/2015 12/15/2014 10/01/2014  Decreased Interest 2 2 1 2 2   Down, Depressed, Hopeless 1 1 1 2 2   PHQ - 2 Score 3 3 2 4 4   Altered sleeping - 3 1 3 3   Tired, decreased energy - 3 2 3 3   Change in appetite - 3 1 3 3   Feeling bad  or failure about yourself  - 0 1 2 1   Trouble concentrating - 2 1 3 2   Moving slowly or fidgety/restless - 2 1 3 2   Suicidal thoughts - 0 0 0 1  PHQ-9 Score - 16 9 21 19   Difficult doing work/chores - - Somewhat difficult - -     Review of Systems  Constitutional: Positive for diaphoresis, appetite change and unexpected weight change.  HENT:       Meniere's dz  Respiratory: Positive for cough, shortness of breath and wheezing.   Gastrointestinal: Positive for nausea, vomiting, abdominal pain and constipation.       Bowel Control Problems  Genitourinary: Positive for dysuria and difficulty urinating.       Bladder control problems  Musculoskeletal:       Spasms  Neurological: Positive for dizziness, tremors, weakness and numbness.       Tingling  Psychiatric/Behavioral: Positive for confusion and dysphoric mood. The patient is nervous/anxious.        Objective:   Physical Exam  Constitutional: She is oriented to person, place, and time. She appears well-developed and well-nourished.  HENT:  Head: Normocephalic and atraumatic.  Neck: Normal range of motion. Neck supple.  Cardiovascular: Normal rate and regular rhythm.   Pulmonary/Chest: Effort normal and breath sounds normal.  Musculoskeletal:  Normal Muscle Bulk and Muscle Testing Reveals: Upper Extremities: Right: Full ROM and Muscle Strength 5/5 Left: Decreased ROM: Hypersensitivity Thoracic Hypersensitivity: T-1- T-7 Mainly Left Side Lower Extremities: Full ROM and Muscle Strength 5/5 Arises from chair with ease Narrow Based Gait   Neurological: She is alert and oriented to person, place, and time.  Skin: Skin is warm and dry.  Psychiatric: She has a normal mood and affect.  Nursing note and vitals reviewed.         Assessment & Plan:  1. Post-traumatic arthritis left shoulder s/p 2 shoulder surgeries. Now with adhesive capsulitis left shoulder. :  Refilled:oxyCODONE 5/325mg  one tablet every 8 hours as  needed. #70.  2. Cervicalgia, spondylosis: Continue Current Medication Regime.Lidoderm patches, Robaxin, Naprosyn, and Gabapentin 3. Left Iliotibial band syndrome: Continue exercise regime 4. Depression/insomnia/anxiety: Continue Lexapro. Continue Ambien 5 mg HS #30. Dr. Livia Snellen prescribing Xanax.   20 minutes of face to face patient care time was spent during this isit. All questions were encouraged and answered

## 2015-06-05 ENCOUNTER — Telehealth: Payer: Self-pay | Admitting: *Deleted

## 2015-06-05 LAB — PMP ALCOHOL METABOLITE (ETG): Ethyl Glucuronide (EtG): NEGATIVE ng/mL

## 2015-06-05 NOTE — Telephone Encounter (Signed)
FYI call...the patient wants to tell [name] that she has an appt with Dr. Livia Snellen 09/27, she will see him and get the referral to see Dr. Deatra Ina

## 2015-06-07 ENCOUNTER — Other Ambulatory Visit: Payer: Self-pay | Admitting: Physical Medicine & Rehabilitation

## 2015-06-08 NOTE — Progress Notes (Signed)
Subjective:  Patient ID: Cassandra [name], female    DOB: 07-18-1956  Age: 59 y.o. MRN: [id number]  CC: Pain and Insomnia   HPI Cassandra [name] presents for ringing ears, draining wax constantly. Pain left preauricular area. And left scalp. Vertigo causes her to have to go to bed. Vomits. Every 2-3 months.02-3 days. Xanax has relieved.Pacing at night. gETS 3-4 HOURS SLEEP.So confused.Pain awakens her during the night. Pain worst at night. Left shoulder and neck. Radiates to shoulder. Restless leg in left leg better with xanax.Like a rubber band popping in left arm. Painful to touch left shoulder. Toothache like.    History Cassandra [name] has a past medical history of Nicotine addiction; Hyperlipidemia; Chronic left shoulder pain; Tubular adenoma of colon (01/31/2000); Maternal complication related to childbirth; Skin cancer; Anxiety; Depression; and COPD (chronic obstructive pulmonary disease).   She has past surgical history that includes Bladder repair; Shoulder arthroscopy; Abdominal hysterectomy; and Knee arthroscopy (Left).   Her family history includes COPD in her brother; Colon cancer in an other family member; Coronary artery disease in her brother, father, and mother; Diabetes in her mother; Kidney disease in her mother; Liver cancer in her maternal grandmother; Liver disease in her maternal grandmother; Lung disease in her father; Polymyalgia rheumatica in her sister; Prostate cancer in her father.She reports that she has been smoking Cigarettes.  She has been smoking about 1.00 pack per day. She has never used smokeless tobacco. She reports that she does not drink alcohol or use illicit drugs.    ROS Review of Systems  Constitutional: Negative for fever, chills, diaphoresis, appetite change, fatigue and unexpected weight change.  HENT: Negative for congestion, ear pain, hearing loss, postnasal drip, rhinorrhea, sneezing, sore throat and trouble swallowing.   Eyes: Negative for pain.    Respiratory: Negative for cough, chest tightness and shortness of breath.   Cardiovascular: Negative for chest pain and palpitations.  Gastrointestinal: Negative for nausea, vomiting, abdominal pain, diarrhea and constipation.       Little strings of BM with strings. Not emptying. LUQ pain for a long time.  Genitourinary: Negative for dysuria, frequency and menstrual problem.  Musculoskeletal: Negative for joint swelling and arthralgias.  Skin: Negative for rash.  Neurological: Negative for dizziness, weakness, numbness and headaches.  Psychiatric/Behavioral: Negative for dysphoric mood and agitation.    Objective:  BP 143/77 mmHg  Pulse 72  Temp(Src) 97 F (36.1 C) (Oral)  Ht 5\' 6"  (1.676 m)  Wt 148 lb 9.6 oz (67.405 kg)  BMI 24.00 kg/m2  BP Readings from Last 3 Encounters:  06/09/15 143/77  06/04/15 133/74  04/21/15 116/64    Wt Readings from Last 3 Encounters:  06/09/15 148 lb 9.6 oz (67.405 kg)  02/05/15 144 lb (65.318 kg)  01/13/15 147 lb (66.679 kg)     Physical Exam  Constitutional: She is oriented to person, place, and time. She appears well-developed and well-nourished. No distress.  HENT:  Head: Normocephalic and atraumatic.  Right Ear: External ear normal.  Left Ear: External ear normal.  Nose: Nose normal.  Mouth/Throat: Oropharynx is clear and moist.  Eyes: Conjunctivae and EOM are normal. Pupils are equal, round, and reactive to light.  Neck: Normal range of motion. Neck supple. No thyromegaly present.  Cardiovascular: Normal rate, regular rhythm and normal heart sounds.   No murmur heard. Pulmonary/Chest: Effort normal and breath sounds normal. No respiratory distress. She has no wheezes. She has no rales.  Abdominal: Soft. Bowel sounds are normal. She exhibits  no distension. There is no tenderness.  Lymphadenopathy:    She has no cervical adenopathy.  Neurological: She is alert and oriented to person, place, and time. She has normal reflexes.  Skin:  Skin is warm and dry.  Psychiatric: She has a normal mood and affect. Her behavior is normal. Judgment and thought content normal.    Lab Results  Component Value Date   WBC 12.5* 12/10/2013   HGB 14.7 12/10/2013   HCT 45.6 12/10/2013   PLT 317.0 01/22/2013   GLUCOSE 92 02/05/2015   CHOL 157 12/10/2013   TRIG 71 12/10/2013   HDL 64 12/10/2013   LDLCALC 79 12/10/2013   ALT 7 02/05/2015   AST 18 02/05/2015   NA 139 02/05/2015   K 4.3 02/05/2015   CL 98 02/05/2015   CREATININE 0.89 02/05/2015   BUN 11 02/05/2015   CO2 25 02/05/2015   TSH 0.808 12/10/2013    Mr Cervical Spine Wo Contrast  07/09/2014   CLINICAL DATA:  Cervical spondylosis without myelopathy. Left shoulder pain. Left C7 radiculopathy.  EXAM: MRI CERVICAL SPINE WITHOUT CONTRAST  TECHNIQUE: Multiplanar, multisequence MR imaging of the cervical spine was performed. No intravenous contrast was administered.  COMPARISON:  Cervical radiographs 09/20/2013. No prior cervical MRI.  FINDINGS: Normal cervical alignment with straightening of the cervical lordosis. Negative for fracture or mass. No cord compression or cord lesion. Craniocervical junction is normal.  C2-3:  Negative  C3-4:  Negative  C4-5:  Mild facet degeneration on the left.  No significant stenosis  C5-6: Moderate disc degeneration with disc space narrowing and endplate edema related to disc degeneration. There is spondylosis causing mild spinal stenosis and mild foraminal stenosis bilaterally. No disc protrusion identified  C6-7: Disc degeneration and spondylosis causing mild spinal stenosis and mild foraminal stenosis bilaterally.  C7-T1:  Negative  IMPRESSION: Disc degeneration and spondylosis at C5-6 and C6-7 causing mild spinal stenosis and mild foraminal stenosis bilaterally. No focal disc protrusion.   Electronically Signed   By: Franchot Gallo M.D.   On: 07/09/2014 16:58    Assessment & Plan:   Cassandra [name] was seen today for pain and insomnia.  Diagnoses and all  orders for this visit:  Myofascial pain  Secondary osteoarthritis of left shoulder  Cervical spondylosis without myelopathy  Iliotibial band syndrome of right side  Smoker  Encounter for immunization  Slow transit constipation  Chronic LUQ pain  Other orders -     traZODone (DESYREL) 150 MG tablet; 1 or 2 at bedtime for sleep -     ALPRAZolam (XANAX) 1 MG tablet; Take one tablet twice daily for Meniere's Disease -     varenicline (CHANTIX CONTINUING MONTH PAK) 1 MG tablet; Take 1 tablet (1 mg total) by mouth 2 (two) times daily. -     Flu Vaccine QUAD 36+ mos IM   I have discontinued Ms. Girouard's varenicline. I am also having her start on traZODone and varenicline. Additionally, I am having her maintain her conjugated estrogens, Vitamin D, fexofenadine, lidocaine, omeprazole, escitalopram, naproxen, methocarbamol, gabapentin, meclizine, zolpidem, polyethylene glycol, oxyCODONE-acetaminophen, gabapentin, and ALPRAZolam.  Meds ordered this encounter  Medications  . traZODone (DESYREL) 150 MG tablet    Sig: 1 or 2 at bedtime for sleep    Dispense:  60 tablet    Refill:  2  . ALPRAZolam (XANAX) 1 MG tablet    Sig: Take one tablet twice daily for Meniere's Disease    Dispense:  60 tablet    Refill:  5  . varenicline (CHANTIX CONTINUING MONTH PAK) 1 MG tablet    Sig: Take 1 tablet (1 mg total) by mouth 2 (two) times daily.    Dispense:  60 tablet    Refill:  5     Follow-up: Return in about 3 months (around 09/08/2015) for Pain.  [name], M.D.

## 2015-06-09 ENCOUNTER — Encounter: Payer: Self-pay | Admitting: Family Medicine

## 2015-06-09 ENCOUNTER — Ambulatory Visit (INDEPENDENT_AMBULATORY_CARE_PROVIDER_SITE_OTHER): Payer: Medicare Other | Admitting: Family Medicine

## 2015-06-09 VITALS — BP 143/77 | HR 72 | Temp 97.0°F | Ht 66.0 in | Wt 148.6 lb

## 2015-06-09 DIAGNOSIS — Z23 Encounter for immunization: Secondary | ICD-10-CM | POA: Diagnosis not present

## 2015-06-09 DIAGNOSIS — G8929 Other chronic pain: Secondary | ICD-10-CM

## 2015-06-09 DIAGNOSIS — M7918 Myalgia, other site: Secondary | ICD-10-CM

## 2015-06-09 DIAGNOSIS — M7631 Iliotibial band syndrome, right leg: Secondary | ICD-10-CM | POA: Diagnosis not present

## 2015-06-09 DIAGNOSIS — K5901 Slow transit constipation: Secondary | ICD-10-CM

## 2015-06-09 DIAGNOSIS — R1012 Left upper quadrant pain: Secondary | ICD-10-CM | POA: Diagnosis not present

## 2015-06-09 DIAGNOSIS — M791 Myalgia: Secondary | ICD-10-CM | POA: Diagnosis not present

## 2015-06-09 DIAGNOSIS — Z720 Tobacco use: Secondary | ICD-10-CM | POA: Diagnosis not present

## 2015-06-09 DIAGNOSIS — M47812 Spondylosis without myelopathy or radiculopathy, cervical region: Secondary | ICD-10-CM | POA: Diagnosis not present

## 2015-06-09 DIAGNOSIS — M19212 Secondary osteoarthritis, left shoulder: Secondary | ICD-10-CM | POA: Diagnosis not present

## 2015-06-09 DIAGNOSIS — F17200 Nicotine dependence, unspecified, uncomplicated: Secondary | ICD-10-CM

## 2015-06-09 LAB — PRESCRIPTION MONITORING PROFILE (SOLSTAS)
Amphetamine/Meth: NEGATIVE ng/mL
Barbiturate Screen, Urine: NEGATIVE ng/mL
Buprenorphine, Urine: NEGATIVE ng/mL
CARISOPRODOL, URINE: NEGATIVE ng/mL
COCAINE METABOLITES: NEGATIVE ng/mL
CREATININE, URINE: 40.81 mg/dL (ref >20.0)
Cannabinoid Scrn, Ur: NEGATIVE ng/mL
ECSTASY: NEGATIVE ng/mL
Fentanyl, Ur: NEGATIVE ng/mL
METHADONE SCREEN, URINE: NEGATIVE ng/mL
Meperidine, Ur: NEGATIVE ng/mL
NITRITES URINE, INITIAL: NEGATIVE ug/mL
Opiate Screen, Urine: NEGATIVE ng/mL
PH URINE, INITIAL: 5.9 pH (ref 4.5–8.9)
PROPOXYPHENE: NEGATIVE ng/mL
TRAMADOL UR: NEGATIVE ng/mL
Tapentadol, urine: NEGATIVE ng/mL

## 2015-06-09 LAB — OXYCODONE, URINE (LC/MS-MS)
Noroxycodone, Ur: 513 ng/mL (ref <50)
OXYCODONE, UR: 67 ng/mL (ref <50)
OXYMORPHONE, URINE: 110 ng/mL (ref <50)

## 2015-06-09 LAB — BENZODIAZEPINES (GC/LC/MS), URINE
ALPRAZOLAMU: 291 ng/mL (ref <25)
CLONAZEPAU: NEGATIVE ng/mL (ref <25)
FLURAZEPAMU: NEGATIVE ng/mL (ref <50)
Lorazepam (GC/LC/MS), ur confirm: NEGATIVE ng/mL (ref <50)
MIDAZOLAMU: NEGATIVE ng/mL (ref <50)
Nordiazepam (GC/LC/MS), ur confirm: NEGATIVE ng/mL (ref <50)
OXAZEPAMU: NEGATIVE ng/mL (ref <50)
TRIAZOLAMU: NEGATIVE ng/mL (ref <50)
Temazepam (GC/LC/MS), ur confirm: NEGATIVE ng/mL (ref <50)

## 2015-06-09 LAB — ZOLPIDEM (LC/MS-MS), URINE
Zolpidem (GC/LC/MS), Ur confirm: 7 ng/mL (ref <5)
Zolpidem metabolite (GC/LC/MS) Ur, confirm: 219 ng/mL (ref <5)

## 2015-06-09 MED ORDER — TRAZODONE HCL 150 MG PO TABS: ORAL_TABLET | ORAL | Status: DC

## 2015-06-09 MED ORDER — VARENICLINE TARTRATE 1 MG PO TABS: 1.0000 mg | ORAL_TABLET | Freq: Two times a day (BID) | ORAL | Status: DC

## 2015-06-09 MED ORDER — ALPRAZOLAM 1 MG PO TABS: ORAL_TABLET | ORAL | Status: DC

## 2015-06-15 ENCOUNTER — Telehealth: Payer: Self-pay | Admitting: Family Medicine

## 2015-06-17 NOTE — Telephone Encounter (Signed)
Patient states that problem has resolved

## 2015-06-24 NOTE — Progress Notes (Signed)
Urine drug screen for this encounter is consistent for prescribed medication 

## 2015-06-25 ENCOUNTER — Encounter: Payer: Self-pay | Admitting: Gastroenterology

## 2015-07-01 ENCOUNTER — Encounter: Payer: Medicare Other | Admitting: Physical Medicine & Rehabilitation

## 2015-07-02 ENCOUNTER — Encounter: Payer: Medicare Other | Attending: Physical Medicine & Rehabilitation | Admitting: Registered Nurse

## 2015-07-02 ENCOUNTER — Encounter: Payer: Self-pay | Admitting: Registered Nurse

## 2015-07-02 VITALS — BP 128/80 | HR 60 | Resp 14

## 2015-07-02 DIAGNOSIS — Z79899 Other long term (current) drug therapy: Secondary | ICD-10-CM | POA: Diagnosis not present

## 2015-07-02 DIAGNOSIS — M25512 Pain in left shoulder: Secondary | ICD-10-CM | POA: Diagnosis not present

## 2015-07-02 DIAGNOSIS — G8929 Other chronic pain: Secondary | ICD-10-CM | POA: Diagnosis not present

## 2015-07-02 DIAGNOSIS — M542 Cervicalgia: Secondary | ICD-10-CM | POA: Diagnosis not present

## 2015-07-02 DIAGNOSIS — M47812 Spondylosis without myelopathy or radiculopathy, cervical region: Secondary | ICD-10-CM

## 2015-07-02 DIAGNOSIS — M7502 Adhesive capsulitis of left shoulder: Secondary | ICD-10-CM | POA: Diagnosis not present

## 2015-07-02 DIAGNOSIS — G894 Chronic pain syndrome: Secondary | ICD-10-CM

## 2015-07-02 DIAGNOSIS — M791 Myalgia: Secondary | ICD-10-CM

## 2015-07-02 DIAGNOSIS — M7501 Adhesive capsulitis of right shoulder: Secondary | ICD-10-CM | POA: Insufficient documentation

## 2015-07-02 DIAGNOSIS — M7631 Iliotibial band syndrome, right leg: Secondary | ICD-10-CM | POA: Insufficient documentation

## 2015-07-02 DIAGNOSIS — M7918 Myalgia, other site: Secondary | ICD-10-CM

## 2015-07-02 DIAGNOSIS — F411 Generalized anxiety disorder: Secondary | ICD-10-CM | POA: Diagnosis not present

## 2015-07-02 DIAGNOSIS — Z5181 Encounter for therapeutic drug level monitoring: Secondary | ICD-10-CM

## 2015-07-02 MED ORDER — OXYCODONE-ACETAMINOPHEN 5-325 MG PO TABS: 1.0000 | ORAL_TABLET | Freq: Three times a day (TID) | ORAL | Status: DC | PRN

## 2015-07-02 NOTE — Progress Notes (Signed)
Subjective:    Patient ID: Cassandra [name], female    DOB: 1956/01/25, 59 y.o.   MRN: [id number]  HPI: [name] is a 59 year old female who returns for follow up for chronic pain and medication refill. She says her pain is located in her left shoulder. She rates her pain 7. Her current exercise regime is walking. Also states Dr. Livia [name] prescribed Trazodone 150 mg she states her right pupil became dilated and she hasn't taken anymore. Encouraged to follow up with Dr. Livia [name] she verbalizes understanding and stated she has called his office regarding the above.   Pain Inventory Average Pain 7 Pain Right Now 7 My pain is intermittent, sharp, burning, dull, stabbing, tingling and aching  In the last 24 hours, has pain interfered with the following? General activity 6 Relation with others 8 Enjoyment of life 9 What TIME of day is your pain at its worst? morning, evening, night Sleep (in general) Fair  Pain is worse with: bending, sitting, standing and some activites Pain improves with: rest, heat/ice, pacing activities and medication Relief from Meds: 5  Mobility walk without assistance how many minutes can you walk? ? ability to climb steps?  yes do you drive?  yes needs help with transfers  Function not employed: date last employed . disabled: date disabled . I need assistance with the following:  household duties and shopping  Neuro/Psych bladder control problems bowel control problems weakness numbness tremor tingling spasms dizziness confusion depression anxiety  Prior Studies Any changes since last visit?  no bone scan x-rays CT/MRI nerve study  Physicians involved in your care Any changes since last visit?  no   Family History  Problem Relation Age of Onset  . Coronary artery disease Mother   . Diabetes Mother     borderline  . Kidney disease Mother     stage 4  . Coronary artery disease Father   . Prostate cancer Father   . Lung disease  Father     black lung  . Liver cancer Maternal Grandmother   . Liver disease Maternal Grandmother   . Colon cancer      Both sides  . Polymyalgia rheumatica Sister   . Coronary artery disease Brother     heart attack  . COPD Brother    Social History   Social History  . Marital Status: Married    Spouse Name: N/A  . Number of Children: 1  . Years of Education: N/A   Occupational History  . Homemaker    Social History Main Topics  . Smoking status: Current Every Day Smoker -- 1.00 packs/day    Types: Cigarettes  . Smokeless tobacco: Never Used     Comment: would like to try chantix, but has not started on it due to warnings related to depression  . Alcohol Use: No  . Drug Use: No  . Sexual Activity: Yes   Other Topics Concern  . None   Social History Narrative   Past Surgical History  Procedure Laterality Date  . Bladder repair      sling  . Shoulder arthroscopy      multiple  . Abdominal hysterectomy      age 41  . Knee arthroscopy Left    Past Medical History  Diagnosis Date  . Nicotine addiction     2 packs per day   . Hyperlipidemia   . Chronic left shoulder pain     frozen   .  Tubular adenoma of colon 01/31/2000    Dr. Richmond Campbell  . Maternal complication related to childbirth     Sepsis, staph  . Skin cancer     Rt. leg, squamous cell  . Anxiety   . Depression   . COPD (chronic obstructive pulmonary disease) ([place])     Dr. Redge Gainer   BP 128/80 mmHg  Pulse 60  Resp 14  SpO2 99%  Opioid Risk Score:   Fall Risk Score:  `1  Depression screen PHQ 2/9  Depression screen [facility] 2/9 06/04/2015 03/09/2015 01/13/2015 12/15/2014 10/01/2014  Decreased Interest 2 2 1 2 2   Down, Depressed, Hopeless 1 1 1 2 2   PHQ - 2 Score 3 3 2 4 4   Altered sleeping - 3 1 3 3   Tired, decreased energy - 3 2 3 3   Change in appetite - 3 1 3 3   Feeling bad or failure about yourself  - 0 1 2 1   Trouble concentrating - 2 1 3 2   Moving slowly or fidgety/restless - 2 1 3  2   Suicidal thoughts - 0 0 0 1  PHQ-9 Score - 16 9 21 19   Difficult doing work/chores - - Somewhat difficult - -     Review of Systems  Constitutional: Positive for diaphoresis, appetite change and unexpected weight change.  Gastrointestinal: Positive for nausea, abdominal pain, diarrhea and constipation.  Genitourinary:       Incontinence retention  Skin: Positive for rash.  Neurological: Positive for dizziness, tremors, weakness and numbness.       Tingling spasms  Psychiatric/Behavioral: Positive for confusion and dysphoric mood. The patient is nervous/anxious.   All other systems reviewed and are negative.      Objective:   Physical Exam  Constitutional: She is oriented to person, place, and time. She appears well-developed and well-nourished.  HENT:  Head: Normocephalic and atraumatic.  Neck: Normal range of motion. Neck supple.  Cardiovascular: Normal rate and regular rhythm.   Pulmonary/Chest: Effort normal and breath sounds normal.  Musculoskeletal:  Normal Muscle Bulk and Muscle Testing Reveals: Upper Extremities: Right: Full ROM and Muscle Strength 5/5 Left: Decreased ROM 45 Degrees and Muscle Strength 4/5 Thoracic Paraspinal Tenderness: T-1- T-4 Lower Extremities: Full ROM and Muscle Strength 5/5 Arises from chair with ease Narrow Based Gait   Neurological: She is alert and oriented to person, place, and time.  Skin: Skin is warm and dry.  Psychiatric: She has a normal mood and affect.  Nursing note and vitals reviewed.         Assessment & Plan:  1. Post-traumatic arthritis left shoulder s/p 2 shoulder surgeries. Now with adhesive capsulitis left shoulder. :  Refilled:oxyCODONE 5/325mg  one tablet every 8 hours as needed. #70.  2. Cervicalgia, spondylosis: Continue Current Medication Regime.Lidoderm patches, Robaxin, Naprosyn, and Gabapentin 3. Left Iliotibial band syndrome: Continue exercise regime 4. Depression/insomnia/anxiety: Continue Lexapro.  Continue Ambien 5 mg HS #30. Dr. Livia [name] prescribing Xanax.   20 minutes of face to face patient care time was spent during this isit. All questions were encouraged and answered

## 2015-07-05 ENCOUNTER — Other Ambulatory Visit: Payer: Self-pay | Admitting: Family Medicine

## 2015-07-06 ENCOUNTER — Other Ambulatory Visit: Payer: Self-pay | Admitting: Physical Medicine & Rehabilitation

## 2015-07-25 ENCOUNTER — Ambulatory Visit (INDEPENDENT_AMBULATORY_CARE_PROVIDER_SITE_OTHER): Payer: Medicare Other | Admitting: Family

## 2015-07-25 ENCOUNTER — Encounter: Payer: Self-pay | Admitting: Family

## 2015-07-25 VITALS — BP 128/85 | HR 77 | Temp 97.4°F | Ht 66.0 in | Wt 150.0 lb

## 2015-07-25 DIAGNOSIS — L251 Unspecified contact dermatitis due to drugs in contact with skin: Secondary | ICD-10-CM | POA: Diagnosis not present

## 2015-07-25 MED ORDER — TRIAMCINOLONE ACETONIDE 0.5 % EX OINT: 1.0000 "application " | TOPICAL_OINTMENT | Freq: Two times a day (BID) | CUTANEOUS | Status: DC

## 2015-07-25 NOTE — Progress Notes (Signed)
   Subjective:    Patient ID: Cassandra [name], female    DOB: October 31, 1955, 59 y.o.   MRN: [id number]  Pt presents to the office today for a rash on her chest and right arm that she states that started a few weeks ago. Pt reports starting Chantix about a month ago. Pt states she has started feeling SOB at times. Rash This is a new problem. The current episode started 1 to 4 weeks ago. The problem has been waxing and waning since onset. The affected locations include the right arm, torso and chest. The rash is characterized by itchiness and redness. She was exposed to a new medication (Pt started Chantix one month ago). Associated symptoms include shortness of breath ("at times"). Pertinent negatives include no congestion, cough, diarrhea or joint pain. Past treatments include nothing. The treatment provided no relief. Her past medical history is significant for allergies. There is no history of asthma.  Shortness of Breath Associated symptoms include a rash. Pertinent negatives include no headaches. Her past medical history is significant for allergies. There is no history of asthma.      Review of Systems  Constitutional: Negative.   HENT: Negative.  Negative for congestion.   Eyes: Negative.   Respiratory: Positive for shortness of breath ("at times"). Negative for cough.   Cardiovascular: Negative.  Negative for palpitations.  Gastrointestinal: Negative.  Negative for diarrhea.  Endocrine: Negative.   Genitourinary: Negative.   Musculoskeletal: Negative.  Negative for joint pain.  Skin: Positive for rash.  Neurological: Negative.  Negative for headaches.  Hematological: Negative.   Psychiatric/Behavioral: Negative.   All other systems reviewed and are negative.      Objective:   Physical Exam  Constitutional: She is oriented to person, place, and time. She appears well-developed and well-nourished. No distress.  HENT:  Head: Normocephalic and atraumatic.  Right Ear: External ear  normal.  Left Ear: External ear normal.  Mouth/Throat: Oropharynx is clear and moist.  Eyes: Pupils are equal, round, and reactive to light.  Neck: Normal range of motion. Neck supple. No thyromegaly present.  Cardiovascular: Normal rate, regular rhythm, normal heart sounds and intact distal pulses.   No murmur heard. Pulmonary/Chest: Effort normal and breath sounds normal. No respiratory distress. She has no wheezes.  Abdominal: Soft. Bowel sounds are normal. She exhibits no distension. There is no tenderness.  Musculoskeletal: Normal range of motion. She exhibits no edema or tenderness.  Neurological: She is alert and oriented to person, place, and time. She has normal reflexes. No cranial nerve deficit.  Skin: Skin is warm and dry. There is erythema.  One small pustula on right forearm, scattered erythemas dermatitis on chest and under left breast    Psychiatric: She has a normal mood and affect. Her behavior is normal. Judgment and thought content normal.  Vitals reviewed.   BP 128/85 mmHg  Pulse 77  Temp(Src) 97.4 F (36.3 C) (Oral)  Ht 5\' 6"  (1.676 m)  Wt 150 lb (68.04 kg)  BMI 24.22 kg/m2       Assessment & Plan:  1. Dermatitis, contact, from medications/drugs -Pt to stop Chantix -Do not scratch or pick at rash -Pt does not appear to be in any distress or SOB- I believe SOB is related to anxiety -RTO prn - triamcinolone ointment (KENALOG) 0.5 %; Apply 1 application topically 2 (two) times daily.  Dispense: 30 g; Refill: 0  [name], FNP

## 2015-07-25 NOTE — Patient Instructions (Signed)
Contact Dermatitis Dermatitis is redness, soreness, and swelling (inflammation) of the skin. Contact dermatitis is a reaction to certain substances that touch the skin. There are two types of contact dermatitis:   Irritant contact dermatitis. This type is caused by something that irritates your skin, such as dry hands from washing them too much. This type does not require previous exposure to the substance for a reaction to occur. This type is more common.  Allergic contact dermatitis. This type is caused by a substance that you are allergic to, such as a nickel allergy or poison ivy. This type only occurs if you have been exposed to the substance (allergen) before. Upon a repeat exposure, your body reacts to the substance. This type is less common. CAUSES  Many different substances can cause contact dermatitis. Irritant contact dermatitis is most commonly caused by exposure to:   Makeup.   Soaps.   Detergents.   Bleaches.   Acids.   Metal salts, such as nickel.  Allergic contact dermatitis is most commonly caused by exposure to:   Poisonous plants.   Chemicals.   Jewelry.   Latex.   Medicines.   Preservatives in products, such as clothing.  RISK FACTORS This condition is more likely to develop in:   People who have jobs that expose them to irritants or allergens.  People who have certain medical conditions, such as asthma or eczema.  SYMPTOMS  Symptoms of this condition may occur anywhere on your body where the irritant has touched you or is touched by you. Symptoms include:  Dryness or flaking.   Redness.   Cracks.   Itching.   Pain or a burning feeling.   Blisters.  Drainage of small amounts of blood or clear fluid from skin cracks. With allergic contact dermatitis, there may also be swelling in areas such as the eyelids, mouth, or genitals.  DIAGNOSIS  This condition is diagnosed with a medical history and physical exam. A patch skin test  may be performed to help determine the cause. If the condition is related to your job, you may need to see an occupational medicine specialist. TREATMENT Treatment for this condition includes figuring out what caused the reaction and protecting your skin from further contact. Treatment may also include:   Steroid creams or ointments. Oral steroid medicines may be needed in more severe cases.  Antibiotics or antibacterial ointments, if a skin infection is present.  Antihistamine lotion or an antihistamine taken by mouth to ease itching.  A bandage (dressing). HOME CARE INSTRUCTIONS Skin Care  Moisturize your skin as needed.   Apply cool compresses to the affected areas.  Try taking a bath with:  Epsom salts. Follow the instructions on the packaging. You can get these at your local pharmacy or grocery store.  Baking soda. Pour a small amount into the bath as directed by your health care provider.  Colloidal oatmeal. Follow the instructions on the packaging. You can get this at your local pharmacy or grocery store.  Try applying baking soda paste to your skin. Stir water into baking soda until it reaches a paste-like consistency.  Do not scratch your skin.  Bathe less frequently, such as every other day.  Bathe in lukewarm water. Avoid using hot water. Medicines  Take or apply over-the-counter and prescription medicines only as told by your health care provider.   If you were prescribed an antibiotic medicine, take or apply your antibiotic as told by your health care provider. Do not stop using the   antibiotic even if your condition starts to improve. General Instructions  Keep all follow-up visits as told by your health care provider. This is important.  Avoid the substance that caused your reaction. If you do not know what caused it, keep a journal to try to track what caused it. Write down:  What you eat.  What cosmetic products you use.  What you drink.  What  you wear in the affected area. This includes jewelry.  If you were given a dressing, take care of it as told by your health care provider. This includes when to change and remove it. SEEK MEDICAL CARE IF:   Your condition does not improve with treatment.  Your condition gets worse.  You have signs of infection such as swelling, tenderness, redness, soreness, or warmth in the affected area.  You have a fever.  You have new symptoms. SEEK IMMEDIATE MEDICAL CARE IF:   You have a severe headache, neck pain, or neck stiffness.  You vomit.  You feel very sleepy.  You notice red streaks coming from the affected area.  Your bone or joint underneath the affected area becomes painful after the skin has healed.  The affected area turns darker.  You have difficulty breathing.   This information is not intended to replace advice given to you by your health care provider. Make sure you discuss any questions you have with your health care provider.   Document Released: 08/26/2000 Document Revised: 05/20/2015 Document Reviewed: 01/14/2015 Elsevier Interactive Patient Education 2016 [employer]  

## 2015-07-28 ENCOUNTER — Ambulatory Visit (AMBULATORY_SURGERY_CENTER): Payer: Self-pay

## 2015-07-28 VITALS — Ht 66.0 in | Wt 150.0 lb

## 2015-07-28 DIAGNOSIS — R194 Change in bowel habit: Secondary | ICD-10-CM

## 2015-07-28 DIAGNOSIS — Z86010 Personal history of colonic polyps: Secondary | ICD-10-CM

## 2015-07-28 NOTE — Progress Notes (Signed)
Per pt, no allergies to soy or egg products.Pt not taking any weight loss meds or using  O2 at home. 

## 2015-07-30 ENCOUNTER — Encounter: Payer: Self-pay | Admitting: Gastroenterology

## 2015-08-04 ENCOUNTER — Encounter: Payer: Self-pay | Admitting: Registered Nurse

## 2015-08-04 ENCOUNTER — Encounter: Payer: Medicare Other | Attending: Physical Medicine & Rehabilitation | Admitting: Registered Nurse

## 2015-08-04 VITALS — BP 124/91 | HR 78

## 2015-08-04 DIAGNOSIS — M791 Myalgia: Secondary | ICD-10-CM | POA: Diagnosis not present

## 2015-08-04 DIAGNOSIS — M542 Cervicalgia: Secondary | ICD-10-CM | POA: Insufficient documentation

## 2015-08-04 DIAGNOSIS — M7918 Myalgia, other site: Secondary | ICD-10-CM

## 2015-08-04 DIAGNOSIS — G8929 Other chronic pain: Secondary | ICD-10-CM | POA: Diagnosis not present

## 2015-08-04 DIAGNOSIS — M47812 Spondylosis without myelopathy or radiculopathy, cervical region: Secondary | ICD-10-CM

## 2015-08-04 DIAGNOSIS — F411 Generalized anxiety disorder: Secondary | ICD-10-CM | POA: Insufficient documentation

## 2015-08-04 DIAGNOSIS — G894 Chronic pain syndrome: Secondary | ICD-10-CM

## 2015-08-04 DIAGNOSIS — M25512 Pain in left shoulder: Secondary | ICD-10-CM | POA: Diagnosis not present

## 2015-08-04 DIAGNOSIS — M7502 Adhesive capsulitis of left shoulder: Secondary | ICD-10-CM

## 2015-08-04 DIAGNOSIS — Z79899 Other long term (current) drug therapy: Secondary | ICD-10-CM | POA: Diagnosis not present

## 2015-08-04 DIAGNOSIS — M7631 Iliotibial band syndrome, right leg: Secondary | ICD-10-CM | POA: Diagnosis not present

## 2015-08-04 DIAGNOSIS — Z5181 Encounter for therapeutic drug level monitoring: Secondary | ICD-10-CM | POA: Insufficient documentation

## 2015-08-04 DIAGNOSIS — M7501 Adhesive capsulitis of right shoulder: Secondary | ICD-10-CM | POA: Insufficient documentation

## 2015-08-04 MED ORDER — OXYCODONE-ACETAMINOPHEN 5-325 MG PO TABS: 1.0000 | ORAL_TABLET | Freq: Three times a day (TID) | ORAL | Status: DC | PRN

## 2015-08-04 NOTE — Progress Notes (Signed)
Subjective:    Patient ID: Cassandra [name], female    DOB: Jan 26, 1956, 59 y.o.   MRN: [id number]  HPI: [name] is a 59 year old female who returns for follow up for chronic pain and medication refill. She says her pain is located in her neck,left shoulder, left arm and mid back left side. She did not rate her pain today. Her current exercise regime is walking.   Pain Inventory Average Pain 7 Pain Right Now NA My pain is intermittent, constant, sharp, burning, dull, stabbing, tingling and aching  In the last 24 hours, has pain interfered with the following? General activity 7 Relation with others 9 Enjoyment of life 8 What TIME of day is your pain at its worst? Morning and Night Sleep (in general) Poor  Pain is worse with: bending, sitting, standing and some activites Pain improves with: rest, heat/ice, pacing activities and medication Relief from Meds: 5  Mobility walk without assistance ability to climb steps?  yes do you drive?  yes needs help with transfers  Function not employed: date last employed 2007 disabled: date disabled 2007 I need assistance with the following:  dressing, meal prep, household duties and shopping  Neuro/Psych bowel control problems weakness numbness tremor tingling spasms dizziness confusion depression anxiety  Prior Studies Any changes since last visit?  no  Physicians involved in your care Any changes since last visit?  no   Family History  Problem Relation Age of Onset  . Coronary artery disease Mother   . Diabetes Mother     borderline  . Kidney disease Mother     stage 4  . Coronary artery disease Father   . Prostate cancer Father   . Lung disease Father     black lung  . Liver cancer Maternal Grandmother   . Liver disease Maternal Grandmother   . Colon cancer      Both sides  . Polymyalgia rheumatica Sister   . Heart disease Sister   . Coronary artery disease Brother     heart attack  . COPD Brother     . Colon polyps Sister   . Lung cancer Paternal Grandmother    Social History   Social History  . Marital Status: Married    Spouse Name: N/A  . Number of Children: 1  . Years of Education: N/A   Occupational History  . Homemaker    Social History Main Topics  . Smoking status: Current Every Day Smoker -- 1.00 packs/day    Types: Cigarettes  . Smokeless tobacco: Never Used     Comment: would like to try chantix, but has not started on it due to warnings related to depression  . Alcohol Use: No  . Drug Use: No  . Sexual Activity: Yes   Other Topics Concern  . None   Social History Narrative   Past Surgical History  Procedure Laterality Date  . Bladder repair      sling/with perforation  . Shoulder arthroscopy      multiple  . Abdominal hysterectomy      age 52  . Knee arthroscopy Left   . Cesarean section      1 time  . Ruptured uterus     Past Medical History  Diagnosis Date  . Nicotine addiction     2 packs per day   . Hyperlipidemia   . Chronic left shoulder pain     frozen   . Tubular adenoma of colon 01/31/2000  Dr. Richmond Campbell  . Maternal complication related to childbirth     Sepsis, staph  . Skin cancer     Rt. leg, squamous cell  . Anxiety   . Depression   . COPD (chronic obstructive pulmonary disease) ([place])     Dr. Redge Gainer  . Bowel habit changes     excessive gas /frequent BM's with change in shape with pus  . Meniere's disease   . Chronic pain     left upper shoulder ,neck and back  . Rapid or irregular heartbeat    BP 124/91 mmHg  Pulse 78  SpO2 98%  Opioid Risk Score:   Fall Risk Score:  `1  Depression screen PHQ 2/9  Depression screen [facility] 2/9 06/04/2015 03/09/2015 01/13/2015 12/15/2014 10/01/2014  Decreased Interest 2 2 1 2 2   Down, Depressed, Hopeless 1 1 1 2 2   PHQ - 2 Score 3 3 2 4 4   Altered sleeping - 3 1 3 3   Tired, decreased energy - 3 2 3 3   Change in appetite - 3 1 3 3   Feeling bad or failure about yourself  - 0  1 2 1   Trouble concentrating - 2 1 3 2   Moving slowly or fidgety/restless - 2 1 3 2   Suicidal thoughts - 0 0 0 1  PHQ-9 Score - 16 9 21 19   Difficult doing work/chores - - Somewhat difficult - -     Review of Systems  Constitutional: Positive for diaphoresis and appetite change.  Respiratory: Positive for wheezing.   Gastrointestinal: Positive for nausea, vomiting, abdominal pain and constipation.       Bowel Control Problems  Genitourinary:       Urine Retention  Musculoskeletal:       Spasms  Neurological: Positive for dizziness, tremors, weakness and numbness.       Tingling  Psychiatric/Behavioral: Positive for confusion. The patient is nervous/anxious.        Depression  All other systems reviewed and are negative.      Objective:   Physical Exam  Constitutional: She is oriented to person, place, and time. She appears well-developed and well-nourished.  HENT:  Head: Normocephalic and atraumatic.  Neck: Normal range of motion. Neck supple.  Cardiovascular: Normal rate and regular rhythm.   Pulmonary/Chest: Effort normal and breath sounds normal.  Musculoskeletal:  Normal Muscle Bulk and Muscle Testing Reveals: Upper Extremities: Right: Full ROM and Muscle Strength 5/5 Left: Decreased ROM 45 Degrees and Muscle Strength 3/5 Thoracic Hypersensitivity: T-1- T-6 Mainly Left Side Lower Extremities: Full ROM and Muscle Strength 5/5 Arises from chair with ease Narrow Based gait  Neurological: She is alert and oriented to person, place, and time.  Skin: Skin is warm and dry.  Psychiatric: She has a normal mood and affect.  Nursing note and vitals reviewed.         Assessment & Plan:  1. Post-traumatic arthritis left shoulder s/p 2 shoulder surgeries. Now with adhesive capsulitis left shoulder. :  Refilled:oxyCODONE 5/325mg  one tablet every 8 hours as needed. #70.  2. Cervicalgia, spondylosis: Continue Current Medication Regime.Lidoderm patches, Robaxin, Naprosyn,  and Gabapentin 3. Left Iliotibial band syndrome: Continue exercise regime 4. Depression/insomnia/anxiety: Continue Lexapro. Continue Ambien 5 mg HS #30. Dr. Livia Snellen prescribing Xanax.   20 minutes of face to face patient care time was spent during this isit. All questions were encouraged and answered

## 2015-08-05 ENCOUNTER — Encounter: Payer: Self-pay | Admitting: Gastroenterology

## 2015-08-05 ENCOUNTER — Ambulatory Visit (AMBULATORY_SURGERY_CENTER): Payer: Medicare Other | Admitting: Gastroenterology

## 2015-08-05 VITALS — BP 122/54 | HR 60 | Temp 96.8°F | Resp 18 | Ht 66.0 in | Wt 150.0 lb

## 2015-08-05 DIAGNOSIS — K621 Rectal polyp: Secondary | ICD-10-CM

## 2015-08-05 DIAGNOSIS — K635 Polyp of colon: Secondary | ICD-10-CM

## 2015-08-05 DIAGNOSIS — D123 Benign neoplasm of transverse colon: Secondary | ICD-10-CM

## 2015-08-05 DIAGNOSIS — Z86010 Personal history of colonic polyps: Secondary | ICD-10-CM

## 2015-08-05 DIAGNOSIS — Z1211 Encounter for screening for malignant neoplasm of colon: Secondary | ICD-10-CM | POA: Diagnosis not present

## 2015-08-05 DIAGNOSIS — R194 Change in bowel habit: Secondary | ICD-10-CM

## 2015-08-05 DIAGNOSIS — D128 Benign neoplasm of rectum: Secondary | ICD-10-CM

## 2015-08-05 MED ORDER — SODIUM CHLORIDE 0.9 % IV SOLN: 500.0000 mL | INTRAVENOUS | Status: DC

## 2015-08-05 NOTE — Progress Notes (Signed)
Patient denies any allergies to eggs or soy. 

## 2015-08-05 NOTE — Progress Notes (Signed)
Called to room to assist during endoscopic procedure.  Patient ID and intended procedure confirmed with present staff. Received instructions for my participation in the procedure from the performing physician.  

## 2015-08-05 NOTE — Op Note (Signed)
[place]  [address] [place], [id number]   COLONOSCOPY PROCEDURE REPORT  PATIENT: Cassandra [name], Cassandra [name]  MR#: [id number] BIRTHDATE: [birth year] , 24  yrs. old GENDER: female ENDOSCOPIST: Harl Bowie, MD REFERRED [id number], Cletus Gash, MD PROCEDURE DATE:  08/05/2015 PROCEDURE:   Colonoscopy with cold biopsy polypectomy, Colonoscopy with snare polypectomy, and Colonoscopy, surveillance First Screening Colonoscopy - Avg.  risk and is 50 yrs.  old or older - No.  Prior Negative Screening - Now for repeat screening. N/A  History of Adenoma - Now for follow-up colonoscopy & has been > or = to 3 yrs.  Yes hx of adenoma.  Has been 3 or more years since last colonoscopy.  Polyps removed today? Yes ASA CLASS:   Class II INDICATIONS:Surveillance due to prior colonic neoplasia and PH Colon Adenoma. MEDICATIONS: Propofol 250 mg IV  DESCRIPTION OF PROCEDURE:   After the risks benefits and alternatives of the procedure were thoroughly explained, informed consent was obtained.  The digital rectal exam revealed no abnormalities of the rectum.   The LB [id number]  endoscope was introduced through the anus and advanced to the cecum, which was identified by both the appendix and ileocecal valve. No adverse events experienced.   The quality of the prep was good.  The instrument was then slowly withdrawn as the colon was fully examined. Estimated blood loss is zero unless otherwise noted in this procedure report.   COLON FINDINGS: Two sessile polyps ranging between 3-52mm in size were found in the rectum.  A polypectomy was performed with cold forceps. 6-7 mm polyp in transverse colon, polypectomy was performed with a cold snare. specimen retrieved. Retroflexed views revealed no abnormalities. The time to cecum = 2.42 Withdrawal time = 14.2        The scope was withdrawn and the procedure completed. COMPLICATIONS: There were no immediate complications.  ENDOSCOPIC  IMPRESSION: Two sessile polyps ranging between 3-66mm in size were found in the rectum; polypectomy was performed with cold forceps; 6-7 mm polyp in transverse colon, polypectomy was performed with a cold snare.  RECOMMENDATIONS: 1.  If the polyp(s) removed today are proven to be adenomatous (pre-cancerous) polyps, you will need a colonoscopy in 3 years. Otherwise you should continue to follow colorectal cancer screening guidelines for "routine risk" patients with a colonoscopy in 10 years.  You will receive a letter within 1-2 weeks with the results of your biopsy as well as final recommendations.  Please call my office if you have not received a letter after 3 weeks. 2.  Await pathology results  eSigned:  Harl Bowie, MD 08/05/2015 11:44 AM

## 2015-08-05 NOTE — Progress Notes (Signed)
A/ox3 pleased with MAC, report to Wendy RN 

## 2015-08-05 NOTE — Patient Instructions (Signed)
YOU HAD AN ENDOSCOPIC PROCEDURE TODAY AT [facility]:   Refer to the procedure report that was given to you for any specific questions about what was found during the examination.  If the procedure report does not answer your questions, please call your gastroenterologist to clarify.  If you requested that your care partner not be given the details of your procedure findings, then the procedure report has been included in a sealed envelope for you to review at your convenience later.  YOU SHOULD EXPECT: Some feelings of bloating in the abdomen. Passage of more gas than usual.  Walking can help get rid of the air that was put into your GI tract during the procedure and reduce the bloating. If you had a lower endoscopy (such as a colonoscopy or flexible sigmoidoscopy) you may notice spotting of blood in your stool or on the toilet paper. If you underwent a bowel prep for your procedure, you may not have a normal bowel movement for a few days.  Please Note:  You might notice some irritation and congestion in your nose or some drainage.  This is from the oxygen used during your procedure.  There is no need for concern and it should clear up in a day or so.  SYMPTOMS TO REPORT IMMEDIATELY:   Following lower endoscopy (colonoscopy or flexible sigmoidoscopy):  Excessive amounts of blood in the stool  Significant tenderness or worsening of abdominal pains  Swelling of the abdomen that is new, acute  Fever of 100F or higher  For urgent or emergent issues, a gastroenterologist can be reached at any hour by calling [phone].   DIET: Your first meal following the procedure should be a small meal and then it is ok to progress to your normal diet. Heavy or fried foods are harder to digest and may make you feel nauseous or bloated.  Likewise, meals heavy in dairy and vegetables can increase bloating.  Drink plenty of fluids but you should avoid alcoholic beverages for 24  hours.  ACTIVITY:  You should plan to take it easy for the rest of today and you should NOT DRIVE or use heavy machinery until tomorrow (because of the sedation medicines used during the test).    FOLLOW UP: Our staff will call the number listed on your records the next business day following your procedure to check on you and address any questions or concerns that you may have regarding the information given to you following your procedure. If we do not reach you, we will leave a message.  However, if you are feeling well and you are not experiencing any problems, there is no need to return our call.  We will assume that you have returned to your regular daily activities without incident.  If any biopsies were taken you will be contacted by phone or by letter within the next 1-3 weeks.  Please call us at [phone] if you have not heard about the biopsies in 3 weeks.    SIGNATURES/CONFIDENTIALITY: You and/or your care partner have signed paperwork which will be entered into your electronic medical record.  These signatures attest to the fact that that the information above on your After Visit Summary has been reviewed and is understood.  Full responsibility of the confidentiality of this discharge information lies with you and/or your care-partner.  Next colonoscopy determined by pathology results; 3 or 10 years. Please review polyp handout provided.

## 2015-08-07 ENCOUNTER — Other Ambulatory Visit: Payer: Self-pay | Admitting: Physical Medicine & Rehabilitation

## 2015-08-09 ENCOUNTER — Other Ambulatory Visit: Payer: Self-pay | Admitting: Physical Medicine & Rehabilitation

## 2015-08-10 ENCOUNTER — Telehealth: Payer: Self-pay | Admitting: *Deleted

## 2015-08-10 NOTE — Telephone Encounter (Signed)
  Follow up Call-  Call back number 08/05/2015 01/22/2013  Post procedure Call Back phone  # [phone]  Permission to leave phone message Yes Yes     Patient questions:  Do you have a fever, pain , or abdominal swelling? No. Pain Score  0 *  Have you tolerated food without any problems? Yes.    Have you been able to return to your normal activities? Yes.    Do you have any questions about your discharge instructions: Diet   No. Medications  No. Follow up visit  No.  Do you have questions or concerns about your Care? No.  Actions: * If pain score is 4 or above: No action needed, pain <4.

## 2015-08-25 ENCOUNTER — Encounter: Payer: Self-pay | Admitting: Gastroenterology

## 2015-08-28 ENCOUNTER — Telehealth: Payer: Self-pay | Admitting: Gastroenterology

## 2015-08-28 NOTE — Telephone Encounter (Signed)
Explained the pathology report. The patient states she has not had any bowel issues since her colonoscopy. She was curious to know if the polyps could have caused the constipation.

## 2015-09-01 ENCOUNTER — Encounter: Payer: Medicare Other | Attending: Physical Medicine & Rehabilitation | Admitting: Physical Medicine & Rehabilitation

## 2015-09-01 ENCOUNTER — Encounter: Payer: Self-pay | Admitting: Physical Medicine & Rehabilitation

## 2015-09-01 VITALS — BP 141/66 | HR 103 | Resp 14

## 2015-09-01 DIAGNOSIS — M791 Myalgia: Secondary | ICD-10-CM | POA: Insufficient documentation

## 2015-09-01 DIAGNOSIS — G8929 Other chronic pain: Secondary | ICD-10-CM | POA: Insufficient documentation

## 2015-09-01 DIAGNOSIS — M47812 Spondylosis without myelopathy or radiculopathy, cervical region: Secondary | ICD-10-CM | POA: Diagnosis not present

## 2015-09-01 DIAGNOSIS — M19012 Primary osteoarthritis, left shoulder: Secondary | ICD-10-CM | POA: Diagnosis not present

## 2015-09-01 DIAGNOSIS — M25512 Pain in left shoulder: Secondary | ICD-10-CM | POA: Insufficient documentation

## 2015-09-01 DIAGNOSIS — M542 Cervicalgia: Secondary | ICD-10-CM

## 2015-09-01 DIAGNOSIS — F411 Generalized anxiety disorder: Secondary | ICD-10-CM | POA: Diagnosis not present

## 2015-09-01 DIAGNOSIS — M7631 Iliotibial band syndrome, right leg: Secondary | ICD-10-CM | POA: Diagnosis not present

## 2015-09-01 DIAGNOSIS — M7502 Adhesive capsulitis of left shoulder: Secondary | ICD-10-CM | POA: Diagnosis not present

## 2015-09-01 DIAGNOSIS — Z79899 Other long term (current) drug therapy: Secondary | ICD-10-CM | POA: Insufficient documentation

## 2015-09-01 DIAGNOSIS — M7918 Myalgia, other site: Secondary | ICD-10-CM

## 2015-09-01 DIAGNOSIS — Z5181 Encounter for therapeutic drug level monitoring: Secondary | ICD-10-CM | POA: Diagnosis not present

## 2015-09-01 DIAGNOSIS — G894 Chronic pain syndrome: Secondary | ICD-10-CM | POA: Diagnosis not present

## 2015-09-01 DIAGNOSIS — M7501 Adhesive capsulitis of right shoulder: Secondary | ICD-10-CM | POA: Insufficient documentation

## 2015-09-01 MED ORDER — OXYCODONE-ACETAMINOPHEN 5-325 MG PO TABS: 1.0000 | ORAL_TABLET | Freq: Three times a day (TID) | ORAL | Status: DC | PRN

## 2015-09-01 NOTE — Progress Notes (Signed)
Subjective:    Patient ID: Cassandra [name], female    DOB: July 24, 1956, 59 y.o.   MRN: [id number]  HPI   Cassandra [name] is here in follow up of her chronic pain. She is still struggling with daily pain. She continues on percocet for pain control. She takes 2-3 per day on avg. She uses her gabapentin, does her exercises, utilizes heat, etc. She used to have a TENS unit sometime ago but it apparently broke.  Her mood has improved with the lexapro. Her use of xanax has become rather limited.  She stopped smoking 3 months ago and she's very proud of that.    Pain Inventory Average Pain 8 Pain Right Now NA My pain is constant, sharp, burning, dull, stabbing, tingling and aching  In the last 24 hours, has pain interfered with the following? General activity 7 Relation with others 8 Enjoyment of life 8 What TIME of day is your pain at its worst? morning, night Sleep (in general) Poor  Pain is worse with: bending, sitting, inactivity, standing and some activites Pain improves with: heat/ice, pacing activities and medication Relief from Meds: 6  Mobility walk without assistance ability to climb steps?  yes do you drive?  yes needs help with transfers  Function not employed: date last employed 2007 disabled: date disabled 2007 I need assistance with the following:  dressing, meal prep, household duties and shopping  Neuro/Psych bladder control problems bowel control problems weakness numbness tremor tingling trouble walking spasms dizziness confusion anxiety loss of taste or smell  Prior Studies Any changes since last visit?  no bone scan x-rays CT/MRI nerve study  Physicians involved in your care Primary care Dr. Livia Snellen   Family History  Problem Relation Age of Onset  . Coronary artery disease Mother   . Diabetes Mother     borderline  . Kidney disease Mother     stage 4  . Coronary artery disease Father   . Prostate cancer Father   . Lung disease Father    black lung  . Liver cancer Maternal Grandmother   . Liver disease Maternal Grandmother   . Colon cancer      Both sides  . Polymyalgia rheumatica Sister   . Heart disease Sister   . Coronary artery disease Brother     heart attack  . COPD Brother   . Colon polyps Sister   . Lung cancer Paternal Grandmother    Social History   Social History  . Marital Status: Married    Spouse Name: N/A  . Number of Children: 1  . Years of Education: N/A   Occupational History  . Homemaker    Social History Main Topics  . Smoking status: Former Smoker -- 1.00 packs/day    Types: Cigarettes    Quit date: 06/01/2015  . Smokeless tobacco: Never Used     Comment: would like to try chantix, but has not started on it due to warnings related to depression  . Alcohol Use: No  . Drug Use: No  . Sexual Activity: Yes   Other Topics Concern  . None   Social History Narrative   Past Surgical History  Procedure Laterality Date  . Bladder repair      sling/with perforation  . Shoulder arthroscopy      multiple  . Abdominal hysterectomy      age 3  . Knee arthroscopy Left   . Cesarean section      1 time  . Ruptured uterus  Past Medical History  Diagnosis Date  . Nicotine addiction     2 packs per day   . Hyperlipidemia   . Chronic left shoulder pain     frozen   . Tubular adenoma of colon 01/31/2000    Dr. Richmond Campbell  . Maternal complication related to childbirth     Sepsis, staph  . Skin cancer     Rt. leg, squamous cell  . Anxiety   . Depression   . COPD (chronic obstructive pulmonary disease) ([place])     Dr. Redge Gainer  . Bowel habit changes     excessive gas /frequent BM's with change in shape with pus  . Meniere's disease   . Chronic pain     left upper shoulder ,neck and back  . Rapid or irregular heartbeat    BP 141/66 mmHg  Pulse 103  Resp 14  SpO2 93%  Opioid Risk Score:   Fall Risk Score:  `1  Depression screen PHQ 2/9  Depression screen [facility] 2/9  06/04/2015 03/09/2015 01/13/2015 12/15/2014 10/01/2014  Decreased Interest 2 2 1 2 2   Down, Depressed, Hopeless 1 1 1 2 2   PHQ - 2 Score 3 3 2 4 4   Altered sleeping - 3 1 3 3   Tired, decreased energy - 3 2 3 3   Change in appetite - 3 1 3 3   Feeling bad or failure about yourself  - 0 1 2 1   Trouble concentrating - 2 1 3 2   Moving slowly or fidgety/restless - 2 1 3 2   Suicidal thoughts - 0 0 0 1  PHQ-9 Score - 16 9 21 19   Difficult doing work/chores - - Somewhat difficult - -     Review of Systems  Constitutional: Positive for diaphoresis, appetite change and unexpected weight change.       Bladder control problems Bowel control problems Loss of taste or smell  Respiratory: Positive for shortness of breath and wheezing.   Gastrointestinal: Positive for nausea, vomiting, abdominal pain and constipation.  Genitourinary: Positive for dysuria and difficulty urinating.  Musculoskeletal: Positive for gait problem.  Skin: Positive for rash.  Neurological: Positive for dizziness, tremors, weakness and numbness.       Tingling spasms  Psychiatric/Behavioral: Positive for confusion. The patient is nervous/anxious.   All other systems reviewed and are negative.      Objective:   Physical Exam  General: Alert and oriented x 3, No apparent distress. Appears to be in good physical condition  HEENT: Head is normocephalic, atraumatic, PERRLA, EOMI, sclera anicteric, oral mucosa pink and moist, dentition intact, ext ear canals clear,  Neck: Supple without JVD or lymphadenopathy  Heart: Reg rate and rhythm. No murmurs rubs or gallops  Chest: CTA bilaterally without wheezes, rales, or rhonchi; no distress  Abdomen: Soft, non-tender, non-distended, bowel sounds positive.  Extremities: No clubbing, cyanosis, or edema. Pulses are 2+  Skin: Clean and intact without signs of breakdown. Area of shoulder surgery noted.  Neuro: Pt is cognitively appropriate with normal insight, memory, and awareness.  Cranial nerves 2-12 are intact. Sensory exam is normal. Reflexes are 2+ in all 4's. Fine motor coordination is intact. No tremors. Motor function is grossly 5/5.  Musculoskeletal: left shoulder limited in ROM.  Area is NOT alodynic. She has spasm along the left trap and SCM. Shoulder girdle positioning and head posture is fair.Marland Kitchen Spurling's test negative again.  Psych: Pt's affect is appropriate. Pt is cooperative and pleasant as always    Assessment &  Plan:   1. Post-traumatic arthritis left shoulder s/p 2 shoulder surgeries. Now with adhesive capsulitis left shoulder.  2. Cervicalgia, spondylosis.  3. Right ITB syndrome  4. Depression/insomnia/anxiety    Plan:  1. She will try OTC TENS---will consider PENS in 2017  2. Robaxin for muscle spasms  3. Gabapentin   4. Naproxen 500mg  bid  5. ITB stretching and exercises  6. Refilled percocet for breakthrough pain.  7. Continue traction/posture exercises for neck.  8. Lexapro for depression/anxiety 9.  I or NP will see her back in about 2 month. 30 minutes of face to face patient care time were spent during this visit. All questions were encouraged and answered.

## 2015-09-01 NOTE — Patient Instructions (Signed)
PLEASE CALL ME WITH ANY PROBLEMS OR QUESTIONS [phone]). HAVE A HAPPY HOLIDAY SEASON!!!   TRY AN OVER THE COUNTER TENS UNIT

## 2015-09-05 ENCOUNTER — Other Ambulatory Visit: Payer: Self-pay | Admitting: Family Medicine

## 2015-09-05 ENCOUNTER — Other Ambulatory Visit: Payer: Self-pay | Admitting: Physical Medicine & Rehabilitation

## 2015-09-07 ENCOUNTER — Other Ambulatory Visit: Payer: Self-pay | Admitting: Family Medicine

## 2015-09-07 ENCOUNTER — Other Ambulatory Visit: Payer: Self-pay | Admitting: Physical Medicine & Rehabilitation

## 2015-09-08 NOTE — Telephone Encounter (Signed)
Last seen 06/09/15 Dr Livia Snellen

## 2015-09-09 ENCOUNTER — Ambulatory Visit: Payer: Medicare Other | Admitting: Family Medicine

## 2015-09-23 ENCOUNTER — Ambulatory Visit (INDEPENDENT_AMBULATORY_CARE_PROVIDER_SITE_OTHER): Payer: Medicare Other | Admitting: Family Medicine

## 2015-09-23 ENCOUNTER — Encounter: Payer: Self-pay | Admitting: Family Medicine

## 2015-09-23 VITALS — BP 135/65 | HR 88 | Temp 96.5°F | Ht 66.0 in | Wt 153.0 lb

## 2015-09-23 DIAGNOSIS — K591 Functional diarrhea: Secondary | ICD-10-CM | POA: Diagnosis not present

## 2015-09-23 DIAGNOSIS — R159 Full incontinence of feces: Secondary | ICD-10-CM

## 2015-09-23 MED ORDER — METRONIDAZOLE 500 MG PO TABS: 500.0000 mg | ORAL_TABLET | Freq: Three times a day (TID) | ORAL | Status: DC

## 2015-09-23 NOTE — Patient Instructions (Signed)
Gluten-Free Diet for Celiac Disease Gluten is a protein found in wheat, rye, barley, and triticale (a cross between wheat and rye) grains. People with celiac disease need to have a gluten-free diet. With celiac disease, gluten interferes with the absorption of food and may also cause intestinal injury.  Strict compliance is important even during symptom-free periods. This means eliminating all foods with gluten from your diet permanently. This requires some significant changes but is very manageable. WHAT DO I NEED TO KNOW ABOUT A GLUTEN-FREE DIET?  Look for items labeled with "GF." Looking for GF will make it easier to identify products that are safe to eat.  Read all labels. Gluten may have been added as a minor ingredient where least expected, such as in shredded cheeses or ice creams. Always check food labels and investigate questionable ingredients. Talk to your dietitian or health care provider if you have questions about certain foods or need help finding GF foods.  Check when in doubt. If you are not sure whether an ingredient contains gluten, check with the manufacturer. Note that some manufacturers may change ingredients without notice. Always read labels.   Know how food is prepared. Since flour and cereal products are often used in the preparation of foods, it is important to be aware of the methods of preparation used, as well as the ingredients in the foods themselves. This is especially true when you are dining out. Ask restaurants if they have a gluten-free menu.  Watch for cross-contamination. Cross-contamination occurs when gluten-free foods come into contact with foods that contain gluten. It often happens during the manufacturing process. Always check the ingredient list and for warnings on packages, such as "may contain gluten."  Eat a balanced diet. It is important to still get enough fiber, iron, and B vitamins in your diet. Look for enriched whole grain gluten-free products  and continue to eat a well-balanced diet of the important non-grain items, such as vegetables, fruit, lean proteins, legumes, and dairy.  Consider taking a gluten-free multivitamin and mineral supplement. Discuss this with your health care provider. WHAT KEY WORDS HELP IDENTIFY GLUTEN? Know key words to help identify gluten. A dietitian can help you identify possible harmful ingredients in the foods you normally eat. Words to check for on food labels include:   Flour, enriched flour, bromated flour, white flour, durum flour, graham flour, phosphated flour, self-rising flour, semolina, or farina.  Starch, dextrin, modified food starch, or cereal.  Thickening, fillers, or emulsifiers.  Any kind of malt flavoring, extract, or syrup (malt is made from barley and includes malt vinegar, malted milk, and malted beverages).  Hydrolyzed vegetable protein. WHAT FOODS CAN I EAT? Below is a list of common foods that are allowed with a gluten-free diet.  Grains Products made from the following flours or grains:amaranth,bean flours, 100% buckwheat flour, corn, millet, nut flours or meals, GF oats, quinoa, rice, sorghum, teff, any all-purpose 100% GF flour mix, rice wafers, pure cornmeal tortillas, popcorn, some crackers, some chips, and hot cereals made from cornmeal. Ask your dietitian which specific hot and cold cereals are allowed. Hominy, rice or wild rice, and special GF pasta. Some Asian rice noodles or bean noodles. Arrowroot starch, corn bran, corn flour, corn germ, cornmeal, corn starch, potato flour, potato starch flour, and rice bran. Rice flours: plain, brown, and sweet. Rice polish, soy flour, tapioca starch. Vegetables All plain, fresh, frozen, or canned vegetables.  Fruits All fresh, frozen, canned, dried fruits, and fruit juices.  Meats and Other   Protein Foods Meat, fish, poultry, or eggs prepared without added wheat, rye, barley, or triticale. Some luncheon meat and some frankfurters.  Pure meat. All aged cheese, most processed cheese products, some cottage cheese, and some cream cheese. Dried beans, dried peas, and lentils.  Dairy Milk and yogurt made with allowed ingredients.  Beverages Coffee (regular or decaffeinated), tea, herbal tea (read label to be sure that no wheat flour has been added). Carbonated beverages and some root beers. Wine, sake, and distilled spirits, such as gin, vodka, and whiskey. GF beers and GF ciders.  Sweetsand Desserts Sugar, honey, some syrups, molasses, jelly, jam, plain hard candy, marshmallows, gumdrops, homemade candies free of wheat, rye, barley, or triticale. Coconut. Custard, some pudding mixes, and homemade puddings from cornstarch, rice, and tapioca. Gelatin desserts, sorbets, frozen ice pops, and sherbet. Cake, cookies, and other desserts prepared with allowed flours. Some commercial ice creams. Ask your dietitian about specific brands of dessert that are allowed.  Fats and Oils Butter, margarine, vegetable oil, sour cream not containing modified food starch, whipping cream, shortening, lard, cream, and some mayonnaise. Some commercial salad dressings. Peanut butter.  Other Homemade broth and soups made with allowed ingredients; some canned or frozen soups. Any other combination or prepared foods that do not contain gluten. Monosodium glutamate (MSG). Cider, rice, and wine vinegar. Baking soda and baking powder. Certain soy sauces (Tamari). Ask your dietitian about specific brands that are allowed. Nuts, coconut, chocolate, and pure cocoa powder. Salt, pepper, herbs, spices, extracts, and food colorings. The items listed above may not be a complete list of allowed foods or beverages. Contact your dietitian for more options.  WHAT FOODS CAN I NOT EAT? Below is a list of common foods that are not allowed with a gluten-free diet.  Grains Barley, bran, bulgur, cracked wheat, graham, malt, matzo, wheat germ, and all wheat and rye cereals  including spelt and kamut. Avoid cereals containing malt as a flavoring, such as rice cereal. Also avoid regular noodles, spaghetti, macaroni, and most packaged rice mixes, and all others containing wheat, rye, barley, or triticale.  Vegetables Most creamed vegetables, most vegetables canned in sauces, and any vegetables prepared with wheat, rye, barley, or triticale.  Fruits Thickened or prepared fruits and some pie fillings.  Meats and Other Protein Sources Any meat or meat alternative containing wheat, rye, barley, or gluten stabilizers (such as some hot dogs, salami, cold cuts, or sausage). Bread-containing products, such as Swiss steak, croquettes, and meatloaf. Most tuna canned in vegetable broth, turkey with hydrolyzed vegetable protein (HVP) injected as part of the basting, and any cheese product containing oat gum as an ingredient. Seitan. Imitation fish. Dairy Commercial chocolate milk, which may have cereal added, and malted milk. Beverages Certain cereal beverages. Beer and ciders (unless GF), ale, malted milk, and some root beers. Sweetsand Desserts Commercial candies containing wheat, rye, barley, or triticale. Certain toffees are dusted with wheat flour. Chocolate-coated nuts, which are often rolled in flour. Cakes, cookies, doughnuts, and pastries that are prepared with wheat, barley, rye, or triticale flour. Some commercial ice creams, ice cream flavors which contain cookies, crumbs, or cheesecake. Ice cream cones. Commercially prepared mixes for cakes, cookies, and other desserts unless marked GF. Bread pudding and other puddings thickened with flour. Fats and Oils Some commercial salad dressings and sour cream containing modified food starch.  Condiments Some curry powder, some dry seasoning mixes, some gravy extracts, some meat sauces, some ketchup, some prepared mustard, horseradish. Other All soups containing wheat,   rye, barley, or triticale flour. Bouillon and bouillon  cubes that contain HVP. Combination or prepared foods that contain gluten. Some soy sauce, some chip dips, and some chewing gum. Yeast extract (contains barley). Caramel color (may contain malt). The items listed above may not be a complete list of foods and beverages to avoid. Contact your dietitian for more information.   This information is not intended to replace advice given to you by your health care provider. Make sure you discuss any questions you have with your health care provider.   Document Released: 08/29/2005 Document Revised: 09/19/2014 Document Reviewed: 07/03/2013 [employer] Interactive Patient Education 2016 [employer]  

## 2015-09-23 NOTE — Progress Notes (Signed)
Subjective:  Patient ID: Cassandra [name], female    DOB: [birth year]  Age: 60 y.o. MRN: [id number]  CC: Encopresis and Rash   HPI Cassandra [name] presents for one year of increasing loose bowel movements. She states she now has 10-15 loose bowels per day. She has upper abdominal pain. She had a colonoscopy one month ago. Some polyps were removed. However the symptoms have continued unabated. She had an upper endoscopy 2 years ago that showed H pylori. She was treated for that. However, now she has epigastric discomfort daily particularly when having one of the multiple bowel movements. She has no appetite. She has no triggers that she knows of with regard to foods etc. Pain is moderate 6/10 and intermittent. Location is epigastric. The bowel movements do not have blood and she has no melena.  The rash is located on her chest and in the cleavage between her breasts.  History Cassandra [name] has a past medical history of Nicotine addiction; Hyperlipidemia; Chronic left shoulder pain; Tubular adenoma of colon (01/31/2000); Maternal complication related to childbirth; Skin cancer; Anxiety; Depression; COPD (chronic obstructive pulmonary disease) ([place]); Bowel habit changes; Meniere's disease; Chronic pain; and Rapid or irregular heartbeat.   She has past surgical history that includes Bladder repair; Shoulder arthroscopy; Abdominal hysterectomy; Knee arthroscopy (Left); Cesarean section; and ruptured uterus.   Her family history includes COPD in her brother; Colon polyps in her sister; Coronary artery disease in her brother, father, and mother; Diabetes in her mother; Heart disease in her sister; Kidney disease in her mother; Liver cancer in her maternal grandmother; Liver disease in her maternal grandmother; Lung cancer in her paternal grandmother; Lung disease in her father; Polymyalgia rheumatica in her sister; Prostate cancer in her father.She reports that she quit smoking about 3 months ago. Her smoking use  included Cigarettes. She smoked 1.00 pack per day. She has never used smokeless tobacco. She reports that she does not drink alcohol or use illicit drugs.    ROS Review of Systems  Constitutional: Positive for activity change (decreased). Negative for fever and appetite change.  HENT: Negative for congestion, rhinorrhea and sore throat.   Eyes: Negative for visual disturbance.  Respiratory: Negative for cough and shortness of breath.   Cardiovascular: Negative for chest pain and palpitations.  Gastrointestinal: Positive for abdominal pain and diarrhea. Negative for nausea.  Genitourinary: Negative for dysuria.  Musculoskeletal: Negative for myalgias and arthralgias.    Objective:  BP 135/65 mmHg  Pulse 88  Temp(Src) 96.5 F (35.8 C) (Oral)  Ht 5' 6"  (1.676 m)  Wt 153 lb (69.4 kg)  BMI 24.71 kg/m2  SpO2 98%  BP Readings from Last 3 Encounters:  09/23/15 135/65  09/01/15 141/66  08/05/15 122/54    Wt Readings from Last 3 Encounters:  09/23/15 153 lb (69.4 kg)  08/05/15 150 lb (68.04 kg)  07/28/15 150 lb (68.04 kg)     Physical Exam  Constitutional: She is oriented to person, place, and time. She appears well-developed and well-nourished. No distress.  HENT:  Head: Normocephalic and atraumatic.  Right Ear: External ear normal.  Left Ear: External ear normal.  Nose: Nose normal.  Mouth/Throat: Oropharynx is clear and moist.  Eyes: Conjunctivae and EOM are normal. Pupils are equal, round, and reactive to light.  Neck: Normal range of motion. Neck supple. No thyromegaly present.  Cardiovascular: Normal rate, regular rhythm and normal heart sounds.   No murmur heard. Pulmonary/Chest: Effort normal and breath sounds normal. No respiratory distress.  She has no wheezes. She has no rales.  Abdominal: Soft. Bowel sounds are normal. She exhibits no distension. There is no tenderness.  Lymphadenopathy:    She has no cervical adenopathy.  Neurological: She is alert and  oriented to person, place, and time. She has normal reflexes.  Skin: Skin is warm and dry.  Maculopapular erythema with minimal excoriation noted. This is primarily in the cleavage area and on the medial aspects of the breasts. It extends into the epigastric region as well.  Psychiatric: She has a normal mood and affect. Her behavior is normal. Judgment and thought content normal.     Lab Results  Component Value Date   WBC 7.8 09/23/2015   HGB 14.7 12/10/2013   HCT 41.0 09/23/2015   PLT 326 09/23/2015   GLUCOSE 86 09/23/2015   CHOL 157 12/10/2013   TRIG 71 12/10/2013   HDL 64 12/10/2013   LDLCALC 79 12/10/2013   ALT 9 09/23/2015   AST 18 09/23/2015   NA 142 09/23/2015   K 5.0 09/23/2015   CL 99 09/23/2015   CREATININE 0.98 09/23/2015   BUN 11 09/23/2015   CO2 25 09/23/2015   TSH 0.808 12/10/2013    Mr Cervical Spine Wo Contrast  07/09/2014  CLINICAL DATA:  Cervical spondylosis without myelopathy. Left shoulder pain. Left C7 radiculopathy. EXAM: MRI CERVICAL SPINE WITHOUT CONTRAST TECHNIQUE: Multiplanar, multisequence MR imaging of the cervical spine was performed. No intravenous contrast was administered. COMPARISON:  Cervical radiographs 09/20/2013. No prior cervical MRI. FINDINGS: Normal cervical alignment with straightening of the cervical lordosis. Negative for fracture or mass. No cord compression or cord lesion. Craniocervical junction is normal. C2-3:  Negative C3-4:  Negative C4-5:  Mild facet degeneration on the left.  No significant stenosis C5-6: Moderate disc degeneration with disc space narrowing and endplate edema related to disc degeneration. There is spondylosis causing mild spinal stenosis and mild foraminal stenosis bilaterally. No disc protrusion identified C6-7: Disc degeneration and spondylosis causing mild spinal stenosis and mild foraminal stenosis bilaterally. C7-T1:  Negative IMPRESSION: Disc degeneration and spondylosis at C5-6 and C6-7 causing mild spinal  stenosis and mild foraminal stenosis bilaterally. No focal disc protrusion. Electronically Signed   By: Franchot Gallo M.D.   On: 07/09/2014 16:58    Assessment & Plan:   Cassandra [name] was seen today for encopresis and rash.  Diagnoses and all orders for this visit:  Functional diarrhea -     Celiac Panel -     CBC with Differential/Platelet -     CMP14+EGFR -     Ambulatory referral to Gastroenterology  Encopresis -     Celiac Panel -     CBC with Differential/Platelet -     CMP14+EGFR -     Ambulatory referral to Gastroenterology  Other orders -     metroNIDAZOLE (FLAGYL) 500 MG tablet; Take 1 tablet (500 mg total) by mouth 3 (three) times daily. -     CMP14+EGFR   I have discontinued Ms. Tarver's omeprazole and traZODone. I am also having her start on metroNIDAZOLE. Additionally, I am having her maintain her conjugated estrogens, Vitamin D, fexofenadine, lidocaine, escitalopram, methocarbamol, polyethylene glycol, gabapentin, ALPRAZolam, triamcinolone ointment, gabapentin, zolpidem, oxyCODONE-acetaminophen, naproxen, and meclizine.  Meds ordered this encounter  Medications  . metroNIDAZOLE (FLAGYL) 500 MG tablet    Sig: Take 1 tablet (500 mg total) by mouth 3 (three) times daily.    Dispense:  21 tablet    Refill:  0   Consider Celiac  dx. Gluten free diet printed for pt with summary of visit  Follow-up: Return in about 2 weeks (around 10/07/2015).  [name], M.D.

## 2015-09-24 LAB — CMP14+EGFR
ALBUMIN: 4.6 g/dL (ref 3.5–5.5)
ALK PHOS: 106 IU/L (ref 39–117)
ALT: 9 IU/L (ref 0–32)
AST: 18 IU/L (ref 0–40)
Albumin/Globulin Ratio: 1.8 (ref 1.1–2.5)
BUN / CREAT RATIO: 11 (ref 9–23)
BUN: 11 mg/dL (ref 6–24)
CHLORIDE: 99 mmol/L (ref 96–106)
CO2: 25 mmol/L (ref 18–29)
CREATININE: 0.98 mg/dL (ref 0.57–1.00)
Calcium: 9.7 mg/dL (ref 8.7–10.2)
GFR calc Af Amer: 73 mL/min/{1.73_m2} (ref >59)
GFR calc non Af Amer: 63 mL/min/{1.73_m2} (ref >59)
GLUCOSE: 86 mg/dL (ref 65–99)
Globulin, Total: 2.6 g/dL (ref 1.5–4.5)
Potassium: 5 mmol/L (ref 3.5–5.2)
Sodium: 142 mmol/L (ref 134–144)
Total Protein: 7.2 g/dL (ref 6.0–8.5)

## 2015-09-24 LAB — GLIA (IGA/G) + TTG IGA
Antigliadin Abs, IgA: 16 units (ref 0–19)
GLIADIN IGG: 4 U (ref 0–19)
Transglutaminase IgA: <2 U/mL (ref 0–3)

## 2015-09-24 LAB — CBC WITH DIFFERENTIAL/PLATELET
Basophils Absolute: 0 10*3/uL (ref 0.0–0.2)
Basos: 1 %
EOS (ABSOLUTE): 0.1 10*3/uL (ref 0.0–0.4)
EOS: 1 %
Hematocrit: 41 % (ref 34.0–46.6)
Hemoglobin: 13.8 g/dL (ref 11.1–15.9)
Immature Grans (Abs): 0 10*3/uL (ref 0.0–0.1)
Immature Granulocytes: 0 %
LYMPHS ABS: 1.9 10*3/uL (ref 0.7–3.1)
Lymphs: 25 %
MCH: 30 pg (ref 26.6–33.0)
MCHC: 33.7 g/dL (ref 31.5–35.7)
MCV: 89 fL (ref 79–97)
MONOS ABS: 0.6 10*3/uL (ref 0.1–0.9)
Monocytes: 8 %
Neutrophils Absolute: 5.1 10*3/uL (ref 1.4–7.0)
Neutrophils: 65 %
PLATELETS: 326 10*3/uL (ref 150–379)
RBC: 4.6 x10E6/uL (ref 3.77–5.28)
RDW: 14.2 % (ref 12.3–15.4)
WBC: 7.8 10*3/uL (ref 3.4–10.8)

## 2015-09-25 ENCOUNTER — Other Ambulatory Visit: Payer: Medicare Other

## 2015-09-25 DIAGNOSIS — Z1212 Encounter for screening for malignant neoplasm of rectum: Secondary | ICD-10-CM

## 2015-09-27 LAB — FECAL OCCULT BLOOD, IMMUNOCHEMICAL: Fecal Occult Bld: NEGATIVE

## 2015-10-01 ENCOUNTER — Other Ambulatory Visit: Payer: Self-pay | Admitting: Family Medicine

## 2015-10-01 NOTE — Telephone Encounter (Signed)
Last seen 09/23/15  Dr Livia Snellen

## 2015-10-07 ENCOUNTER — Other Ambulatory Visit: Payer: Self-pay | Admitting: Physical Medicine & Rehabilitation

## 2015-10-08 ENCOUNTER — Ambulatory Visit (INDEPENDENT_AMBULATORY_CARE_PROVIDER_SITE_OTHER): Payer: Medicare Other | Admitting: Family Medicine

## 2015-10-08 ENCOUNTER — Encounter: Payer: Self-pay | Admitting: Family Medicine

## 2015-10-08 VITALS — BP 139/84 | HR 68 | Temp 97.3°F | Ht 66.0 in | Wt 157.2 lb

## 2015-10-08 DIAGNOSIS — K591 Functional diarrhea: Secondary | ICD-10-CM | POA: Diagnosis not present

## 2015-10-08 DIAGNOSIS — R1084 Generalized abdominal pain: Secondary | ICD-10-CM | POA: Diagnosis not present

## 2015-10-08 DIAGNOSIS — R159 Full incontinence of feces: Secondary | ICD-10-CM | POA: Diagnosis not present

## 2015-10-08 MED ORDER — ESCITALOPRAM OXALATE 20 MG PO TABS: 20.0000 mg | ORAL_TABLET | Freq: Every day | ORAL | Status: DC

## 2015-10-08 MED ORDER — ESTROGENS, CONJUGATED 0.625 MG/GM VA CREA: TOPICAL_CREAM | Freq: Every day | VAGINAL | Status: DC

## 2015-10-08 MED ORDER — MESALAMINE 1.2 G PO TBEC: 2.4000 g | DELAYED_RELEASE_TABLET | Freq: Every day | ORAL | Status: DC

## 2015-10-08 NOTE — Progress Notes (Signed)
Subjective:  Patient ID: Cassandra [name], female    DOB: 29-Jan-1956  Age: 60 y.o. MRN: [id number]  CC: Diarrhea and Dizziness   HPI Cassandra [name] presents for 15 BM daily with encopresis. Had colonoscopy recently. No abnormality found. Still waiting for follow up with GI. Planned for early March. Currently cannot go anywhere for fear of uncontrolled defecation. Occurred recently in [place]. Symptoms are unremitting. She is having multiple loose bowel movements daily. Occasionally there is some cramping associated. This is been ongoing since her last visit and has not improved with treatment. The pain described as cramping diffusely over the abdomen and moderate in intensity waxing and waning. Not associated with food intake.   History Cassandra [name] has a past medical history of Nicotine addiction; Hyperlipidemia; Chronic left shoulder pain; Tubular adenoma of colon (01/31/2000); Maternal complication related to childbirth; Skin cancer; Anxiety; Depression; COPD (chronic obstructive pulmonary disease) ([place]); Bowel habit changes; Meniere's disease; Chronic pain; and Rapid or irregular heartbeat.   She has past surgical history that includes Bladder repair; Shoulder arthroscopy; Abdominal hysterectomy; Knee arthroscopy (Left); Cesarean section; and ruptured uterus.   Her family history includes COPD in her brother; Colon polyps in her sister; Coronary artery disease in her brother, father, and mother; Diabetes in her mother; Heart disease in her sister; Kidney disease in her mother; Liver cancer in her maternal grandmother; Liver disease in her maternal grandmother; Lung cancer in her paternal grandmother; Lung disease in her father; Polymyalgia rheumatica in her sister; Prostate cancer in her father.She reports that she quit smoking about 4 months ago. Her smoking use included Cigarettes. She smoked 1.00 pack per day. She has never used smokeless tobacco. She reports that she does not drink alcohol or use  illicit drugs.    ROS Review of Systems  Constitutional: Negative for fever, activity change and appetite change.  HENT: Negative for congestion, rhinorrhea and sore throat.   Eyes: Negative for visual disturbance.  Respiratory: Negative for cough and shortness of breath.   Cardiovascular: Negative for chest pain and palpitations.  Gastrointestinal: Positive for abdominal pain (cramping intermittenrtly) and abdominal distention. Negative for nausea and diarrhea.  Genitourinary: Negative for dysuria.  Musculoskeletal: Negative for myalgias and arthralgias.    Objective:  BP 139/84 mmHg  Pulse 68  Temp(Src) 97.3 F (36.3 C) (Oral)  Ht 5\' 6"  (1.676 m)  Wt 157 lb 3.2 oz (71.305 kg)  BMI 25.38 kg/m2  BP Readings from Last 3 Encounters:  10/08/15 139/84  09/23/15 135/65  09/01/15 141/66    Wt Readings from Last 3 Encounters:  10/08/15 157 lb 3.2 oz (71.305 kg)  09/23/15 153 lb (69.4 kg)  08/05/15 150 lb (68.04 kg)     Physical Exam  Constitutional: She is oriented to person, place, and time. She appears well-developed and well-nourished. No distress.  HENT:  Head: Normocephalic and atraumatic.  Right Ear: External ear normal.  Left Ear: External ear normal.  Nose: Nose normal.  Mouth/Throat: Oropharynx is clear and moist.  Eyes: Conjunctivae and EOM are normal. Pupils are equal, round, and reactive to light.  Neck: Normal range of motion. Neck supple. No thyromegaly present.  Cardiovascular: Normal rate, regular rhythm and normal heart sounds.   No murmur heard. Pulmonary/Chest: Effort normal and breath sounds normal. No respiratory distress. She has no wheezes. She has no rales.  Abdominal: Soft. Bowel sounds are normal. She exhibits no distension and no mass. There is tenderness (mild and diffuse). There is no rebound and no  guarding.  Lymphadenopathy:    She has no cervical adenopathy.  Neurological: She is alert and oriented to person, place, and time. She has  normal reflexes.  Skin: Skin is warm and dry.  Psychiatric: She has a normal mood and affect. Her behavior is normal. Judgment and thought content normal.     Lab Results  Component Value Date   WBC 7.8 09/23/2015   HGB 14.7 12/10/2013   HCT 41.0 09/23/2015   PLT 326 09/23/2015   GLUCOSE 86 09/23/2015   CHOL 157 12/10/2013   TRIG 71 12/10/2013   HDL 64 12/10/2013   LDLCALC 79 12/10/2013   ALT 9 09/23/2015   AST 18 09/23/2015   NA 142 09/23/2015   K 5.0 09/23/2015   CL 99 09/23/2015   CREATININE 0.98 09/23/2015   BUN 11 09/23/2015   CO2 25 09/23/2015   TSH 0.808 12/10/2013    Mr Cervical Spine Wo Contrast  07/09/2014  CLINICAL DATA:  Cervical spondylosis without myelopathy. Left shoulder pain. Left C7 radiculopathy. EXAM: MRI CERVICAL SPINE WITHOUT CONTRAST TECHNIQUE: Multiplanar, multisequence MR imaging of the cervical spine was performed. No intravenous contrast was administered. COMPARISON:  Cervical radiographs 09/20/2013. No prior cervical MRI. FINDINGS: Normal cervical alignment with straightening of the cervical lordosis. Negative for fracture or mass. No cord compression or cord lesion. Craniocervical junction is normal. C2-3:  Negative C3-4:  Negative C4-5:  Mild facet degeneration on the left.  No significant stenosis C5-6: Moderate disc degeneration with disc space narrowing and endplate edema related to disc degeneration. There is spondylosis causing mild spinal stenosis and mild foraminal stenosis bilaterally. No disc protrusion identified C6-7: Disc degeneration and spondylosis causing mild spinal stenosis and mild foraminal stenosis bilaterally. C7-T1:  Negative IMPRESSION: Disc degeneration and spondylosis at C5-6 and C6-7 causing mild spinal stenosis and mild foraminal stenosis bilaterally. No focal disc protrusion. Electronically Signed   By: Franchot Gallo M.D.   On: 07/09/2014 16:58    Assessment & Plan:   Cassandra [name] was seen today for diarrhea and  dizziness.  Diagnoses and all orders for this visit:  Encopresis  Functional diarrhea  Generalized abdominal pain  Other orders -     escitalopram (LEXAPRO) 20 MG tablet; Take 1 tablet (20 mg total) by mouth at bedtime. -     conjugated estrogens (PREMARIN) vaginal cream; Place vaginally daily. -     mesalamine (LIALDA) 1.2 g EC tablet; Take 2 tablets (2.4 g total) by mouth daily with breakfast.      I have discontinued Cassandra [name]'s escitalopram. I have also changed her escitalopram. Additionally, I am having her start on mesalamine. Lastly, I am having her maintain her Vitamin D, fexofenadine, lidocaine, methocarbamol, polyethylene glycol, ALPRAZolam, triamcinolone ointment, oxyCODONE-acetaminophen, naproxen, meclizine, metroNIDAZOLE, gabapentin, and conjugated estrogens.  Meds ordered this encounter  Medications  . DISCONTD: escitalopram (LEXAPRO) 20 MG tablet    Sig: Take 20 mg by mouth at bedtime.  Marland Kitchen escitalopram (LEXAPRO) 20 MG tablet    Sig: Take 1 tablet (20 mg total) by mouth at bedtime.    Dispense:  90 tablet    Refill:  1  . conjugated estrogens (PREMARIN) vaginal cream    Sig: Place vaginally daily.    Dispense:  42.5 g    Refill:  5  . mesalamine (LIALDA) 1.2 g EC tablet    Sig: Take 2 tablets (2.4 g total) by mouth daily with breakfast.    Dispense:  60 tablet    Refill:  1  Follow-up: Return in about 2 weeks (around 10/22/2015).  [name], M.D.

## 2015-10-09 ENCOUNTER — Other Ambulatory Visit: Payer: Self-pay | Admitting: Physical Medicine & Rehabilitation

## 2015-10-23 ENCOUNTER — Encounter: Payer: Self-pay | Admitting: Family Medicine

## 2015-10-23 ENCOUNTER — Ambulatory Visit (INDEPENDENT_AMBULATORY_CARE_PROVIDER_SITE_OTHER): Payer: Medicare Other | Admitting: Family Medicine

## 2015-10-23 VITALS — BP 107/63 | HR 78 | Temp 98.1°F | Ht 66.0 in | Wt 155.0 lb

## 2015-10-23 DIAGNOSIS — K591 Functional diarrhea: Secondary | ICD-10-CM | POA: Diagnosis not present

## 2015-10-23 DIAGNOSIS — L251 Unspecified contact dermatitis due to drugs in contact with skin: Secondary | ICD-10-CM

## 2015-10-23 DIAGNOSIS — R1084 Generalized abdominal pain: Secondary | ICD-10-CM

## 2015-10-23 DIAGNOSIS — R159 Full incontinence of feces: Secondary | ICD-10-CM | POA: Diagnosis not present

## 2015-10-23 MED ORDER — MESALAMINE 1.2 G PO TBEC: 3.6000 g | DELAYED_RELEASE_TABLET | Freq: Every day | ORAL | Status: DC

## 2015-10-23 NOTE — Progress Notes (Signed)
Subjective:  Patient ID: Cassandra [name], female    DOB: 1956-08-14  Age: 60 y.o. MRN: [id number]  CC: Encopresis   HPI THREASA ROCKHOLT presents for significant improvement in bowel movements in that she's gone from having 15 a day down to 6 or 7 a day. Additionally she is having pencil thin bowel movements but they are at least formed. There is no pain associated. She states that the [place] but agrees with her without side effects noted. She is currently taking 2 a day at noon. There is still a rash and she seems to be getting some new places. It's primarily on the upper abdomen but some on the legs as well. No response to the triamcinolone. History Maraiah has a past medical history of Nicotine addiction; Hyperlipidemia; Chronic left shoulder pain; Tubular adenoma of colon (01/31/2000); Maternal complication related to childbirth; Skin cancer; Anxiety; Depression; COPD (chronic obstructive pulmonary disease) ([place]); Bowel habit changes; Meniere's disease; Chronic pain; and Rapid or irregular heartbeat.   She has past surgical history that includes Bladder repair; Shoulder arthroscopy; Abdominal hysterectomy; Knee arthroscopy (Left); Cesarean section; and ruptured uterus.   Her family history includes COPD in her brother; Colon polyps in her sister; Coronary artery disease in her brother, father, and mother; Diabetes in her mother; Heart disease in her sister; Kidney disease in her mother; Liver cancer in her maternal grandmother; Liver disease in her maternal grandmother; Lung cancer in her paternal grandmother; Lung disease in her father; Polymyalgia rheumatica in her sister; Prostate cancer in her father.She reports that she quit smoking about 4 months ago. Her smoking use included Cigarettes. She smoked 1.00 pack per day. She has never used smokeless tobacco. She reports that she does not drink alcohol or use illicit drugs.    ROS Review of Systems  Constitutional: Negative for fever.  HENT:  Negative for congestion, rhinorrhea and sore throat.   Respiratory: Negative for cough and shortness of breath.   Cardiovascular: Negative for chest pain and palpitations.  Gastrointestinal: Negative for abdominal pain.  Musculoskeletal: Negative for myalgias and arthralgias.    Objective:  BP 107/63 mmHg  Pulse 78  Temp(Src) 98.1 F (36.7 C) (Oral)  Ht 5\' 6"  (1.676 m)  Wt 155 lb (70.308 kg)  BMI 25.03 kg/m2  SpO2 96%  BP Readings from Last 3 Encounters:  10/23/15 107/63  10/08/15 139/84  09/23/15 135/65    Wt Readings from Last 3 Encounters:  10/23/15 155 lb (70.308 kg)  10/08/15 157 lb 3.2 oz (71.305 kg)  09/23/15 153 lb (69.4 kg)     Physical Exam  Constitutional: She is oriented to person, place, and time. She appears well-developed and well-nourished.  HENT:  Head: Normocephalic and atraumatic.  Cardiovascular: Normal rate and regular rhythm.   No murmur heard. Pulmonary/Chest: Effort normal and breath sounds normal.  Abdominal: Soft. Bowel sounds are normal. She exhibits no mass. There is tenderness. There is no rebound and no guarding.  Neurological: She is alert and oriented to person, place, and time.  Skin: Skin is warm and dry.  Psychiatric: She has a normal mood and affect. Her behavior is normal.     Lab Results  Component Value Date   WBC 7.8 09/23/2015   HGB 14.7 12/10/2013   HCT 41.0 09/23/2015   PLT 326 09/23/2015   GLUCOSE 86 09/23/2015   CHOL 157 12/10/2013   TRIG 71 12/10/2013   HDL 64 12/10/2013   LDLCALC 79 12/10/2013   ALT 9 09/23/2015  AST 18 09/23/2015   NA 142 09/23/2015   K 5.0 09/23/2015   CL 99 09/23/2015   CREATININE 0.98 09/23/2015   BUN 11 09/23/2015   CO2 25 09/23/2015   TSH 0.808 12/10/2013    Mr Cervical Spine Wo Contrast  07/09/2014  CLINICAL DATA:  Cervical spondylosis without myelopathy. Left shoulder pain. Left C7 radiculopathy. EXAM: MRI CERVICAL SPINE WITHOUT CONTRAST TECHNIQUE: Multiplanar, multisequence MR  imaging of the cervical spine was performed. No intravenous contrast was administered. COMPARISON:  Cervical radiographs 09/20/2013. No prior cervical MRI. FINDINGS: Normal cervical alignment with straightening of the cervical lordosis. Negative for fracture or mass. No cord compression or cord lesion. Craniocervical junction is normal. C2-3:  Negative C3-4:  Negative C4-5:  Mild facet degeneration on the left.  No significant stenosis C5-6: Moderate disc degeneration with disc space narrowing and endplate edema related to disc degeneration. There is spondylosis causing mild spinal stenosis and mild foraminal stenosis bilaterally. No disc protrusion identified C6-7: Disc degeneration and spondylosis causing mild spinal stenosis and mild foraminal stenosis bilaterally. C7-T1:  Negative IMPRESSION: Disc degeneration and spondylosis at C5-6 and C6-7 causing mild spinal stenosis and mild foraminal stenosis bilaterally. No focal disc protrusion. Electronically Signed   By: Franchot Gallo M.D.   On: 07/09/2014 16:58    Assessment & Plan:   Hydee was seen today for encopresis.  Diagnoses and all orders for this visit:  Encopresis  Functional diarrhea  Generalized abdominal pain  Dermatitis, contact, from medications/drugs -     Ambulatory referral to Dermatology  Other orders -     mesalamine (LIALDA) 1.2 g EC tablet; Take 3 tablets (3.6 g total) by mouth daily.    Moderate improvement noted. We'll continue with daily mesalamine at an increased dose to see if we can further improve her outcome. Additionally she is scheduled to see GI in about 4-6 weeks. Follow-up here will be as needed for this problem pending evaluation by GI.  I have discontinued Ms. Nham's metroNIDAZOLE. I have also changed her mesalamine. Additionally, I am having her maintain her Vitamin D, fexofenadine, lidocaine, methocarbamol, polyethylene glycol, ALPRAZolam, triamcinolone ointment, oxyCODONE-acetaminophen, naproxen,  meclizine, gabapentin, escitalopram, conjugated estrogens, and zolpidem.  Meds ordered this encounter  Medications  . mesalamine (LIALDA) 1.2 g EC tablet    Sig: Take 3 tablets (3.6 g total) by mouth daily.    Dispense:  90 tablet    Refill:  1     Follow-up: Return in about 3 months (around 01/20/2016).  [name], M.D.

## 2015-10-30 ENCOUNTER — Encounter: Payer: Self-pay | Admitting: Registered Nurse

## 2015-10-30 ENCOUNTER — Encounter: Payer: Medicare Other | Attending: Physical Medicine & Rehabilitation | Admitting: Registered Nurse

## 2015-10-30 VITALS — BP 123/75 | HR 72 | Resp 14

## 2015-10-30 DIAGNOSIS — M7918 Myalgia, other site: Secondary | ICD-10-CM

## 2015-10-30 DIAGNOSIS — Z5181 Encounter for therapeutic drug level monitoring: Secondary | ICD-10-CM

## 2015-10-30 DIAGNOSIS — M25512 Pain in left shoulder: Secondary | ICD-10-CM | POA: Diagnosis not present

## 2015-10-30 DIAGNOSIS — Z79899 Other long term (current) drug therapy: Secondary | ICD-10-CM | POA: Diagnosis not present

## 2015-10-30 DIAGNOSIS — M7631 Iliotibial band syndrome, right leg: Secondary | ICD-10-CM | POA: Diagnosis not present

## 2015-10-30 DIAGNOSIS — R279 Unspecified lack of coordination: Secondary | ICD-10-CM

## 2015-10-30 DIAGNOSIS — M7502 Adhesive capsulitis of left shoulder: Secondary | ICD-10-CM | POA: Diagnosis not present

## 2015-10-30 DIAGNOSIS — F329 Major depressive disorder, single episode, unspecified: Secondary | ICD-10-CM

## 2015-10-30 DIAGNOSIS — M542 Cervicalgia: Secondary | ICD-10-CM | POA: Diagnosis not present

## 2015-10-30 DIAGNOSIS — G8929 Other chronic pain: Secondary | ICD-10-CM | POA: Diagnosis not present

## 2015-10-30 DIAGNOSIS — F411 Generalized anxiety disorder: Secondary | ICD-10-CM

## 2015-10-30 DIAGNOSIS — M791 Myalgia: Secondary | ICD-10-CM | POA: Diagnosis not present

## 2015-10-30 DIAGNOSIS — M7501 Adhesive capsulitis of right shoulder: Secondary | ICD-10-CM | POA: Diagnosis not present

## 2015-10-30 DIAGNOSIS — G894 Chronic pain syndrome: Secondary | ICD-10-CM | POA: Diagnosis not present

## 2015-10-30 DIAGNOSIS — F32A Depression, unspecified: Secondary | ICD-10-CM

## 2015-10-30 DIAGNOSIS — G4700 Insomnia, unspecified: Secondary | ICD-10-CM

## 2015-10-30 MED ORDER — OXYCODONE-ACETAMINOPHEN 5-325 MG PO TABS: 1.0000 | ORAL_TABLET | Freq: Three times a day (TID) | ORAL | Status: DC | PRN

## 2015-10-30 NOTE — Progress Notes (Signed)
Subjective:    Patient ID: Cassandra [name], female    DOB: [birth year], 61 y.o.   MRN: [id number]  HPI: Cassandra [name] is a 60 year old female who returns for follow up for chronic pain and medication refill. She says her pain is located in her neck,left shoulder, left arm and mid back left side. She did not rate her pain today. Her current exercise regime is walking.  Cassandra [name] states she has noticed increase lack of coordination, also states she has Meniere's Disease and she is relating her coordination abnormality to this. She refuses Neurology referral at this time. Will call office if she changes her mind. Also states she fell last month while at [facility] she lost her balanced and landed on her buttocks. She was able to get up herself, she didn't seek medical attention.   Pain Inventory Average Pain 7 Pain Right Now 7 My pain is burning, dull, stabbing, tingling and aching  In the last 24 hours, has pain interfered with the following? General activity 6 Relation with others 7 Enjoyment of life 8 What TIME of day is your pain at its worst? morning, evening, night Sleep (in general) Poor  Pain is worse with: bending, sitting, standing and some activites Pain improves with: heat/ice, pacing activities and medication Relief from Meds: 5  Mobility walk without assistance ability to climb steps?  yes do you drive?  yes needs help with transfers  Function not employed: date last employed . disabled: date disabled . I need assistance with the following:  dressing, household duties and shopping  Neuro/Psych bladder control problems bowel control problems weakness numbness tremor tingling trouble walking spasms dizziness confusion depression anxiety  Prior Studies Any changes since last visit?  yes bone scan x-rays CT/MRI nerve study  Physicians involved in your care Any changes since last visit?  no   Family History  Problem Relation Age of Onset  .  Coronary artery disease Mother   . Diabetes Mother     borderline  . Kidney disease Mother     stage 4  . Coronary artery disease Father   . Prostate cancer Father   . Lung disease Father     black lung  . Liver cancer Maternal Grandmother   . Liver disease Maternal Grandmother   . Colon cancer      Both sides  . Polymyalgia rheumatica Sister   . Heart disease Sister   . Coronary artery disease Brother     heart attack  . COPD Brother   . Colon polyps Sister   . Lung cancer Paternal Grandmother    Social History   Social History  . Marital Status: Married    Spouse Name: N/A  . Number of Children: 1  . Years of Education: N/A   Occupational History  . Homemaker    Social History Main Topics  . Smoking status: Former Smoker -- 1.00 packs/day    Types: Cigarettes    Quit date: 06/01/2015  . Smokeless tobacco: Never Used     Comment: would like to try chantix, but has not started on it due to warnings related to depression  . Alcohol Use: No  . Drug Use: No  . Sexual Activity: Yes   Other Topics Concern  . None   Social History Narrative   Past Surgical History  Procedure Laterality Date  . Bladder repair      sling/with perforation  . Shoulder arthroscopy  multiple  . Abdominal hysterectomy      age 89  . Knee arthroscopy Left   . Cesarean section      1 time  . Ruptured uterus     Past Medical History  Diagnosis Date  . Nicotine addiction     2 packs per day   . Hyperlipidemia   . Chronic left shoulder pain     frozen   . Tubular adenoma of colon 01/31/2000    Dr. Richmond Campbell  . Maternal complication related to childbirth     Sepsis, staph  . Skin cancer     Rt. leg, squamous cell  . Anxiety   . Depression   . COPD (chronic obstructive pulmonary disease) ([place])     Dr. Redge Gainer  . Bowel habit changes     excessive gas /frequent BM's with change in shape with pus  . Meniere's disease   . Chronic pain     left upper shoulder ,neck  and back  . Rapid or irregular heartbeat    BP 123/75 mmHg  Pulse 72  Resp 14  SpO2 99%  Opioid Risk Score:   Fall Risk Score:  `1  Depression screen PHQ 2/9  Depression screen [facility] 2/9 06/04/2015 03/09/2015 01/13/2015 12/15/2014 10/01/2014  Decreased Interest 2 2 1 2 2   Down, Depressed, Hopeless 1 1 1 2 2   PHQ - 2 Score 3 3 2 4 4   Altered sleeping - 3 1 3 3   Tired, decreased energy - 3 2 3 3   Change in appetite - 3 1 3 3   Feeling bad or failure about yourself  - 0 1 2 1   Trouble concentrating - 2 1 3 2   Moving slowly or fidgety/restless - 2 1 3 2   Suicidal thoughts - 0 0 0 1  PHQ-9 Score - 16 9 21 19   Difficult doing work/chores - - Somewhat difficult - -     Review of Systems  Constitutional: Positive for diaphoresis, appetite change and unexpected weight change.  Gastrointestinal: Positive for nausea, vomiting, abdominal pain, diarrhea and constipation.  Genitourinary: Positive for dysuria.  Skin: Positive for rash.  All other systems reviewed and are negative.      Objective:   Physical Exam  Constitutional: She is oriented to person, place, and time. She appears well-developed and well-nourished.  HENT:  Head: Normocephalic and atraumatic.  Neck: Normal range of motion. Neck supple.  Cardiovascular: Normal rate and regular rhythm.   Pulmonary/Chest: Effort normal and breath sounds normal.  Musculoskeletal:  Normal Muscle Bulk and Muscle Testing Reveals: Upper Extremities:Right: Full ROM and Muscle Strength 5/5 Left: Decreased ROM 45 Degrees and Muscle Strength 3/5 Left:Thoracic and Lumbar Hypersensitivity Lower Extremities: Full ROM and Muscle Strength 5/5 Arises from chair with ease Narrow Based Gait  Neurological: She is alert and oriented to person, place, and time.  Skin: Skin is warm and dry.  Psychiatric: She has a normal mood and affect.  Nursing note and vitals reviewed.         Assessment & Plan:  1. Post-traumatic arthritis left shoulder s/p 2  shoulder surgeries. Now with adhesive capsulitis left shoulder. :  Refilled:oxyCODONE 5/325mg  one tablet every 8 hours as needed. #70. Second script given for the following month. 2. Cervicalgia, spondylosis: Continue Current Medication Regime.Lidoderm patches, Robaxin, Naprosyn, and Gabapentin 3. Depression/insomnia/anxiety: Continue Lexapro. Continue Ambien 5 mg HS #30. Dr. Livia Snellen prescribing Xanax.  4. Lack of coordination: Refuses Referral to Neurology: She bought a cane for  support.  20 minutes of face to face patient care time was spent during this visit. All questions were encouraged and answered  F/U in 2 months

## 2015-11-12 ENCOUNTER — Other Ambulatory Visit: Payer: Self-pay | Admitting: Family Medicine

## 2015-11-12 LAB — TOXASSURE SELECT,+ANTIDEPR,UR: PDF: 0

## 2015-11-13 NOTE — Progress Notes (Signed)
Urine drug screen for this encounter is consistent for prescribed medication 

## 2015-11-18 ENCOUNTER — Other Ambulatory Visit: Payer: Self-pay | Admitting: Physical Medicine & Rehabilitation

## 2015-11-19 ENCOUNTER — Ambulatory Visit (INDEPENDENT_AMBULATORY_CARE_PROVIDER_SITE_OTHER): Payer: Medicare Other | Admitting: Gastroenterology

## 2015-11-19 ENCOUNTER — Encounter: Payer: Self-pay | Admitting: Gastroenterology

## 2015-11-19 ENCOUNTER — Other Ambulatory Visit: Payer: Self-pay | Admitting: *Deleted

## 2015-11-19 ENCOUNTER — Other Ambulatory Visit: Payer: Self-pay

## 2015-11-19 VITALS — BP 100/62 | HR 68 | Ht 65.5 in | Wt 152.0 lb

## 2015-11-19 DIAGNOSIS — R159 Full incontinence of feces: Secondary | ICD-10-CM | POA: Diagnosis not present

## 2015-11-19 DIAGNOSIS — R197 Diarrhea, unspecified: Secondary | ICD-10-CM | POA: Diagnosis not present

## 2015-11-19 NOTE — Patient Instructions (Addendum)
You have been scheduled for an MRI/Defeography at [facility] on 11/26/2015  . Your appointment time is 8:45am . Please arrive 15 minutes prior to your appointment time for registration purposes. Please make certain not to have anything to eat or drink 6 hours prior to your test. In addition, if you have any metal in your body, have a pacemaker or defibrillator, please be sure to let your ordering physician know. This test typically takes 45 minutes to 1 hour to complete.  Go to the basement for labs today  You have been scheduled to have an anorectal manometry at [facility] on 12/09/2015  At  8:30am. Please arrive 30 minutes prior to your appointment time for registration (1st floor of the hospital-admissions).  Please make certain to use 1 Fleets enema 2 hours prior to coming for your appointment. You can purchase Fleets enemas from the laxative section at your drug store. You should not eat anything during the two hours prior to the procedure. You may take regular medications with small sips of water at least 2 hours prior to the study.  Anorectal manometry is a test performed to evaluate patients with constipation or fecal incontinence. This test measures the pressures of the anal sphincter muscles, the sensation in the rectum, and the neural reflexes that are needed for normal bowel movements.  THE PROCEDURE The test takes approximately 30 minutes to 1 hour. You will be asked to change into a hospital gown. A technician or nurse will explain the procedure to you, take a brief health history, and answer any questions you may have. The patient then lies on his or her left side. A small, flexible tube, about the size of a thermometer, with a balloon at the end is inserted into the rectum. The catheter is connected to a machine that measures the pressure. During the test, the small balloon attached to the catheter may be inflated in the rectum to assess the normal reflex pathways. The  nurse or technician may also ask the person to squeeze, relax, and push at various times. The anal sphincter muscle pressures are measured during each of these maneuvers. To squeeze, the patient tightens the sphincter muscles as if trying to prevent anything from coming out. To push or bear down, the patient strains down as if trying to have a bowel movement.

## 2015-11-19 NOTE — Progress Notes (Signed)
Cassandra [name]    [id number]    June 04, 1956  Primary Care Physician:STACKS,WARREN, MD  Referring Physician: [name], MD [place], [place] [address]  Chief complaint:  Diarrhea, weight loss, fecal incontinence  HPI: 60 yr female here with complaints of fecal incontinence and diarrhea for past few years. On further questioning appears that patient really has more of fecal incontinence than diarrhea where she leaks liquid stool or passes small pieces of formed stool multiple times a day. No blood per rectum. Recently underwent colonoscopy with no mucosal abnormalities concerning for inflammatory bowel disease. She had EGD in 2014 that was normal. Labs done last month by PMD were normal, negative celiac disease workup. He complains of weight loss but actually based on our records her weights been stable over the past 1 year and has gained some weight compared to prior. She had bladder sling surgery many years ago as per patient's report was complicated and had to be revised twice. No other rectal or pelvic surgery. Denies anal sex or trauma. No back injury or surgeries.  Outpatient Encounter Prescriptions as of 11/19/2015  Medication Sig  . ALPRAZolam (XANAX) 1 MG tablet Take one tablet twice daily for Meniere's Disease  . Cholecalciferol (VITAMIN D) 1000 UNITS capsule Take 2,000 Units by mouth 2 (two) times daily. Reported on 09/23/2015  . conjugated estrogens (PREMARIN) vaginal cream Place vaginally daily.  Marland Kitchen escitalopram (LEXAPRO) 20 MG tablet Take 1 tablet (20 mg total) by mouth at bedtime.  . fexofenadine (ALLEGRA) 180 MG tablet Take 180 mg by mouth daily as needed.    . gabapentin (NEURONTIN) 100 MG capsule Take 100 mg by mouth as needed.  . gabapentin (NEURONTIN) 300 MG capsule TAKE 1 CAPSULE (300 MG TOTAL) BY MOUTH 3 (THREE) TIMES DAILY.  Marland Kitchen lidocaine (LIDODERM) 5 % Place 1 patch onto the skin as needed. Remove & Discard patch within 12 hours or as directed by MD     . meclizine (ANTIVERT) 25 MG tablet TAKE 1 TABLET (25 MG TOTAL) BY MOUTH 2 (TWO) TIMES DAILY AS NEEDED.  Marland Kitchen mesalamine (LIALDA) 1.2 g EC tablet Take 3 tablets (3.6 g total) by mouth daily.  . methocarbamol (ROBAXIN) 500 MG tablet TAKE 1 TABLET EVERY 6 HOURS AS NEEDED FOR MUSCLE SPASMS LAST FOR ONE MONTH  . naproxen (NAPROSYN) 500 MG tablet TAKE 1 TABLET (500 MG TOTAL) BY MOUTH 2 (TWO) TIMES DAILY WITH A MEAL.  Marland Kitchen oxyCODONE-acetaminophen (PERCOCET) 5-325 MG tablet Take 1 tablet by mouth every 8 (eight) hours as needed.  . triamcinolone ointment (KENALOG) 0.5 % Apply 1 application topically 2 (two) times daily.  Marland Kitchen zolpidem (AMBIEN) 5 MG tablet TAKE 1 TABLET AT BEDTIME AS NEEDED  . [DISCONTINUED] methocarbamol (ROBAXIN) 500 MG tablet TAKE 1 TABLET EVERY 6 HOURS AS NEEDED FOR MUSCLE SPASMS LAST FOR ONE MONTH  . [DISCONTINUED] polyethylene glycol (MIRALAX / GLYCOLAX) packet Take 17 g by mouth daily as needed. Reported on 10/08/2015   No facility-administered encounter medications on file as of 11/19/2015.    Allergies as of 11/19/2015 - Review Complete 11/19/2015  Allergen Reaction Noted  . Chantix [varenicline]  07/28/2015  . Codeine  06/02/2008  . Cymbalta [duloxetine hcl]  01/08/2013  . Livalo [pitavastatin]  01/08/2013  . Metoprolol Other (See Comments) 01/12/2015  . Oxycodone-acetaminophen  06/02/2008  . Oxycodone-aspirin  06/02/2008  . Oxycontin [oxycodone hcl]  02/07/2013  . Propoxyphene n-acetaminophen  06/02/2008  . Trazodone  and nefazodone  07/28/2015  . Zithromax [azithromycin]  11/29/2010  . Other Rash 12/18/2013    Past Medical History  Diagnosis Date  . Nicotine addiction     2 packs per day   . Hyperlipidemia   . Chronic left shoulder pain     frozen   . Tubular adenoma of colon 01/31/2000    Dr. Richmond Campbell  . Maternal complication related to childbirth     Sepsis, staph  . Skin cancer     Rt. leg, squamous cell  . Anxiety   . Depression   . COPD (chronic  obstructive pulmonary disease) ([place])     Dr. Redge Gainer  . Bowel habit changes     excessive gas /frequent BM's with change in shape with pus  . Meniere's disease   . Chronic pain     left upper shoulder ,neck and back  . Rapid or irregular heartbeat     Past Surgical History  Procedure Laterality Date  . Bladder repair      sling/with perforation  . Shoulder arthroscopy      multiple  . Abdominal hysterectomy      age 49  . Knee arthroscopy Left   . Cesarean section      1 time  . Ruptured uterus      Family History  Problem Relation Age of Onset  . Coronary artery disease Mother   . Diabetes Mother     borderline  . Kidney disease Mother     stage 4  . Coronary artery disease Father   . Prostate cancer Father   . Lung disease Father     black lung  . Liver cancer Maternal Grandmother   . Liver disease Maternal Grandmother   . Colon cancer      Both sides  . Polymyalgia rheumatica Sister   . Heart disease Sister   . Coronary artery disease Brother     heart attack  . COPD Brother   . Colon polyps Sister   . Lung cancer Paternal Grandmother     Social History   Social History  . Marital Status: Married    Spouse Name: N/A  . Number of Children: 1  . Years of Education: N/A   Occupational History  . Homemaker    Social History Main Topics  . Smoking status: Former Smoker -- 1.00 packs/day    Types: Cigarettes    Quit date: 06/01/2015  . Smokeless tobacco: Never Used     Comment: would like to try chantix, but has not started on it due to warnings related to depression  . Alcohol Use: No  . Drug Use: No  . Sexual Activity: Yes   Other Topics Concern  . Not on file   Social History Narrative      Review of systems: Review of Systems  Constitutional: Negative for fever and chills.  HENT: Negative.   Eyes: Negative for blurred vision.  Respiratory: Negative for cough, shortness of breath and wheezing.   Cardiovascular: Negative for chest  pain and palpitations.  Gastrointestinal: as per HPI Genitourinary: Negative for dysuria, urgency, frequency and hematuria.  Musculoskeletal: Negative for myalgias, back pain and joint pain.  Skin: Negative for itching and rash.  Neurological: Negative for dizziness, tremors, focal weakness, seizures and loss of consciousness.  Endo/Heme/Allergies: Negative for environmental allergies.  Psychiatric/Behavioral: Negative for depression, suicidal ideas and hallucinations.  All other systems reviewed and are negative.   Physical Exam: Filed Vitals:  11/19/15 0958  BP: 100/62  Pulse: 68   Gen:      No acute distress HEENT:  EOMI, sclera anicteric Neck:     No masses; no thyromegaly Lungs:    Clear to auscultation bilaterally; normal respiratory effort CV:         Regular rate and rhythm; no murmurs Abd:      + bowel sounds; soft, non-tender; no palpable masses, no distension Ext:    No edema; adequate peripheral perfusion Skin:      Warm and dry; no rash Neuro: alert and oriented x 3 Psych: normal mood and affect  Data Reviewed: Reviewed her chart in detail in epic   Assessment and Plan/Recommendations: 60 year old female with complaints of diarrhea and fecal incontinence worsening of the past few years She had bladder sling surgery many years ago as per patient's report was complicated and had to be revised twice We will obtain MRI defecography to evaluate for possible rectocele, enterocele or cystocele and also possible pelvic prolapse  Anorectal manometry and based on findings we'll schedule for physical therapy with biofeedback Check fecal lactoferrin and elastase Return in 3-6 months  K. Denzil Magnuson , MD [phone] Mon-Fri 8a-5p [phone] after 5p, weekends, holidays

## 2015-11-25 ENCOUNTER — Other Ambulatory Visit: Payer: Medicare Other

## 2015-11-25 ENCOUNTER — Other Ambulatory Visit: Payer: Self-pay | Admitting: *Deleted

## 2015-11-25 ENCOUNTER — Other Ambulatory Visit: Payer: Self-pay | Admitting: Gastroenterology

## 2015-11-25 DIAGNOSIS — R197 Diarrhea, unspecified: Secondary | ICD-10-CM

## 2015-11-25 DIAGNOSIS — R159 Full incontinence of feces: Secondary | ICD-10-CM | POA: Diagnosis not present

## 2015-11-26 ENCOUNTER — Other Ambulatory Visit: Payer: Self-pay

## 2015-11-26 ENCOUNTER — Ambulatory Visit
Admission: RE | Admit: 2015-11-26 | Discharge: 2015-11-26 | Disposition: A | Discharge status: Home / Self Care | Payer: Medicare Other | Source: Ambulatory Visit | Attending: Gastroenterology | Admitting: Gastroenterology

## 2015-11-26 DIAGNOSIS — N8189 Other female genital prolapse: Secondary | ICD-10-CM

## 2015-11-26 DIAGNOSIS — R159 Full incontinence of feces: Secondary | ICD-10-CM

## 2015-11-26 DIAGNOSIS — IMO0002 Reserved for concepts with insufficient information to code with codable children: Secondary | ICD-10-CM

## 2015-11-26 DIAGNOSIS — N8110 Cystocele, unspecified: Secondary | ICD-10-CM | POA: Diagnosis not present

## 2015-11-26 DIAGNOSIS — R197 Diarrhea, unspecified: Secondary | ICD-10-CM

## 2015-11-26 LAB — CLOSTRIDIUM DIFFICILE BY PCR: Toxigenic C. Difficile by PCR: NOT DETECTED

## 2015-11-27 LAB — FECAL LACTOFERRIN, QUANT: LACTOFERRIN: NEGATIVE

## 2015-12-02 ENCOUNTER — Telehealth: Payer: Self-pay | Admitting: *Deleted

## 2015-12-02 ENCOUNTER — Other Ambulatory Visit: Payer: Self-pay | Admitting: Family Medicine

## 2015-12-02 ENCOUNTER — Other Ambulatory Visit: Payer: Self-pay | Admitting: Physical Medicine & Rehabilitation

## 2015-12-02 LAB — FECAL FAT, QUALITATIVE: Fecal Fat Qualitative: NORMAL

## 2015-12-02 NOTE — Telephone Encounter (Signed)
I spoke with Olin Hauser and she is taking the 100 mg bid and 300 mg tid of the gabapentin.  [facility] notified.

## 2015-12-02 NOTE — Telephone Encounter (Signed)
Question about the dose on gabapentin.  I let them know we would need to followup on that with [name] and get back with them as to the 300mg  dose and 100mg  dose.  Waiting on call back from [facility] to tell us what she is taking.

## 2015-12-07 ENCOUNTER — Encounter: Payer: Self-pay | Admitting: Family Medicine

## 2015-12-07 ENCOUNTER — Ambulatory Visit (INDEPENDENT_AMBULATORY_CARE_PROVIDER_SITE_OTHER): Payer: Medicare Other | Admitting: Family Medicine

## 2015-12-07 VITALS — Ht 66.0 in | Wt 156.0 lb

## 2015-12-07 DIAGNOSIS — F17200 Nicotine dependence, unspecified, uncomplicated: Secondary | ICD-10-CM | POA: Diagnosis not present

## 2015-12-07 DIAGNOSIS — E785 Hyperlipidemia, unspecified: Secondary | ICD-10-CM | POA: Diagnosis not present

## 2015-12-07 DIAGNOSIS — R159 Full incontinence of feces: Secondary | ICD-10-CM

## 2015-12-07 DIAGNOSIS — K591 Functional diarrhea: Secondary | ICD-10-CM

## 2015-12-07 DIAGNOSIS — M5412 Radiculopathy, cervical region: Secondary | ICD-10-CM

## 2015-12-07 MED ORDER — ZOLPIDEM TARTRATE 5 MG PO TABS: 5.0000 mg | ORAL_TABLET | Freq: Every evening | ORAL | Status: DC | PRN

## 2015-12-07 MED ORDER — MESALAMINE 1.2 G PO TBEC: 3.6000 g | DELAYED_RELEASE_TABLET | Freq: Every day | ORAL | Status: DC

## 2015-12-07 MED ORDER — MECLIZINE HCL 25 MG PO TABS: ORAL_TABLET | ORAL | Status: DC

## 2015-12-07 MED ORDER — ALPRAZOLAM 1 MG PO TABS: ORAL_TABLET | ORAL | Status: DC

## 2015-12-07 MED ORDER — FEXOFENADINE HCL 180 MG PO TABS: 180.0000 mg | ORAL_TABLET | Freq: Every day | ORAL | Status: DC | PRN

## 2015-12-07 MED ORDER — ESTROGENS, CONJUGATED 0.625 MG/GM VA CREA: TOPICAL_CREAM | Freq: Every day | VAGINAL | Status: DC

## 2015-12-07 MED ORDER — ESCITALOPRAM OXALATE 20 MG PO TABS: 20.0000 mg | ORAL_TABLET | Freq: Every day | ORAL | Status: DC

## 2015-12-07 MED ORDER — OMEPRAZOLE 20 MG PO CPDR: DELAYED_RELEASE_CAPSULE | ORAL | Status: DC

## 2015-12-07 NOTE — Progress Notes (Signed)
Subjective:  Patient ID: Cassandra [name], female    DOB: [birth year]  Age: 60 y.o. MRN: [id number]  CC: GAD and Insomnia   HPI ENESSA CHARITY presents for Meniere's - things get messed up (moving0.) and gets nausea  GAD 7 : Generalized Anxiety Score 12/07/2015  Nervous, Anxious, on Edge 0  Control/stop worrying 1  Worry too much - different things 1  Trouble relaxing 3  Restless 1  Easily annoyed or irritable 1  Afraid - awful might happen 0  Total GAD 7 Score 7  Anxiety Difficulty Somewhat difficult    Trouble relaxing - my mind is busy - like a hamster on a wheel.  Stopped trazodone groggy, fell, exacerbated Meniere's, hard to swallow.  GI sx now quiescent with lialda.(Abd pain,fecal incontinence and diarrhea) Would like to continue.     History Merlene has a past medical history of Nicotine addiction; Hyperlipidemia; Chronic left shoulder pain; Tubular adenoma of colon (01/31/2000); Maternal complication related to childbirth; Skin cancer; Anxiety; Depression; COPD (chronic obstructive pulmonary disease) ([place]); Bowel habit changes; Meniere's disease; Chronic pain; and Rapid or irregular heartbeat.   She has past surgical history that includes Bladder repair; Shoulder arthroscopy; Abdominal hysterectomy; Knee arthroscopy (Left); Cesarean section; and ruptured uterus.   Her family history includes COPD in her brother; Colon polyps in her sister; Coronary artery disease in her brother, father, and mother; Diabetes in her mother; Heart disease in her sister; Kidney disease in her mother; Liver cancer in her maternal grandmother; Liver disease in her maternal grandmother; Lung cancer in her paternal grandmother; Lung disease in her father; Polymyalgia rheumatica in her sister; Prostate cancer in her father.She reports that she quit smoking about 6 months ago. Her smoking use included Cigarettes. She smoked 1.00 pack per day. She has never used smokeless tobacco. She reports that she does  not drink alcohol or use illicit drugs.    ROS Review of Systems  Constitutional: Negative for fever, activity change and appetite change.  HENT: Negative for congestion, rhinorrhea and sore throat.   Eyes: Negative for visual disturbance.  Respiratory: Negative for cough and shortness of breath.   Cardiovascular: Negative for chest pain and palpitations.  Gastrointestinal: Negative for nausea, abdominal pain and diarrhea.  Genitourinary: Negative for dysuria.  Musculoskeletal: Negative for myalgias and arthralgias.    Objective:  Ht 5\' 6"  (1.676 m)  Wt 156 lb (70.761 kg)  BMI 25.19 kg/m2  BP Readings from Last 3 Encounters:  11/19/15 100/62  10/30/15 123/75  10/23/15 107/63    Wt Readings from Last 3 Encounters:  12/07/15 156 lb (70.761 kg)  11/19/15 152 lb (68.947 kg)  10/23/15 155 lb (70.308 kg)     Physical Exam  Constitutional: She is oriented to person, place, and time. She appears well-developed and well-nourished. No distress.  HENT:  Head: Normocephalic and atraumatic.  Right Ear: External ear normal.  Left Ear: External ear normal.  Nose: Nose normal.  Mouth/Throat: Oropharynx is clear and moist.  Eyes: Conjunctivae and EOM are normal. Pupils are equal, round, and reactive to light.  Neck: Normal range of motion. Neck supple. No thyromegaly present.  Cardiovascular: Normal rate, regular rhythm and normal heart sounds.   No murmur heard. Pulmonary/Chest: Effort normal and breath sounds normal. No respiratory distress. She has no wheezes. She has no rales.  Abdominal: Soft. Bowel sounds are normal. She exhibits no distension. There is no tenderness.  Lymphadenopathy:    She has no cervical adenopathy.  Neurological:  She is alert and oriented to person, place, and time. She has normal reflexes.  Skin: Skin is warm and dry.  Psychiatric: She has a normal mood and affect. Her behavior is normal. Judgment and thought content normal.     Lab Results    Component Value Date   WBC 7.8 09/23/2015   HGB 14.7 12/10/2013   HCT 41.0 09/23/2015   PLT 326 09/23/2015   GLUCOSE 86 09/23/2015   CHOL 157 12/10/2013   TRIG 71 12/10/2013   HDL 64 12/10/2013   LDLCALC 79 12/10/2013   ALT 9 09/23/2015   AST 18 09/23/2015   NA 142 09/23/2015   K 5.0 09/23/2015   CL 99 09/23/2015   CREATININE 0.98 09/23/2015   BUN 11 09/23/2015   CO2 25 09/23/2015   TSH 0.808 12/10/2013    Mr Pelvis Wo Contrast  11/26/2015  CLINICAL DATA:  60 year old female with history of 2 years of bowel incontinence and pain in the bladder for the past 2 months. History of prior bladder sling surgery 10 years ago. EXAM: MRI PELVIS TECHNIQUE: Multiplanar multisequence MR imaging of the pelvis was performed. No intravenous contrast was administered. COMPARISON:  No priors. FINDINGS: At rest, the levator ani musculature shows no significant thinning or defects. Measurements at rest: H line:  5.5 cm M line: 3.7 cm Measurements during maximum strain/defecation: H line: 5.2 cm M line: 4.4 cm ANTERIOR COMPARTMENT Bladder base descent below PCL: 1.2 cm Cystocele grade: Mild 1-3cm; Moderate 3-6cm; Severe >6cm Anterior urethral hypermobility >30 degrees: Urethra poorly visualized on today's examination. Anterior aspect of the urinary bladder appears tethered, presumably from prior bladder sling surgery. MIDDLE COMPARTMENT Vaginal apex/cervix descent below PCL: None (vaginal apex 0.7 cm above the PCL ) Grade:  Mild 0-3cm; Moderate 3-6cm; Severe >6cm Enterocele or Mesenterocele:  None Grade:  Mild 0-3cm; Moderate 3-6cm; Severe >6cm POSTERIOR COMPARTMENT Rectocele: None Levator plate angle 075-GRM degrees from PCL:  No - Yes Other findings during maximum strain/defecation: None IMPRESSION: 1. Mild anterior compartment prolapse with mild cystocele during maximal strain. 2. Apparent tethering of the anterior aspect of the urinary bladder, presumably from prior bladder sling surgery. Electronically Signed    By: Vinnie Langton M.D.   On: 11/26/2015 10:27    Assessment & Plan:   Kwanzaa was seen today for gad and insomnia.  Diagnoses and all orders for this visit:  Hyperlipidemia  C7 radiculopathy  Nicotine dependence, uncomplicated, unspecified nicotine product type  Encopresis  Functional diarrhea  Other orders -     ALPRAZolam (XANAX) 1 MG tablet; Take one tablet twice daily for Meniere's Disease -     zolpidem (AMBIEN) 5 MG tablet; Take 1 tablet (5 mg total) by mouth at bedtime as needed. -     conjugated estrogens (PREMARIN) vaginal cream; Place vaginally daily. -     escitalopram (LEXAPRO) 20 MG tablet; Take 1 tablet (20 mg total) by mouth at bedtime. -     fexofenadine (ALLEGRA) 180 MG tablet; Take 1 tablet (180 mg total) by mouth daily as needed for allergies. -     meclizine (ANTIVERT) 25 MG tablet; TAKE 1 TABLET (25 MG TOTAL) BY MOUTH 2 (TWO) TIMES DAILY AS NEEDED. -     mesalamine (LIALDA) 1.2 g EC tablet; Take 3 tablets (3.6 g total) by mouth daily. -     omeprazole (PRILOSEC) 20 MG capsule; TAKE 1 CAPSULE (20 MG TOTAL) BY MOUTH DAILY.     I have discontinued Ms. Stagner's triamcinolone  ointment. I have also changed her zolpidem and fexofenadine. Additionally, I am having her maintain her Vitamin D, lidocaine, gabapentin, oxyCODONE-acetaminophen, methocarbamol, naproxen, gabapentin, ALPRAZolam, conjugated estrogens, escitalopram, meclizine, mesalamine, and omeprazole.  Meds ordered this encounter  Medications  . ALPRAZolam (XANAX) 1 MG tablet    Sig: Take one tablet twice daily for Meniere's Disease    Dispense:  60 tablet    Refill:  5  . zolpidem (AMBIEN) 5 MG tablet    Sig: Take 1 tablet (5 mg total) by mouth at bedtime as needed.    Dispense:  30 tablet    Refill:  5    Not to exceed 4 additional fills before 02/06/2016  . conjugated estrogens (PREMARIN) vaginal cream    Sig: Place vaginally daily.    Dispense:  42.5 g    Refill:  5  . escitalopram (LEXAPRO)  20 MG tablet    Sig: Take 1 tablet (20 mg total) by mouth at bedtime.    Dispense:  90 tablet    Refill:  1  . fexofenadine (ALLEGRA) 180 MG tablet    Sig: Take 1 tablet (180 mg total) by mouth daily as needed for allergies.    Dispense:  90 tablet    Refill:  3  . meclizine (ANTIVERT) 25 MG tablet    Sig: TAKE 1 TABLET (25 MG TOTAL) BY MOUTH 2 (TWO) TIMES DAILY AS NEEDED.    Dispense:  20 tablet    Refill:  2  . mesalamine (LIALDA) 1.2 g EC tablet    Sig: Take 3 tablets (3.6 g total) by mouth daily.    Dispense:  90 tablet    Refill:  1  . omeprazole (PRILOSEC) 20 MG capsule    Sig: TAKE 1 CAPSULE (20 MG TOTAL) BY MOUTH DAILY.    Dispense:  30 capsule    Refill:  4     Follow-up: Return in about 6 months (around 06/08/2016).  [name], M.D.

## 2015-12-09 ENCOUNTER — Encounter (HOSPITAL_COMMUNITY): Payer: Self-pay | Admitting: *Deleted

## 2015-12-09 ENCOUNTER — Ambulatory Visit (HOSPITAL_COMMUNITY)
Admission: AD | Admit: 2015-12-09 | Discharge: 2015-12-09 | Disposition: A | Discharge status: Home / Self Care | Payer: Medicare Other | Source: Ambulatory Visit | Attending: Gastroenterology | Admitting: Gastroenterology

## 2015-12-09 ENCOUNTER — Encounter (HOSPITAL_COMMUNITY)
Admission: AD | Disposition: A | Discharge status: Home / Self Care | Payer: Self-pay | Source: Ambulatory Visit | Attending: Gastroenterology

## 2015-12-09 DIAGNOSIS — M6281 Muscle weakness (generalized): Secondary | ICD-10-CM | POA: Insufficient documentation

## 2015-12-09 DIAGNOSIS — R159 Full incontinence of feces: Secondary | ICD-10-CM | POA: Insufficient documentation

## 2015-12-09 DIAGNOSIS — K9289 Other specified diseases of the digestive system: Secondary | ICD-10-CM | POA: Insufficient documentation

## 2015-12-09 DIAGNOSIS — K5902 Outlet dysfunction constipation: Secondary | ICD-10-CM

## 2015-12-09 HISTORY — PX: ANAL RECTAL MANOMETRY: SHX6358

## 2015-12-09 SURGERY — MANOMETRY, ANORECTAL

## 2015-12-10 NOTE — Progress Notes (Signed)
Anorectal manometry done per protocol with balloon expulsion test.  Patient tolerated will with no complications.  Report sent to Dr. Harl Bowie.

## 2015-12-11 ENCOUNTER — Other Ambulatory Visit: Payer: Self-pay | Admitting: Physical Medicine & Rehabilitation

## 2015-12-11 ENCOUNTER — Encounter (HOSPITAL_COMMUNITY): Payer: Self-pay | Admitting: Gastroenterology

## 2015-12-11 ENCOUNTER — Ambulatory Visit: Payer: Medicare Other | Admitting: Physical Therapy

## 2015-12-11 ENCOUNTER — Other Ambulatory Visit: Payer: Self-pay | Admitting: Family Medicine

## 2015-12-11 DIAGNOSIS — K5902 Outlet dysfunction constipation: Secondary | ICD-10-CM | POA: Insufficient documentation

## 2015-12-11 DIAGNOSIS — R159 Full incontinence of feces: Secondary | ICD-10-CM | POA: Insufficient documentation

## 2015-12-14 ENCOUNTER — Ambulatory Visit: Payer: Medicare Other | Admitting: Physical Therapy

## 2015-12-14 ENCOUNTER — Telehealth: Payer: Self-pay | Admitting: Gastroenterology

## 2015-12-15 NOTE — Telephone Encounter (Signed)
Patient reports Dr Livia Snellen put her on lialda. She has had improvement in her symptoms. She is having 5 bm's daily which is down from about 12. She reports no stool incontinence recently. 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 She is concerned that PT will not be different or beneficial. She has a high co-pay for specialists. Her evaluation is this week.

## 2015-12-15 NOTE — Telephone Encounter (Signed)
She can check with her insurance regarding the co-pays as sometimes they're different for physical therapy and may have no co-pays and she we'll need to contact the physical therapy office as well. It's usually more than kegals and would benefit from doing atleast few sessions of PT

## 2015-12-16 ENCOUNTER — Other Ambulatory Visit: Payer: Self-pay

## 2015-12-16 NOTE — Telephone Encounter (Signed)
Discussed this with the patient.

## 2015-12-17 ENCOUNTER — Ambulatory Visit: Payer: Medicare Other | Admitting: Physical Therapy

## 2015-12-22 ENCOUNTER — Ambulatory Visit: Payer: Medicare Other | Admitting: Registered Nurse

## 2015-12-23 ENCOUNTER — Encounter: Payer: Medicare Other | Admitting: Registered Nurse

## 2015-12-28 ENCOUNTER — Encounter: Payer: Medicare Other | Attending: Physical Medicine & Rehabilitation | Admitting: Physical Medicine & Rehabilitation

## 2015-12-28 ENCOUNTER — Encounter: Payer: Self-pay | Admitting: Physical Medicine & Rehabilitation

## 2015-12-28 VITALS — BP 144/87 | HR 77

## 2015-12-28 DIAGNOSIS — M7631 Iliotibial band syndrome, right leg: Secondary | ICD-10-CM | POA: Insufficient documentation

## 2015-12-28 DIAGNOSIS — Z79899 Other long term (current) drug therapy: Secondary | ICD-10-CM | POA: Insufficient documentation

## 2015-12-28 DIAGNOSIS — M7918 Myalgia, other site: Secondary | ICD-10-CM

## 2015-12-28 DIAGNOSIS — M47812 Spondylosis without myelopathy or radiculopathy, cervical region: Secondary | ICD-10-CM

## 2015-12-28 DIAGNOSIS — M7502 Adhesive capsulitis of left shoulder: Secondary | ICD-10-CM

## 2015-12-28 DIAGNOSIS — M791 Myalgia: Secondary | ICD-10-CM | POA: Diagnosis not present

## 2015-12-28 DIAGNOSIS — M25512 Pain in left shoulder: Secondary | ICD-10-CM | POA: Diagnosis not present

## 2015-12-28 DIAGNOSIS — M542 Cervicalgia: Secondary | ICD-10-CM | POA: Insufficient documentation

## 2015-12-28 DIAGNOSIS — G8929 Other chronic pain: Secondary | ICD-10-CM | POA: Insufficient documentation

## 2015-12-28 DIAGNOSIS — M19012 Primary osteoarthritis, left shoulder: Secondary | ICD-10-CM

## 2015-12-28 DIAGNOSIS — G894 Chronic pain syndrome: Secondary | ICD-10-CM

## 2015-12-28 DIAGNOSIS — F411 Generalized anxiety disorder: Secondary | ICD-10-CM | POA: Diagnosis present

## 2015-12-28 DIAGNOSIS — Z5181 Encounter for therapeutic drug level monitoring: Secondary | ICD-10-CM | POA: Insufficient documentation

## 2015-12-28 DIAGNOSIS — M7501 Adhesive capsulitis of right shoulder: Secondary | ICD-10-CM | POA: Diagnosis not present

## 2015-12-28 DIAGNOSIS — K589 Irritable bowel syndrome without diarrhea: Secondary | ICD-10-CM | POA: Insufficient documentation

## 2015-12-28 MED ORDER — OXYCODONE-ACETAMINOPHEN 5-325 MG PO TABS: 1.0000 | ORAL_TABLET | Freq: Three times a day (TID) | ORAL | Status: DC | PRN

## 2015-12-28 NOTE — Progress Notes (Signed)
Subjective:    Patient ID: Cassandra [name], female    DOB: [birth year], 60 y.o.   MRN: [id number]  HPI   Cassandra [name] is here in follow up of her chronic pain. She has had improvement in her mood since increasing the lexapro.   She has had loose stool for about 3 months (as opposed to her chronic constipation). Her work up has essentially been negative. She did have an MRI which showed some uterine prolapse/post-sling changes.   She used the TENS and with the lower intensity had good relief.   Pain Inventory Average Pain 7 Pain Right Now 7 My pain is sharp, burning, dull, stabbing, tingling and aching  In the last 24 hours, has pain interfered with the following? General activity 9 Relation with others 9 Enjoyment of life 9 What TIME of day is your pain at its worst? morning and night Sleep (in general) Poor  Pain is worse with: bending, sitting, standing and some activites Pain improves with: heat/ice, pacing activities, medication and TENS Relief from Meds: 5  Mobility walk without assistance use a cane ability to climb steps?  yes do you drive?  yes  Function disabled: date disabled 2007 I need assistance with the following:  dressing, household duties and shopping  Neuro/Psych bladder control problems bowel control problems weakness numbness tremor tingling trouble walking spasms dizziness confusion depression anxiety  Prior Studies Any changes since last visit?  yes GI issues  Physicians involved in your care Any changes since last visit?  no   Family History  Problem Relation Age of Onset  . Coronary artery disease Mother   . Diabetes Mother     borderline  . Kidney disease Mother     stage 4  . Coronary artery disease Father   . Prostate cancer Father   . Lung disease Father     black lung  . Liver cancer Maternal Grandmother   . Liver disease Maternal Grandmother   . Colon cancer      Both sides  . Polymyalgia rheumatica Sister   . Heart  disease Sister   . Coronary artery disease Brother     heart attack  . COPD Brother   . Colon polyps Sister   . Lung cancer Paternal Grandmother    Social History   Social History  . Marital Status: Married    Spouse Name: N/A  . Number of Children: 1  . Years of Education: N/A   Occupational History  . Homemaker    Social History Main Topics  . Smoking status: Former Smoker -- 1.00 packs/day    Types: Cigarettes    Quit date: 06/01/2015  . Smokeless tobacco: Never Used     Comment: would like to try chantix, but has not started on it due to warnings related to depression  . Alcohol Use: No  . Drug Use: No  . Sexual Activity: Yes   Other Topics Concern  . None   Social History Narrative   Past Surgical History  Procedure Laterality Date  . Bladder repair      sling/with perforation  . Shoulder arthroscopy      multiple  . Abdominal hysterectomy      age 56  . Knee arthroscopy Left   . Cesarean section      1 time  . Ruptured uterus    . Anal rectal manometry N/A 12/09/2015    Procedure: ANO RECTAL MANOMETRY;  Surgeon: Mauri Pole, MD;  Location: [facility];  Service: Endoscopy;  Laterality: N/A;   Past Medical History  Diagnosis Date  . Nicotine addiction     2 packs per day   . Hyperlipidemia   . Chronic left shoulder pain     frozen   . Tubular adenoma of colon 01/31/2000    Dr. Richmond Campbell  . Maternal complication related to childbirth     Sepsis, staph  . Skin cancer     Rt. leg, squamous cell  . Anxiety   . Depression   . COPD (chronic obstructive pulmonary disease) ([place])     Dr. Redge Gainer  . Bowel habit changes     excessive gas /frequent BM's with change in shape with pus  . Meniere's disease   . Chronic pain     left upper shoulder ,neck and back  . Rapid or irregular heartbeat    BP 144/87 mmHg  Pulse 77  SpO2 99%  Opioid Risk Score:   Fall Risk Score:  `1  Depression screen PHQ 2/9  Depression screen [facility] 2/9  12/28/2015 12/07/2015 06/04/2015 03/09/2015 01/13/2015 12/15/2014 10/01/2014  Decreased Interest 2 0 2 2 1 2 2   Down, Depressed, Hopeless 1 0 1 1 1 2 2   PHQ - 2 Score 3 0 3 3 2 4 4   Altered sleeping - - - 3 1 3 3   Tired, decreased energy - - - 3 2 3 3   Change in appetite - - - 3 1 3 3   Feeling bad or failure about yourself  - - - 0 1 2 1   Trouble concentrating - - - 2 1 3 2   Moving slowly or fidgety/restless - - - 2 1 3 2   Suicidal thoughts - - - 0 0 0 1  PHQ-9 Score - - - 16 9 21 19   Difficult doing work/chores - - - - Somewhat difficult - -     Review of Systems  Constitutional: Positive for appetite change and unexpected weight change.  Gastrointestinal: Positive for nausea, vomiting, abdominal pain and diarrhea.  Genitourinary: Positive for difficulty urinating.  All other systems reviewed and are negative.      Objective:   Physical Exam  General: Alert and oriented x 3, No apparent distress. Appears to be in good physical condition  HEENT: Head is normocephalic, atraumatic, PERRLA, EOMI, sclera anicteric, oral mucosa pink and moist, dentition intact, ext ear canals clear,  Neck: Supple without JVD or lymphadenopathy  Heart: Reg rate and rhythm. No murmurs rubs or gallops  Chest: CTA bilaterally without wheezes, rales, or rhonchi; no distress  Abdomen: Soft, non-tender, non-distended, bowel sounds positive.  Extremities: No clubbing, cyanosis, or edema. Pulses are 2+  Skin: Clean and intact without signs of breakdown. Area of shoulder surgery noted.  Neuro: Pt is cognitively appropriate with normal insight, memory, and awareness. Cranial nerves 2-12 are intact. Sensory exam is normal. Reflexes are 2+ in all 4's. Fine motor coordination is intact. No tremors. Motor function is grossly 5/5.  Musculoskeletal: left shoulder limited in ROM. Area is NOT alodynic. She has spasm along the left trap and SCM. Shoulder girdle positioning and head posture is fair.Marland Kitchen Spurling's test negative again.   Psych: Pt's affect is appropriate. Pt is cooperative and pleasant as always     Assessment & Plan:   1. Post-traumatic arthritis left shoulder s/p 2 shoulder surgeries. Now with adhesive capsulitis left shoulder.  2. Cervicalgia, spondylosis.  3. Right ITB syndrome  4. Depression/insomnia/anxiety  5. Continued loose stool. ?IBS  Plan:  1. She will try OTC TENS---still can consider PENS trial  2. Robaxin for muscle spasms  3. Gabapentin continued 4. Naproxen 500mg  bid  5. ITB stretching and exercises  6. Refilled percocet for breakthrough pain.  7. Continue traction/posture exercises for neck as we described.  8. Lexapro for depression/anxiety at 20mg  daily 9. Probiotic for bowels 10.  I or NP will see her back in about 2 month. 30 minutes of face to face patient care time were spent during this visit. All questions were encouraged and answered.

## 2015-12-28 NOTE — Patient Instructions (Signed)
PROBIOTIC FOR BOWELS DAILY AS DIRECTED ON BOTTLE

## 2015-12-30 ENCOUNTER — Other Ambulatory Visit: Payer: Self-pay | Admitting: Family Medicine

## 2016-01-10 ENCOUNTER — Emergency Department (HOSPITAL_COMMUNITY)
Admission: EM | Admit: 2016-01-10 | Discharge: 2016-01-10 | Disposition: A | Discharge status: Home / Self Care | Payer: Medicare Other | Attending: Emergency Medicine | Admitting: Emergency Medicine

## 2016-01-10 ENCOUNTER — Encounter (HOSPITAL_COMMUNITY): Payer: Self-pay | Admitting: Emergency Medicine

## 2016-01-10 DIAGNOSIS — F329 Major depressive disorder, single episode, unspecified: Secondary | ICD-10-CM | POA: Insufficient documentation

## 2016-01-10 DIAGNOSIS — L0291 Cutaneous abscess, unspecified: Secondary | ICD-10-CM

## 2016-01-10 DIAGNOSIS — L02211 Cutaneous abscess of abdominal wall: Secondary | ICD-10-CM | POA: Diagnosis not present

## 2016-01-10 DIAGNOSIS — Z87891 Personal history of nicotine dependence: Secondary | ICD-10-CM | POA: Insufficient documentation

## 2016-01-10 DIAGNOSIS — J449 Chronic obstructive pulmonary disease, unspecified: Secondary | ICD-10-CM | POA: Insufficient documentation

## 2016-01-10 DIAGNOSIS — E785 Hyperlipidemia, unspecified: Secondary | ICD-10-CM | POA: Diagnosis not present

## 2016-01-10 MED ORDER — HYDROCODONE-ACETAMINOPHEN 5-325 MG PO TABS: 1.0000 | ORAL_TABLET | Freq: Four times a day (QID) | ORAL | Status: DC | PRN

## 2016-01-10 MED ORDER — LORAZEPAM 2 MG/ML IJ SOLN
1.0000 mg | Freq: Once | INTRAMUSCULAR | Status: AC
  Administered 2016-01-10: 1 mg via INTRAVENOUS
  Filled 2016-01-10: qty 1

## 2016-01-10 MED ORDER — HYDROMORPHONE HCL 1 MG/ML IJ SOLN
1.0000 mg | Freq: Once | INTRAMUSCULAR | Status: AC
  Administered 2016-01-10: 1 mg via INTRAVENOUS
  Filled 2016-01-10: qty 1

## 2016-01-10 MED ORDER — VANCOMYCIN HCL IN DEXTROSE 1-5 GM/200ML-% IV SOLN
1000.0000 mg | Freq: Once | INTRAVENOUS | Status: AC
  Administered 2016-01-10: 1000 mg via INTRAVENOUS
  Filled 2016-01-10: qty 200

## 2016-01-10 MED ORDER — LIDOCAINE-EPINEPHRINE (PF) 2 %-1:200000 IJ SOLN
10.0000 mL | Freq: Once | INTRAMUSCULAR | Status: DC
  Filled 2016-01-10: qty 20

## 2016-01-10 MED ORDER — DOXYCYCLINE HYCLATE 100 MG PO CAPS: 100.0000 mg | ORAL_CAPSULE | Freq: Two times a day (BID) | ORAL | Status: DC

## 2016-01-10 NOTE — ED Notes (Signed)
Pt states she has several abscesses. 1 under her left axilla, 1 (bump) on her right pinky finger, and 2 near her vagina.

## 2016-01-10 NOTE — ED Provider Notes (Signed)
CSN: [id number]     Arrival date & time 01/10/16  1854 History   First MD Initiated Contact with Patient 01/10/16 1909     Chief Complaint  Patient presents with  . Abscess     (Consider location/radiation/quality/duration/timing/severity/associated sxs/prior Treatment) Patient is a 60 y.o. female presenting with abscess. The history is provided by the patient (Patient complains of sores to her lower abdomen left axilla and right small finger.).  Abscess Abscess location: Right and left lower abdomen left axilla and right fifth finger. Abscess quality: not draining   Red streaking: yes   Progression:  Worsening Chronicity:  New Context: diabetes   Relieved by:  Nothing Worsened by:  Nothing tried Associated symptoms: no fatigue and no headaches     Past Medical History  Diagnosis Date  . Nicotine addiction     2 packs per day   . Hyperlipidemia   . Chronic left shoulder pain     frozen   . Tubular adenoma of colon 01/31/2000    Dr. Richmond Campbell  . Maternal complication related to childbirth     Sepsis, staph  . Skin cancer     Rt. leg, squamous cell  . Anxiety   . Depression   . COPD (chronic obstructive pulmonary disease) ([place])     Dr. Redge Gainer  . Bowel habit changes     excessive gas /frequent BM's with change in shape with pus  . Meniere's disease   . Chronic pain     left upper shoulder ,neck and back  . Rapid or irregular heartbeat    Past Surgical History  Procedure Laterality Date  . Bladder repair      sling/with perforation  . Shoulder arthroscopy      multiple  . Abdominal hysterectomy      age 65  . Knee arthroscopy Left   . Cesarean section      1 time  . Ruptured uterus    . Anal rectal manometry N/A 12/09/2015    Procedure: ANO RECTAL MANOMETRY;  Surgeon: Mauri Pole, MD;  Location: [facility];  Service: Endoscopy;  Laterality: N/A;   Family History  Problem Relation Age of Onset  . Coronary artery disease Mother   . Diabetes  Mother     borderline  . Kidney disease Mother     stage 4  . Coronary artery disease Father   . Prostate cancer Father   . Lung disease Father     black lung  . Liver cancer Maternal Grandmother   . Liver disease Maternal Grandmother   . Colon cancer      Both sides  . Polymyalgia rheumatica Sister   . Heart disease Sister   . Coronary artery disease Brother     heart attack  . COPD Brother   . Colon polyps Sister   . Lung cancer Paternal Grandmother    Social History  Substance Use Topics  . Smoking status: Former Smoker -- 1.00 packs/day    Types: Cigarettes    Quit date: 06/01/2015  . Smokeless tobacco: Never Used     Comment: would like to try chantix, but has not started on it due to warnings related to depression  . Alcohol Use: No   OB History    No data available     Review of Systems  Constitutional: Negative for appetite change and fatigue.  HENT: Negative for congestion, ear discharge and sinus pressure.   Eyes: Negative for discharge.  Respiratory: Negative  for cough.   Cardiovascular: Negative for chest pain.  Gastrointestinal: Negative for abdominal pain and diarrhea.  Genitourinary: Negative for frequency and hematuria.  Musculoskeletal: Negative for back pain.  Skin: Positive for pallor. Negative for rash.  Neurological: Negative for seizures and headaches.  Psychiatric/Behavioral: Negative for hallucinations.      Allergies  Chantix; Codeine; Cymbalta; Livalo; Metoprolol; Oxycodone-aspirin; Oxycontin; Propoxyphene n-acetaminophen; Trazodone and nefazodone; Zithromax; Other; and Oxycodone-acetaminophen  Home Medications    BP 147/68 mmHg  Pulse 79  Temp(Src) 97.9 F (36.6 C) (Oral)  Resp 18  Ht 5' 6.5" (1.689 m)  Wt 152 lb (68.947 kg)  BMI 24.17 kg/m2  SpO2 100% Physical Exam  Constitutional: She is oriented to person, place, and time. She appears well-developed.  HENT:  Head: Normocephalic.  Eyes: Conjunctivae and EOM are normal.  No scleral icterus.  Neck: Neck supple. No thyromegaly present.  Cardiovascular: Normal rate and regular rhythm.  Exam reveals no gallop and no friction rub.   No murmur heard. Pulmonary/Chest: No stridor. She has no wheezes. She has no rales. She exhibits no tenderness.  Abdominal: She exhibits no distension. There is tenderness. There is no rebound.  Patient has 2 abscesses on her abdomen right lower quadrant and left lower quadrant  Musculoskeletal: Normal range of motion. She exhibits no edema.  Lymphadenopathy:    She has no cervical adenopathy.  Neurological: She is oriented to person, place, and time. She exhibits normal muscle tone. Coordination normal.  Skin: Rash noted. No erythema.  Small skin infections to right fifth finger and left axilla that do not need to be drained  Psychiatric: She has a normal mood and affect. Her behavior is normal.    ED Course  .Marland KitchenIncision and Drainage Date/Time: 01/10/2016 8:58 PM Performed by: Milton Ferguson Authorized by: Milton Ferguson Comments: Patient had 2 abscesses to her lower abdomen. Both of them were cleaned thoroughly with Betadine and numbed with 2% lidocaine without epi. A #11 blade was used to make an incision. Pus came out of both abscesses. Cultures were done. Iodoform gauze was used to pack both abscesses   (including critical care time) Labs Review Labs Reviewed  WOUND CULTURE    Imaging Review No results found. I have personally reviewed and evaluated these images and lab results as part of my medical decision-making.   EKG Interpretation None      MDM   Final diagnoses:  Abscess    Skin abscesses. Patient put on doxycycline given Vicodin and will follow-up with her doctor in 2 days    Milton Ferguson, MD 01/10/16 2101

## 2016-01-10 NOTE — Discharge Instructions (Signed)
Follow up with your md Tuesday as planned °

## 2016-01-12 ENCOUNTER — Encounter: Payer: Self-pay | Admitting: Family Medicine

## 2016-01-12 ENCOUNTER — Ambulatory Visit (INDEPENDENT_AMBULATORY_CARE_PROVIDER_SITE_OTHER): Payer: Medicare Other | Admitting: Family Medicine

## 2016-01-12 VITALS — BP 117/71 | HR 77 | Temp 97.1°F | Ht 65.5 in | Wt 151.2 lb

## 2016-01-12 DIAGNOSIS — L02211 Cutaneous abscess of abdominal wall: Secondary | ICD-10-CM | POA: Diagnosis not present

## 2016-01-12 DIAGNOSIS — L02519 Cutaneous abscess of unspecified hand: Secondary | ICD-10-CM | POA: Insufficient documentation

## 2016-01-12 DIAGNOSIS — L02511 Cutaneous abscess of right hand: Secondary | ICD-10-CM | POA: Diagnosis not present

## 2016-01-12 NOTE — Progress Notes (Signed)
Subjective:  Patient ID: Cassandra [name], female    DOB: 07-29-1956  Age: 60 y.o. MRN: [id number]  CC: Hospitalization Follow-up   HPI SAGA HALPAIN presents for Recheck of abscessed wounds. She was seen in the emergency department 2 days ago and had 2 abscesses of the lower wall lanced. They were packed and she is back today to have the packing removed and replaced under supervision. Additionally there is a smaller abscess on the right fifth finger. This is red and swollen and in need of follow-up. The emergency department record is reviewed. It also mentions a left axillary abscess. The one on the finger and in the axilla were deemed not in need of being drained. Abscesses were lanced with sterile procedure and obtained pus. Cultures were sent. Patient was discharged on doxycycline and Vicodin. History Nassim has a past medical history of Nicotine addiction; Hyperlipidemia; Chronic left shoulder pain; Tubular adenoma of colon (01/31/2000); Maternal complication related to childbirth; Skin cancer; Anxiety; Depression; COPD (chronic obstructive pulmonary disease) ([place]); Bowel habit changes; Meniere's disease; Chronic pain; and Rapid or irregular heartbeat.   She has past surgical history that includes Bladder repair; Shoulder arthroscopy; Abdominal hysterectomy; Knee arthroscopy (Left); Cesarean section; ruptured uterus; and Anal Rectal manometry (N/A, 12/09/2015).   Her family history includes COPD in her brother; Colon polyps in her sister; Coronary artery disease in her brother, father, and mother; Diabetes in her mother; Heart disease in her sister; Kidney disease in her mother; Liver cancer in her maternal grandmother; Liver disease in her maternal grandmother; Lung cancer in her paternal grandmother; Lung disease in her father; Polymyalgia rheumatica in her sister; Prostate cancer in her father.She reports that she quit smoking about 7 months ago. Her smoking use included Cigarettes. She smoked 1.00  pack per day. She has never used smokeless tobacco. She reports that she does not drink alcohol or use illicit drugs.    ROS Review of Systems  Constitutional: Negative for fever, activity change and appetite change.  Gastrointestinal: Negative for nausea, abdominal pain and diarrhea.  Genitourinary: Negative for dysuria.  Musculoskeletal: Negative for myalgias and arthralgias.    Objective:  BP 117/71 mmHg  Pulse 77  Temp(Src) 97.1 F (36.2 C) (Oral)  Ht 5' 5.5" (1.664 m)  Wt 151 lb 3.2 oz (68.584 kg)  BMI 24.77 kg/m2  SpO2 98%  BP Readings from Last 3 Encounters:  01/12/16 117/71  01/10/16 147/68  12/28/15 144/87    Wt Readings from Last 3 Encounters:  01/12/16 151 lb 3.2 oz (68.584 kg)  01/10/16 152 lb (68.947 kg)  12/07/15 156 lb (70.761 kg)     Physical Exam  Constitutional: She appears well-developed and well-nourished.  HENT:  Head: Normocephalic.  Cardiovascular: Normal rate and regular rhythm.   No murmur heard. Pulmonary/Chest: Effort normal and breath sounds normal.  Abdominal: Soft. Bowel sounds are normal. She exhibits no distension. There is tenderness (two lower abd abscesses near the suprapubic region were noted to have mild erythema. Wicks protrude from what is apparently surgical incisions of 4 mm each).  Musculoskeletal: She exhibits tenderness.  Skin:  6 mm deep erythema on right 5th finger at midshaft. nonfluctuent.   The dressings were removed from the abscesses on the abd. There was mucopulent matter on the wicks. Sterile prep followed by placement of clean wicks into the abscess beds. Followed by dry dressings.  The finger was prepped and draped. Local with 2% lidocaine, Then lanced releasing several drops of sanguinopurulent matter. Sterile  dressing applied  Lab Results  Component Value Date   WBC 7.8 09/23/2015   HGB 14.7 12/10/2013   HCT 41.0 09/23/2015   PLT 326 09/23/2015   GLUCOSE 86 09/23/2015   CHOL 157 12/10/2013   TRIG 71  12/10/2013   HDL 64 12/10/2013   LDLCALC 79 12/10/2013   ALT 9 09/23/2015   AST 18 09/23/2015   NA 142 09/23/2015   K 5.0 09/23/2015   CL 99 09/23/2015   CREATININE 0.98 09/23/2015   BUN 11 09/23/2015   CO2 25 09/23/2015   TSH 0.808 12/10/2013    No results found.  Assessment & Plan:   Dorthie was seen today for hospitalization follow-up.  Diagnoses and all orders for this visit:  Abscess of abdominal wall  Abscess of fifth finger, right     I am having Ms. Copus maintain her lidocaine, gabapentin, methocarbamol, naproxen, gabapentin, ALPRAZolam, zolpidem, conjugated estrogens, escitalopram, fexofenadine, meclizine, mesalamine, omeprazole, oxyCODONE-acetaminophen, LIALDA, Olopatadine HCl, hydroxypropyl methylcellulose / hypromellose, doxycycline, and HYDROcodone-acetaminophen.  No orders of the defined types were placed in this encounter.     Follow-up: Return in about 3 days (around 01/15/2016), or if symptoms worsen or fail to improve.  [name], M.D.

## 2016-01-13 LAB — WOUND CULTURE

## 2016-01-14 ENCOUNTER — Telehealth: Payer: Self-pay | Admitting: *Deleted

## 2016-01-14 NOTE — ED Notes (Signed)
Post ED Visit - Positive Culture Follow-up  Culture report reviewed by antimicrobial stewardship pharmacist:  []  Cassandra [name], Pharm.D. []  Cassandra [name], Pharm.D., BCPS []  Cassandra [name], Pharm.D. []  Cassandra [name], Pharm.D., BCPS []  Cassandra [name], Pharm.D., BCPS, AAHIVP []  Cassandra [name], Pharm.D., BCPS, AAHIVP [x]  Cassandra [name], Pharm.D. []  Cassandra [name], Pharm.D.  Positive wound culture Treated with Doxycycline, organism sensitive to the same and no further patient follow-up is required at this time.  [name] 01/14/2016, 10:55 AM

## 2016-01-15 ENCOUNTER — Ambulatory Visit (INDEPENDENT_AMBULATORY_CARE_PROVIDER_SITE_OTHER): Payer: Medicare Other | Admitting: Family Medicine

## 2016-01-15 ENCOUNTER — Encounter: Payer: Self-pay | Admitting: Family Medicine

## 2016-01-15 VITALS — BP 124/77 | HR 77 | Temp 96.9°F | Ht 65.5 in | Wt 150.0 lb

## 2016-01-15 DIAGNOSIS — L02211 Cutaneous abscess of abdominal wall: Secondary | ICD-10-CM

## 2016-01-15 DIAGNOSIS — L02511 Cutaneous abscess of right hand: Secondary | ICD-10-CM

## 2016-01-15 NOTE — Progress Notes (Signed)
Subjective:  Patient ID: Cassandra [name], female    DOB: 1955-09-29  Age: 60 y.o. MRN: [id number]  CC: Abscess   HPI BEXLIE MERRICK presents for Recheck of abscessed wounds. She was seen 2 days ago and had 2 abscesses of the lower wall packed. she is back today to have the packing removed and replaced under supervision. Additionally there is a smaller abscess on the right fifth finger. Feels much better since lanced here 3 days ago.   History Miceala has a past medical history of Nicotine addiction; Hyperlipidemia; Chronic left shoulder pain; Tubular adenoma of colon (01/31/2000); Maternal complication related to childbirth; Skin cancer; Anxiety; Depression; COPD (chronic obstructive pulmonary disease) ([place]); Bowel habit changes; Meniere's disease; Chronic pain; and Rapid or irregular heartbeat.   She has past surgical history that includes Bladder repair; Shoulder arthroscopy; Abdominal hysterectomy; Knee arthroscopy (Left); Cesarean section; ruptured uterus; and Anal Rectal manometry (N/A, 12/09/2015).   Her family history includes COPD in her brother; Colon polyps in her sister; Coronary artery disease in her brother, father, and mother; Diabetes in her mother; Heart disease in her sister; Kidney disease in her mother; Liver cancer in her maternal grandmother; Liver disease in her maternal grandmother; Lung cancer in her paternal grandmother; Lung disease in her father; Polymyalgia rheumatica in her sister; Prostate cancer in her father.She reports that she quit smoking about 7 months ago. Her smoking use included Cigarettes. She smoked 1.00 pack per day. She has never used smokeless tobacco. She reports that she does not drink alcohol or use illicit drugs.    ROS Review of Systems  Constitutional: Negative for fever, activity change and appetite change.  Gastrointestinal: Negative for nausea, abdominal pain and diarrhea.  Genitourinary: Negative for dysuria.  Musculoskeletal: Negative for  myalgias and arthralgias.    Objective:  BP 124/77 mmHg  Pulse 77  Temp(Src) 96.9 F (36.1 C) (Oral)  Ht 5' 5.5" (1.664 m)  Wt 150 lb (68.04 kg)  BMI 24.57 kg/m2  BP Readings from Last 3 Encounters:  01/15/16 124/77  01/12/16 117/71  01/10/16 147/68    Wt Readings from Last 3 Encounters:  01/15/16 150 lb (68.04 kg)  01/12/16 151 lb 3.2 oz (68.584 kg)  01/10/16 152 lb (68.947 kg)     Physical Exam  Constitutional: She appears well-developed and well-nourished.  HENT:  Head: Normocephalic.  Cardiovascular: Normal rate and regular rhythm.   No murmur heard. Pulmonary/Chest: Effort normal and breath sounds normal.  Abdominal: Soft. Bowel sounds are normal. She exhibits no distension. There is tenderness (Incisions now free of erythema. Mild purulnec on wick removed from the left sided lesion.Right lesion wick not present. Wounds probed. Fresh packing inserted. ).  Musculoskeletal: She exhibits no tenderness.  Skin:  Right fifth finger lesion is improving, minimal erythema. No fluctuense or edema    The dressings were removed from the abscesses on the abd. There was mucopulent matter on the wicks. Sterile prep followed by placement of clean wicks into the abscess beds. Followed by dry dressings.  The finger was prepped and draped. Local with 2% lidocaine, Then lanced releasing several drops of sanguinopurulent matter. Sterile dressing applied  Lab Results  Component Value Date   WBC 7.8 09/23/2015   HGB 14.7 12/10/2013   HCT 41.0 09/23/2015   PLT 326 09/23/2015   GLUCOSE 86 09/23/2015   CHOL 157 12/10/2013   TRIG 71 12/10/2013   HDL 64 12/10/2013   LDLCALC 79 12/10/2013   ALT 9 09/23/2015  AST 18 09/23/2015   NA 142 09/23/2015   K 5.0 09/23/2015   CL 99 09/23/2015   CREATININE 0.98 09/23/2015   BUN 11 09/23/2015   CO2 25 09/23/2015   TSH 0.808 12/10/2013    No results found.  Assessment & Plan:   Kameryn was seen today for abscess.  Diagnoses and all  orders for this visit:  Abscess of abdominal wall  Abscess of fifth finger, right     I am having Ms. Staffa maintain her lidocaine, gabapentin, methocarbamol, naproxen, gabapentin, ALPRAZolam, zolpidem, conjugated estrogens, escitalopram, fexofenadine, meclizine, mesalamine, omeprazole, oxyCODONE-acetaminophen, LIALDA, Olopatadine HCl, hydroxypropyl methylcellulose / hypromellose, doxycycline, and HYDROcodone-acetaminophen.  No orders of the defined types were placed in this encounter.     Follow-up: Return in about 3 days (around 01/18/2016) for wound check.  [name], M.D.

## 2016-01-18 ENCOUNTER — Ambulatory Visit (INDEPENDENT_AMBULATORY_CARE_PROVIDER_SITE_OTHER): Payer: Medicare Other | Admitting: Family Medicine

## 2016-01-18 ENCOUNTER — Encounter: Payer: Self-pay | Admitting: Family Medicine

## 2016-01-18 VITALS — BP 118/69 | HR 79 | Temp 97.6°F | Ht 65.5 in | Wt 150.0 lb

## 2016-01-18 DIAGNOSIS — L02511 Cutaneous abscess of right hand: Secondary | ICD-10-CM

## 2016-01-18 DIAGNOSIS — R110 Nausea: Secondary | ICD-10-CM

## 2016-01-18 DIAGNOSIS — L02211 Cutaneous abscess of abdominal wall: Secondary | ICD-10-CM

## 2016-01-18 DIAGNOSIS — Z139 Encounter for screening, unspecified: Secondary | ICD-10-CM | POA: Diagnosis not present

## 2016-01-18 NOTE — Progress Notes (Signed)
Subjective:  Patient ID: Cassandra [name], female    DOB: May 03, 1956  Age: 61 y.o. MRN: [id number]  CC: No chief complaint on file.   HPI Cassandra [name] presents for Recheck of abscessed wounds. She was seen 3 days ago and had 2 abscesses of the lower wall packed. she is back today to have the packing removed and replaced under supervision if needed.   Additionally there is a smaller abscess on the right fifth finger. Feels much better since lanced here 5 days ago. Some pus was expressed yesterday.  Some nausea daily. Has 1 week supply left of abx. Would like Hep C screen  History Afaf has a past medical history of Nicotine addiction; Hyperlipidemia; Chronic left shoulder pain; Tubular adenoma of colon (01/31/2000); Maternal complication related to childbirth; Skin cancer; Anxiety; Depression; COPD (chronic obstructive pulmonary disease) ([place]); Bowel habit changes; Meniere's disease; Chronic pain; and Rapid or irregular heartbeat.   She has past surgical history that includes Bladder repair; Shoulder arthroscopy; Abdominal hysterectomy; Knee arthroscopy (Left); Cesarean section; ruptured uterus; and Anal Rectal manometry (N/A, 12/09/2015).   Her family history includes COPD in her brother; Colon polyps in her sister; Coronary artery disease in her brother, father, and mother; Diabetes in her mother; Heart disease in her sister; Kidney disease in her mother; Liver cancer in her maternal grandmother; Liver disease in her maternal grandmother; Lung cancer in her paternal grandmother; Lung disease in her father; Polymyalgia rheumatica in her sister; Prostate cancer in her father.She reports that she quit smoking about 7 months ago. Her smoking use included Cigarettes. She smoked 1.00 pack per day. She has never used smokeless tobacco. She reports that she does not drink alcohol or use illicit drugs.    ROS Review of Systems  Constitutional: Negative for fever, activity change and appetite change.    Gastrointestinal: Negative for nausea, abdominal pain and diarrhea.  Genitourinary: Negative for dysuria.  Musculoskeletal: Negative for myalgias and arthralgias.    Objective:  BP 118/69 mmHg  Pulse 79  Temp(Src) 97.6 F (36.4 C) (Oral)  Ht 5' 5.5" (1.664 m)  Wt 150 lb (68.04 kg)  BMI 24.57 kg/m2  BP Readings from Last 3 Encounters:  01/18/16 118/69  01/15/16 124/77  01/12/16 117/71    Wt Readings from Last 3 Encounters:  01/18/16 150 lb (68.04 kg)  01/15/16 150 lb (68.04 kg)  01/12/16 151 lb 3.2 oz (68.584 kg)     Physical Exam  Constitutional: She appears well-developed and well-nourished.  HENT:  Head: Normocephalic.  Cardiovascular: Normal rate and regular rhythm.   No murmur heard. Pulmonary/Chest: Effort normal and breath sounds normal.  Abdominal: Soft. Bowel sounds are normal. She exhibits no distension. Tenderness: Incisions now free of erythema. Wicks removed. No purulent matter noted on either abd wound. Simple dressing only applied using neosporin and gauze..    Musculoskeletal: She exhibits no tenderness.  Skin:  Right fifth finger lesion is improving,no erythema. No fluctuance or edema     The dressings were removed from the abscesses on the abd. There was no matter on the wicks. Followed by dressings.    Lab Results  Component Value Date   WBC 7.8 09/23/2015   HGB 14.7 12/10/2013   HCT 41.0 09/23/2015   PLT 326 09/23/2015   GLUCOSE 86 09/23/2015   CHOL 157 12/10/2013   TRIG 71 12/10/2013   HDL 64 12/10/2013   LDLCALC 79 12/10/2013   ALT 9 09/23/2015   AST 18 09/23/2015  NA 142 09/23/2015   K 5.0 09/23/2015   CL 99 09/23/2015   CREATININE 0.98 09/23/2015   BUN 11 09/23/2015   CO2 25 09/23/2015   TSH 0.808 12/10/2013    No results found.  Assessment & Plan:   Diagnoses and all orders for this visit:  Abscess of abdominal wall -     CBC with Differential/Platelet  Abscess of fifth finger, right -     CBC with  Differential/Platelet  Nausea without vomiting -     CBC with Differential/Platelet  Screening -     Hepatitis c antibody (reflex)      I am having Ms. Kong maintain her lidocaine, gabapentin, methocarbamol, naproxen, gabapentin, ALPRAZolam, zolpidem, conjugated estrogens, escitalopram, fexofenadine, meclizine, mesalamine, omeprazole, oxyCODONE-acetaminophen, LIALDA, Olopatadine HCl, hydroxypropyl methylcellulose / hypromellose, doxycycline, and HYDROcodone-acetaminophen.  No orders of the defined types were placed in this encounter.     Follow-up: Return in about 8 years (around 01/18/2024).  [name], M.D.

## 2016-01-19 LAB — CBC WITH DIFFERENTIAL/PLATELET
Basophils Absolute: 0 10*3/uL (ref 0.0–0.2)
Basos: 1 %
EOS (ABSOLUTE): 0.1 10*3/uL (ref 0.0–0.4)
Eos: 2 %
Hematocrit: 37.8 % (ref 34.0–46.6)
Hemoglobin: 12.4 g/dL (ref 11.1–15.9)
IMMATURE GRANULOCYTES: 0 %
Immature Grans (Abs): 0 10*3/uL (ref 0.0–0.1)
Lymphocytes Absolute: 2.4 10*3/uL (ref 0.7–3.1)
Lymphs: 40 %
MCH: 29.2 pg (ref 26.6–33.0)
MCHC: 32.8 g/dL (ref 31.5–35.7)
MCV: 89 fL (ref 79–97)
MONOS ABS: 0.5 10*3/uL (ref 0.1–0.9)
Monocytes: 8 %
NEUTROS PCT: 49 %
Neutrophils Absolute: 3 10*3/uL (ref 1.4–7.0)
PLATELETS: 391 10*3/uL — AB (ref 150–379)
RBC: 4.24 x10E6/uL (ref 3.77–5.28)
RDW: 14.5 % (ref 12.3–15.4)
WBC: 6 10*3/uL (ref 3.4–10.8)

## 2016-01-19 LAB — HCV COMMENT:

## 2016-01-19 LAB — HEPATITIS C ANTIBODY (REFLEX): HCV Ab: 0.1 s/co ratio (ref 0.0–0.9)

## 2016-01-31 ENCOUNTER — Other Ambulatory Visit: Payer: Self-pay | Admitting: Physical Medicine & Rehabilitation

## 2016-02-01 ENCOUNTER — Other Ambulatory Visit: Payer: Self-pay | Admitting: Physical Medicine & Rehabilitation

## 2016-02-02 ENCOUNTER — Other Ambulatory Visit: Payer: Self-pay | Admitting: Physical Medicine & Rehabilitation

## 2016-02-02 ENCOUNTER — Ambulatory Visit: Payer: Medicare Other | Admitting: Family Medicine

## 2016-02-23 ENCOUNTER — Encounter: Payer: Self-pay | Admitting: Physical Medicine & Rehabilitation

## 2016-02-23 ENCOUNTER — Encounter: Payer: Medicare Other | Attending: Physical Medicine & Rehabilitation | Admitting: Physical Medicine & Rehabilitation

## 2016-02-23 VITALS — BP 162/83 | HR 68 | Resp 16

## 2016-02-23 DIAGNOSIS — M542 Cervicalgia: Secondary | ICD-10-CM | POA: Diagnosis not present

## 2016-02-23 DIAGNOSIS — M791 Myalgia: Secondary | ICD-10-CM | POA: Insufficient documentation

## 2016-02-23 DIAGNOSIS — G8929 Other chronic pain: Secondary | ICD-10-CM | POA: Insufficient documentation

## 2016-02-23 DIAGNOSIS — F411 Generalized anxiety disorder: Secondary | ICD-10-CM | POA: Diagnosis present

## 2016-02-23 DIAGNOSIS — G894 Chronic pain syndrome: Secondary | ICD-10-CM | POA: Diagnosis not present

## 2016-02-23 DIAGNOSIS — K589 Irritable bowel syndrome without diarrhea: Secondary | ICD-10-CM

## 2016-02-23 DIAGNOSIS — M7631 Iliotibial band syndrome, right leg: Secondary | ICD-10-CM | POA: Insufficient documentation

## 2016-02-23 DIAGNOSIS — M7502 Adhesive capsulitis of left shoulder: Secondary | ICD-10-CM | POA: Diagnosis not present

## 2016-02-23 DIAGNOSIS — Z5181 Encounter for therapeutic drug level monitoring: Secondary | ICD-10-CM | POA: Diagnosis not present

## 2016-02-23 DIAGNOSIS — Z79899 Other long term (current) drug therapy: Secondary | ICD-10-CM

## 2016-02-23 DIAGNOSIS — M25512 Pain in left shoulder: Secondary | ICD-10-CM | POA: Diagnosis not present

## 2016-02-23 DIAGNOSIS — M7501 Adhesive capsulitis of right shoulder: Secondary | ICD-10-CM | POA: Diagnosis not present

## 2016-02-23 DIAGNOSIS — M7918 Myalgia, other site: Secondary | ICD-10-CM

## 2016-02-23 MED ORDER — OXYCODONE-ACETAMINOPHEN 5-325 MG PO TABS: 1.0000 | ORAL_TABLET | Freq: Three times a day (TID) | ORAL | Status: DC | PRN

## 2016-02-23 MED ORDER — DICYCLOMINE HCL 10 MG PO CAPS: 10.0000 mg | ORAL_CAPSULE | Freq: Three times a day (TID) | ORAL | Status: DC

## 2016-02-23 NOTE — Patient Instructions (Signed)
  PLEASE CALL ME WITH ANY PROBLEMS OR QUESTIONS (#[phone]).      

## 2016-02-23 NOTE — Progress Notes (Signed)
Subjective:    Patient ID: Cassandra [name], female    DOB: 1956/03/17, 60 y.o.   MRN: [id number]  HPI   Cassandra [name] is here in follow up of her chronic pain. She has had ongoing IBS issues. She was started on a new lialda for her bowels but she wasn't able to afford. Apparently her work up was negative for lesions/UC however. ( i don't see a procedure report thought).   The TENS unit was very helpful for her right neck and arm. She uses it daily. Additionally she stays on her percocet which is effective.  She has been to the doctor and ED with MRSA infections which have required abx. She has three new areas on her left arm which are concerning.   Pain Inventory Average Pain 8 Pain Right Now 8 My pain is stabbing  In the last 24 hours, has pain interfered with the following? General activity 8 Relation with others 8 Enjoyment of life NA What TIME of day is your pain at its worst? morning, evening, night Sleep (in general) Poor  Pain is worse with: bending, sitting, inactivity and some activites Pain improves with: heat/ice, pacing activities, medication and TENS Relief from Meds: 6  Mobility walk without assistance ability to climb steps?  yes do you drive?  yes needs help with transfers  Function not employed: date last employed 2005 I need assistance with the following:  dressing, meal prep and shopping  Neuro/Psych bladder control problems bowel control problems weakness numbness tremor tingling spasms dizziness confusion depression anxiety  Prior Studies Any changes since last visit?  no bone scan x-rays CT/MRI nerve study  Physicians involved in your care Any changes since last visit?  no   Family History  Problem Relation Age of Onset  . Coronary artery disease Mother   . Diabetes Mother     borderline  . Kidney disease Mother     stage 4  . Coronary artery disease Father   . Prostate cancer Father   . Lung disease Father     black lung  .  Liver cancer Maternal Grandmother   . Liver disease Maternal Grandmother   . Colon cancer      Both sides  . Polymyalgia rheumatica Sister   . Heart disease Sister   . Coronary artery disease Brother     heart attack  . COPD Brother   . Colon polyps Sister   . Lung cancer Paternal Grandmother    Social History   Social History  . Marital Status: Married    Spouse Name: N/A  . Number of Children: 1  . Years of Education: N/A   Occupational History  . Homemaker    Social History Main Topics  . Smoking status: Former Smoker -- 1.00 packs/day    Types: Cigarettes    Quit date: 06/01/2015  . Smokeless tobacco: Never Used     Comment: would like to try chantix, but has not started on it due to warnings related to depression  . Alcohol Use: No  . Drug Use: No  . Sexual Activity: Yes   Other Topics Concern  . None   Social History Narrative   Past Surgical History  Procedure Laterality Date  . Bladder repair      sling/with perforation  . Shoulder arthroscopy      multiple  . Abdominal hysterectomy      age 67  . Knee arthroscopy Left   . Cesarean section  1 time  . Ruptured uterus    . Anal rectal manometry N/A 12/09/2015    Procedure: ANO RECTAL MANOMETRY;  Surgeon: Mauri Pole, MD;  Location: [facility];  Service: Endoscopy;  Laterality: N/A;   Past Medical History  Diagnosis Date  . Nicotine addiction     2 packs per day   . Hyperlipidemia   . Chronic left shoulder pain     frozen   . Tubular adenoma of colon 01/31/2000    Dr. Richmond Campbell  . Maternal complication related to childbirth     Sepsis, staph  . Skin cancer     Rt. leg, squamous cell  . Anxiety   . Depression   . COPD (chronic obstructive pulmonary disease) ([place])     Dr. Redge Gainer  . Bowel habit changes     excessive gas /frequent BM's with change in shape with pus  . Meniere's disease   . Chronic pain     left upper shoulder ,neck and back  . Rapid or irregular  heartbeat    BP 162/83 mmHg  Pulse 68  Resp 16  SpO2 96%  Opioid Risk Score:   Fall Risk Score:  `1  Depression screen PHQ 2/9  Depression screen [facility] 2/9 12/28/2015 12/07/2015 06/04/2015 03/09/2015 01/13/2015 12/15/2014 10/01/2014  Decreased Interest 2 0 2 2 1 2 2   Down, Depressed, Hopeless 1 0 1 1 1 2 2   PHQ - 2 Score 3 0 3 3 2 4 4   Altered sleeping - - - 3 1 3 3   Tired, decreased energy - - - 3 2 3 3   Change in appetite - - - 3 1 3 3   Feeling bad or failure about yourself  - - - 0 1 2 1   Trouble concentrating - - - 2 1 3 2   Moving slowly or fidgety/restless - - - 2 1 3 2   Suicidal thoughts - - - 0 0 0 1  PHQ-9 Score - - - 16 9 21 19   Difficult doing work/chores - - - - Somewhat difficult - -     Review of Systems  Constitutional: Positive for appetite change and unexpected weight change.       Bladder control problems Bowel control problems   Gastrointestinal: Positive for abdominal pain, diarrhea and constipation.  Genitourinary: Positive for difficulty urinating.  Neurological: Positive for dizziness, tremors, weakness and numbness.       Tingling Spasms   Psychiatric/Behavioral: Positive for confusion and dysphoric mood. The patient is nervous/anxious.   All other systems reviewed and are negative.      Objective:   Physical Exam  General: Alert and oriented x 3, No apparent distress. Appears to be in good physical condition  HEENT: Head is normocephalic, atraumatic, PERRLA, EOMI, sclera anicteric, oral mucosa pink and moist, dentition intact, ext ear canals clear,  Neck: Supple without JVD or lymphadenopathy  Heart: Reg rate and rhythm. No murmurs rubs or gallops  Chest: CTA bilaterally without wheezes, rales, or rhonchi; no distress  Abdomen: Soft, non-tender, non-distended, bowel sounds positive.  Extremities: No clubbing, cyanosis, or edema. Pulses are 2+  Skin: Clean and intact without signs of breakdown. Area of shoulder surgery noted.  Neuro: Pt is cognitively  appropriate with normal insight, memory, and awareness. Cranial nerves 2-12 are intact. Sensory exam is normal. Reflexes are 2+ in all 4's. Fine motor coordination is intact. No tremors. Motor function is grossly 5/5.  Musculoskeletal: left shoulder limited in ROM. Area is  NOT alodynic. She has spasm along the left trap and SCM. Shoulder girdle positioning and head posture is fair.Marland Kitchen Spurling's test negative again.  Psych: Pt's affect is appropriate. Pt is cooperative and pleasant as always    Assessment & Plan:   1. Post-traumatic arthritis left shoulder s/p 2 shoulder surgeries. Now with adhesive capsulitis left shoulder.  2. Cervicalgia, spondylosis.  3. Right ITB syndrome  4. Depression/insomnia/anxiety  5. Continued loose stool. ?IBS --no evidence of UC from what I can find   Plan:  1. Continue TENS as this has been effective---still can consider PENS trial  2. Robaxin for muscle spasms  3. Gabapentin continued  4. Naproxen 500mg  bid  5. ITB stretching and exercises  6. Refilled percocet for breakthrough pain.  7. Continue traction/posture exercises for neck as we described.  8. Lexapro for depression/anxiety at 20mg  daily  9. Probiotic for bowels. Also rxe'd bentyl for bowel spasms. Further work up per GI. She will check on co-pay assist for lialda.  10. I or NP will see her back in about 2 month. 30 minutes of face to face patient care time were spent during this visit. All questions were encouraged and answered.

## 2016-02-28 ENCOUNTER — Other Ambulatory Visit: Payer: Self-pay | Admitting: Physical Medicine & Rehabilitation

## 2016-03-03 LAB — TOXASSURE SELECT,+ANTIDEPR,UR: PDF: 0

## 2016-03-10 ENCOUNTER — Ambulatory Visit (INDEPENDENT_AMBULATORY_CARE_PROVIDER_SITE_OTHER): Payer: Medicare Other | Admitting: Family

## 2016-03-10 ENCOUNTER — Encounter: Payer: Self-pay | Admitting: Family

## 2016-03-10 VITALS — BP 138/88 | HR 75 | Temp 98.0°F | Ht 65.5 in | Wt 147.2 lb

## 2016-03-10 DIAGNOSIS — S70922A Unspecified superficial injury of left thigh, initial encounter: Secondary | ICD-10-CM | POA: Diagnosis not present

## 2016-03-10 DIAGNOSIS — W57XXXA Bitten or stung by nonvenomous insect and other nonvenomous arthropods, initial encounter: Secondary | ICD-10-CM

## 2016-03-10 DIAGNOSIS — L03116 Cellulitis of left lower limb: Secondary | ICD-10-CM | POA: Diagnosis not present

## 2016-03-10 MED ORDER — DOXYCYCLINE HYCLATE 100 MG PO TABS: 100.0000 mg | ORAL_TABLET | Freq: Two times a day (BID) | ORAL | Status: DC

## 2016-03-10 MED ORDER — CEFTRIAXONE SODIUM 1 G IJ SOLR
1.0000 g | Freq: Once | INTRAMUSCULAR | Status: AC
  Administered 2016-03-10: 1 g via INTRAMUSCULAR

## 2016-03-10 NOTE — Progress Notes (Signed)
   Subjective:    Patient ID: Cassandra [name], female    DOB: December 09, 1955, 60 y.o.   MRN: [id number]  HPI Pt presents to the office with an infection circular rash on outer left thigh. PT states she noticed three days and it was become worse. PT states she is having a intermittent pain of  7 out 10 pain and it is worse when she lays on it or touches it. PT denies any insect bite that she knows of. Pt denies any drainage, but states it is warm to touch and is red.    Review of Systems  Constitutional: Negative.   HENT: Negative.   Eyes: Negative.   Respiratory: Negative.  Negative for shortness of breath.   Cardiovascular: Negative.  Negative for palpitations.  Gastrointestinal: Negative.   Endocrine: Negative.   Genitourinary: Negative.   Musculoskeletal: Negative.   Skin: Positive for rash and wound.  Neurological: Negative.  Negative for headaches.  Hematological: Negative.   Psychiatric/Behavioral: Negative.   All other systems reviewed and are negative.      Objective:   Physical Exam  Constitutional: She is oriented to person, place, and time. She appears well-developed and well-nourished. No distress.  HENT:  Head: Normocephalic and atraumatic.  Cardiovascular: Normal rate, regular rhythm, normal heart sounds and intact distal pulses.   No murmur heard. Pulmonary/Chest: Effort normal and breath sounds normal. No respiratory distress. She has no wheezes.  Abdominal: Soft. Bowel sounds are normal. She exhibits no distension. There is no tenderness.  Musculoskeletal: Normal range of motion. She exhibits no edema or tenderness.  Neurological: She is alert and oriented to person, place, and time. She has normal reflexes. No cranial nerve deficit.  Skin: Skin is warm and dry. Rash noted. There is erythema.  Lateral left thigh Erythemas lesion apprx 1.2 cmX1.2cm with a erythemas boarder of 9cmX9cm, warmth and hard to touch   Psychiatric: She has a normal mood and affect. Her  behavior is normal. Judgment and thought content normal.  Vitals reviewed.   BP 138/88 mmHg  Pulse 75  Temp(Src) 98 F (36.7 C) (Oral)  Ht 5' 5.5" (1.664 m)  Wt 147 lb 3.2 oz (66.769 kg)  BMI 24.11 kg/m2       Assessment & Plan:  1. Insect bite - doxycycline (VIBRA-TABS) 100 MG tablet; Take 1 tablet (100 mg total) by mouth 2 (two) times daily.  Dispense: 20 tablet; Refill: 0 - cefTRIAXone (ROCEPHIN) injection 1 g; Inject 1 g into the muscle once.  2. Cellulitis of left lower extremity -Area marked- Discussed in length if redness grows to RTO -Rest -Warm compresses -Do not pick or squeeze RTO in 4 days - doxycycline (VIBRA-TABS) 100 MG tablet; Take 1 tablet (100 mg total) by mouth 2 (two) times daily.  Dispense: 20 tablet; Refill: 0 - cefTRIAXone (ROCEPHIN) injection 1 g; Inject 1 g into the muscle once.  Cassandra Dun, FNP

## 2016-03-10 NOTE — Patient Instructions (Signed)

## 2016-03-10 NOTE — Progress Notes (Signed)
Urine drug screen for this encounter is consistent for prescribed medication 

## 2016-03-11 ENCOUNTER — Ambulatory Visit: Payer: Medicare Other | Admitting: Family

## 2016-03-14 ENCOUNTER — Encounter: Payer: Self-pay | Admitting: Family

## 2016-03-14 ENCOUNTER — Ambulatory Visit (INDEPENDENT_AMBULATORY_CARE_PROVIDER_SITE_OTHER): Payer: Medicare Other | Admitting: Family

## 2016-03-14 VITALS — BP 131/81 | HR 79 | Temp 98.4°F | Ht 65.5 in | Wt 150.0 lb

## 2016-03-14 DIAGNOSIS — L0291 Cutaneous abscess, unspecified: Secondary | ICD-10-CM

## 2016-03-14 NOTE — Patient Instructions (Signed)

## 2016-03-14 NOTE — Progress Notes (Signed)
   Subjective:    Patient ID: Cassandra [name], female    DOB: [birth year], 60 y.o.   MRN: [id number]  HPI Pt presents to the office today to recheck cellulitis of her left thigh.Since starting the antibiotic it has started draining serosanguinous  fluid. Pt states the redness has improved and the pain. Pt states she continues to have pain of out of 10. PT states she is doing warm compresses several times a day.    Review of Systems  Constitutional: Negative.   HENT: Negative.   Eyes: Negative.   Respiratory: Negative.  Negative for shortness of breath.   Cardiovascular: Negative.  Negative for palpitations.  Gastrointestinal: Negative.   Endocrine: Negative.   Genitourinary: Negative.   Musculoskeletal: Negative.   Skin: Positive for rash and wound.  Neurological: Negative.  Negative for headaches.  Hematological: Negative.   Psychiatric/Behavioral: Negative.   All other systems reviewed and are negative.      Objective:   Physical Exam  Constitutional: She is oriented to person, place, and time. She appears well-developed and well-nourished. No distress.  HENT:  Head: Normocephalic and atraumatic.  Cardiovascular: Normal rate, regular rhythm, normal heart sounds and intact distal pulses.   No murmur heard. Pulmonary/Chest: Effort normal and breath sounds normal. No respiratory distress. She has no wheezes.  Abdominal: Soft. Bowel sounds are normal. She exhibits no distension. There is no tenderness.  Musculoskeletal: Normal range of motion. She exhibits no edema or tenderness.  Neurological: She is alert and oriented to person, place, and time. She has normal reflexes. No cranial nerve deficit.  Skin: Skin is warm and dry. Rash noted. There is erythema.  Lateral left thigh erythemas circular abscess. Warm to touch and hard. Drained serosanguinous fluid from wound    Psychiatric: She has a normal mood and affect. Her behavior is normal. Judgment and thought content normal.    Vitals reviewed.   BP 131/81 mmHg  Pulse 79  Temp(Src) 98.4 F (36.9 C) (Oral)  Ht 5' 5.5" (1.664 m)  Wt 150 lb (68.04 kg)  BMI 24.57 kg/m2       Assessment & Plan:  1. Abscess -Continue doxycycline -Warm compresses -Do not pick or squeeze Area marked, if redness or warmth becomes worse RTO RTO on 03/21/16  [name], FNP

## 2016-03-22 ENCOUNTER — Ambulatory Visit: Payer: Medicare Other | Admitting: Family

## 2016-03-24 ENCOUNTER — Ambulatory Visit (INDEPENDENT_AMBULATORY_CARE_PROVIDER_SITE_OTHER): Payer: Medicare Other | Admitting: Family

## 2016-03-24 ENCOUNTER — Encounter: Payer: Self-pay | Admitting: Family

## 2016-03-24 VITALS — BP 124/62 | HR 64 | Temp 97.9°F | Ht 65.5 in | Wt 148.2 lb

## 2016-03-24 DIAGNOSIS — M5412 Radiculopathy, cervical region: Secondary | ICD-10-CM | POA: Diagnosis not present

## 2016-03-24 DIAGNOSIS — F329 Major depressive disorder, single episode, unspecified: Secondary | ICD-10-CM

## 2016-03-24 DIAGNOSIS — M19012 Primary osteoarthritis, left shoulder: Secondary | ICD-10-CM

## 2016-03-24 DIAGNOSIS — Z114 Encounter for screening for human immunodeficiency virus [HIV]: Secondary | ICD-10-CM | POA: Diagnosis not present

## 2016-03-24 DIAGNOSIS — F411 Generalized anxiety disorder: Secondary | ICD-10-CM | POA: Diagnosis not present

## 2016-03-24 DIAGNOSIS — Z01419 Encounter for gynecological examination (general) (routine) without abnormal findings: Secondary | ICD-10-CM | POA: Diagnosis not present

## 2016-03-24 DIAGNOSIS — T148 Other injury of unspecified body region: Secondary | ICD-10-CM | POA: Diagnosis not present

## 2016-03-24 DIAGNOSIS — W57XXXA Bitten or stung by nonvenomous insect and other nonvenomous arthropods, initial encounter: Secondary | ICD-10-CM

## 2016-03-24 DIAGNOSIS — K589 Irritable bowel syndrome without diarrhea: Secondary | ICD-10-CM

## 2016-03-24 DIAGNOSIS — Z0000 Encounter for general adult medical examination without abnormal findings: Secondary | ICD-10-CM | POA: Diagnosis not present

## 2016-03-24 DIAGNOSIS — G4700 Insomnia, unspecified: Secondary | ICD-10-CM

## 2016-03-24 DIAGNOSIS — E785 Hyperlipidemia, unspecified: Secondary | ICD-10-CM | POA: Diagnosis not present

## 2016-03-24 DIAGNOSIS — L0291 Cutaneous abscess, unspecified: Secondary | ICD-10-CM | POA: Diagnosis not present

## 2016-03-24 DIAGNOSIS — F32A Depression, unspecified: Secondary | ICD-10-CM | POA: Insufficient documentation

## 2016-03-24 MED ORDER — SULFAMETHOXAZOLE-TRIMETHOPRIM 800-160 MG PO TABS: 1.0000 | ORAL_TABLET | Freq: Two times a day (BID) | ORAL | Status: DC

## 2016-03-24 NOTE — Patient Instructions (Signed)
Health Maintenance, Female Adopting a healthy lifestyle and getting preventive care can go a long way to promote health and wellness. Talk with your health care provider about what schedule of regular examinations is right for you. This is a good chance for you to check in with your provider about disease prevention and staying healthy. In between checkups, there are plenty of things you can do on your own. Experts have done a lot of research about which lifestyle changes and preventive measures are most likely to keep you healthy. Ask your health care provider for more information. WEIGHT AND DIET  Eat a healthy diet  Be sure to include plenty of vegetables, fruits, low-fat dairy products, and lean protein.  Do not eat a lot of foods high in solid fats, added sugars, or salt.  Get regular exercise. This is one of the most important things you can do for your health.  Most adults should exercise for at least 150 minutes each week. The exercise should increase your heart rate and make you sweat (moderate-intensity exercise).  Most adults should also do strengthening exercises at least twice a week. This is in addition to the moderate-intensity exercise.  Maintain a healthy weight  Body mass index (BMI) is a measurement that can be used to identify possible weight problems. It estimates body fat based on height and weight. Your health care provider can help determine your BMI and help you achieve or maintain a healthy weight.  For females 20 years of age and older:   A BMI below 18.5 is considered underweight.  A BMI of 18.5 to 24.9 is normal.  A BMI of 25 to 29.9 is considered overweight.  A BMI of 30 and above is considered obese.  Watch levels of cholesterol and blood lipids  You should start having your blood tested for lipids and cholesterol at 60 years of age, then have this test every 5 years.  You may need to have your cholesterol levels checked more often if:  Your lipid  or cholesterol levels are high.  You are older than 60 years of age.  You are at high risk for heart disease.  CANCER SCREENING   Lung Cancer  Lung cancer screening is recommended for adults 55-80 years old who are at high risk for lung cancer because of a history of smoking.  A yearly low-dose CT scan of the lungs is recommended for people who:  Currently smoke.  Have quit within the past 15 years.  Have at least a 30-pack-year history of smoking. A pack year is smoking an average of one pack of cigarettes a day for 1 year.  Yearly screening should continue until it has been 15 years since you quit.  Yearly screening should stop if you develop a health problem that would prevent you from having lung cancer treatment.  Breast Cancer  Practice breast self-awareness. This means understanding how your breasts normally appear and feel.  It also means doing regular breast self-exams. Let your health care provider know about any changes, no matter how small.  If you are in your 20s or 30s, you should have a clinical breast exam (CBE) by a health care provider every 1-3 years as part of a regular health exam.  If you are 40 or older, have a CBE every year. Also consider having a breast X-ray (mammogram) every year.  If you have a family history of breast cancer, talk to your health care provider about genetic screening.  If you   are at high risk for breast cancer, talk to your health care provider about having an MRI and a mammogram every year.  Breast cancer gene (BRCA) assessment is recommended for women who have family members with BRCA-related cancers. BRCA-related cancers include:  Breast.  Ovarian.  Tubal.  Peritoneal cancers.  Results of the assessment will determine the need for genetic counseling and BRCA1 and BRCA2 testing. Cervical Cancer Your health care provider may recommend that you be screened regularly for cancer of the pelvic organs (ovaries, uterus, and  vagina). This screening involves a pelvic examination, including checking for microscopic changes to the surface of your cervix (Pap test). You may be encouraged to have this screening done every 3 years, beginning at age 21.  For women ages 30-65, health care providers may recommend pelvic exams and Pap testing every 3 years, or they may recommend the Pap and pelvic exam, combined with testing for human papilloma virus (HPV), every 5 years. Some types of HPV increase your risk of cervical cancer. Testing for HPV may also be done on women of any age with unclear Pap test results.  Other health care providers may not recommend any screening for nonpregnant women who are considered low risk for pelvic cancer and who do not have symptoms. Ask your health care provider if a screening pelvic exam is right for you.  If you have had past treatment for cervical cancer or a condition that could lead to cancer, you need Pap tests and screening for cancer for at least 20 years after your treatment. If Pap tests have been discontinued, your risk factors (such as having a new sexual partner) need to be reassessed to determine if screening should resume. Some women have medical problems that increase the chance of getting cervical cancer. In these cases, your health care provider may recommend more frequent screening and Pap tests. Colorectal Cancer  This type of cancer can be detected and often prevented.  Routine colorectal cancer screening usually begins at 60 years of age and continues through 60 years of age.  Your health care provider may recommend screening at an earlier age if you have risk factors for colon cancer.  Your health care provider may also recommend using home test kits to check for hidden blood in the stool.  A small camera at the end of a tube can be used to examine your colon directly (sigmoidoscopy or colonoscopy). This is done to check for the earliest forms of colorectal  cancer.  Routine screening usually begins at age 50.  Direct examination of the colon should be repeated every 5-10 years through 60 years of age. However, you may need to be screened more often if early forms of precancerous polyps or small growths are found. Skin Cancer  Check your skin from head to toe regularly.  Tell your health care provider about any new moles or changes in moles, especially if there is a change in a mole's shape or color.  Also tell your health care provider if you have a mole that is larger than the size of a pencil eraser.  Always use sunscreen. Apply sunscreen liberally and repeatedly throughout the day.  Protect yourself by wearing long sleeves, pants, a wide-brimmed hat, and sunglasses whenever you are outside. HEART DISEASE, DIABETES, AND HIGH BLOOD PRESSURE   High blood pressure causes heart disease and increases the risk of stroke. High blood pressure is more likely to develop in:  People who have blood pressure in the high end   of the normal range (130-139/85-89 mm Hg).  People who are overweight or obese.  People who are African American.  If you are 38-23 years of age, have your blood pressure checked every 3-5 years. If you are 61 years of age or older, have your blood pressure checked every year. You should have your blood pressure measured twice--once when you are at a hospital or clinic, and once when you are not at a hospital or clinic. Record the average of the two measurements. To check your blood pressure when you are not at a hospital or clinic, you can use:  An automated blood pressure machine at a pharmacy.  A home blood pressure monitor.  If you are between 45 years and 39 years old, ask your health care provider if you should take aspirin to prevent strokes.  Have regular diabetes screenings. This involves taking a blood sample to check your fasting blood sugar level.  If you are at a normal weight and have a low risk for diabetes,  have this test once every three years after 60 years of age.  If you are overweight and have a high risk for diabetes, consider being tested at a younger age or more often. PREVENTING INFECTION  Hepatitis B  If you have a higher risk for hepatitis B, you should be screened for this virus. You are considered at high risk for hepatitis B if:  You were born in a country where hepatitis B is common. Ask your health care provider which countries are considered high risk.  Your parents were born in a high-risk country, and you have not been immunized against hepatitis B (hepatitis B vaccine).  You have HIV or AIDS.  You use needles to inject street drugs.  You live with someone who has hepatitis B.  You have had sex with someone who has hepatitis B.  You get hemodialysis treatment.  You take certain medicines for conditions, including cancer, organ transplantation, and autoimmune conditions. Hepatitis C  Blood testing is recommended for:  Everyone born from [birth year] through 1965.  Anyone with known risk factors for hepatitis C. Sexually transmitted infections (STIs)  You should be screened for sexually transmitted infections (STIs) including gonorrhea and chlamydia if:  You are sexually active and are younger than 60 years of age.  You are older than 60 years of age and your health care provider tells you that you are at risk for this type of infection.  Your sexual activity has changed since you were last screened and you are at an increased risk for chlamydia or gonorrhea. Ask your health care provider if you are at risk.  If you do not have HIV, but are at risk, it may be recommended that you take a prescription medicine daily to prevent HIV infection. This is called pre-exposure prophylaxis (PrEP). You are considered at risk if:  You are sexually active and do not regularly use condoms or know the HIV status of your partner(s).  You take drugs by injection.  You are sexually  active with a partner who has HIV. Talk with your health care provider about whether you are at high risk of being infected with HIV. If you choose to begin PrEP, you should first be tested for HIV. You should then be tested every 3 months for as long as you are taking PrEP.  PREGNANCY   If you are premenopausal and you may become pregnant, ask your health care provider about preconception counseling.  If you may  become pregnant, take 400 to 800 micrograms (mcg) of folic acid every day.  If you want to prevent pregnancy, talk to your health care provider about birth control (contraception). OSTEOPOROSIS AND MENOPAUSE   Osteoporosis is a disease in which the bones lose minerals and strength with aging. This can result in serious bone fractures. Your risk for osteoporosis can be identified using a bone density scan.  If you are 73 years of age or older, or if you are at risk for osteoporosis and fractures, ask your health care provider if you should be screened.  Ask your health care provider whether you should take a calcium or vitamin D supplement to lower your risk for osteoporosis.  Menopause may have certain physical symptoms and risks.  Hormone replacement therapy may reduce some of these symptoms and risks. Talk to your health care provider about whether hormone replacement therapy is right for you.  HOME CARE INSTRUCTIONS   Schedule regular health, dental, and eye exams.  Stay current with your immunizations.   Do not use any tobacco products including cigarettes, chewing tobacco, or electronic cigarettes.  If you are pregnant, do not drink alcohol.  If you are breastfeeding, limit how much and how often you drink alcohol.  Limit alcohol intake to no more than 1 drink per day for nonpregnant women. One drink equals 12 ounces of beer, 5 ounces of wine, or 1 ounces of hard liquor.  Do not use street drugs.  Do not share needles.  Ask your health care provider for help if  you need support or information about quitting drugs.  Tell your health care provider if you often feel depressed.  Tell your health care provider if you have ever been abused or do not feel safe at home.   This information is not intended to replace advice given to you by your health care provider. Make sure you discuss any questions you have with your health care provider.   Document Released: 03/14/2011 Document Revised: 09/19/2014 Document Reviewed: 07/31/2013 Elsevier Interactive Patient Education [employer].

## 2016-03-24 NOTE — Progress Notes (Signed)
Subjective:    Patient ID: Cassandra [name], female    DOB: Mar 09, 1956, 60 y.o.   MRN: [id number]  Pt presents to the office today for CPE with pap. PT had an abscess/cellulitis   on left lateral thigh. PT was treated with Rocephin and Doxycycline 10 days. PT reports the redness as improved, but still has a "hard knot" and sometimes drain a clear fluid from it.  Gynecologic Exam The patient's pertinent negatives include no vaginal discharge. Pertinent negatives include no headaches.  Hyperlipidemia This is a chronic problem. The current episode started more than 1 year ago. The problem is uncontrolled. Recent lipid tests were reviewed and are high. She has no history of obesity. Pertinent negatives include no chest pain, myalgias or shortness of breath. Current antihyperlipidemic treatment includes diet change. The current treatment provides mild improvement of lipids. Risk factors for coronary artery disease include post-menopausal, a sedentary lifestyle and family history.  Depression      The patient presents with depression.  This is a chronic problem.  The current episode started more than 1 year ago.   The onset quality is gradual.   The problem occurs intermittently.  The problem has been waxing and waning since onset.  Associated symptoms include insomnia.  Associated symptoms include no helplessness, no hopelessness, not irritable, no restlessness, no myalgias, no headaches, not sad and no suicidal ideas.  Past treatments include SSRIs - Selective serotonin reuptake inhibitors.  Compliance with treatment is good.  Past medical history includes anxiety and depression.   Anxiety Presents for follow-up visit. Onset was 1 to 5 years ago. The problem has been waxing and waning. Symptoms include depressed mood, excessive worry, insomnia and nervous/anxious behavior. Patient reports no chest pain, palpitations, panic, restlessness, shortness of breath or suicidal ideas. Symptoms occur occasionally. The  symptoms are aggravated by family issues. The quality of sleep is fair.   Her past medical history is significant for anxiety/panic attacks and depression. Past treatments include SSRIs and benzodiazephines. The treatment provided mild relief. Compliance with prior treatments has been good.  Insomnia Primary symptoms: sleep disturbance, difficulty falling asleep.  The current episode started more than one year. The onset quality is gradual. The problem has been gradually improving since onset. Past treatments include medication. The treatment provided mild relief. PMH includes: depression.  Shoulder Pain  The pain is present in the left shoulder. This is a chronic problem. The current episode started more than 1 year ago. The problem has been waxing and waning. The quality of the pain is described as aching. The pain is at a severity of 7/10. Pertinent negatives include no inability to bear weight, limited range of motion or numbness. The symptoms are aggravated by activity. The treatment provided mild relief.  Gastroesophageal Reflux She reports no chest pain, no choking or no heartburn. This is a chronic problem. The current episode started more than 1 year ago. The problem occurs rarely. The problem has been resolved. The symptoms are aggravated by lying down. She has tried a PPI for the symptoms. The treatment provided significant relief.  IBS PT currently taking Bentyl   As needed for diarrhea. PT states this works well at this time.    Review of Systems  Constitutional: Negative.   HENT: Negative.   Eyes: Negative.   Respiratory: Negative.  Negative for choking and shortness of breath.   Cardiovascular: Negative.  Negative for chest pain and palpitations.  Gastrointestinal: Negative.  Negative for heartburn.  Endocrine: Negative.  Genitourinary: Negative.  Negative for vaginal bleeding, vaginal discharge and vaginal pain.  Musculoskeletal: Negative.  Negative for myalgias.    Neurological: Negative.  Negative for numbness and headaches.  Hematological: Negative.   Psychiatric/Behavioral: Positive for depression and sleep disturbance. Negative for suicidal ideas. The patient is nervous/anxious and has insomnia.   All other systems reviewed and are negative.      Objective:   Physical Exam  Constitutional: She is oriented to person, place, and time. She appears well-developed and well-nourished. She is not irritable. No distress.  HENT:  Head: Normocephalic and atraumatic.  Right Ear: External ear normal.  Left Ear: External ear normal.  Nose: Nose normal.  Mouth/Throat: Oropharynx is clear and moist.  Eyes: Pupils are equal, round, and reactive to light.  Neck: Normal range of motion. Neck supple. No thyromegaly present.  Cardiovascular: Normal rate, regular rhythm, normal heart sounds and intact distal pulses.   No murmur heard. Pulmonary/Chest: Effort normal and breath sounds normal. No respiratory distress. She has no wheezes. Right breast exhibits no inverted nipple, no mass, no nipple discharge, no skin change and no tenderness. Left breast exhibits no inverted nipple, no mass, no nipple discharge, no skin change and no tenderness. Breasts are symmetrical.  Abdominal: Soft. Bowel sounds are normal. She exhibits no distension. There is no tenderness.  Genitourinary: Vagina normal.  Bimanual exam- no adnexal masses or tenderness, ovaries nonpalpable   Cervix parous and pink- No discharge    Musculoskeletal: Normal range of motion. She exhibits no edema or tenderness.  Neurological: She is alert and oriented to person, place, and time. She has normal reflexes. No cranial nerve deficit.  Skin: Skin is warm and dry.  Hard nodule on left lateral thigh, no redness or warmth present    Psychiatric: She has a normal mood and affect. Her behavior is normal. Judgment and thought content normal.  Vitals reviewed.   BP 124/62 mmHg  Pulse 64  Temp(Src) 97.9  F (36.6 C) (Oral)  Ht 5' 5.5" (1.664 m)  Wt 148 lb 3.2 oz (67.223 kg)  BMI 24.28 kg/m2       Assessment & Plan:  1. Hyperlipidemia - CMP14+EGFR  2. IBS (irritable bowel syndrome) - CMP14+EGFR  3. C7 radiculopathy - CMP14+EGFR  4. Primary osteoarthritis of left shoulder - CMP14+EGFR  5. Insomnia - CMP14+EGFR  6. Depression - CMP14+EGFR  7. GAD (generalized anxiety disorder) - CMP14+EGFR  8. Encounter for routine gynecological examination - CMP14+EGFR - Pap IG (Image Guided)  9. Laboratory tests ordered as part of a complete physical exam (CPE) - Anemia Profile B - CMP14+EGFR - HIV antibody - Lipid panel - Thyroid Panel With TSH - VITAMIN D 25 Hydroxy (Vit-D Deficiency, Fractures)  10. Screening for HIV (human immunodeficiency virus) - CMP14+EGFR - HIV antibody  11. Insect bite  12. Abscess -Pt given rx Bactrim  -Warm compresses -Do not squeeze - sulfamethoxazole-trimethoprim (BACTRIM DS) 800-160 MG tablet; Take 1 tablet by mouth 2 (two) times daily.  Dispense: 14 tablet; Refill: 0   Continue all meds Labs pending Health Maintenance reviewed Diet and exercise encouraged RTO 6 months  [name], FNP

## 2016-03-25 LAB — CMP14+EGFR
A/G RATIO: 1.8 (ref 1.2–2.2)
ALBUMIN: 4.6 g/dL (ref 3.6–4.8)
ALT: 10 IU/L (ref 0–32)
AST: 19 IU/L (ref 0–40)
Alkaline Phosphatase: 112 IU/L (ref 39–117)
BUN / CREAT RATIO: 19 (ref 12–28)
BUN: 21 mg/dL (ref 8–27)
CHLORIDE: 98 mmol/L (ref 96–106)
CO2: 25 mmol/L (ref 18–29)
Calcium: 9.4 mg/dL (ref 8.7–10.3)
Creatinine, Ser: 1.1 mg/dL — ABNORMAL HIGH (ref 0.57–1.00)
GFR calc non Af Amer: 55 mL/min/{1.73_m2} — ABNORMAL LOW (ref >59)
GFR, EST AFRICAN AMERICAN: 63 mL/min/{1.73_m2} (ref >59)
GLOBULIN, TOTAL: 2.6 g/dL (ref 1.5–4.5)
Glucose: 98 mg/dL (ref 65–99)
POTASSIUM: 4.4 mmol/L (ref 3.5–5.2)
SODIUM: 141 mmol/L (ref 134–144)
TOTAL PROTEIN: 7.2 g/dL (ref 6.0–8.5)

## 2016-03-25 LAB — VITAMIN D 25 HYDROXY (VIT D DEFICIENCY, FRACTURES): Vit D, 25-Hydroxy: 35.9 ng/mL (ref 30.0–100.0)

## 2016-03-25 LAB — ANEMIA PROFILE B
BASOS ABS: 0 10*3/uL (ref 0.0–0.2)
Basos: 0 %
EOS (ABSOLUTE): 0.2 10*3/uL (ref 0.0–0.4)
Eos: 3 %
FERRITIN: 144 ng/mL (ref 15–150)
Folate: 11.1 ng/mL (ref >3.0)
Hematocrit: 42 % (ref 34.0–46.6)
Hemoglobin: 14.1 g/dL (ref 11.1–15.9)
IRON SATURATION: 28 % (ref 15–55)
IRON: 71 ug/dL (ref 27–159)
Immature Grans (Abs): 0 10*3/uL (ref 0.0–0.1)
Immature Granulocytes: 0 %
LYMPHS ABS: 3.4 10*3/uL — AB (ref 0.7–3.1)
Lymphs: 50 %
MCH: 30.1 pg (ref 26.6–33.0)
MCHC: 33.6 g/dL (ref 31.5–35.7)
MCV: 90 fL (ref 79–97)
Monocytes Absolute: 0.6 10*3/uL (ref 0.1–0.9)
Monocytes: 8 %
NEUTROS ABS: 2.7 10*3/uL (ref 1.4–7.0)
Neutrophils: 39 %
PLATELETS: 312 10*3/uL (ref 150–379)
RBC: 4.68 x10E6/uL (ref 3.77–5.28)
RDW: 15.2 % (ref 12.3–15.4)
Retic Ct Pct: 1.2 % (ref 0.6–2.6)
Total Iron Binding Capacity: 255 ug/dL (ref 250–450)
UIBC: 184 ug/dL (ref 131–425)
VITAMIN B 12: 277 pg/mL (ref 211–946)
WBC: 6.9 10*3/uL (ref 3.4–10.8)

## 2016-03-25 LAB — PAP IG (IMAGE GUIDED): PAP SMEAR COMMENT: 0

## 2016-03-25 LAB — THYROID PANEL WITH TSH
FREE THYROXINE INDEX: 1.6 (ref 1.2–4.9)
T3 UPTAKE RATIO: 24 % (ref 24–39)
T4 TOTAL: 6.5 ug/dL (ref 4.5–12.0)
TSH: 0.602 u[IU]/mL (ref 0.450–4.500)

## 2016-03-25 LAB — LIPID PANEL
CHOLESTEROL TOTAL: 254 mg/dL — AB (ref 100–199)
Chol/HDL Ratio: 4 ratio units (ref 0.0–4.4)
HDL: 64 mg/dL (ref >39)
LDL Calculated: 173 mg/dL — ABNORMAL HIGH (ref 0–99)
Triglycerides: 86 mg/dL (ref 0–149)
VLDL Cholesterol Cal: 17 mg/dL (ref 5–40)

## 2016-03-25 LAB — HIV ANTIBODY (ROUTINE TESTING W REFLEX): HIV SCREEN 4TH GENERATION: NONREACTIVE

## 2016-03-28 ENCOUNTER — Other Ambulatory Visit: Payer: Self-pay | Admitting: Family

## 2016-03-28 ENCOUNTER — Telehealth: Payer: Self-pay | Admitting: Family Medicine

## 2016-03-28 NOTE — Telephone Encounter (Signed)
Pt to make appt and we can remove it

## 2016-03-28 NOTE — Telephone Encounter (Signed)
Patient aware of lab results. Patient also states that she is very concerned with her cellulitis in her left thigh that she was last seen for 7/3. She states that it has got a little better but that was after 3 antibiotic's and a shot.  Patient would like to know if there was any way you could remove it or send her to a surgeon? Patient is very concerned with it and would "like it gone". Please advise

## 2016-03-28 NOTE — Telephone Encounter (Signed)
Patient aware.

## 2016-04-06 ENCOUNTER — Other Ambulatory Visit: Payer: Self-pay | Admitting: Physical Medicine & Rehabilitation

## 2016-04-14 ENCOUNTER — Other Ambulatory Visit: Payer: Self-pay | Admitting: Registered Nurse

## 2016-04-25 ENCOUNTER — Encounter: Payer: Self-pay | Admitting: Registered Nurse

## 2016-04-25 ENCOUNTER — Encounter: Payer: Medicare Other | Attending: Physical Medicine & Rehabilitation | Admitting: Registered Nurse

## 2016-04-25 VITALS — BP 119/78 | HR 71

## 2016-04-25 DIAGNOSIS — M7631 Iliotibial band syndrome, right leg: Secondary | ICD-10-CM | POA: Diagnosis not present

## 2016-04-25 DIAGNOSIS — F411 Generalized anxiety disorder: Secondary | ICD-10-CM

## 2016-04-25 DIAGNOSIS — G8929 Other chronic pain: Secondary | ICD-10-CM | POA: Diagnosis not present

## 2016-04-25 DIAGNOSIS — G894 Chronic pain syndrome: Secondary | ICD-10-CM | POA: Diagnosis not present

## 2016-04-25 DIAGNOSIS — M7501 Adhesive capsulitis of right shoulder: Secondary | ICD-10-CM | POA: Diagnosis not present

## 2016-04-25 DIAGNOSIS — Z79899 Other long term (current) drug therapy: Secondary | ICD-10-CM | POA: Diagnosis not present

## 2016-04-25 DIAGNOSIS — M542 Cervicalgia: Secondary | ICD-10-CM | POA: Diagnosis not present

## 2016-04-25 DIAGNOSIS — M791 Myalgia: Secondary | ICD-10-CM | POA: Diagnosis not present

## 2016-04-25 DIAGNOSIS — M7502 Adhesive capsulitis of left shoulder: Secondary | ICD-10-CM | POA: Diagnosis not present

## 2016-04-25 DIAGNOSIS — Z5181 Encounter for therapeutic drug level monitoring: Secondary | ICD-10-CM

## 2016-04-25 DIAGNOSIS — M25512 Pain in left shoulder: Secondary | ICD-10-CM | POA: Insufficient documentation

## 2016-04-25 DIAGNOSIS — M47812 Spondylosis without myelopathy or radiculopathy, cervical region: Secondary | ICD-10-CM | POA: Diagnosis not present

## 2016-04-25 DIAGNOSIS — M7918 Myalgia, other site: Secondary | ICD-10-CM

## 2016-04-25 MED ORDER — OXYCODONE-ACETAMINOPHEN 5-325 MG PO TABS: 1.0000 | ORAL_TABLET | Freq: Three times a day (TID) | ORAL | 0 refills | Status: DC | PRN

## 2016-04-25 NOTE — Progress Notes (Signed)
Subjective:    Patient ID: Cassandra [name], female    DOB: 1956/06/26, 60 y.o.   MRN: [id number]  HPI: [name] is a 60 year old female who returns for follow up for chronic pain and medication refill. She states her pain is located in her neck,left shoulder, left arm and mid back on the left side. She rates her pain 6. Her current exercise regime is walking.   Pain Inventory Average Pain 7 Pain Right Now 6 My pain is burning, dull, stabbing, tingling and aching  In the last 24 hours, has pain interfered with the following? General activity 7 Relation with others 8 Enjoyment of life 8 What TIME of day is your pain at its worst? morning, daytime, night Sleep (in general) Poor  Pain is worse with: bending, sitting, standing and some activites Pain improves with: rest, heat/ice, pacing activities, medication and TENS Relief from Meds: 5  Mobility walk without assistance walk with assistance use a cane how many minutes can you walk? ? ability to climb steps?  yes do you drive?  yes needs help with transfers transfers alone  Function not employed: date last employed 2007 disabled: date disabled 2007 I need assistance with the following:  dressing, household duties and shopping  Neuro/Psych bladder control problems bowel control problems weakness numbness tremor tingling spasms dizziness confusion depression anxiety loss of taste or smell  Prior Studies Any changes since last visit?  no  Physicians involved in your care Any changes since last visit?  no   Family History  Problem Relation Age of Onset  . Coronary artery disease Mother   . Diabetes Mother     borderline  . Kidney disease Mother     stage 4  . Coronary artery disease Father   . Prostate cancer Father   . Lung disease Father     black lung  . Liver cancer Maternal Grandmother   . Liver disease Maternal Grandmother   . Colon cancer      Both sides  . Polymyalgia rheumatica  Sister   . Heart disease Sister   . Coronary artery disease Brother     heart attack  . COPD Brother   . Colon polyps Sister   . Lung cancer Paternal Grandmother    Social History   Social History  . Marital status: Married    Spouse name: N/A  . Number of children: 1  . Years of education: N/A   Occupational History  . Homemaker    Social History Main Topics  . Smoking status: Former Smoker    Packs/day: 1.00    Types: Cigarettes    Quit date: 06/01/2015  . Smokeless tobacco: Never Used     Comment: would like to try chantix, but has not started on it due to warnings related to depression  . Alcohol use No  . Drug use: No  . Sexual activity: Yes   Other Topics Concern  . Not on file   Social History Narrative  . No narrative on file   Past Surgical History:  Procedure Laterality Date  . ABDOMINAL HYSTERECTOMY     age 75  . ANAL RECTAL MANOMETRY N/A 12/09/2015   Procedure: ANO RECTAL MANOMETRY;  Surgeon: Mauri Pole, MD;  Location: [facility];  Service: Endoscopy;  Laterality: N/A;  . BLADDER REPAIR     sling/with perforation  . CESAREAN SECTION     1 time  . KNEE ARTHROSCOPY Left   .  ruptured uterus    . SHOULDER ARTHROSCOPY     multiple   Past Medical History:  Diagnosis Date  . Anxiety   . Bowel habit changes    excessive gas /frequent BM's with change in shape with pus  . Chronic left shoulder pain    frozen   . Chronic pain    left upper shoulder ,neck and back  . COPD (chronic obstructive pulmonary disease) ([place])    Dr. Redge Gainer  . Depression   . Hyperlipidemia   . Maternal complication related to childbirth    Sepsis, staph  . Meniere's disease   . Nicotine addiction    2 packs per day   . Rapid or irregular heartbeat   . Skin cancer    Rt. leg, squamous cell  . Tubular adenoma of colon 01/31/2000   Dr. Dellis Filbert Medoff   BP 119/78 (BP Location: Right Arm, Patient Position: Sitting, Cuff Size: Normal)   Pulse 71   SpO2 95%    Opioid Risk Score:   Fall Risk Score:  `1  Depression screen PHQ 2/9  Depression screen [facility] 2/9 03/24/2016 03/14/2016 03/10/2016 12/28/2015 12/07/2015 06/04/2015 03/09/2015  Decreased Interest 0 0 0 2 0 2 2  Down, Depressed, Hopeless 0 0 0 1 0 1 1  PHQ - 2 Score 0 0 0 3 0 3 3  Altered sleeping - - - - - - 3  Tired, decreased energy - - - - - - 3  Change in appetite - - - - - - 3  Feeling bad or failure about yourself  - - - - - - 0  Trouble concentrating - - - - - - 2  Moving slowly or fidgety/restless - - - - - - 2  Suicidal thoughts - - - - - - 0  PHQ-9 Score - - - - - - 16  Difficult doing work/chores - - - - - - -    Review of Systems  Constitutional: Positive for appetite change, diaphoresis and unexpected weight change.  Eyes: Negative.   Respiratory: Positive for shortness of breath.   Gastrointestinal: Positive for abdominal pain, constipation and diarrhea.  Endocrine: Negative.   Genitourinary: Positive for difficulty urinating.  Musculoskeletal: Positive for arthralgias and myalgias.       Spasms   Skin: Positive for rash.  Allergic/Immunologic: Negative.   Neurological: Positive for dizziness, tremors, weakness and numbness.       Tingling   Psychiatric/Behavioral: Positive for confusion and dysphoric mood. The patient is nervous/anxious.   All other systems reviewed and are negative.      Objective:   Physical Exam  Constitutional: She is oriented to person, place, and time. She appears well-developed and well-nourished.  HENT:  Head: Normocephalic and atraumatic.  Neck: Normal range of motion. Neck supple.  Cardiovascular: Normal rate and regular rhythm.   Pulmonary/Chest: Effort normal and breath sounds normal.  Musculoskeletal:  Normal Muscle Bulk and Muscle Testing Reveals: Upper Extremities: Right: Full ROM and Muscle Strength 5/5 Left: Decreased ROM 35 Degrees Left AC Joint Tenderness Thoracic Paraspinal Tenderness: T-1- T-3 Mainly Left Side Lower  Extremities: Full ROM and Muscle Strength 5/5 Arises from Table with ease Narrow based Gait  Neurological: She is alert and oriented to person, place, and time.  Skin: Skin is warm and dry.  Psychiatric: She has a normal mood and affect.  Nursing note and vitals reviewed.         Assessment & Plan:  1. Post-traumatic  arthritis left shoulder s/p 2 shoulder surgeries. Now with adhesive capsulitis left shoulder. :  Refilled:oxyCODONE 5/325mg  one tablet every 8 hours as needed. #70. Second script given for the following month. 2. Cervicalgia, spondylosis: Continue Current Medication Regime.Lidoderm patches, Robaxin, Naprosyn, and Gabapentin 3. Depression/insomnia/anxiety: Continue Lexapro. Continue Ambien 5 mg HS #30. Dr. Livia Snellen prescribing Xanax.    20 minutes of face to face patient care time was spent during this visit. All questions were encouraged and answered  F/U in 2 months

## 2016-04-27 ENCOUNTER — Ambulatory Visit (INDEPENDENT_AMBULATORY_CARE_PROVIDER_SITE_OTHER): Payer: Medicare Other

## 2016-04-27 ENCOUNTER — Ambulatory Visit (INDEPENDENT_AMBULATORY_CARE_PROVIDER_SITE_OTHER): Payer: Medicare Other | Admitting: Pharmacist

## 2016-04-27 ENCOUNTER — Encounter: Payer: Self-pay | Admitting: Pharmacist

## 2016-04-27 VITALS — BP 132/72 | HR 78 | Ht 66.0 in | Wt 153.0 lb

## 2016-04-27 DIAGNOSIS — Z87891 Personal history of nicotine dependence: Secondary | ICD-10-CM

## 2016-04-27 DIAGNOSIS — Z780 Asymptomatic menopausal state: Secondary | ICD-10-CM | POA: Diagnosis not present

## 2016-04-27 DIAGNOSIS — Z0000 Encounter for general adult medical examination without abnormal findings: Secondary | ICD-10-CM | POA: Diagnosis not present

## 2016-04-27 NOTE — Patient Instructions (Addendum)
Cassandra [name] , Thank you for taking time to come for your Medicare Wellness Visit. I appreciate your ongoing commitment to your health goals. Please review the following plan we discussed and let me know if I can assist you in the future.   These are the goals we discussed:  Call at end of August for appointment to have flu vaccine  Try to increase physical activity - try to start walking with sister at [facility]  I am sending referral for lung CT to [place] to monitor area of leg where you had boil - if continue to have concerns then call office for appointment  Follow up with your regular provider regarding tremors    This is a list of the screening recommended for you and due dates:  Health Maintenance  Topic Date Due  . Flu Shot  04/12/2016  . Shingles Vaccine  09/24/2016*  . Mammogram  02/03/2017  . Tetanus Vaccine  05/13/2018  . Pap Smear  03/25/2019  . Colon Cancer Screening  08/04/2025  .  Hepatitis C: One time screening is recommended by Center for Disease Control  (CDC) for  adults born from [birth year] through 1965.   Completed  . HIV Screening  Completed  *Topic was postponed. The date shown is not the original due date.   Fall Prevention in the Home  Falls can cause injuries and can affect people from all age groups. There are many simple things that you can do to make your home safe and to help prevent falls. WHAT CAN I DO ON THE OUTSIDE OF MY HOME?  Regularly repair the edges of walkways and driveways and fix any cracks.  Remove high doorway thresholds.  Trim any shrubbery on the main path into your home.  Use bright outdoor lighting.  Clear walkways of debris and clutter, including tools and rocks.  Regularly check that handrails are securely fastened and in good repair. Both sides of any steps should have handrails.  Install guardrails along the edges of any raised decks or porches.  Have leaves, snow, and ice cleared regularly.  Use sand or  salt on walkways during winter months.  In the garage, clean up any spills right away, including grease or oil spills. WHAT CAN I DO IN THE BATHROOM?  Use night lights.  Install grab bars by the toilet and in the tub and shower. Do not use towel bars as grab bars.  Use non-skid mats or decals on the floor of the tub or shower.  If you need to sit down while you are in the shower, use a plastic, non-slip stool.[place] [employer]  Keep the floor dry. Immediately clean up any water that spills on the floor.  Remove soap buildup in the tub or shower on a regular basis.  Attach bath mats securely with double-sided non-slip rug tape.  Remove throw rugs and other tripping hazards from the floor. WHAT CAN I DO IN THE BEDROOM?  Use night lights.  Make sure that a bedside light is easy to reach.  Do not use oversized bedding that drapes onto the floor.  Have a firm chair that has side arms to use for getting dressed.  Remove throw rugs and other tripping hazards from the floor. WHAT CAN I DO IN THE KITCHEN?   Clean up any spills right away.  Avoid walking on wet floors.  Place frequently used items in easy-to-reach places.  If you need to reach for something above you, use a sturdy  step stool that has a grab bar.  Keep electrical cables out of the way.  Do not use floor polish or wax that makes floors slippery. If you have to use wax, make sure that it is non-skid floor wax.  Remove throw rugs and other tripping hazards from the floor. WHAT CAN I DO IN THE STAIRWAYS?  Do not leave any items on the stairs.  Make sure that there are handrails on both sides of the stairs. Fix handrails that are broken or loose. Make sure that handrails are as long as the stairways.  Check any carpeting to make sure that it is firmly attached to the stairs. Fix any carpet that is loose or worn.  Avoid having throw rugs at the top or bottom of stairways, or secure the rugs with carpet tape to prevent them from  moving.  Make sure that you have a light switch at the top of the stairs and the bottom of the stairs. If you do not have them, have them installed. WHAT ARE SOME OTHER FALL PREVENTION TIPS?  Wear closed-toe shoes that fit well and support your feet. Wear shoes that have rubber soles or low heels.  When you use a stepladder, make sure that it is completely opened and that the sides are firmly locked. Have someone hold the ladder while you are using it. Do not climb a closed stepladder.  Add color or contrast paint or tape to grab bars and handrails in your home. Place contrasting color strips on the first and last steps.  Use mobility aids as needed, such as canes, walkers, scooters, and crutches.  Turn on lights if it is dark. Replace any light bulbs that burn out.  Set up furniture so that there are clear paths. Keep the furniture in the same spot.  Fix any uneven floor surfaces.  Choose a carpet design that does not hide the edge of steps of a stairway.  Be aware of any and all pets.  Review your medicines with your healthcare provider. Some medicines can cause dizziness or changes in blood pressure, which increase your risk of falling. Talk with your health care provider about other ways that you can decrease your risk of falls. This may include working with a physical therapist or trainer to improve your strength, balance, and endurance.   This information is not intended to replace advice given to you by your health care provider. Make sure you discuss any questions you have with your health care provider.   Document Released: 08/19/2002 Document Revised: 01/13/2015 Document Reviewed: 10/03/2014 Elsevier Interactive Patient Education 2016 [place] Maintenance, Female Adopting a healthy lifestyle and getting preventive care can go a long way to promote health and wellness. Talk with your health care provider about what schedule of regular examinations is right for  you. This is a good chance for you to check in with your provider about disease prevention and staying healthy. In between checkups, there are plenty of things you can do on your own. Experts have done a lot of research about which lifestyle changes and preventive measures are most likely to keep you healthy. Ask your health care provider for more information. WEIGHT AND DIET  Eat a healthy diet  Be sure to include plenty of vegetables, fruits, low-fat dairy products, and lean protein.  Do not eat a lot of foods high in solid fats, added sugars, or salt.  Get regular exercise. This is one of the most important things  you can do for your health.  Most adults should exercise for at least 150 minutes each week. The exercise should increase your heart rate and make you sweat (moderate-intensity exercise).  Most adults should also do strengthening exercises at least twice a week. This is in addition to the moderate-intensity exercise.  Maintain a healthy weight  Body mass index (BMI) is a measurement that can be used to identify possible weight problems. It estimates body fat based on height and weight. Your health care provider can help determine your BMI and help you achieve or maintain a healthy weight.  For females 58 years of age and older:   A BMI below 18.5 is considered underweight.  A BMI of 18.5 to 24.9 is normal.  A BMI of 25 to 29.9 is considered overweight.  A BMI of 30 and above is considered obese.  Watch levels of cholesterol and blood lipids  You should start having your blood tested for lipids and cholesterol at 59 years of age, then have this test every 5 years.  You may need to have your cholesterol levels checked more often if:  Your lipid or cholesterol levels are high.  You are older than 60 years of age.  You are at high risk for heart disease.  CANCER SCREENING   Lung Cancer  Lung cancer screening is recommended for adults 66-48 years old who are at  high risk for lung cancer because of a history of smoking.  A yearly low-dose CT scan of the lungs is recommended for people who:  Currently smoke.  Have quit within the past 15 years.  Have at least a 30-pack-year history of smoking. A pack year is smoking an average of one pack of cigarettes a day for 1 year.  Yearly screening should continue until it has been 15 years since you quit.  Yearly screening should stop if you develop a health problem that would prevent you from having lung cancer treatment.  Breast Cancer  Practice breast self-awareness. This means understanding how your breasts normally appear and feel.  It also means doing regular breast self-exams. Let your health care provider know about any changes, no matter how small.  If you are in your 20s or 30s, you should have a clinical breast exam (CBE) by a health care provider every 1-3 years as part of a regular health exam.  If you are 24 or older, have a CBE every year. Also consider having a breast X-ray (mammogram) every year.  If you have a family history of breast cancer, talk to your health care provider about genetic screening.  If you are at high risk for breast cancer, talk to your health care provider about having an MRI and a mammogram every year.  Breast cancer gene (BRCA) assessment is recommended for women who have family members with BRCA-related cancers. BRCA-related cancers include:  Breast.  Ovarian.  Tubal.  Peritoneal cancers.  Results of the assessment will determine the need for genetic counseling and BRCA1 and BRCA2 testing. Cervical Cancer Your health care provider may recommend that you be screened regularly for cancer of the pelvic organs (ovaries, uterus, and vagina). This screening involves a pelvic examination, including checking for microscopic changes to the surface of your cervix (Pap test). You may be encouraged to have this screening done every 3 years, beginning at age  73.  For women ages 22-65, health care providers may recommend pelvic exams and Pap testing every 3 years, or they may recommend the  Pap and pelvic exam, combined with testing for human papilloma virus (HPV), every 5 years. Some types of HPV increase your risk of cervical cancer. Testing for HPV may also be done on women of any age with unclear Pap test results.  Other health care providers may not recommend any screening for nonpregnant women who are considered low risk for pelvic cancer and who do not have symptoms. Ask your health care provider if a screening pelvic exam is right for you.  If you have had past treatment for cervical cancer or a condition that could lead to cancer, you need Pap tests and screening for cancer for at least 20 years after your treatment. If Pap tests have been discontinued, your risk factors (such as having a new sexual partner) need to be reassessed to determine if screening should resume. Some women have medical problems that increase the chance of getting cervical cancer. In these cases, your health care provider may recommend more frequent screening and Pap tests. Colorectal Cancer  This type of cancer can be detected and often prevented.  Routine colorectal cancer screening usually begins at 60 years of age and continues through 60 years of age.  Your health care provider may recommend screening at an earlier age if you have risk factors for colon cancer.  Your health care provider may also recommend using home test kits to check for hidden blood in the stool.  A small camera at the end of a tube can be used to examine your colon directly (sigmoidoscopy or colonoscopy). This is done to check for the earliest forms of colorectal cancer.  Routine screening usually begins at age 53.  Direct examination of the colon should be repeated every 5-10 years through 60 years of age. However, you may need to be screened more often if early forms of precancerous polyps  or small growths are found. Skin Cancer  Check your skin from head to toe regularly.  Tell your health care provider about any new moles or changes in moles, especially if there is a change in a mole's shape or color.  Also tell your health care provider if you have a mole that is larger than the size of a pencil eraser.  Always use sunscreen. Apply sunscreen liberally and repeatedly throughout the day.  Protect yourself by wearing long sleeves, pants, a wide-brimmed hat, and sunglasses whenever you are outside. HEART DISEASE, DIABETES, AND HIGH BLOOD PRESSURE   High blood pressure causes heart disease and increases the risk of stroke. High blood pressure is more likely to develop in:  People who have blood pressure in the high end of the normal range (130-139/85-89 mm Hg).  People who are overweight or obese.  People who are African American.  If you are 73-73 years of age, have your blood pressure checked every 3-5 years. If you are 54 years of age or older, have your blood pressure checked every year. You should have your blood pressure measured twice--once when you are at a hospital or clinic, and once when you are not at a hospital or clinic. Record the average of the two measurements. To check your blood pressure when you are not at a hospital or clinic, you can use:  An automated blood pressure machine at a pharmacy.  A home blood pressure monitor.  If you are between 5 years and 7 years old, ask your health care provider if you should take aspirin to prevent strokes.  Have regular diabetes screenings. This involves taking  a blood sample to check your fasting blood sugar level.  If you are at a normal weight and have a low risk for diabetes, have this test once every three years after 60 years of age.  If you are overweight and have a high risk for diabetes, consider being tested at a younger age or more often. PREVENTING INFECTION  Hepatitis B  If you have a higher  risk for hepatitis B, you should be screened for this virus. You are considered at high risk for hepatitis B if:  You were born in a country where hepatitis B is common. Ask your health care provider which countries are considered high risk.  Your parents were born in a high-risk country, and you have not been immunized against hepatitis B (hepatitis B vaccine).  You have HIV or AIDS.  You use needles to inject street drugs.  You live with someone who has hepatitis B.  You have had sex with someone who has hepatitis B.  You get hemodialysis treatment.  You take certain medicines for conditions, including cancer, organ transplantation, and autoimmune conditions. Hepatitis C  Blood testing is recommended for:  Everyone born from [birth year] through 1965.  Anyone with known risk factors for hepatitis C. Sexually transmitted infections (STIs)  You should be screened for sexually transmitted infections (STIs) including gonorrhea and chlamydia if:  You are sexually active and are younger than 60 years of age.  You are older than 60 years of age and your health care provider tells you that you are at risk for this type of infection.  Your sexual activity has changed since you were last screened and you are at an increased risk for chlamydia or gonorrhea. Ask your health care provider if you are at risk.  If you do not have HIV, but are at risk, it may be recommended that you take a prescription medicine daily to prevent HIV infection. This is called pre-exposure prophylaxis (PrEP). You are considered at risk if:  You are sexually active and do not regularly use condoms or know the HIV status of your partner(s).  You take drugs by injection.  You are sexually active with a partner who has HIV. Talk with your health care provider about whether you are at high risk of being infected with HIV. If you choose to begin PrEP, you should first be tested for HIV. You should then be tested every 3  months for as long as you are taking PrEP.  PREGNANCY   If you are premenopausal and you may become pregnant, ask your health care provider about preconception counseling.  If you may become pregnant, take 400 to 800 micrograms (mcg) of folic acid every day.  If you want to prevent pregnancy, talk to your health care provider about birth control (contraception). OSTEOPOROSIS AND MENOPAUSE   Osteoporosis is a disease in which the bones lose minerals and strength with aging. This can result in serious bone fractures. Your risk for osteoporosis can be identified using a bone density scan.  If you are 78 years of age or older, or if you are at risk for osteoporosis and fractures, ask your health care provider if you should be screened.  Ask your health care provider whether you should take a calcium or vitamin D supplement to lower your risk for osteoporosis.  Menopause may have certain physical symptoms and risks.  Hormone replacement therapy may reduce some of these symptoms and risks. Talk to your health care provider about whether hormone  replacement therapy is right for you.  HOME CARE INSTRUCTIONS   Schedule regular health, dental, and eye exams.  Stay current with your immunizations.   Do not use any tobacco products including cigarettes, chewing tobacco, or electronic cigarettes.  If you are pregnant, do not drink alcohol.  If you are breastfeeding, limit how much and how often you drink alcohol.  Limit alcohol intake to no more than 1 drink per day for nonpregnant women. One drink equals 12 ounces of beer, 5 ounces of wine, or 1 ounces of hard liquor.  Do not use street drugs.  Do not share needles.  Ask your health care provider for help if you need support or information about quitting drugs.  Tell your health care provider if you often feel depressed.  Tell your health care provider if you have ever been abused or do not feel safe at home.   This information is  not intended to replace advice given to you by your health care provider. Make sure you discuss any questions you have with your health care provider.   Document Released: 03/14/2011 Document Revised: 09/19/2014 Document Reviewed: 07/31/2013 Elsevier Interactive Patient Education [employer].

## 2016-04-28 NOTE — Progress Notes (Signed)
Subjective:   Cassandra [name] is a 60 y.o. female who presents for a subsequent Medicare Annual Wellness Visit.  She is married.  She reports that she has moved within the last year after her mother's death.  She now lives next door to her sister and she likes this much better.  Prior to her disability, she worked in [employer] and [place].   She has chronic neck pain with left arm radiculopathy for which she is followed by pain management.    Review of Systems  Review of Systems  Constitutional: Negative.   HENT: Negative.   Eyes: Negative.   Respiratory: Negative.   Cardiovascular: Negative.   Gastrointestinal:       Repots continued episodes of loss of control of stool - about once per month. She has seen GI about this in past. Taking Bentyl currently and has improved    Genitourinary: Positive for urgency.  Musculoskeletal: Positive for joint pain, myalgias and neck pain.  Skin: Negative.   Neurological: Positive for tremors.  Psychiatric/Behavioral: Positive for depression. The patient is nervous/anxious.      Current Medications (verified) Outpatient Encounter Prescriptions as of 04/27/2016  Medication Sig  . ALPRAZolam (XANAX) 1 MG tablet Take one tablet twice daily for Meniere's Disease (Patient taking differently: Take 1 mg by mouth 2 (two) times daily as needed for anxiety. Take one tablet twice daily for Meniere's Disease)  . conjugated estrogens (PREMARIN) vaginal cream Place vaginally daily.  Marland Kitchen dicyclomine (BENTYL) 10 MG capsule Take 1 capsule (10 mg total) by mouth 4 (four) times daily -  before meals and at bedtime.  Marland Kitchen escitalopram (LEXAPRO) 20 MG tablet Take 1 tablet (20 mg total) by mouth at bedtime.  . fexofenadine (ALLEGRA) 180 MG tablet Take 1 tablet (180 mg total) by mouth daily as needed for allergies.  Marland Kitchen gabapentin (NEURONTIN) 100 MG capsule TAKE 1 CAPSULE (100 MG TOTAL) BY MOUTH 2 (TWO) TIMES DAILY.  Marland Kitchen gabapentin (NEURONTIN) 300 MG capsule TAKE 1  CAPSULE (300 MG TOTAL) BY MOUTH 3 (THREE) TIMES DAILY.  . hydroxypropyl methylcellulose / hypromellose (ISOPTO TEARS / GONIOVISC) 2.5 % ophthalmic solution Place 1 drop into both eyes as needed for dry eyes.  Marland Kitchen lidocaine (LIDODERM) 5 % Place 1 patch onto the skin as needed. Remove & Discard patch within 12 hours or as directed by MD   . meclizine (ANTIVERT) 25 MG tablet TAKE 1 TABLET (25 MG TOTAL) BY MOUTH 2 (TWO) TIMES DAILY AS NEEDED.  . methocarbamol (ROBAXIN) 500 MG tablet TAKE 1 TABLET EVERY 6 HOURS AS NEEDED FOR MUSCLE SPASMS  **LAST FOR ONE MONTH**  . naproxen (NAPROSYN) 500 MG tablet TAKE 1 TABLET (500 MG TOTAL) BY MOUTH 2 (TWO) TIMES DAILY WITH A MEAL.  Marland Kitchen omeprazole (PRILOSEC) 20 MG capsule TAKE 1 CAPSULE (20 MG TOTAL) BY MOUTH DAILY. (Patient taking differently: Take 20 mg by mouth daily as needed (Acid reflux). )  . oxyCODONE-acetaminophen (PERCOCET) 5-325 MG tablet Take 1 tablet by mouth every 8 (eight) hours as needed.  . zolpidem (AMBIEN) 5 MG tablet Take 1 tablet (5 mg total) by mouth at bedtime as needed.  . [DISCONTINUED] sulfamethoxazole-trimethoprim (BACTRIM DS) 800-160 MG tablet Take 1 tablet by mouth 2 (two) times daily. (Patient not taking: Reported on 04/27/2016)   No facility-administered encounter medications on file as of 04/27/2016.     Allergies (verified) Chantix [varenicline]; Codeine; Cymbalta [duloxetine hcl]; Livalo [pitavastatin]; Metoprolol; Oxycodone-aspirin; Oxycontin [oxycodone hcl]; Propoxyphene n-acetaminophen; Trazodone and nefazodone; Zithromax [  azithromycin]; Other; and Oxycodone-acetaminophen   History: Past Medical History:  Diagnosis Date  . Anxiety   . Bowel habit changes    excessive gas /frequent BM's with change in shape with pus  . Chronic left shoulder pain    frozen   . Chronic pain    left upper shoulder ,neck and back  . COPD (chronic obstructive pulmonary disease) ([place])    Dr. Redge Gainer  . Depression   . GERD (gastroesophageal  reflux disease)   . Hyperlipidemia   . Maternal complication related to childbirth    Sepsis, staph  . Meniere's disease   . Nicotine addiction    2 packs per day   . Rapid or irregular heartbeat   . Skin cancer    Rt. leg, squamous cell  . Tubular adenoma of colon 01/31/2000   Dr. Richmond Campbell  . Weakness of pelvic floor    prolapse -  MRI from 2017   Past Surgical History:  Procedure Laterality Date  . ABDOMINAL HYSTERECTOMY     age 65  . ANAL RECTAL MANOMETRY N/A 12/09/2015   Procedure: ANO RECTAL MANOMETRY;  Surgeon: Mauri Pole, MD;  Location: [facility];  Service: Endoscopy;  Laterality: N/A;  . BLADDER REPAIR     sling/with perforation  . CESAREAN SECTION     1 time  . KNEE ARTHROSCOPY Left   . ruptured uterus    . SHOULDER ARTHROSCOPY     multiple   Family History  Problem Relation Age of Onset  . Coronary artery disease Mother   . Diabetes Mother     borderline  . Kidney disease Mother     stage 4  . Coronary artery disease Father   . Prostate cancer Father   . Lung disease Father     black lung  . Liver cancer Maternal Grandmother   . Liver disease Maternal Grandmother   . Polymyalgia rheumatica Sister   . Heart disease Sister   . Coronary artery disease Brother     heart attack  . COPD Brother   . Colon polyps Sister   . Lung cancer Paternal Grandmother   . Colon cancer      Both sides   Social History   Occupational History  . Homemaker    Social History Main Topics  . Smoking status: Former Smoker    Packs/day: 1.00    Years: 42.00    Types: Cigarettes    Quit date: 06/01/2015  . Smokeless tobacco: Never Used       . Alcohol use No  . Drug use: No  . Sexual activity: Yes    Do you feel safe at home?  Yes  Dietary issues and exercise activities: Current Exercise Habits: The patient does not participate in regular exercise at present, Exercise limited by: orthopedic condition(s);neurologic condition(s)  Current Dietary  habits:  Weight has increased since stopping smoking about 1 year ago.  No following any specific diet.   Objective:    Today's Vitals   04/27/16 1539  BP: 132/72  Pulse: 78  Weight: 153 lb (69.4 kg)  Height: 5\' 6"  (1.676 m)  PainSc: 8    Body mass index is 24.69 kg/m.  Activities of Daily Living In your present state of health, do you have any difficulty performing the following activities: 04/27/2016 03/24/2016  Hearing? N N  Vision? N N  Difficulty concentrating or making decisions? Y N  Walking or climbing stairs? Y N  Dressing or bathing?  N N  Doing errands, shopping? N N  Preparing Food and eating ? N -  Using the Toilet? N -  In the past six months, have you accidently leaked urine? N -  Do you have problems with loss of bowel control? Y -  Managing your Medications? N -  Managing your Finances? N -  Housekeeping or managing your Housekeeping? N -  Some recent data might be hidden     Cardiac Risk Factors include: advanced age (>61men, >42 women);sedentary lifestyle;smoking/ tobacco exposure  Depression Screen PHQ 2/9 Scores 04/27/2016 03/24/2016 03/14/2016 03/10/2016  PHQ - 2 Score 2 0 0 0  PHQ- 9 Score 8 - - -  Exception Documentation - - - -  Not completed - - - -     Fall Risk Fall Risk  04/27/2016 04/25/2016 03/24/2016 03/14/2016 03/10/2016  Falls in the past year? Yes Yes No No No  Number falls in past yr: 1 2 or more - - -  Injury with Fall? No No - - -  Risk Factor Category  - - - - -  Risk for fall due to : - - - - -  Risk for fall due to (comments): - - - - -  Follow up Falls prevention discussed - - - -    Cognitive Function: MMSE - Mini Mental State Exam 04/27/2016 01/13/2015  Orientation to time 5 5  Orientation to Place 5 5  Registration 3 3  Attention/ Calculation 5 3  Recall 2 3  Language- name 2 objects 2 2  Language- repeat - 1  Language- follow 3 step command - 3  Language- read & follow direction - 1  Write a sentence - 1  Copy design - 1    Total score - 28    Immunizations and Health Maintenance Immunization History  Administered Date(s) Administered  . Influenza Split 06/21/2010  . Influenza, Seasonal, Injecte, Preservative Fre 06/25/2014  . Influenza,inj,Quad PF,36+ Mos 07/09/2013, 06/09/2015  . Td 05/13/2008   Health Maintenance Due  Topic Date Due  . INFLUENZA VACCINE  04/12/2016    Patient Care Team: [name], MD as PCP - General (Family Medicine) Meredith Staggers, MD as Consulting Physician (Physical Medicine and Rehabilitation)   Dr Carola Frost and Dr Jeffie Pollock - has consulted with both in past regarding urinary frequency and bladder sling  Indicate any recent Medical Services you may have received from other than [facility] providers in the past year (date may be approximate).    Assessment:    Annual Wellness Visit  History of smoking - quit 2016, greater than 40 pack year history of smoking Post menopausal female - ??osteopenia   Screening Tests Health Maintenance  Topic Date Due  . INFLUENZA VACCINE  04/12/2016  . ZOSTAVAX  09/24/2016 (Originally 12/08/2015)  . MAMMOGRAM  02/03/2017  . TETANUS/TDAP  05/13/2018  . PAP SMEAR  03/25/2019  . COLONOSCOPY  08/04/2025  . Hepatitis C Screening  Completed  . HIV Screening  Completed        Plan:   During the course of the visit Cassandra [name] was educated and counseled about the following appropriate screening and preventive services:   Vaccines to include Influenza, Td, Zostavxa -  Patient declined Zostavax (has not had chicken pox in past).  Reminded to get influenza vaccines within the next 1-2 months.  Colorectal cancer screening - colonoscopy is UTD  Cardiovascular disease screening - last EKG was 10/01/2014.  Last LDL was elevated at 173.  She has been intolerant of statins in past.  Discussed low fat diet in office today. Will try for 3 months and then recheck lipids.  Diabetes screening - UTD and WNL  Bone Denisty / Osteoporosis Screening -  rechecked today.  Results pending  Mammogram - scheduled for 05/2016 but patient had conflict - moved to [id number]  Fall prevention discussed - continue to use cane to ambulate  PAP - UTD  Glaucoma screening / Eye Exam - goes every 2 years - due in 1-2 months  Nutrition counseling -See above - discussed low fat diet which should also help to keep weight stable.  Smoking cessation counseling - continue to not smoke - great job  Greater than 40 pack year history of smoking - CT of lung ordered  Advanced Directives - information given and reviewed with patinet  Physical Activity - patient to start walking with sister  Recommended patient follow up with PCP regarding tremor    Patient Instructions (the written plan) were given to the patient.   Cherre Robins, PharmD   04/28/2016

## 2016-05-16 ENCOUNTER — Other Ambulatory Visit: Payer: Self-pay | Admitting: Registered Nurse

## 2016-05-24 ENCOUNTER — Ambulatory Visit (HOSPITAL_COMMUNITY)
Admission: RE | Admit: 2016-05-24 | Discharge: 2016-05-24 | Disposition: A | Discharge status: Home / Self Care | Payer: Medicare Other | Source: Ambulatory Visit | Attending: Pharmacist | Admitting: Pharmacist

## 2016-05-24 ENCOUNTER — Other Ambulatory Visit: Payer: Self-pay | Admitting: *Deleted

## 2016-05-24 DIAGNOSIS — Z87891 Personal history of nicotine dependence: Secondary | ICD-10-CM | POA: Insufficient documentation

## 2016-05-24 DIAGNOSIS — I2510 Atherosclerotic heart disease of native coronary artery without angina pectoris: Secondary | ICD-10-CM | POA: Diagnosis not present

## 2016-05-24 DIAGNOSIS — I700 Atherosclerosis of aorta: Secondary | ICD-10-CM | POA: Diagnosis not present

## 2016-05-24 DIAGNOSIS — J439 Emphysema, unspecified: Secondary | ICD-10-CM | POA: Insufficient documentation

## 2016-05-24 DIAGNOSIS — K589 Irritable bowel syndrome without diarrhea: Secondary | ICD-10-CM

## 2016-05-24 MED ORDER — DICYCLOMINE HCL 10 MG PO CAPS: 10.0000 mg | ORAL_CAPSULE | Freq: Three times a day (TID) | ORAL | 1 refills | Status: DC

## 2016-05-30 ENCOUNTER — Encounter: Payer: Self-pay | Admitting: Pharmacist

## 2016-05-30 DIAGNOSIS — I700 Atherosclerosis of aorta: Secondary | ICD-10-CM | POA: Insufficient documentation

## 2016-05-30 DIAGNOSIS — J439 Emphysema, unspecified: Secondary | ICD-10-CM | POA: Insufficient documentation

## 2016-06-06 ENCOUNTER — Telehealth: Payer: Self-pay | Admitting: Family

## 2016-06-06 ENCOUNTER — Encounter: Payer: Medicare Other | Admitting: *Deleted

## 2016-06-06 NOTE — Telephone Encounter (Signed)
Pt  Needs appt.

## 2016-06-07 ENCOUNTER — Ambulatory Visit (INDEPENDENT_AMBULATORY_CARE_PROVIDER_SITE_OTHER): Payer: Medicare Other | Admitting: Family Medicine

## 2016-06-07 ENCOUNTER — Encounter: Payer: Self-pay | Admitting: Family Medicine

## 2016-06-07 VITALS — BP 134/74 | HR 60 | Temp 97.4°F | Ht 66.0 in | Wt 152.4 lb

## 2016-06-07 DIAGNOSIS — L732 Hidradenitis suppurativa: Secondary | ICD-10-CM | POA: Diagnosis not present

## 2016-06-07 MED ORDER — MUPIROCIN 2 % EX OINT: 1.0000 "application " | TOPICAL_OINTMENT | Freq: Two times a day (BID) | CUTANEOUS | 0 refills | Status: DC

## 2016-06-07 MED ORDER — CHLORHEXIDINE GLUCONATE 4 % EX LIQD: CUTANEOUS | 1 refills | Status: DC

## 2016-06-07 MED ORDER — DOXYCYCLINE HYCLATE 100 MG PO CAPS: 100.0000 mg | ORAL_CAPSULE | Freq: Two times a day (BID) | ORAL | 0 refills | Status: DC

## 2016-06-07 NOTE — Telephone Encounter (Signed)
Appointment made to see Dr. Livia Snellen today 09/26

## 2016-06-07 NOTE — Progress Notes (Signed)
Subjective:  Patient ID: Cassandra [name], female    DOB: May 30, 1956  Age: 60 y.o. MRN: [id number]  CC: Recurrent Skin Infections (pt here today c/o what she thinks are staff infections in both axilary regions)   HPI [name] presents for Onset 2-3 days ago with increasing pain and redness and swelling under the axilla. These are bilateral. She is concerned because she had staph in her lower abdomen several months ago and a couple months ago had one on the left thigh. The ones on the abdomen had to be lanced and packed repeatedly. She wants to avoid that complication at all possible at this time she claims that the pain is so bad she can't lift the left arm. The right is moderately tender. There is no fevers there are no chills or sweats. She says that she recently was treated for this with doxycycline and it worked very well for her. She is concerned about recurrence and would like to do something to prevent any further recurrences.   History Lillianah has a past medical history of Anxiety; Bowel habit changes; Chronic left shoulder pain; Chronic pain; COPD (chronic obstructive pulmonary disease) ([place]); Depression; GERD (gastroesophageal reflux disease); Hyperlipidemia; Maternal complication related to childbirth; Meniere's disease; Nicotine addiction; Rapid or irregular heartbeat; Skin cancer; Tubular adenoma of colon (01/31/2000); and Weakness of pelvic floor.   She has a past surgical history that includes Bladder repair; Shoulder arthroscopy; Abdominal hysterectomy; Knee arthroscopy (Left); Cesarean section; ruptured uterus; and Anal Rectal manometry (N/A, 12/09/2015).   Her family history includes COPD in her brother; Colon polyps in her sister; Coronary artery disease in her brother, father, and mother; Diabetes in her mother; Heart disease in her sister; Kidney disease in her mother; Liver cancer in her maternal grandmother; Liver disease in her maternal grandmother; Lung cancer in her paternal  grandmother; Lung disease in her father; Polymyalgia rheumatica in her sister; Prostate cancer in her father.She reports that she quit smoking about a year ago. Her smoking use included Cigarettes. She has a 42.00 pack-year smoking history. She has never used smokeless tobacco. She reports that she does not drink alcohol or use drugs.    ROS Review of Systems  Constitutional: Negative for activity change, appetite change and fever.  HENT: Negative for congestion, rhinorrhea and sore throat.   Eyes: Negative for visual disturbance.  Respiratory: Negative for cough and shortness of breath.   Cardiovascular: Negative for chest pain and palpitations.  Gastrointestinal: Negative for abdominal pain, diarrhea and nausea.  Genitourinary: Negative for dysuria.  Musculoskeletal: Negative for arthralgias and myalgias.    Objective:  BP (!) 141/75   Pulse 60   Temp 97.4 F (36.3 C) (Oral)   Ht 5\' 6"  (1.676 m)   Wt 152 lb 6 oz (69.1 kg)   BMI 24.59 kg/m   BP Readings from Last 3 Encounters:  06/07/16 (!) 141/75  04/27/16 132/72  04/25/16 119/78    Wt Readings from Last 3 Encounters:  06/07/16 152 lb 6 oz (69.1 kg)  04/27/16 153 lb (69.4 kg)  03/24/16 148 lb 3.2 oz (67.2 kg)     Physical Exam  Constitutional: She is oriented to person, place, and time. She appears well-developed and well-nourished. No distress.  HENT:  Head: Normocephalic and atraumatic.  Eyes: Conjunctivae are normal. Pupils are equal, round, and reactive to light.  Neck: Normal range of motion. Neck supple. No thyromegaly present.  Cardiovascular: Normal rate, regular rhythm and normal heart sounds.  No murmur heard. Pulmonary/Chest: Effort normal and breath sounds normal. No respiratory distress. She has no wheezes. She has no rales.  Abdominal: Soft. Bowel sounds are normal. She exhibits no distension. There is no tenderness.  Musculoskeletal: Normal range of motion.  Lymphadenopathy:    She has no cervical  adenopathy.  Neurological: She is alert and oriented to person, place, and time.  Skin: Skin is warm and dry.  There is a 4 mm raised erythematous nodule with central head. The head is white measuring 1 mm. This is located near the crease of the right axilla anteriorly. There are 2 erythematous nodules on the left. One anterior is raised with induration and cyst formation measuring 1 cm. There is no head. This one is anterior in the left axilla. There is a 2 mm erythematous nodule at the posterior left axilla as well.  Psychiatric: She has a normal mood and affect. Her behavior is normal. Judgment and thought content normal.     Lab Results  Component Value Date   WBC 6.9 03/24/2016   HGB 14.7 12/10/2013   HCT 42.0 03/24/2016   PLT 312 03/24/2016   GLUCOSE 98 03/24/2016   CHOL 254 (H) 03/24/2016   TRIG 86 03/24/2016   HDL 64 03/24/2016   LDLCALC 173 (H) 03/24/2016   ALT 10 03/24/2016   AST 19 03/24/2016   NA 141 03/24/2016   K 4.4 03/24/2016   CL 98 03/24/2016   CREATININE 1.10 (H) 03/24/2016   BUN 21 03/24/2016   CO2 25 03/24/2016   TSH 0.602 03/24/2016    Ct Chest Lung Ca Screen Low Dose W/o Cm  Result Date: 05/25/2016 CLINICAL DATA:  60 year old female former smoker with 84 pack-year smoking history. Quit 1 year prior. EXAM: CT CHEST WITHOUT CONTRAST LOW-DOSE FOR LUNG CANCER SCREENING TECHNIQUE: Multidetector CT imaging of the chest was performed following the standard protocol without IV contrast. COMPARISON:  01/17/2013 chest CT. FINDINGS: Cardiovascular: Normal heart size. No significant pericardial fluid/thickening. Left main and left anterior descending coronary atherosclerosis. Atherosclerotic nonaneurysmal thoracic aorta. Normal caliber pulmonary arteries. Mediastinum/Nodes: No discrete thyroid nodules. Unremarkable esophagus. No pathologically enlarged axillary, mediastinal or gross hilar lymph nodes, noting limited sensitivity for the detection of hilar adenopathy on this  noncontrast study. Lungs/Pleura: No pneumothorax. No pleural effusion. Mild-to-moderate centrilobular and paraseptal emphysema. No acute consolidative airspace disease or significant pulmonary nodules. Pleural-parenchymal scarring at both lung apices is not appreciably changed back to 2014. Upper abdomen: Simple 1.2 cm right liver lobe cyst. Musculoskeletal: No aggressive appearing focal osseous lesions. Mild thoracic spondylosis. IMPRESSION: 1. Lung-RADS Category 1S, negative. Continue annual screening with low-dose chest CT without contrast in 12 months. 2. The "S" modifier above refers to potentially clinically significant non lung cancer related findings. Specifically, left main and 1 vessel coronary atherosclerosis. 3. Aortic atherosclerosis. 4. Mild to moderate emphysema. Electronically Signed   By: Ilona Sorrel M.D.   On: 05/25/2016 09:20    Assessment & Plan:   Maurene was seen today for recurrent skin infections.  Diagnoses and all orders for this visit:  Hidradenitis suppurativa of right axilla  Hidradenitis suppurativa of left axilla  Other orders -     doxycycline (VIBRAMYCIN) 100 MG capsule; Take 1 capsule (100 mg total) by mouth 2 (two) times daily. -     chlorhexidine (HIBICLENS) 4 % external liquid; Shower daily for 5 days. Apply all over, including scalp and leave on  5 min before rinsing. -     mupirocin ointment (  BACTROBAN) 2 %; Place 1 application into the nose 2 (two) times daily.      I am having Ms. Friedmann start on doxycycline, chlorhexidine, and mupirocin ointment. I am also having her maintain her lidocaine, ALPRAZolam, zolpidem, conjugated estrogens, escitalopram, fexofenadine, meclizine, omeprazole, hydroxypropyl methylcellulose / hypromellose, naproxen, gabapentin, gabapentin, methocarbamol, oxyCODONE-acetaminophen, methocarbamol, and dicyclomine.  Meds ordered this encounter  Medications  . doxycycline (VIBRAMYCIN) 100 MG capsule    Sig: Take 1 capsule (100 mg  total) by mouth 2 (two) times daily.    Dispense:  20 capsule    Refill:  0  . chlorhexidine (HIBICLENS) 4 % external liquid    Sig: Shower daily for 5 days. Apply all over, including scalp and leave on  5 min before rinsing.    Dispense:  236 mL    Refill:  1  . mupirocin ointment (BACTROBAN) 2 %    Sig: Place 1 application into the nose 2 (two) times daily.    Dispense:  22 g    Refill:  0     Follow-up: Return if symptoms worsen or fail to improve.  [name], M.D.

## 2016-06-09 ENCOUNTER — Encounter (HOSPITAL_COMMUNITY): Payer: Self-pay | Admitting: Emergency Medicine

## 2016-06-09 ENCOUNTER — Emergency Department (HOSPITAL_COMMUNITY)
Admission: EM | Admit: 2016-06-09 | Discharge: 2016-06-09 | Disposition: A | Discharge status: Home / Self Care | Payer: Medicare Other | Attending: Emergency Medicine | Admitting: Emergency Medicine

## 2016-06-09 DIAGNOSIS — J449 Chronic obstructive pulmonary disease, unspecified: Secondary | ICD-10-CM | POA: Diagnosis not present

## 2016-06-09 DIAGNOSIS — Z79899 Other long term (current) drug therapy: Secondary | ICD-10-CM | POA: Diagnosis not present

## 2016-06-09 DIAGNOSIS — Z87891 Personal history of nicotine dependence: Secondary | ICD-10-CM | POA: Insufficient documentation

## 2016-06-09 DIAGNOSIS — L02412 Cutaneous abscess of left axilla: Secondary | ICD-10-CM | POA: Diagnosis not present

## 2016-06-09 DIAGNOSIS — L0291 Cutaneous abscess, unspecified: Secondary | ICD-10-CM

## 2016-06-09 MED ORDER — LIDOCAINE HCL (PF) 2 % IJ SOLN
10.0000 mL | Freq: Once | INTRAMUSCULAR | Status: DC
  Filled 2016-06-09: qty 10

## 2016-06-09 NOTE — ED Triage Notes (Signed)
Pt reports L axillary abscess. Was seen by her pCP 2 days ago and placed on abx. Pt reports redness has spread into her breast and up to her shoulder.

## 2016-06-09 NOTE — ED Provider Notes (Signed)
[facility] DEPT Provider Note   CSN: [id number] Arrival date & time: 06/09/16  1916     History   Chief Complaint Chief Complaint  Patient presents with  . Abscess    HPI Cassandra [name] is a 60 y.o. female.  Reasons for evaluation of sore on both axilla, which is getting bigger and becoming more painful despite taking doxycycline. History same, several times requiring IV. PCP also put her on Hibiclens, and Hibiclens scrubs. She denies fever, chills, nausea, vomiting, weakness or dizziness. There are no other known modifying factors.  HPI  Past Medical History:  Diagnosis Date  . Anxiety   . Bowel habit changes    excessive gas /frequent BM's with change in shape with pus  . Chronic left shoulder pain    frozen   . Chronic pain    left upper shoulder ,neck and back  . COPD (chronic obstructive pulmonary disease) ([place])    Dr. Redge Gainer  . Depression   . GERD (gastroesophageal reflux disease)   . Hyperlipidemia   . Maternal complication related to childbirth    Sepsis, staph  . Meniere's disease   . Nicotine addiction    2 packs per day   . Rapid or irregular heartbeat   . Skin cancer    Rt. leg, squamous cell  . Tubular adenoma of colon 01/31/2000   Dr. Richmond Campbell  . Weakness of pelvic floor    prolapse -  MRI from 2017    Patient Active Problem List   Diagnosis Date Noted  . Aortic atherosclerosis ([place]) 05/30/2016  . Emphysema of lung ([place]) 05/30/2016  . Insomnia 03/24/2016  . Depression 03/24/2016  . GAD (generalized anxiety disorder) 03/24/2016  . IBS (irritable bowel syndrome) 12/28/2015  . Fecal incontinence   . C7 radiculopathy 06/17/2014  . Cervical spondylosis without myelopathy 12/18/2013  . Iliotibial band syndrome of right side 09/20/2013  . Adhesive capsulitis of left shoulder 09/20/2013  . Degenerative arthritis of left shoulder region 09/20/2013  . Myofascial pain 09/20/2013  . Personal history of colonic polyps 02/07/2013  . Loss  of weight 01/11/2013  . Hyperlipidemia 11/29/2010    Past Surgical History:  Procedure Laterality Date  . ABDOMINAL HYSTERECTOMY     age 35  . ANAL RECTAL MANOMETRY N/A 12/09/2015   Procedure: ANO RECTAL MANOMETRY;  Surgeon: Mauri Pole, MD;  Location: [facility];  Service: Endoscopy;  Laterality: N/A;  . BLADDER REPAIR     sling/with perforation  . CESAREAN SECTION     1 time  . KNEE ARTHROSCOPY Left   . ruptured uterus    . SHOULDER ARTHROSCOPY     multiple    OB History    Gravida Para Term Preterm AB Living   2 1 1     1    SAB TAB Ectopic Multiple Live Births                   Home Medications    Prior to Admission medications   Medication Sig Start Date End Date Taking? Authorizing Provider  ALPRAZolam Duanne Moron) 1 MG tablet Take one tablet twice daily for Meniere's Disease Patient taking differently: Take 1 mg by mouth 2 (two) times daily as needed for anxiety. Take one tablet twice daily for Meniere's Disease 12/07/15  Yes [name], MD  chlorhexidine (HIBICLENS) 4 % external liquid Shower daily for 5 days. Apply all over, including scalp and leave on  5 min before rinsing. 06/07/16  Yes  [name], MD  conjugated estrogens (PREMARIN) vaginal cream Place vaginally daily. 12/07/15  Yes [name], MD  dicyclomine (BENTYL) 10 MG capsule Take 1 capsule (10 mg total) by mouth 4 (four) times daily -  before meals and at bedtime. 05/24/16  Yes Meredith Staggers, MD  doxycycline (VIBRAMYCIN) 100 MG capsule Take 1 capsule (100 mg total) by mouth 2 (two) times daily. 06/07/16  Yes [name], MD  escitalopram (LEXAPRO) 20 MG tablet Take 1 tablet (20 mg total) by mouth at bedtime. 12/07/15  Yes [name], MD  fexofenadine (ALLEGRA) 180 MG tablet Take 1 tablet (180 mg total) by mouth daily as needed for allergies. 12/07/15  Yes [name], MD  gabapentin (NEURONTIN) 100 MG capsule TAKE 1 CAPSULE (100 MG TOTAL) BY MOUTH 2 (TWO) TIMES DAILY. 04/06/16  Yes Meredith Staggers, MD  gabapentin (NEURONTIN) 300 MG capsule TAKE 1 CAPSULE (300 MG TOTAL) BY MOUTH 3 (THREE) TIMES DAILY. 04/06/16  Yes Meredith Staggers, MD  hydroxypropyl methylcellulose / hypromellose (ISOPTO TEARS / GONIOVISC) 2.5 % ophthalmic solution Place 1 drop into both eyes as needed for dry eyes.   Yes Historical Provider, MD  lidocaine (LIDODERM) 5 % Place 1 patch onto the skin as needed. Remove & Discard patch within 12 hours or as directed by MD    Yes Historical Provider, MD  meclizine (ANTIVERT) 25 MG tablet TAKE 1 TABLET (25 MG TOTAL) BY MOUTH 2 (TWO) TIMES DAILY AS NEEDED. 12/07/15  Yes [name], MD  methocarbamol (ROBAXIN) 500 MG tablet TAKE 1 TABLET EVERY 6 HOURS AS NEEDED FOR MUSCLE SPASMS LAST FOR ONE MONTH 04/15/16  Yes Meredith Staggers, MD  mupirocin ointment (BACTROBAN) 2 % Place 1 application into the nose 2 (two) times daily. 06/07/16  Yes [name], MD  naproxen (NAPROSYN) 500 MG tablet TAKE 1 TABLET (500 MG TOTAL) BY MOUTH 2 (TWO) TIMES DAILY WITH A MEAL. 02/29/16  Yes Meredith Staggers, MD  omeprazole (PRILOSEC) 20 MG capsule TAKE 1 CAPSULE (20 MG TOTAL) BY MOUTH DAILY. Patient taking differently: Take 20 mg by mouth daily as needed (Acid reflux).  12/07/15  Yes [name], MD  oxyCODONE-acetaminophen (PERCOCET) 5-325 MG tablet Take 1 tablet by mouth every 8 (eight) hours as needed. 04/25/16  Yes Bayard Hugger, NP  zolpidem (AMBIEN) 5 MG tablet Take 1 tablet (5 mg total) by mouth at bedtime as needed. 12/07/15  Yes [name], MD    Family History Family History  Problem Relation Age of Onset  . Coronary artery disease Mother   . Diabetes Mother     borderline  . Kidney disease Mother     stage 4  . Coronary artery disease Father   . Prostate cancer Father   . Lung disease Father     black lung  . Liver cancer Maternal Grandmother   . Liver disease Maternal Grandmother   . Polymyalgia rheumatica Sister   . Heart disease Sister   . Coronary artery disease  Brother     heart attack  . COPD Brother   . Colon polyps Sister   . Lung cancer Paternal Grandmother   . Colon cancer      Both sides    Social History Social History  Substance Use Topics  . Smoking status: Former Smoker    Packs/day: 1.00    Years: 42.00    Types: Cigarettes    Quit date: 06/01/2015  . Smokeless tobacco: Never Used  Comment: would like to try chantix, but has not started on it due to warnings related to depression  . Alcohol use No     Allergies   Chantix [varenicline]; Codeine; Cymbalta [duloxetine hcl]; Livalo [pitavastatin]; Metoprolol; Oxycodone-aspirin; Oxycontin [oxycodone hcl]; Propoxyphene n-acetaminophen; Trazodone and nefazodone; Zithromax [azithromycin]; Other; and Oxycodone-acetaminophen   Review of Systems Review of Systems  All other systems reviewed and are negative.    Physical Exam Updated Vital Signs BP 131/71 (BP Location: Right Arm)   Pulse 67   Temp 98.5 F (36.9 C) (Oral)   Resp 16   Ht 5\' 6"  (1.676 m)   Wt 152 lb (68.9 kg)   SpO2 97%   BMI 24.53 kg/m   Physical Exam  Constitutional: She is oriented to person, place, and time. She appears well-developed and well-nourished.  HENT:  Head: Normocephalic and atraumatic.  Eyes: Conjunctivae and EOM are normal. Pupils are equal, round, and reactive to light.  Neck: Normal range of motion and phonation normal. Neck supple.  Cardiovascular: Normal rate.   Pulmonary/Chest: Effort normal.  Musculoskeletal: Normal range of motion.  Neurological: She is alert and oriented to person, place, and time. She exhibits normal muscle tone.  Skin: Skin is warm and dry.  Left axilla, with 2.5 x 4 cm indurated, tender area, with fluctuance. Central pustule, without drainage. Mild associated erythema spreading toward the left breast about 4 cm. Right axilla area, 1 cm, firm, red, area consistent with early infection.  Psychiatric: She has a normal mood and affect. Her behavior is normal.  Judgment and thought content normal.  Nursing note and vitals reviewed.    ED Treatments / Results  Labs (all labs ordered are listed, but only abnormal results are displayed) Labs Reviewed - No data to display  EKG  EKG Interpretation None       Radiology No results found.  Procedures .Marland KitchenIncision and Drainage Date/Time: 06/09/2016 9:07 PM Performed by: Daleen Bo Authorized by: Daleen Bo     INCISION AND DRAINAGE Performed by: Richarda Blade Consent: Verbal consent obtained. Risks and benefits: risks, benefits and alternatives were discussed Type: abscess  Body area: Left axilla  Anesthesia: local infiltration  Incision was made with a scalpel.  Local anesthetic: lidocaine 2% % without epinephrine  Anesthetic total: 3 ml  Complexity: complex Blunt dissection to break up loculations  Drainage: purulent  Drainage amount: Moderate     Patient tolerance: Patient tolerated the procedure well with no immediate complications.       (including critical care time)  Medications Ordered in ED Medications  lidocaine (XYLOCAINE) 2 % injection 10 mL (not administered)     Initial Impression / Assessment and Plan / ED Course  I have reviewed the triage vital signs and the nursing notes.  Pertinent labs & imaging results that were available during my care of the patient were reviewed by me and considered in my medical decision making (see chart for details).  Clinical Course    Medications  lidocaine (XYLOCAINE) 2 % injection 10 mL (not administered)    Patient Vitals for the past 24 hrs:  BP Temp Temp src Pulse Resp SpO2 Height Weight  06/09/16 1936 131/71 98.5 F (36.9 C) Oral 67 16 97 % 5\' 6"  (1.676 m) 152 lb (68.9 kg)    9:06 PM Reevaluation with update and discussion. After initial assessment and treatment, an updated evaluation reveals , At this time, she is comfortable and has no further questions. Findings discussed with the  patient,  her sister, all questions answered. Relena Ivancic L    Final Clinical Impressions(s) / ED Diagnoses   Final diagnoses:  Abscess    Left axilla abscess, with mild associated cellulitis. Doubt sepsis or impending vascular collapse.  Nursing Notes Reviewed/ Care Coordinated Applicable Imaging Reviewed Interpretation of Laboratory Data incorporated into ED treatment  The patient appears reasonably screened and/or stabilized for discharge and I doubt any other medical condition or other [facility] requiring further screening, evaluation, or treatment in the ED at this time prior to discharge.  Plan: Home Medications- continue doxycycline, Bactroban; Home Treatments- aggressive warm compress, and cleansing with soap or Hibiclens; return here if the recommended treatment, does not improve the symptoms; Recommended follow up- PCP as scheduled, and as needed.    New Prescriptions New Prescriptions   No medications on file     Daleen Bo, MD 06/09/16 2111

## 2016-06-09 NOTE — ED Notes (Signed)
Placed dressing on incision.

## 2016-06-16 ENCOUNTER — Other Ambulatory Visit: Payer: Self-pay | Admitting: Family Medicine

## 2016-06-21 ENCOUNTER — Encounter: Payer: Medicare Other | Attending: Physical Medicine & Rehabilitation | Admitting: Registered Nurse

## 2016-06-21 ENCOUNTER — Encounter: Payer: Self-pay | Admitting: Registered Nurse

## 2016-06-21 VITALS — BP 103/69 | HR 72

## 2016-06-21 DIAGNOSIS — M542 Cervicalgia: Secondary | ICD-10-CM | POA: Insufficient documentation

## 2016-06-21 DIAGNOSIS — G8929 Other chronic pain: Secondary | ICD-10-CM | POA: Insufficient documentation

## 2016-06-21 DIAGNOSIS — Z79899 Other long term (current) drug therapy: Secondary | ICD-10-CM | POA: Insufficient documentation

## 2016-06-21 DIAGNOSIS — F411 Generalized anxiety disorder: Secondary | ICD-10-CM | POA: Diagnosis present

## 2016-06-21 DIAGNOSIS — G894 Chronic pain syndrome: Secondary | ICD-10-CM | POA: Diagnosis not present

## 2016-06-21 DIAGNOSIS — M7501 Adhesive capsulitis of right shoulder: Secondary | ICD-10-CM | POA: Insufficient documentation

## 2016-06-21 DIAGNOSIS — M7502 Adhesive capsulitis of left shoulder: Secondary | ICD-10-CM | POA: Insufficient documentation

## 2016-06-21 DIAGNOSIS — Z5181 Encounter for therapeutic drug level monitoring: Secondary | ICD-10-CM | POA: Insufficient documentation

## 2016-06-21 DIAGNOSIS — M791 Myalgia: Secondary | ICD-10-CM | POA: Diagnosis not present

## 2016-06-21 DIAGNOSIS — M7631 Iliotibial band syndrome, right leg: Secondary | ICD-10-CM | POA: Insufficient documentation

## 2016-06-21 DIAGNOSIS — M7918 Myalgia, other site: Secondary | ICD-10-CM

## 2016-06-21 DIAGNOSIS — M25512 Pain in left shoulder: Secondary | ICD-10-CM | POA: Insufficient documentation

## 2016-06-21 DIAGNOSIS — M47812 Spondylosis without myelopathy or radiculopathy, cervical region: Secondary | ICD-10-CM

## 2016-06-21 MED ORDER — OXYCODONE-ACETAMINOPHEN 5-325 MG PO TABS: 1.0000 | ORAL_TABLET | Freq: Three times a day (TID) | ORAL | 0 refills | Status: DC | PRN

## 2016-06-21 NOTE — Progress Notes (Signed)
Subjective:    Patient ID: Cassandra [name], female    DOB: 08-30-1956, 60 y.o.   MRN: [id number]  HPI: [name] is a 60 year old female who returns for follow up for chronic pain and medication refill. She states her pain is located in her neck, bilateralshoulders, left shoulder pain radiating into her left arm. She did not rate her pain today. Her current exercise regime is walking.   Pain Inventory Average Pain 7 Pain Right Now n/a My pain is constant, sharp, burning, dull, stabbing, tingling and aching  In the last 24 hours, has pain interfered with the following? General activity 8 Relation with others 8 Enjoyment of life 8 What TIME of day is your pain at its worst? morning, night Sleep (in general) Poor  Pain is worse with: bending, sitting and some activites Pain improves with: heat/ice, pacing activities and medication Relief from Meds: 6  Mobility walk without assistance how many minutes can you walk? n/a ability to climb steps?  yes do you drive?  yes needs help with transfers  Function disabled: date disabled 2007 I need assistance with the following:  dressing and household duties  Neuro/Psych bladder control problems bowel control problems weakness numbness tremor tingling spasms dizziness confusion depression anxiety  Prior Studies Any changes since last visit?  no  Physicians involved in your care Any changes since last visit?  no Primary care Terrance Mass, Tammy Eckard, Kristen [employer], Chrisy [place]   Family History  Problem Relation Age of Onset  . Coronary artery disease Mother   . Diabetes Mother     borderline  . Kidney disease Mother     stage 4  . Coronary artery disease Father   . Prostate cancer Father   . Lung disease Father     black lung  . Liver cancer Maternal Grandmother   . Liver disease Maternal Grandmother   . Polymyalgia rheumatica Sister   . Heart disease Sister   . Coronary artery disease Brother    heart attack  . COPD Brother   . Colon polyps Sister   . Lung cancer Paternal Grandmother   . Colon cancer      Both sides   Social History   Social History  . Marital status: Married    Spouse name: N/A  . Number of children: 1  . Years of education: N/A   Occupational History  . Homemaker    Social History Main Topics  . Smoking status: Former Smoker    Packs/day: 1.00    Years: 42.00    Types: Cigarettes    Quit date: 06/01/2015  . Smokeless tobacco: Never Used     Comment: would like to try chantix, but has not started on it due to warnings related to depression  . Alcohol use No  . Drug use: No  . Sexual activity: Yes   Other Topics Concern  . Not on file   Social History Narrative  . No narrative on file   Past Surgical History:  Procedure Laterality Date  . ABDOMINAL HYSTERECTOMY     age 60  . ANAL RECTAL MANOMETRY N/A 12/09/2015   Procedure: ANO RECTAL MANOMETRY;  Surgeon: Mauri Pole, MD;  Location: [facility];  Service: Endoscopy;  Laterality: N/A;  . BLADDER REPAIR     sling/with perforation  . CESAREAN SECTION     1 time  . KNEE ARTHROSCOPY Left   . ruptured uterus    . SHOULDER ARTHROSCOPY  multiple   Past Medical History:  Diagnosis Date  . Anxiety   . Bowel habit changes    excessive gas /frequent BM's with change in shape with pus  . Chronic left shoulder pain    frozen   . Chronic pain    left upper shoulder ,neck and back  . COPD (chronic obstructive pulmonary disease) ([place])    Dr. Redge Gainer  . Depression   . GERD (gastroesophageal reflux disease)   . Hyperlipidemia   . Maternal complication related to childbirth    Sepsis, staph  . Meniere's disease   . Nicotine addiction    2 packs per day   . Rapid or irregular heartbeat   . Skin cancer    Rt. leg, squamous cell  . Tubular adenoma of colon 01/31/2000   Dr. Richmond Campbell  . Weakness of pelvic floor    prolapse -  MRI from 2017   There were no vitals taken  for this visit.  Opioid Risk Score:   Fall Risk Score:  `1  Depression screen PHQ 2/9  Depression screen [facility] 2/9 06/07/2016 04/27/2016 03/24/2016 03/14/2016 03/10/2016 12/28/2015 12/07/2015  Decreased Interest 0 1 0 0 0 2 0  Down, Depressed, Hopeless 0 1 0 0 0 1 0  PHQ - 2 Score 0 2 0 0 0 3 0  Altered sleeping - 2 - - - - -  Tired, decreased energy - 1 - - - - -  Change in appetite - 1 - - - - -  Feeling bad or failure about yourself  - 0 - - - - -  Trouble concentrating - 1 - - - - -  Moving slowly or fidgety/restless - 1 - - - - -  Suicidal thoughts - 0 - - - - -  PHQ-9 Score - 8 - - - - -  Difficult doing work/chores - Somewhat difficult - - - - -    Review of Systems  Constitutional: Positive for appetite change, diaphoresis and unexpected weight change.  Gastrointestinal: Positive for constipation, diarrhea, nausea and vomiting.       Bowel problems  Genitourinary: Positive for difficulty urinating and dysuria.  Neurological: Positive for dizziness, tremors, weakness and numbness.       Tingling Spasms  Psychiatric/Behavioral: Positive for confusion. The patient is nervous/anxious.        Depression  All other systems reviewed and are negative.      Objective:   Physical Exam  Constitutional: She is oriented to person, place, and time. She appears well-developed and well-nourished.  HENT:  Head: Normocephalic and atraumatic.  Neck: Normal range of motion. Neck supple.  Cardiovascular: Normal rate and regular rhythm.   Pulmonary/Chest: Effort normal and breath sounds normal.  Musculoskeletal:  Normal Muscle Bulk and Muscle Testing Reveals: Upper Extremities: Right: Full ROM and  Muscle Strength 5/5 Left AC Joint Tenderness Thoracic Paraspinal Tenderness: T-1-T-7 Lower Extremities Full ROM and Muscle Strength 5/5 Arises from table with ease Narrow Based Gait  Neurological: She is alert and oriented to person, place, and time.  Skin: Skin is warm and dry.  Psychiatric:  She has a normal mood and affect.  Nursing note and vitals reviewed.         Assessment & Plan:  1. Post-traumatic arthritis left shoulder s/p 2 shoulder surgeries. Now with adhesive capsulitis left shoulder. :  Refilled:oxyCODONE 5/325mg  one tablet every 8 hours as needed. #70. Second script given for the following month. 2. Cervicalgia, spondylosis: Continue  Current Medication Regime.Lidoderm patches, Robaxin, Naprosyn, and Gabapentin 3. Depression/insomnia/anxiety: Continue Lexapro. Continue Ambien 5 mg HS #30. Dr. Livia Snellen prescribing Xanax.    20 minutes of face to face patient care time was spent during this visit. All questions were encouraged and answered  F/U in 2 months

## 2016-06-27 ENCOUNTER — Ambulatory Visit (INDEPENDENT_AMBULATORY_CARE_PROVIDER_SITE_OTHER): Payer: Medicare Other | Admitting: Family Medicine

## 2016-06-27 ENCOUNTER — Encounter: Payer: Self-pay | Admitting: Family Medicine

## 2016-06-27 VITALS — BP 152/99 | HR 88 | Temp 97.3°F | Ht 66.0 in | Wt 152.0 lb

## 2016-06-27 DIAGNOSIS — G4701 Insomnia due to medical condition: Secondary | ICD-10-CM | POA: Diagnosis not present

## 2016-06-27 DIAGNOSIS — M791 Myalgia: Secondary | ICD-10-CM

## 2016-06-27 DIAGNOSIS — K582 Mixed irritable bowel syndrome: Secondary | ICD-10-CM

## 2016-06-27 DIAGNOSIS — F411 Generalized anxiety disorder: Secondary | ICD-10-CM

## 2016-06-27 DIAGNOSIS — M7918 Myalgia, other site: Secondary | ICD-10-CM

## 2016-06-27 DIAGNOSIS — Z23 Encounter for immunization: Secondary | ICD-10-CM

## 2016-06-27 MED ORDER — LUBIPROSTONE 8 MCG PO CAPS: 8.0000 ug | ORAL_CAPSULE | Freq: Two times a day (BID) | ORAL | 5 refills | Status: DC

## 2016-06-27 MED ORDER — ALPRAZOLAM 1 MG PO TABS: ORAL_TABLET | ORAL | 5 refills | Status: DC

## 2016-06-27 MED ORDER — ZOLPIDEM TARTRATE 5 MG PO TABS: 5.0000 mg | ORAL_TABLET | Freq: Every evening | ORAL | 5 refills | Status: DC | PRN

## 2016-06-27 NOTE — Progress Notes (Signed)
Subjective:  Patient ID: Cassandra [name], female    DOB: March 26, 1956  Age: 60 y.o. MRN: [id number]  CC: Medication Refill   HPI Cassandra [name] presents for Recurrent IBS symptoms. Intermittent constipation and diarrhea alternating randomly. Also cramping. She has times when she feels weak and sweaty like her blood sugars dropping. She also is having trouble sleeping now because she ran out of her Ambien. She has some opiate-induced constipation. She sees pain clinic and gets her opiate there as well as gabapentin. She has some history of anxiety and almost certainly has exacerbation of that caused by the use of opiates. Unfortunately, she also says that she hurts all over. This is apparently in spite of the use of the opiate.  Depression screen [facility] 2/9 06/27/2016 06/07/2016 04/27/2016 03/24/2016 03/14/2016  Decreased Interest 0 0 1 0 0  Down, Depressed, Hopeless 0 0 1 0 0  PHQ - 2 Score 0 0 2 0 0  Altered sleeping - - 2 - -  Tired, decreased energy - - 1 - -  Change in appetite - - 1 - -  Feeling bad or failure about yourself  - - 0 - -  Trouble concentrating - - 1 - -  Moving slowly or fidgety/restless - - 1 - -  Suicidal thoughts - - 0 - -  PHQ-9 Score - - 8 - -  Difficult doing work/chores - - Somewhat difficult - -      History Cassandra [name] has a past medical history of Anxiety; Bowel habit changes; Chronic left shoulder pain; Chronic pain; COPD (chronic obstructive pulmonary disease) ([place]); Depression; GERD (gastroesophageal reflux disease); Hyperlipidemia; Maternal complication related to childbirth; Meniere's disease; Nicotine addiction; Rapid or irregular heartbeat; Skin cancer; Tubular adenoma of colon (01/31/2000); and Weakness of pelvic floor.   She has a past surgical history that includes Bladder repair; Shoulder arthroscopy; Abdominal hysterectomy; Knee arthroscopy (Left); Cesarean section; ruptured uterus; and Anal Rectal manometry (N/A, 12/09/2015).   Her family history includes COPD  in her brother; Colon polyps in her sister; Coronary artery disease in her brother, father, and mother; Diabetes in her mother; Heart disease in her sister; Kidney disease in her mother; Liver cancer in her maternal grandmother; Liver disease in her maternal grandmother; Lung cancer in her paternal grandmother; Lung disease in her father; Polymyalgia rheumatica in her sister; Prostate cancer in her father.She reports that she quit smoking about 12 months ago. Her smoking use included Cigarettes. She has a 42.00 pack-year smoking history. She has never used smokeless tobacco. She reports that she does not drink alcohol or use drugs.    ROS Review of Systems  Constitutional: Negative for activity change, appetite change and fever.  HENT: Negative for congestion, rhinorrhea and sore throat.   Eyes: Negative for visual disturbance.  Respiratory: Negative for cough and shortness of breath.   Cardiovascular: Negative for chest pain and palpitations.  Gastrointestinal: Positive for abdominal pain, constipation and diarrhea. Negative for nausea.  Genitourinary: Negative for dysuria.  Musculoskeletal: Negative for arthralgias and myalgias.  Psychiatric/Behavioral: Positive for dysphoric mood. The patient is nervous/anxious.     Objective:  BP (!) 152/99 (BP Location: Left Arm)   Pulse 88   Temp 97.3 F (36.3 C) (Oral)   Ht 5\' 6"  (1.676 m)   Wt 152 lb (68.9 kg)   BMI 24.53 kg/m   BP Readings from Last 3 Encounters:  06/27/16 (!) 152/99  06/21/16 103/69  06/09/16 131/71    Wt Readings from  Last 3 Encounters:  06/27/16 152 lb (68.9 kg)  06/09/16 152 lb (68.9 kg)  06/07/16 152 lb 6 oz (69.1 kg)     Physical Exam  Constitutional: She is oriented to person, place, and time. She appears well-developed and well-nourished. No distress.  HENT:  Head: Normocephalic and atraumatic.  Right Ear: External ear normal.  Left Ear: External ear normal.  Nose: Nose normal.  Mouth/Throat: Oropharynx  is clear and moist.  Eyes: Conjunctivae and EOM are normal. Pupils are equal, round, and reactive to light.  Neck: Normal range of motion. Neck supple. No thyromegaly present.  Cardiovascular: Normal rate, regular rhythm and normal heart sounds.   No murmur heard. Pulmonary/Chest: Effort normal and breath sounds normal. No respiratory distress. She has no wheezes. She has no rales.  Abdominal: Soft. Bowel sounds are normal. She exhibits no distension. There is no tenderness.  Lymphadenopathy:    She has no cervical adenopathy.  Neurological: She is alert and oriented to person, place, and time. She has normal reflexes.  Skin: Skin is warm and dry.  Psychiatric: She has a normal mood and affect. Her behavior is normal. Judgment and thought content normal.     Lab Results  Component Value Date   WBC 6.9 03/24/2016   HGB 14.7 12/10/2013   HCT 42.0 03/24/2016   PLT 312 03/24/2016   GLUCOSE 98 03/24/2016   CHOL 254 (H) 03/24/2016   TRIG 86 03/24/2016   HDL 64 03/24/2016   LDLCALC 173 (H) 03/24/2016   ALT 10 03/24/2016   AST 19 03/24/2016   NA 141 03/24/2016   K 4.4 03/24/2016   CL 98 03/24/2016   CREATININE 1.10 (H) 03/24/2016   BUN 21 03/24/2016   CO2 25 03/24/2016   TSH 0.602 03/24/2016    No results found.  Assessment & Plan:   Cassandra [name] was seen today for medication refill.  Diagnoses and all orders for this visit:  Irritable bowel syndrome with both constipation and diarrhea  Encounter for immunization -     Flu Vaccine QUAD 36+ mos IM  Myofascial pain  GAD (generalized anxiety disorder)  Insomnia due to medical condition  Other orders -     ALPRAZolam (XANAX) 1 MG tablet; Take one tablet twice daily for Meniere's Disease -     zolpidem (AMBIEN) 5 MG tablet; Take 1 tablet (5 mg total) by mouth at bedtime as needed. For sleep -     lubiprostone (AMITIZA) 8 MCG capsule; Take 1 capsule (8 mcg total) by mouth 2 (two) times daily with a meal. For irritable  bowels    I have discontinued Cassandra [name]'s dicyclomine and doxycycline. I have also changed her zolpidem. Additionally, I am having her start on lubiprostone. Lastly, I am having her maintain her lidocaine, conjugated estrogens, escitalopram, fexofenadine, meclizine, omeprazole, hydroxypropyl methylcellulose / hypromellose, gabapentin, gabapentin, methocarbamol, chlorhexidine, mupirocin ointment, naproxen, oxyCODONE-acetaminophen, and ALPRAZolam.  Meds ordered this encounter  Medications  . ALPRAZolam (XANAX) 1 MG tablet    Sig: Take one tablet twice daily for Meniere's Disease    Dispense:  60 tablet    Refill:  5  . zolpidem (AMBIEN) 5 MG tablet    Sig: Take 1 tablet (5 mg total) by mouth at bedtime as needed. For sleep    Dispense:  30 tablet    Refill:  5    Not to exceed 4 additional fills before 02/06/2016  . lubiprostone (AMITIZA) 8 MCG capsule    Sig: Take 1 capsule (8  mcg total) by mouth 2 (two) times daily with a meal. For irritable bowels    Dispense:  60 capsule    Refill:  5     Follow-up: Return in about 6 months (around 12/26/2016).  [name], M.D.

## 2016-06-28 LAB — TOXASSURE SELECT,+ANTIDEPR,UR

## 2016-06-28 NOTE — Progress Notes (Signed)
Urine drug screen for this encounter is consistent for prescribed medication 

## 2016-07-11 ENCOUNTER — Other Ambulatory Visit: Payer: Self-pay | Admitting: Family Medicine

## 2016-07-11 DIAGNOSIS — B379 Candidiasis, unspecified: Secondary | ICD-10-CM

## 2016-07-11 MED ORDER — FLUCONAZOLE 150 MG PO TABS: 150.0000 mg | ORAL_TABLET | Freq: Once | ORAL | 0 refills | Status: DC

## 2016-07-11 NOTE — Telephone Encounter (Signed)
Sent in diflucan x 1, should help with symptoms. If not, needs to be seen.

## 2016-07-11 NOTE — Telephone Encounter (Signed)
Patient has symptoms of a yeast infection - vaginal itching and odor.  She is using OTC Monistat, symptoms seem to be slightly better but have not resolved.  She would like to have Diflucan sent in if possible.  Please advise.

## 2016-07-11 NOTE — Telephone Encounter (Signed)
Patient aware.

## 2016-07-15 ENCOUNTER — Telehealth: Payer: Self-pay | Admitting: Family Medicine

## 2016-07-15 ENCOUNTER — Encounter: Payer: Self-pay | Admitting: Family Medicine

## 2016-07-15 ENCOUNTER — Ambulatory Visit (INDEPENDENT_AMBULATORY_CARE_PROVIDER_SITE_OTHER): Payer: Medicare Other | Admitting: Family Medicine

## 2016-07-15 VITALS — BP 109/68 | HR 56 | Temp 98.1°F | Ht 66.0 in | Wt 151.5 lb

## 2016-07-15 DIAGNOSIS — B379 Candidiasis, unspecified: Secondary | ICD-10-CM

## 2016-07-15 DIAGNOSIS — L732 Hidradenitis suppurativa: Secondary | ICD-10-CM

## 2016-07-15 MED ORDER — FLUCONAZOLE 150 MG PO TABS: 150.0000 mg | ORAL_TABLET | Freq: Once | ORAL | 5 refills | Status: DC

## 2016-07-15 MED ORDER — CIPROFLOXACIN HCL 500 MG PO TABS: 500.0000 mg | ORAL_TABLET | Freq: Two times a day (BID) | ORAL | 0 refills | Status: AC

## 2016-07-15 NOTE — Progress Notes (Signed)
Subjective:  Patient ID: Cassandra [name], female    DOB: [birth year]  Age: 60 y.o. MRN: [id number]  CC: Recurrent Skin Infections (pt here today c/o abscess in left axillary area and she has already had 2 rounds of antibiotics and using heat. )   HPI Cassandra [name] presents for Symptoms noted above. Somewhat painful. Primarily on the left today. Previous antibiotics have been successful in the past.   History Cassandra [name] has a past medical history of Anxiety; Bowel habit changes; Chronic left shoulder pain; Chronic pain; COPD (chronic obstructive pulmonary disease) ([place]andra [name]); Depression; GERD (gastroesophageal reflux disease); Hyperlipidemia; Maternal complication related to childbirth; Meniere's disease; Nicotine addiction; Rapid or irregular heartbeat; Skin cancer; Tubular adenoma of colon (01/31/2000); and Weakness of pelvic floor.   She has a past surgical history that includes Bladder repair; Shoulder arthroscopy; Abdominal hysterectomy; Knee arthroscopy (Left); Cesarean section; ruptured uterus; and Anal Rectal manometry (N/A, 12/09/2015).   Her family history includes COPD in her brother; Colon polyps in her sister; Coronary artery disease in her brother, father, and mother; Diabetes in her mother; Heart disease in her sister; Kidney disease in her mother; Liver cancer in her maternal grandmother; Liver disease in her maternal grandmother; Lung cancer in her paternal grandmother; Lung disease in her father; Polymyalgia rheumatica in her sister; Prostate cancer in her father.She reports that she quit smoking about 14 months ago. Her smoking use included Cigarettes. She has a 42.00 pack-year smoking history. She has never used smokeless tobacco. She reports that she does not drink alcohol or use drugs.    ROS Review of Systems  Constitutional: Negative for activity change, appetite change and fever.  HENT: Negative for congestion, rhinorrhea and sore throat.   Eyes: Negative for visual disturbance.    Respiratory: Negative for cough and shortness of breath.   Cardiovascular: Negative for chest pain and palpitations.  Gastrointestinal: Negative for abdominal pain, diarrhea and nausea.  Genitourinary: Negative for dysuria.  Musculoskeletal: Negative for arthralgias and myalgias.  Skin: Positive for rash and wound.    Objective:  BP 109/68   Pulse (!) 56   Temp 98.1 F (36.7 C) (Oral)   Ht 5\' 6"  (1.676 m)   Wt 151 lb 8 oz (68.7 kg)   BMI 24.45 kg/m   BP Readings from Last 3 Encounters:  07/15/16 109/68  06/27/16 (!) 152/99  06/21/16 103/69    Wt Readings from Last 3 Encounters:  07/15/16 151 lb 8 oz (68.7 kg)  06/27/16 152 lb (68.9 kg)  06/09/16 152 lb (68.9 kg)     Physical Exam  Constitutional: She is oriented to person, place, and time. She appears well-developed and well-nourished. No distress.  Cardiovascular: Normal rate and regular rhythm.   Pulmonary/Chest: Breath sounds normal.  Neurological: She is alert and oriented to person, place, and time.  Skin: Skin is warm and dry.  There are 2 areas of erythema with mild induration but no fluctuance noted at the left axilla. One measures 4 mm the other is 7 mm. Both are tender.  Psychiatric: She has a normal mood and affect.     Lab Results  Component Value Date   WBC 6.9 03/24/2016   HGB 14.7 12/10/2013   HCT 42.0 03/24/2016   PLT 312 03/24/2016   GLUCOSE 98 03/24/2016   CHOL 254 (H) 03/24/2016   TRIG 86 03/24/2016   HDL 64 03/24/2016   LDLCALC 173 (H) 03/24/2016   ALT 10 03/24/2016   AST 19 03/24/2016  NA 141 03/24/2016   K 4.4 03/24/2016   CL 98 03/24/2016   CREATININE 1.10 (H) 03/24/2016   BUN 21 03/24/2016   CO2 25 03/24/2016   TSH 0.602 03/24/2016    No results found.  Assessment & Plan:   Cassandra [name] was seen today for recurrent skin infections.  Diagnoses and all orders for this visit:  Hidradenitis suppurativa of left axilla  Yeast infection -     fluconazole (DIFLUCAN) 150 MG  tablet; Take 1 tablet (150 mg total) by mouth once.  Hidradenitis suppurativa of right axilla -     Ambulatory referral to Dermatology  Other orders -     ciprofloxacin (CIPRO) 500 MG tablet; Take 1 tablet (500 mg total) by mouth 2 (two) times daily.    I am having Cassandra [name] start on ciprofloxacin. I am also having her maintain her lidocaine, conjugated estrogens, escitalopram, fexofenadine, meclizine, hydroxypropyl methylcellulose / hypromellose, gabapentin, gabapentin, methocarbamol, chlorhexidine, mupirocin ointment, oxyCODONE-acetaminophen, ALPRAZolam, zolpidem, and lubiprostone.  Meds ordered this encounter  Medications  . fluconazole (DIFLUCAN) 150 MG tablet    Sig: Take 1 tablet (150 mg total) by mouth once.    Dispense:  1 tablet    Refill:  5  . ciprofloxacin (CIPRO) 500 MG tablet    Sig: Take 1 tablet (500 mg total) by mouth 2 (two) times daily.    Dispense:  20 tablet    Refill:  0     Follow-up: Return in about 3 months (around 10/15/2016).  Cassandra [name], M.D.

## 2016-07-15 NOTE — Telephone Encounter (Signed)
Patient aware that she needs to be seen.

## 2016-07-19 ENCOUNTER — Other Ambulatory Visit: Payer: Self-pay | Admitting: *Deleted

## 2016-07-19 MED ORDER — NAPROXEN 500 MG PO TABS: ORAL_TABLET | ORAL | 2 refills | Status: DC

## 2016-07-19 MED ORDER — OMEPRAZOLE 20 MG PO CPDR: DELAYED_RELEASE_CAPSULE | ORAL | 4 refills | Status: DC

## 2016-07-25 ENCOUNTER — Other Ambulatory Visit: Payer: Self-pay | Admitting: *Deleted

## 2016-07-25 MED ORDER — OMEPRAZOLE 20 MG PO CPDR: DELAYED_RELEASE_CAPSULE | ORAL | 4 refills | Status: DC

## 2016-07-25 MED ORDER — NAPROXEN 500 MG PO TABS: ORAL_TABLET | ORAL | 4 refills | Status: DC

## 2016-07-28 ENCOUNTER — Other Ambulatory Visit: Payer: Self-pay | Admitting: Family Medicine

## 2016-08-02 ENCOUNTER — Other Ambulatory Visit: Payer: Self-pay | Admitting: Physical Medicine & Rehabilitation

## 2016-08-17 ENCOUNTER — Encounter: Payer: Self-pay | Admitting: Registered Nurse

## 2016-08-17 ENCOUNTER — Encounter: Payer: Medicare Other | Attending: Physical Medicine & Rehabilitation | Admitting: Registered Nurse

## 2016-08-17 VITALS — BP 122/72 | HR 75

## 2016-08-17 DIAGNOSIS — M791 Myalgia: Secondary | ICD-10-CM | POA: Diagnosis not present

## 2016-08-17 DIAGNOSIS — G8929 Other chronic pain: Secondary | ICD-10-CM | POA: Insufficient documentation

## 2016-08-17 DIAGNOSIS — M542 Cervicalgia: Secondary | ICD-10-CM | POA: Diagnosis not present

## 2016-08-17 DIAGNOSIS — M7501 Adhesive capsulitis of right shoulder: Secondary | ICD-10-CM | POA: Insufficient documentation

## 2016-08-17 DIAGNOSIS — M47812 Spondylosis without myelopathy or radiculopathy, cervical region: Secondary | ICD-10-CM

## 2016-08-17 DIAGNOSIS — G894 Chronic pain syndrome: Secondary | ICD-10-CM

## 2016-08-17 DIAGNOSIS — Z5181 Encounter for therapeutic drug level monitoring: Secondary | ICD-10-CM | POA: Diagnosis not present

## 2016-08-17 DIAGNOSIS — M7631 Iliotibial band syndrome, right leg: Secondary | ICD-10-CM | POA: Diagnosis not present

## 2016-08-17 DIAGNOSIS — M7502 Adhesive capsulitis of left shoulder: Secondary | ICD-10-CM | POA: Diagnosis not present

## 2016-08-17 DIAGNOSIS — F411 Generalized anxiety disorder: Secondary | ICD-10-CM | POA: Diagnosis present

## 2016-08-17 DIAGNOSIS — M25512 Pain in left shoulder: Secondary | ICD-10-CM | POA: Insufficient documentation

## 2016-08-17 DIAGNOSIS — M7918 Myalgia, other site: Secondary | ICD-10-CM

## 2016-08-17 DIAGNOSIS — Z79899 Other long term (current) drug therapy: Secondary | ICD-10-CM | POA: Diagnosis not present

## 2016-08-17 MED ORDER — OXYCODONE-ACETAMINOPHEN 5-325 MG PO TABS: 1.0000 | ORAL_TABLET | Freq: Three times a day (TID) | ORAL | 0 refills | Status: DC | PRN

## 2016-08-17 MED ORDER — METHOCARBAMOL 500 MG PO TABS: 500.0000 mg | ORAL_TABLET | Freq: Two times a day (BID) | ORAL | 2 refills | Status: DC | PRN

## 2016-08-17 NOTE — Progress Notes (Signed)
Subjective:    Patient ID: Cassandra [name], female    DOB: April 20, 1956, 60 y.o.   MRN: [id number]  HPI: [name] is a 60year old female who returns for follow up for chronic pain and medication refill. She statesher pain is located in her neck mainly left side, left shoulder, left arm and upper back mainly left side. She ratesher pain 7. Her current exercise regime is walking.   Pain Inventory Average Pain 7 Pain Right Now 7 My pain is constant, burning, dull, stabbing, tingling and aching  In the last 24 hours, has pain interfered with the following? General activity 8 Relation with others 6 Enjoyment of life 8 What TIME of day is your pain at its worst? all Sleep (in general) Poor  Pain is worse with: bending, sitting, inactivity, standing and some activites Pain improves with: heat/ice, pacing activities, medication and TENS Relief from Meds: 5  Mobility walk without assistance use a cane ability to climb steps?  yes do you drive?  yes needs help with transfers  Function disabled: date disabled 2007 I need assistance with the following:  household duties and shopping  Neuro/Psych bladder control problems bowel control problems weakness numbness tremor tingling spasms dizziness confusion anxiety  Prior Studies Any changes since last visit?  no  Physicians involved in your care Any changes since last visit?  no   Family History  Problem Relation Age of Onset  . Coronary artery disease Mother   . Diabetes Mother     borderline  . Kidney disease Mother     stage 4  . Coronary artery disease Father   . Prostate cancer Father   . Lung disease Father     black lung  . Liver cancer Maternal Grandmother   . Liver disease Maternal Grandmother   . Polymyalgia rheumatica Sister   . Heart disease Sister   . Coronary artery disease Brother     heart attack  . COPD Brother   . Colon polyps Sister   . Lung cancer Paternal Grandmother   . Colon  cancer      Both sides   Social History   Social History  . Marital status: Married    Spouse name: N/A  . Number of children: 1  . Years of education: N/A   Occupational History  . Homemaker    Social History Main Topics  . Smoking status: Former Smoker    Packs/day: 1.00    Years: 42.00    Types: Cigarettes    Quit date: 06/01/2015  . Smokeless tobacco: Never Used     Comment: would like to try chantix, but has not started on it due to warnings related to depression  . Alcohol use No  . Drug use: No  . Sexual activity: Yes   Other Topics Concern  . Not on file   Social History Narrative  . No narrative on file   Past Surgical History:  Procedure Laterality Date  . ABDOMINAL HYSTERECTOMY     age 31  . ANAL RECTAL MANOMETRY N/A 12/09/2015   Procedure: ANO RECTAL MANOMETRY;  Surgeon: Mauri Pole, MD;  Location: [facility];  Service: Endoscopy;  Laterality: N/A;  . BLADDER REPAIR     sling/with perforation  . CESAREAN SECTION     1 time  . KNEE ARTHROSCOPY Left   . ruptured uterus    . SHOULDER ARTHROSCOPY     multiple   Past Medical History:  Diagnosis Date  .  Anxiety   . Bowel habit changes    excessive gas /frequent BM's with change in shape with pus  . Chronic left shoulder pain    frozen   . Chronic pain    left upper shoulder ,neck and back  . COPD (chronic obstructive pulmonary disease) ([place])    Dr. Redge Gainer  . Depression   . GERD (gastroesophageal reflux disease)   . Hyperlipidemia   . Maternal complication related to childbirth    Sepsis, staph  . Meniere's disease   . Nicotine addiction    2 packs per day   . Rapid or irregular heartbeat   . Skin cancer    Rt. leg, squamous cell  . Tubular adenoma of colon 01/31/2000   Dr. Richmond Campbell  . Weakness of pelvic floor    prolapse -  MRI from 2017   There were no vitals taken for this visit.  Opioid Risk Score:   Fall Risk Score:  `1  Depression screen PHQ 2/9  Depression  screen [facility] 2/9 07/15/2016 06/27/2016 06/07/2016 04/27/2016 03/24/2016 03/14/2016 03/10/2016  Decreased Interest 0 0 0 1 0 0 0  Down, Depressed, Hopeless 0 0 0 1 0 0 0  PHQ - 2 Score 0 0 0 2 0 0 0  Altered sleeping - - - 2 - - -  Tired, decreased energy - - - 1 - - -  Change in appetite - - - 1 - - -  Feeling bad or failure about yourself  - - - 0 - - -  Trouble concentrating - - - 1 - - -  Moving slowly or fidgety/restless - - - 1 - - -  Suicidal thoughts - - - 0 - - -  PHQ-9 Score - - - 8 - - -  Difficult doing work/chores - - - Somewhat difficult - - -   Review of Systems  HENT: Negative.   Eyes: Negative.   Respiratory: Negative.   Cardiovascular: Negative.   Gastrointestinal: Negative.   Endocrine: Negative.   Genitourinary: Positive for difficulty urinating.       Bowel control   Musculoskeletal:       Spasms  Skin: Negative.   Allergic/Immunologic: Negative.   Neurological: Positive for dizziness, tremors, weakness and numbness.       Tingling  Psychiatric/Behavioral: Positive for confusion. The patient is nervous/anxious.   All other systems reviewed and are negative.      Objective:   Physical Exam  Constitutional: She is oriented to person, place, and time. She appears well-developed and well-nourished.  HENT:  Head: Normocephalic and atraumatic.  Neck: Normal range of motion. Neck supple.  Cardiovascular: Normal rate and regular rhythm.   Pulmonary/Chest: Effort normal and breath sounds normal.  Musculoskeletal:  Normal Muscle Bulk and Muscle Testing Reveals: Upper Extremities: Right: Full ROM and Muscle Strength 5/5 Left: Decreased ROM 35 Degrees and Muscle Strength 4/5 Left: AC Joint Tenderness Thoracic Hypersensitivity: T-1-T-7 Mainly Left Side Lower Extremities: Full ROM and Muscle Strength 5/5 Arises from Table with ease Narrow Based Gait    Neurological: She is alert and oriented to person, place, and time.  Skin: Skin is warm and dry.  Psychiatric:  She has a normal mood and affect.  Nursing note and vitals reviewed.         Assessment & Plan:  1. Post-traumatic arthritis left shoulder s/p 2 shoulder surgeries. Now with adhesive capsulitis left shoulder. :  Refilled:oxyCODONE 5/325mg  one tablet every 8 hours as needed. #  70. Second script given for the following month. We will continue the opioid monitoring program, this consists of regular clinic visits, examinations, urine drug screen, pill counts as well as use of [place] Controlled Substance Reporting System. 2. Cervicalgia, spondylosis: Continue Current Medication Regime.Lidoderm patches, Robaxin, Naprosyn, and Gabapentin 3. Depression/insomnia/anxiety: Continue Lexapro. Continue Ambien 5 mg HS #30. Dr. Livia Snellen prescribing Xanax.    20 minutes of face to face patient care time was spent during this visit. All questions were encouraged and answered  F/U in 2 months

## 2016-08-22 ENCOUNTER — Other Ambulatory Visit: Payer: Self-pay | Admitting: Family Medicine

## 2016-09-07 ENCOUNTER — Encounter: Payer: Medicare Other | Admitting: *Deleted

## 2016-09-07 ENCOUNTER — Encounter: Payer: Self-pay | Admitting: *Deleted

## 2016-09-15 DIAGNOSIS — L738 Other specified follicular disorders: Secondary | ICD-10-CM | POA: Diagnosis not present

## 2016-09-15 DIAGNOSIS — L02411 Cutaneous abscess of right axilla: Secondary | ICD-10-CM | POA: Diagnosis not present

## 2016-09-27 ENCOUNTER — Ambulatory Visit (INDEPENDENT_AMBULATORY_CARE_PROVIDER_SITE_OTHER): Payer: Medicare Other | Admitting: Family Medicine

## 2016-09-27 ENCOUNTER — Encounter: Payer: Self-pay | Admitting: Family Medicine

## 2016-09-27 VITALS — BP 108/68 | HR 66 | Temp 97.3°F | Ht 66.0 in | Wt 155.0 lb

## 2016-09-27 DIAGNOSIS — K582 Mixed irritable bowel syndrome: Secondary | ICD-10-CM

## 2016-09-27 DIAGNOSIS — F411 Generalized anxiety disorder: Secondary | ICD-10-CM

## 2016-09-27 DIAGNOSIS — F339 Major depressive disorder, recurrent, unspecified: Secondary | ICD-10-CM

## 2016-09-27 DIAGNOSIS — G4701 Insomnia due to medical condition: Secondary | ICD-10-CM | POA: Diagnosis not present

## 2016-09-27 DIAGNOSIS — M19012 Primary osteoarthritis, left shoulder: Secondary | ICD-10-CM | POA: Diagnosis not present

## 2016-09-27 DIAGNOSIS — M47812 Spondylosis without myelopathy or radiculopathy, cervical region: Secondary | ICD-10-CM

## 2016-09-27 DIAGNOSIS — E782 Mixed hyperlipidemia: Secondary | ICD-10-CM

## 2016-09-27 MED ORDER — ELUXADOLINE 75 MG PO TABS
75.0000 mg | ORAL_TABLET | Freq: Two times a day (BID) | ORAL | 2 refills | Status: DC
Start: 2016-09-27 — End: 2016-12-30

## 2016-09-27 NOTE — Progress Notes (Signed)
Subjective:  Patient ID: Cassandra [name], female    DOB: Mar 30, 1956  Age: 61 y.o. MRN: [id number]  CC: Hyperlipidemia (pt here today for routine follow up on cholesterol and also discuss what the dermatologist said)   HPI Cassandra [name] presents for Recurrent IBS symptoms. Intermittent constipation and diarrhea alternating randomly. Also cramping. She has times when she feels weak and sweaty like her blood sugars dropping. She also is having trouble sleeping now because she ran out of her Ambien. She has some opiate-induced constipation. She sees pain clinic and gets her opiate there as well as gabapentin. She has some history of anxiety and almost certainly has exacerbation of that caused by the use of opiates. Unfortunately, she also says that she hurts all over. This is apparently in spite of the use of the opiate.  Depression screen [facility] 2/9 09/27/2016 07/15/2016 06/27/2016 06/07/2016 04/27/2016  Decreased Interest 3 0 0 0 1  Down, Depressed, Hopeless 2 0 0 0 1  PHQ - 2 Score 5 0 0 0 2  Altered sleeping 2 - - - 2  Tired, decreased energy 3 - - - 1  Change in appetite 2 - - - 1  Feeling bad or failure about yourself  1 - - - 0  Trouble concentrating 1 - - - 1  Moving slowly or fidgety/restless 2 - - - 1  Suicidal thoughts 0 - - - 0  PHQ-9 Score 16 - - - 8  Difficult doing work/chores - - - - Somewhat difficult   GAD 7 : Generalized Anxiety Score 09/27/2016 12/07/2015  Nervous, Anxious, on Edge 0 0  Control/stop worrying 1 1  Worry too much - different things 1 1  Trouble relaxing 3 3  Restless 2 1  Easily annoyed or irritable 3 1  Afraid - awful might happen 0 0  Total GAD 7 Score 10 7  Anxiety Difficulty - Somewhat difficult       History Cassandra [name] has a past medical history of Anxiety; Bowel habit changes; Chronic left shoulder pain; Chronic pain; COPD (chronic obstructive pulmonary disease) ([place]); Depression; GERD (gastroesophageal reflux disease); Hyperlipidemia; Maternal  complication related to childbirth; Meniere's disease; Nicotine addiction; Rapid or irregular heartbeat; Skin cancer; Tubular adenoma of colon (01/31/2000); and Weakness of pelvic floor.   She has a past surgical history that includes Bladder repair; Shoulder arthroscopy; Abdominal hysterectomy; Knee arthroscopy (Left); Cesarean section; ruptured uterus; and Anal Rectal manometry (N/A, 12/09/2015).   Her family history includes COPD in her brother; Colon polyps in her sister; Coronary artery disease in her brother, father, and mother; Diabetes in her mother; Heart disease in her sister; Kidney disease in her mother; Liver cancer in her maternal grandmother; Liver disease in her maternal grandmother; Lung cancer in her paternal grandmother; Lung disease in her father; Polymyalgia rheumatica in her sister; Prostate cancer in her father.She reports that she quit smoking about 15 months ago. Her smoking use included Cigarettes. She has a 42.00 pack-year smoking history. She has never used smokeless tobacco. She reports that she does not drink alcohol or use drugs.    ROS Review of Systems  Constitutional: Negative for activity change, appetite change and fever.  HENT: Negative for congestion, rhinorrhea and sore throat.   Eyes: Negative for visual disturbance.  Respiratory: Negative for cough and shortness of breath.   Cardiovascular: Negative for chest pain and palpitations.  Gastrointestinal: Positive for abdominal pain, constipation and diarrhea. Negative for nausea.  Genitourinary: Negative for dysuria.  Musculoskeletal: Positive for arthralgias (followed by pain clinic. Still using oxycodone). Negative for myalgias.  Psychiatric/Behavioral: Positive for dysphoric mood. The patient is nervous/anxious.     Objective:  BP 108/68   Pulse 66   Temp 97.3 F (36.3 C) (Oral)   Ht 5\' 6"  (1.676 m)   Wt 155 lb (70.3 kg)   BMI 25.02 kg/m   BP Readings from Last 3 Encounters:  09/27/16 108/68    08/17/16 122/72  07/15/16 109/68    Wt Readings from Last 3 Encounters:  09/27/16 155 lb (70.3 kg)  07/15/16 151 lb 8 oz (68.7 kg)  06/27/16 152 lb (68.9 kg)     Physical Exam  Constitutional: She is oriented to person, place, and time. She appears well-developed and well-nourished. No distress.  HENT:  Head: Normocephalic and atraumatic.  Right Ear: External ear normal.  Left Ear: External ear normal.  Nose: Nose normal.  Mouth/Throat: Oropharynx is clear and moist.  Eyes: Conjunctivae and EOM are normal. Pupils are equal, round, and reactive to light.  Neck: Normal range of motion. Neck supple. No thyromegaly present.  Cardiovascular: Normal rate, regular rhythm and normal heart sounds.   No murmur heard. Pulmonary/Chest: Effort normal and breath sounds normal. No respiratory distress. She has no wheezes. She has no rales.  Abdominal: Soft. Bowel sounds are normal. She exhibits no distension. There is no tenderness.  Lymphadenopathy:    She has no cervical adenopathy.  Neurological: She is alert and oriented to person, place, and time. She has normal reflexes.  Skin: Skin is warm and dry.  Psychiatric: She has a normal mood and affect. Her behavior is normal. Judgment and thought content normal.     Lab Results  Component Value Date   WBC 6.9 03/24/2016   HGB 14.7 12/10/2013   HCT 42.0 03/24/2016   PLT 312 03/24/2016   GLUCOSE 98 03/24/2016   CHOL 254 (H) 03/24/2016   TRIG 86 03/24/2016   HDL 64 03/24/2016   LDLCALC 173 (H) 03/24/2016   ALT 10 03/24/2016   AST 19 03/24/2016   NA 141 03/24/2016   K 4.4 03/24/2016   CL 98 03/24/2016   CREATININE 1.10 (H) 03/24/2016   BUN 21 03/24/2016   CO2 25 03/24/2016   TSH 0.602 03/24/2016    No results found.  Assessment & Plan:   Cassandra [name] was seen today for hyperlipidemia.  Diagnoses and all orders for this visit:  Primary osteoarthritis of left shoulder  Cervical spondylosis without myelopathy  Irritable bowel  syndrome with both constipation and diarrhea  Insomnia due to medical condition  Episode of recurrent major depressive disorder, unspecified depression episode severity (HCC)  GAD (generalized anxiety disorder)  Mixed hyperlipidemia  Other orders -     Eluxadoline (VIBERZI) 75 MG TABS; Take 75 mg by mouth 2 (two) times daily. For irritable bowel    I have discontinued Cassandra [name]'s lubiprostone. I am also having her start on Eluxadoline. Additionally, I am having her maintain her lidocaine, conjugated estrogens, fexofenadine, meclizine, hydroxypropyl methylcellulose / hypromellose, chlorhexidine, mupirocin ointment, ALPRAZolam, zolpidem, naproxen, omeprazole, gabapentin, methocarbamol, oxyCODONE-acetaminophen, escitalopram, and gabapentin.  Meds ordered this encounter  Medications  . gabapentin (NEURONTIN) 100 MG capsule    Sig: Take 100 mg by mouth 2 (two) times daily.    Refill:  3  . Eluxadoline (VIBERZI) 75 MG TABS    Sig: Take 75 mg by mouth 2 (two) times daily. For irritable bowel    Dispense:  60 tablet  Refill:  2     Follow-up: Return in about 3 months (around 12/26/2016) for IBS, anxiety.  [name], M.D.

## 2016-10-18 ENCOUNTER — Encounter: Payer: Self-pay | Admitting: Physical Medicine & Rehabilitation

## 2016-10-18 ENCOUNTER — Encounter: Payer: Medicare Other | Attending: Physical Medicine & Rehabilitation | Admitting: Physical Medicine & Rehabilitation

## 2016-10-18 VITALS — BP 142/85 | HR 84 | Resp 14

## 2016-10-18 DIAGNOSIS — M7918 Myalgia, other site: Secondary | ICD-10-CM

## 2016-10-18 DIAGNOSIS — M25512 Pain in left shoulder: Secondary | ICD-10-CM | POA: Insufficient documentation

## 2016-10-18 DIAGNOSIS — M5412 Radiculopathy, cervical region: Secondary | ICD-10-CM | POA: Diagnosis not present

## 2016-10-18 DIAGNOSIS — M7631 Iliotibial band syndrome, right leg: Secondary | ICD-10-CM | POA: Diagnosis not present

## 2016-10-18 DIAGNOSIS — M7501 Adhesive capsulitis of right shoulder: Secondary | ICD-10-CM | POA: Diagnosis not present

## 2016-10-18 DIAGNOSIS — M19112 Post-traumatic osteoarthritis, left shoulder: Secondary | ICD-10-CM | POA: Diagnosis not present

## 2016-10-18 DIAGNOSIS — G894 Chronic pain syndrome: Secondary | ICD-10-CM | POA: Diagnosis not present

## 2016-10-18 DIAGNOSIS — G8929 Other chronic pain: Secondary | ICD-10-CM | POA: Insufficient documentation

## 2016-10-18 DIAGNOSIS — F411 Generalized anxiety disorder: Secondary | ICD-10-CM

## 2016-10-18 DIAGNOSIS — M7502 Adhesive capsulitis of left shoulder: Secondary | ICD-10-CM

## 2016-10-18 DIAGNOSIS — Z5181 Encounter for therapeutic drug level monitoring: Secondary | ICD-10-CM

## 2016-10-18 DIAGNOSIS — M542 Cervicalgia: Secondary | ICD-10-CM | POA: Diagnosis not present

## 2016-10-18 DIAGNOSIS — M791 Myalgia: Secondary | ICD-10-CM

## 2016-10-18 DIAGNOSIS — B3749 Other urogenital candidiasis: Secondary | ICD-10-CM

## 2016-10-18 DIAGNOSIS — Z79899 Other long term (current) drug therapy: Secondary | ICD-10-CM | POA: Diagnosis not present

## 2016-10-18 MED ORDER — OXYCODONE-ACETAMINOPHEN 5-325 MG PO TABS: 1.0000 | ORAL_TABLET | Freq: Three times a day (TID) | ORAL | 0 refills | Status: DC | PRN

## 2016-10-18 MED ORDER — FLUCONAZOLE 100 MG PO TABS: 100.0000 mg | ORAL_TABLET | Freq: Every day | ORAL | 0 refills | Status: DC

## 2016-10-18 NOTE — Patient Instructions (Signed)
PLEASE FEEL FREE TO CALL OUR OFFICE WITH ANY PROBLEMS OR QUESTIONS ([phone])      

## 2016-10-18 NOTE — Progress Notes (Signed)
Subjective:    Patient ID: Cassandra [name], female    DOB: 1956-04-10, 61 y.o.   MRN: [id number]  HPI  Cassandra [name] is here in follow up of her chronic shoulder pain. She has reported tingling/pain in her right shoulder which is radiating to her right chest and up to the right portion of her head. It has been going on for about 3 weeks now lasting for about 5 minutes. They have been happening once or twice a day. She doesn't recall any precipitating causes. Her left shoulder remains quite tender. She is limited with basic rom of her left shoulder and neck due to pain. She denies any frank headaches or visual phenomena. She hasn't lost consciousness.   She has been struggling with axillary adenitis and has had infections flare up recently. She has been on antibiotics as a result which have led to a urinary yeast infeciton.    Otherwise she remains on medications as ive prescribed including percocet.     Pain Inventory Average Pain 7 Pain Right Now 7 My pain is constant, sharp, burning, dull, stabbing, tingling and aching  In the last 24 hours, has pain interfered with the following? General activity 8 Relation with others 8 Enjoyment of life 8 What TIME of day is your pain at its worst? morning, evening, night Sleep (in general) Poor  Pain is worse with: bending, inactivity and some activites Pain improves with: heat/ice, pacing activities and medication Relief from Meds: 5  Mobility walk without assistance ability to climb steps?  yes do you drive?  yes  Function disabled: date disabled . I need assistance with the following:  household duties and shopping  Neuro/Psych bladder control problems bowel control problems weakness numbness tremor tingling spasms dizziness confusion  Prior Studies Any changes since last visit?  no bone scan x-rays CT/MRI nerve study  Physicians involved in your care Any changes since last visit?  no   Family History  Problem Relation Age  of Onset  . Coronary artery disease Mother   . Diabetes Mother     borderline  . Kidney disease Mother     stage 4  . Coronary artery disease Father   . Prostate cancer Father   . Lung disease Father     black lung  . Liver cancer Maternal Grandmother   . Liver disease Maternal Grandmother   . Polymyalgia rheumatica Sister   . Heart disease Sister   . Coronary artery disease Brother     heart attack  . COPD Brother   . Colon polyps Sister   . Lung cancer Paternal Grandmother   . Colon cancer      Both sides   Social History   Social History  . Marital status: Married    Spouse name: N/A  . Number of children: 1  . Years of education: N/A   Occupational History  . Homemaker    Social History Main Topics  . Smoking status: Former Smoker    Packs/day: 1.00    Years: 42.00    Types: Cigarettes    Quit date: 06/01/2015  . Smokeless tobacco: Never Used     Comment: would like to try chantix, but has not started on it due to warnings related to depression  . Alcohol use No  . Drug use: No  . Sexual activity: Yes   Other Topics Concern  . None   Social History Narrative  . None   Past Surgical History:  Procedure Laterality Date  .  ABDOMINAL HYSTERECTOMY     age 88  . ANAL RECTAL MANOMETRY N/A 12/09/2015   Procedure: ANO RECTAL MANOMETRY;  Surgeon: Mauri Pole, MD;  Location: [facility];  Service: Endoscopy;  Laterality: N/A;  . BLADDER REPAIR     sling/with perforation  . CESAREAN SECTION     1 time  . KNEE ARTHROSCOPY Left   . ruptured uterus    . SHOULDER ARTHROSCOPY     multiple   Past Medical History:  Diagnosis Date  . Anxiety   . Bowel habit changes    excessive gas /frequent BM's with change in shape with pus  . Chronic left shoulder pain    frozen   . Chronic pain    left upper shoulder ,neck and back  . COPD (chronic obstructive pulmonary disease) ([place])    Dr. Redge Gainer  . Depression   . GERD (gastroesophageal reflux disease)     . Hyperlipidemia   . Maternal complication related to childbirth    Sepsis, staph  . Meniere's disease   . Nicotine addiction    2 packs per day   . Rapid or irregular heartbeat   . Skin cancer    Rt. leg, squamous cell  . Tubular adenoma of colon 01/31/2000   Dr. Richmond Campbell  . Weakness of pelvic floor    prolapse -  MRI from 2017   BP (!) 142/85   Pulse 84   Resp 14   SpO2 93%   Opioid Risk Score:   Fall Risk Score:  `1  Depression screen PHQ 2/9  Depression screen [facility] 2/9 09/27/2016 07/15/2016 06/27/2016 06/07/2016 04/27/2016 03/24/2016 03/14/2016  Decreased Interest 3 0 0 0 1 0 0  Down, Depressed, Hopeless 2 0 0 0 1 0 0  PHQ - 2 Score 5 0 0 0 2 0 0  Altered sleeping 2 - - - 2 - -  Tired, decreased energy 3 - - - 1 - -  Change in appetite 2 - - - 1 - -  Feeling bad or failure about yourself  1 - - - 0 - -  Trouble concentrating 1 - - - 1 - -  Moving slowly or fidgety/restless 2 - - - 1 - -  Suicidal thoughts 0 - - - 0 - -  PHQ-9 Score 16 - - - 8 - -  Difficult doing work/chores - - - - Somewhat difficult - -   2 Review of Systems  Constitutional: Positive for diaphoresis and unexpected weight change.  HENT: Negative.   Respiratory: Negative.   Gastrointestinal: Positive for constipation, nausea and vomiting.  Endocrine: Negative.   Genitourinary: Positive for difficulty urinating.  Musculoskeletal:       Spasms   Skin: Positive for rash.  Allergic/Immunologic: Negative.   Neurological: Positive for dizziness, tremors, weakness and numbness.       Tingling  Psychiatric/Behavioral: Positive for confusion.  All other systems reviewed and are negative.      Objective:   Physical Exam  General: Alert and oriented x 3, No apparent distress.    HEENT: Head is normocephalic, atraumatic, PERRLA, EOMI, sclera anicteric, oral mucosa pink and moist, dentition intact, ext ear canals clear,  Neck: Supple without JVD or lymphadenopathy  Heart: RRR Chest: CTA B.    Abdomen: soft BS+ Extremities: No clubbing, cyanosis, or edema. Pulses are 2+  Skin: visible skin intact  Neuro: Pt is cognitively appropriate with normal insight, memory, and awareness. Cranial nerves 2-12 are intact. Sensory  exam is normal. Reflexes are 2+ in all 4's. Fine motor coordination is intact. No tremors. Motor function is grossly 5/5.  Musculoskeletal: left shoulder remains very limited in ROM.  She has spasm along the left trap and SCM which are unchanged.  Shoulder girdle positioning and head posture is fair.Marland Kitchen Spurling's test negative again. Right shoulder and neck ROM is normal .  Psych: Pt's affect is appropriate. Remains pleasant as always.     Assessment & Plan:   1. Post-traumatic arthritis left shoulder s/p 2 shoulder surgeries. Now with adhesive capsulitis left shoulder.  2. Cervicalgia, spondylosis.  3. Right ITB syndrome  4. Depression/insomnia/anxiety  5. Right chest/head tingling? ?source, ?atypical migraine 6. Yeast infection in urine:    Plan:  1. Continue TENS as this has been effective  2. Reviewed her symptoms at length. These don't appear physiologic and could be a somatic presentation of her emotional stress. For now advised her that these weren't much to worry about. When they happen she should relax and find ways to distract herself from the symptoms. If they persist, she should look for any other associated symptoms. If they progress, we can consider further work up.  3. Gabapentin continued  4. Naproxen 500mg  bid  5. ITB stretching and exercises  6. Refilled percocet for breakthrough pain.  7. Continue traction/posture exercises for neck as possible  8. Lexapro for depression/anxiety at 20mg  daily  9. Wrote an rx for diflucan 100mg  x 5 days.  10. I or NP will see her back in about 2 month. 25 minutes of face to face patient care time were spent during this visit. All questions were encouraged and answered. Greater than 50% of time during this  encounter was spent counseling patient/family in regard to her atpyical chest/neck symptom and potential causes. Marland Kitchen

## 2016-10-18 NOTE — Addendum Note (Signed)
Addended by: Elinor Parkinson I on: 10/18/2016 02:13 PM   Modules accepted: Orders

## 2016-10-24 LAB — TOXASSURE SELECT,+ANTIDEPR,UR

## 2016-10-31 NOTE — Progress Notes (Signed)
Urine drug screen for this encounter is consistent for prescribed medication 

## 2016-11-07 ENCOUNTER — Other Ambulatory Visit: Payer: Self-pay | Admitting: *Deleted

## 2016-11-07 MED ORDER — NAPROXEN 500 MG PO TABS: ORAL_TABLET | ORAL | 1 refills | Status: DC

## 2016-11-07 NOTE — Telephone Encounter (Signed)
Last seen stacks = 09/2016. Pharm request 90 day supply

## 2016-11-26 ENCOUNTER — Other Ambulatory Visit: Payer: Self-pay | Admitting: Registered Nurse

## 2016-11-28 NOTE — Telephone Encounter (Signed)
Recieved electronic refill request for methocarbamol, no mention in previous notes as to continue this medication, please advise

## 2016-12-09 ENCOUNTER — Other Ambulatory Visit: Payer: Self-pay | Admitting: Physical Medicine & Rehabilitation

## 2016-12-16 ENCOUNTER — Encounter: Payer: Self-pay | Admitting: Registered Nurse

## 2016-12-16 ENCOUNTER — Telehealth: Payer: Self-pay | Admitting: Registered Nurse

## 2016-12-16 ENCOUNTER — Encounter: Payer: Medicare Other | Attending: Physical Medicine & Rehabilitation | Admitting: Registered Nurse

## 2016-12-16 VITALS — BP 130/78 | HR 61

## 2016-12-16 DIAGNOSIS — G4709 Other insomnia: Secondary | ICD-10-CM

## 2016-12-16 DIAGNOSIS — G894 Chronic pain syndrome: Secondary | ICD-10-CM | POA: Diagnosis not present

## 2016-12-16 DIAGNOSIS — M25512 Pain in left shoulder: Secondary | ICD-10-CM | POA: Insufficient documentation

## 2016-12-16 DIAGNOSIS — F411 Generalized anxiety disorder: Secondary | ICD-10-CM | POA: Diagnosis present

## 2016-12-16 DIAGNOSIS — Z5181 Encounter for therapeutic drug level monitoring: Secondary | ICD-10-CM | POA: Diagnosis not present

## 2016-12-16 DIAGNOSIS — M7501 Adhesive capsulitis of right shoulder: Secondary | ICD-10-CM | POA: Diagnosis not present

## 2016-12-16 DIAGNOSIS — M7631 Iliotibial band syndrome, right leg: Secondary | ICD-10-CM | POA: Diagnosis not present

## 2016-12-16 DIAGNOSIS — Z79899 Other long term (current) drug therapy: Secondary | ICD-10-CM | POA: Diagnosis not present

## 2016-12-16 DIAGNOSIS — M7502 Adhesive capsulitis of left shoulder: Secondary | ICD-10-CM | POA: Insufficient documentation

## 2016-12-16 DIAGNOSIS — M5412 Radiculopathy, cervical region: Secondary | ICD-10-CM

## 2016-12-16 DIAGNOSIS — M7918 Myalgia, other site: Secondary | ICD-10-CM

## 2016-12-16 DIAGNOSIS — M542 Cervicalgia: Secondary | ICD-10-CM

## 2016-12-16 DIAGNOSIS — M791 Myalgia: Secondary | ICD-10-CM | POA: Insufficient documentation

## 2016-12-16 DIAGNOSIS — F3289 Other specified depressive episodes: Secondary | ICD-10-CM | POA: Diagnosis not present

## 2016-12-16 DIAGNOSIS — G8929 Other chronic pain: Secondary | ICD-10-CM | POA: Insufficient documentation

## 2016-12-16 MED ORDER — METHOCARBAMOL 500 MG PO TABS: 500.0000 mg | ORAL_TABLET | Freq: Two times a day (BID) | ORAL | 2 refills | Status: DC | PRN

## 2016-12-16 MED ORDER — OXYCODONE-ACETAMINOPHEN 5-325 MG PO TABS: 1.0000 | ORAL_TABLET | Freq: Three times a day (TID) | ORAL | 0 refills | Status: DC | PRN

## 2016-12-16 NOTE — Telephone Encounter (Signed)
On 12/16/2016 the [place] was reviewed no conflict was seen on the [place] with multiple prescribers. Cassandra [name] has a signed narcotic contract with our office. If there were any discrepancies this would have been reported to her physician.

## 2016-12-16 NOTE — Progress Notes (Signed)
Subjective:    Patient ID: Cassandra [name], female    DOB: [birth year], 61 y.o.   MRN: [id number]  HPI:  [name] is a 61year old female who returns for follow up appointment for chronic pain and medication refill. She statesher pain is located in her neck radiating into her bilateral upper extremities with tingling and numbness, also has  left shoulder and left arm pain, and upper back mainly left side. Sheratesher pain 8. Her current exercise regime is walking.  Also states occasionally she has tingling and numbness in her right shoulder which radiates into her right chest, she denies SOB or chest pain. Encouraged to follow up with her PCP, if she develops chest pain she was instructed to call 911, she verbalizes understanding.    Pain Inventory Average Pain 8 Pain Right Now 8 My pain is sharp, burning, dull, stabbing, tingling and aching  In the last 24 hours, has pain interfered with the following? General activity 9 Relation with others 9 Enjoyment of life 9 What TIME of day is your pain at its worst? all Sleep (in general) Poor  Pain is worse with: walking, bending, sitting, inactivity and standing Pain improves with: rest, heat/ice, therapy/exercise, pacing activities and medication Relief from Meds: 7  Mobility walk without assistance ability to climb steps?  yes do you drive?  yes  Function disabled: date disabled 2007  Neuro/Psych bladder control problems bowel control problems weakness numbness tremor tingling trouble walking spasms dizziness confusion depression anxiety  Prior Studies Any changes since last visit?  no  Physicians involved in your care Any changes since last visit?  no   Family History  Problem Relation Age of Onset  . Coronary artery disease Mother   . Diabetes Mother     borderline  . Kidney disease Mother     stage 4  . Coronary artery disease Father   . Prostate cancer Father   . Lung disease Father     black  lung  . Liver cancer Maternal Grandmother   . Liver disease Maternal Grandmother   . Polymyalgia rheumatica Sister   . Heart disease Sister   . Coronary artery disease Brother     heart attack  . COPD Brother   . Colon polyps Sister   . Lung cancer Paternal Grandmother   . Colon cancer      Both sides   Social History   Social History  . Marital status: Married    Spouse name: N/A  . Number of children: 1  . Years of education: N/A   Occupational History  . Homemaker    Social History Main Topics  . Smoking status: Former Smoker    Packs/day: 1.00    Years: 42.00    Types: Cigarettes    Quit date: 06/01/2015  . Smokeless tobacco: Never Used     Comment: would like to try chantix, but has not started on it due to warnings related to depression  . Alcohol use No  . Drug use: No  . Sexual activity: Yes   Other Topics Concern  . Not on file   Social History Narrative  . No narrative on file   Past Surgical History:  Procedure Laterality Date  . ABDOMINAL HYSTERECTOMY     age 36  . ANAL RECTAL MANOMETRY N/A 12/09/2015   Procedure: ANO RECTAL MANOMETRY;  Surgeon: Mauri Pole, MD;  Location: [facility];  Service: Endoscopy;  Laterality: N/A;  . BLADDER REPAIR  sling/with perforation  . CESAREAN SECTION     1 time  . KNEE ARTHROSCOPY Left   . ruptured uterus    . SHOULDER ARTHROSCOPY     multiple   Past Medical History:  Diagnosis Date  . Anxiety   . Bowel habit changes    excessive gas /frequent BM's with change in shape with pus  . Chronic left shoulder pain    frozen   . Chronic pain    left upper shoulder ,neck and back  . COPD (chronic obstructive pulmonary disease) ([place])    Dr. Redge Gainer  . Depression   . GERD (gastroesophageal reflux disease)   . Hyperlipidemia   . Maternal complication related to childbirth    Sepsis, staph  . Meniere's disease   . Nicotine addiction    2 packs per day   . Rapid or irregular heartbeat   . Skin  cancer    Rt. leg, squamous cell  . Tubular adenoma of colon 01/31/2000   Dr. Richmond Campbell  . Weakness of pelvic floor    prolapse -  MRI from 2017   There were no vitals taken for this visit.  Opioid Risk Score:   Fall Risk Score:  `1  Depression screen PHQ 2/9  Depression screen [facility] 2/9 09/27/2016 07/15/2016 06/27/2016 06/07/2016 04/27/2016 03/24/2016 03/14/2016  Decreased Interest 3 0 0 0 1 0 0  Down, Depressed, Hopeless 2 0 0 0 1 0 0  PHQ - 2 Score 5 0 0 0 2 0 0  Altered sleeping 2 - - - 2 - -  Tired, decreased energy 3 - - - 1 - -  Change in appetite 2 - - - 1 - -  Feeling bad or failure about yourself  1 - - - 0 - -  Trouble concentrating 1 - - - 1 - -  Moving slowly or fidgety/restless 2 - - - 1 - -  Suicidal thoughts 0 - - - 0 - -  PHQ-9 Score 16 - - - 8 - -  Difficult doing work/chores - - - - Somewhat difficult - -    Review of Systems  Constitutional: Positive for activity change, appetite change, chills, fatigue, fever and unexpected weight change.  HENT: Negative.   Eyes: Negative.   Respiratory: Positive for cough and shortness of breath.   Cardiovascular: Negative.   Gastrointestinal: Positive for abdominal pain, constipation, diarrhea, nausea and vomiting.  Endocrine: Negative.   Genitourinary: Positive for difficulty urinating and dysuria.  Musculoskeletal: Negative.   Skin: Positive for rash.  Allergic/Immunologic: Negative.   Neurological: Negative.   Hematological: Negative.   Psychiatric/Behavioral: Negative.   All other systems reviewed and are negative.      Objective:   Physical Exam  Constitutional: She is oriented to person, place, and time. She appears well-developed and well-nourished.  HENT:  Head: Normocephalic and atraumatic.  Neck: Normal range of motion. Neck supple.  Cervical Hypersensitivity: C-7  Cardiovascular: Normal rate and regular rhythm.   Pulmonary/Chest: Effort normal and breath sounds normal.  Musculoskeletal:  Normal  Muscle Bulk and Muscle Testing Reveals: Upper Extremities: Right: Full ROM and Muscle Strength 5/5 Left: Decreased ROM 30 Degrees and Muscle Strength 4/5 Thoracic Hypersensitivity: T-1- T-6 Lower Extremities: Full ROM and Muscle Strength 5/5 Left Lower Extremity Flexion Produces Pain into Patella Arises from chair with ease Narrow Based Gait  Neurological: She is alert and oriented to person, place, and time.  Skin: Skin is warm and dry.  Psychiatric:  She has a normal mood and affect.  Nursing note and vitals reviewed.         Assessment & Plan:  1. Post-traumatic arthritis left shoulder s/p 2 shoulder surgeries. Now with adhesive capsulitis left shoulder. 12/16/2016 Refilled:oxyCODONE 5/325mg  one tablet every 8 hours as needed. #70. Second script given for the following month. We will continue the opioid monitoring program, this consists of regular clinic visits, examinations, urine drug screen, pill counts as well as use of [place] Controlled Substance Reporting System. 2. Cervicalgia, spondylosis: Refuses Cervical X-ray: Reviewed Cervical X-ray and MRI from 2015. Continue Current Medication Regime.Lidoderm patches, Robaxin, Naprosyn, and Gabapentin. 12/16/2016 3. Depression/insomnia/anxiety: Continue Lexapro. Continue Ambien 5 mg HS #30. Dr. Livia Snellen prescribing Xanax.   20 minutes of face to face patient care time was spent during this visit. All questions were encouraged and answered.   F/U in 2 months

## 2016-12-26 ENCOUNTER — Other Ambulatory Visit: Payer: Self-pay | Admitting: Physical Medicine & Rehabilitation

## 2016-12-26 ENCOUNTER — Other Ambulatory Visit: Payer: Self-pay | Admitting: Family Medicine

## 2016-12-26 DIAGNOSIS — B3749 Other urogenital candidiasis: Secondary | ICD-10-CM

## 2016-12-27 NOTE — Telephone Encounter (Signed)
Recieved electronic medication refill request for fluconazole, spoke with patient on phone and she confirmed she is still having symptoms of yeast/thrush on tongue and mouth area, no mention of refilling this medication in last note, please advise

## 2016-12-29 ENCOUNTER — Telehealth: Payer: Self-pay | Admitting: Family Medicine

## 2016-12-29 NOTE — Telephone Encounter (Signed)
Appt made for patient to be seen 12/30/16 for 3 month follow up on anxiety and IBS with Dr. Livia Snellen.  Patient verbalized understanding

## 2016-12-29 NOTE — Telephone Encounter (Signed)
What is the name of the medication? Xanax, ambien  Have you contacted your pharmacy to request a refill? yes  Which pharmacy would you like this sent to? [facility] in [place].   Patient notified that their request is being sent to the clinical staff for review and that they should receive a call once it is complete. If they do not receive a call within 24 hours they can check with their pharmacy or our office.

## 2016-12-30 ENCOUNTER — Encounter: Payer: Self-pay | Admitting: Family Medicine

## 2016-12-30 ENCOUNTER — Other Ambulatory Visit: Payer: Self-pay | Admitting: Family Medicine

## 2016-12-30 ENCOUNTER — Ambulatory Visit (INDEPENDENT_AMBULATORY_CARE_PROVIDER_SITE_OTHER): Payer: Medicare Other | Admitting: Family Medicine

## 2016-12-30 VITALS — BP 124/69 | HR 65 | Temp 97.3°F | Ht 66.0 in | Wt 157.0 lb

## 2016-12-30 DIAGNOSIS — F411 Generalized anxiety disorder: Secondary | ICD-10-CM | POA: Diagnosis not present

## 2016-12-30 DIAGNOSIS — G4701 Insomnia due to medical condition: Secondary | ICD-10-CM | POA: Diagnosis not present

## 2016-12-30 DIAGNOSIS — F3289 Other specified depressive episodes: Secondary | ICD-10-CM

## 2016-12-30 MED ORDER — ALPRAZOLAM 1 MG PO TABS: ORAL_TABLET | ORAL | 5 refills | Status: DC

## 2016-12-30 NOTE — Progress Notes (Signed)
Subjective:  Patient ID: Cassandra [name], female    DOB: September 30, 1955  Age: 61 y.o. MRN: [id number]  CC: Follow-up (pt here today following up on her anxiety and depression)   HPI Cassandra [name] presents for Follow-up for anxiety and depression. She is still having problems with loss of appetite and energy and not sleeping well. GAD 7 noted below she is still having trouble relaxing and is easily annoyed she is restless and worried that something awful might happen.  Depression screen [facility] 2/9 12/30/2016 09/27/2016 07/15/2016 06/27/2016 06/07/2016  Decreased Interest 3 3 0 0 0  Down, Depressed, Hopeless 2 2 0 0 0  PHQ - 2 Score 5 5 0 0 0  Altered sleeping 3 2 - - -  Tired, decreased energy 3 3 - - -  Change in appetite 3 2 - - -  Feeling bad or failure about yourself  2 1 - - -  Trouble concentrating 1 1 - - -  Moving slowly or fidgety/restless 1 2 - - -  Suicidal thoughts 0 0 - - -  PHQ-9 Score 18 16 - - -  Difficult doing work/chores - - - - -  Some recent data might be hidden   GAD 7 : Generalized Anxiety Score 12/30/2016 09/27/2016 12/07/2015  Nervous, Anxious, on Edge 1 0 0  Control/stop worrying 1 1 1   Worry too much - different things 1 1 1   Trouble relaxing 2 3 3   Restless 1 2 1   Easily annoyed or irritable 2 3 1   Afraid - awful might happen 2 0 0  Total GAD 7 Score 10 10 7   Anxiety Difficulty Extremely difficult - Somewhat difficult     History Cassandra [name] has a past medical history of Anxiety; Bowel habit changes; Chronic left shoulder pain; Chronic pain; COPD (chronic obstructive pulmonary disease) ([place]); Depression; GERD (gastroesophageal reflux disease); Hyperlipidemia; Maternal complication related to childbirth; Meniere's disease; Nicotine addiction; Rapid or irregular heartbeat; Skin cancer; Tubular adenoma of colon (01/31/2000); and Weakness of pelvic floor.   She has a past surgical history that includes Bladder repair; Shoulder arthroscopy; Abdominal hysterectomy; Knee  arthroscopy (Left); Cesarean section; ruptured uterus; and Anal Rectal manometry (N/A, 12/09/2015).   Her family history includes COPD in her brother; Colon polyps in her sister; Coronary artery disease in her brother, father, and mother; Diabetes in her mother; Heart disease in her sister; Kidney disease in her mother; Liver cancer in her maternal grandmother; Liver disease in her maternal grandmother; Lung cancer in her paternal grandmother; Lung disease in her father; Polymyalgia rheumatica in her sister; Prostate cancer in her father.She reports that she quit smoking about 19 months ago. Her smoking use included Cigarettes. She has a 42.00 pack-year smoking history. She has never used smokeless tobacco. She reports that she does not drink alcohol or use drugs.    ROS Review of Systems  Constitutional: Negative for activity change, appetite change and fever.  HENT: Negative for congestion, rhinorrhea and sore throat.   Eyes: Negative for visual disturbance.  Respiratory: Negative for cough and shortness of breath.   Cardiovascular: Negative for chest pain and palpitations.  Gastrointestinal: Negative for abdominal pain, diarrhea and nausea.  Genitourinary: Negative for dysuria.  Musculoskeletal: Negative for arthralgias and myalgias.  Psychiatric/Behavioral: Positive for agitation, confusion, decreased concentration, dysphoric mood and sleep disturbance. The patient is nervous/anxious.     Objective:  BP 124/69   Pulse 65   Temp 97.3 F (36.3 C) (Oral)   Ht  5\' 6"  (1.676 m)   Wt 157 lb (71.2 kg)   BMI 25.34 kg/m   BP Readings from Last 3 Encounters:  12/30/16 124/69  12/16/16 130/78  10/18/16 (!) 142/85    Wt Readings from Last 3 Encounters:  12/30/16 157 lb (71.2 kg)  09/27/16 155 lb (70.3 kg)  07/15/16 151 lb 8 oz (68.7 kg)     Physical Exam  Constitutional: She is oriented to person, place, and time. She appears well-developed and well-nourished. No distress.  HENT:    Head: Normocephalic and atraumatic.  Right Ear: External ear normal.  Left Ear: External ear normal.  Nose: Nose normal.  Mouth/Throat: Oropharynx is clear and moist.  Eyes: Conjunctivae and EOM are normal. Pupils are equal, round, and reactive to light.  Neck: Normal range of motion. Neck supple. No thyromegaly present.  Cardiovascular: Normal rate, regular rhythm and normal heart sounds.   No murmur heard. Pulmonary/Chest: Effort normal and breath sounds normal. No respiratory distress. She has no wheezes. She has no rales.  Abdominal: Soft. Bowel sounds are normal. She exhibits no distension. There is no tenderness.  Lymphadenopathy:    She has no cervical adenopathy.  Neurological: She is alert and oriented to person, place, and time. She has normal reflexes.  Skin: Skin is warm and dry.  Psychiatric: She has a normal mood and affect. Her behavior is normal. Thought content normal.      Assessment & Plan:   Cassandra [name] was seen today for follow-up.  Diagnoses and all orders for this visit:  GAD (generalized anxiety disorder)  Insomnia due to medical condition  Other depression  Other orders -     ALPRAZolam (XANAX) 1 MG tablet; Take one tablet twice daily for Meniere's Disease       I have discontinued Cassandra [name]'s Eluxadoline and fluconazole. I am also having her maintain her lidocaine, conjugated estrogens, fexofenadine, meclizine, hydroxypropyl methylcellulose / hypromellose, chlorhexidine, mupirocin ointment, omeprazole, escitalopram, naproxen, gabapentin, gabapentin, cholecalciferol, methocarbamol, oxyCODONE-acetaminophen, and ALPRAZolam.  Allergies as of 12/30/2016      Reactions   Chantix [varenicline] Rash   Codeine Itching, Nausea And Vomiting   Cymbalta [duloxetine Hcl] Nausea Only   Livalo [pitavastatin] Other (See Comments)   Seizures    Metoprolol Other (See Comments)   Had episode of possible hypotension or seizure described and muscles clamping down and  searing pain toe to head and near fall x2. Loss of bowel control. No further symptoms after stopped.   Oxycodone-aspirin Nausea And Vomiting   Oxycontin [oxycodone Hcl] Other (See Comments)   Altered mental status   Propoxyphene N-acetaminophen Nausea And Vomiting   Trazodone And Nefazodone Other (See Comments)   Medication is too strong   Zithromax [azithromycin] Other (See Comments)   Unknown    Other Rash   Lipturzet caused enlarged veins in legs   Oxycodone-acetaminophen Hives, Nausea And Vomiting, Rash   Septra [sulfamethoxazole-trimethoprim] Rash      Medication List       Accurate as of 12/30/16 11:59 PM. Always use your most recent med list.          ALPRAZolam 1 MG tablet Commonly known as:  XANAX Take one tablet twice daily for Meniere's Disease   chlorhexidine 4 % external liquid Commonly known as:  HIBICLENS Shower daily for 5 days. Apply all over, including scalp and leave on  5 min before rinsing.   cholecalciferol 1000 units tablet Commonly known as:  VITAMIN D Take 1,000 Units by mouth daily.  conjugated estrogens vaginal cream Commonly known as:  PREMARIN Place vaginally daily.   escitalopram 20 MG tablet Commonly known as:  LEXAPRO TAKE 1 TABLET (20 MG TOTAL) BY MOUTH AT BEDTIME.   fexofenadine 180 MG tablet Commonly known as:  ALLEGRA Take 1 tablet (180 mg total) by mouth daily as needed for allergies.   gabapentin 100 MG capsule Commonly known as:  NEURONTIN TAKE 1 CAPSULE (100 MG TOTAL) BY MOUTH 2 (TWO) TIMES DAILY.   gabapentin 300 MG capsule Commonly known as:  NEURONTIN TAKE 1 CAPSULE (300 MG TOTAL) BY MOUTH 3 (THREE) TIMES DAILY.   hydroxypropyl methylcellulose / hypromellose 2.5 % ophthalmic solution Commonly known as:  ISOPTO TEARS / GONIOVISC Place 1 drop into both eyes as needed for dry eyes.   lidocaine 5 % Commonly known as:  LIDODERM Place 1 patch onto the skin as needed. Remove & Discard patch within 12 hours or as directed  by MD   meclizine 25 MG tablet Commonly known as:  ANTIVERT TAKE 1 TABLET (25 MG TOTAL) BY MOUTH 2 (TWO) TIMES DAILY AS NEEDED.   methocarbamol 500 MG tablet Commonly known as:  ROBAXIN Take 1 tablet (500 mg total) by mouth 2 (two) times daily as needed for muscle spasms.   mupirocin ointment 2 % Commonly known as:  BACTROBAN Place 1 application into the nose 2 (two) times daily.   naproxen 500 MG tablet Commonly known as:  NAPROSYN TAKE 1 TABLET (500 MG TOTAL) BY MOUTH 2 (TWO) TIMES DAILY WITH A MEAL.   omeprazole 20 MG capsule Commonly known as:  PRILOSEC TAKE 1 CAPSULE (20 MG TOTAL) BY MOUTH DAILY.   oxyCODONE-acetaminophen 5-325 MG tablet Commonly known as:  PERCOCET Take 1 tablet by mouth every 8 (eight) hours as needed.   zolpidem 5 MG tablet Commonly known as:  AMBIEN TAKE 1 TABLET BY MOUTH AT BEDTIME AS NEEDED FOR SLEEP        Follow-up: Return in about 6 months (around 07/01/2017).  [name], M.D.

## 2016-12-30 NOTE — Telephone Encounter (Signed)
Rx called to pharmacy

## 2017-01-09 ENCOUNTER — Encounter: Payer: Medicare Other | Admitting: *Deleted

## 2017-01-11 ENCOUNTER — Ambulatory Visit (INDEPENDENT_AMBULATORY_CARE_PROVIDER_SITE_OTHER): Payer: Medicare Other | Admitting: Family Medicine

## 2017-01-11 ENCOUNTER — Encounter: Payer: Self-pay | Admitting: Family Medicine

## 2017-01-11 VITALS — BP 133/71 | HR 61 | Temp 97.2°F | Ht 66.0 in | Wt 158.0 lb

## 2017-01-11 DIAGNOSIS — R1033 Periumbilical pain: Secondary | ICD-10-CM

## 2017-01-11 DIAGNOSIS — R1114 Bilious vomiting: Secondary | ICD-10-CM | POA: Diagnosis not present

## 2017-01-11 MED ORDER — LINACLOTIDE 145 MCG PO CAPS: 145.0000 ug | ORAL_CAPSULE | Freq: Every day | ORAL | 5 refills | Status: DC

## 2017-01-11 MED ORDER — LINACLOTIDE 290 MCG PO CAPS: 290.0000 ug | ORAL_CAPSULE | Freq: Every day | ORAL | 5 refills | Status: DC

## 2017-01-11 NOTE — Progress Notes (Signed)
Subjective:  Patient ID: Cassandra [name], female    DOB: Feb 01, 1956  Age: 62 y.o. MRN: [id number]  CC: Emesis (pt here today after having an episode of vomiting yesterday. She says it was acid and bile and due to her acid reflux/GERD. She hasn't had any vomiting this morning.)   HPI Cassandra [name] presents for Intermittent vomiting occurring 2 days at a time several episodes over those 2 days. 3-4 episodes per month ongoing for about 2 years. She says the vomitus is like pure bile. Having watery green color. Normally it occurs after several days of having a bowel movement.  Depression screen [facility] 2/9 01/11/2017 12/30/2016 09/27/2016  Decreased Interest 1 3 3   Down, Depressed, Hopeless 2 2 2   PHQ - 2 Score 3 5 5   Altered sleeping 3 3 2   Tired, decreased energy 3 3 3   Change in appetite 3 3 2   Feeling bad or failure about yourself  1 2 1   Trouble concentrating 1 1 1   Moving slowly or fidgety/restless 2 1 2   Suicidal thoughts 0 0 0  PHQ-9 Score 16 18 16   Difficult doing work/chores - - -  Some recent data might be hidden    History Cassandra [name] has a past medical history of Anxiety; Bowel habit changes; Chronic left shoulder pain; Chronic pain; COPD (chronic obstructive pulmonary disease) ([place]); Depression; GERD (gastroesophageal reflux disease); Hyperlipidemia; Maternal complication related to childbirth; Meniere's disease; Nicotine addiction; Rapid or irregular heartbeat; Skin cancer; Tubular adenoma of colon (01/31/2000); and Weakness of pelvic floor.   She has a past surgical history that includes Bladder repair; Shoulder arthroscopy; Abdominal hysterectomy; Knee arthroscopy (Left); Cesarean section; ruptured uterus; and Anal Rectal manometry (N/A, 12/09/2015).   Her family history includes COPD in her brother; Colon polyps in her sister; Coronary artery disease in her brother, father, and mother; Diabetes in her mother; Heart disease in her sister; Kidney disease in her mother; Liver cancer in her  maternal grandmother; Liver disease in her maternal grandmother; Lung cancer in her paternal grandmother; Lung disease in her father; Polymyalgia rheumatica in her sister; Prostate cancer in her father.She reports that she quit smoking about 19 months ago. Her smoking use included Cigarettes. She has a 42.00 pack-year smoking history. She has never used smokeless tobacco. She reports that she does not drink alcohol or use drugs.    ROS Review of Systems  Constitutional: Negative for activity change, appetite change and fever.  HENT: Negative for congestion, rhinorrhea and sore throat.   Eyes: Negative for visual disturbance.  Respiratory: Negative for cough and shortness of breath.   Cardiovascular: Negative for chest pain and palpitations.  Gastrointestinal: Positive for abdominal pain, constipation, nausea and vomiting. Negative for diarrhea.  Genitourinary: Negative for dysuria.  Musculoskeletal: Negative for arthralgias and myalgias.    Objective:  BP 133/71   Pulse 61   Temp 97.2 F (36.2 C) (Oral)   Ht 5\' 6"  (1.676 m)   Wt 158 lb (71.7 kg)   BMI 25.50 kg/m   BP Readings from Last 3 Encounters:  01/11/17 133/71  12/30/16 124/69  12/16/16 130/78    Wt Readings from Last 3 Encounters:  01/11/17 158 lb (71.7 kg)  12/30/16 157 lb (71.2 kg)  09/27/16 155 lb (70.3 kg)     Physical Exam  Constitutional: She is oriented to person, place, and time. She appears well-developed and well-nourished.  HENT:  Head: Normocephalic and atraumatic.  Cardiovascular: Normal rate and regular rhythm.   No  murmur heard. Pulmonary/Chest: Effort normal and breath sounds normal.  Abdominal: Soft. Bowel sounds are normal. She exhibits no mass. There is tenderness. There is no rebound and no guarding.  Neurological: She is alert and oriented to person, place, and time.  Skin: Skin is warm and dry.  Psychiatric: She has a normal mood and affect. Her behavior is normal.      Assessment &  Plan:   Cassandra [name] was seen today for emesis.  Diagnoses and all orders for this visit:  Periumbilical abdominal pain -     US Abdomen Complete; Future  Bilious vomiting with nausea -     US Abdomen Complete; Future  Other orders -     Discontinue: linaclotide (LINZESS) 145 MCG CAPS capsule; Take 1 capsule (145 mcg total) by mouth daily. To regulate bowel movements -     linaclotide (LINZESS) 290 MCG CAPS capsule; Take 1 capsule (290 mcg total) by mouth daily. To regulate bowel movements       I have changed Cassandra [name]'s linaclotide. I am also having her maintain her lidocaine, conjugated estrogens, fexofenadine, meclizine, hydroxypropyl methylcellulose / hypromellose, chlorhexidine, mupirocin ointment, omeprazole, escitalopram, naproxen, gabapentin, gabapentin, cholecalciferol, methocarbamol, oxyCODONE-acetaminophen, zolpidem, and ALPRAZolam.  Allergies as of 01/11/2017      Reactions   Chantix [varenicline] Rash   Codeine Itching, Nausea And Vomiting   Cymbalta [duloxetine Hcl] Nausea Only   Livalo [pitavastatin] Other (See Comments)   Seizures    Metoprolol Other (See Comments)   Had episode of possible hypotension or seizure described and muscles clamping down and searing pain toe to head and near fall x2. Loss of bowel control. No further symptoms after stopped.   Oxycodone-aspirin Nausea And Vomiting   Oxycontin [oxycodone Hcl] Other (See Comments)   Altered mental status   Propoxyphene N-acetaminophen Nausea And Vomiting   Trazodone And Nefazodone Other (See Comments)   Medication is too strong   Zithromax [azithromycin] Other (See Comments)   Unknown    Other Rash   Lipturzet caused enlarged veins in legs   Oxycodone-acetaminophen Hives, Nausea And Vomiting, Rash   Septra [sulfamethoxazole-trimethoprim] Rash      Medication List       Accurate as of 01/11/17 11:59 PM. Always use your most recent med list.          ALPRAZolam 1 MG tablet Commonly known as:   XANAX Take one tablet twice daily for Meniere's Disease   chlorhexidine 4 % external liquid Commonly known as:  HIBICLENS Shower daily for 5 days. Apply all over, including scalp and leave on  5 min before rinsing.   cholecalciferol 1000 units tablet Commonly known as:  VITAMIN D Take 1,000 Units by mouth daily.   conjugated estrogens vaginal cream Commonly known as:  PREMARIN Place vaginally daily.   escitalopram 20 MG tablet Commonly known as:  LEXAPRO TAKE 1 TABLET (20 MG TOTAL) BY MOUTH AT BEDTIME.   fexofenadine 180 MG tablet Commonly known as:  ALLEGRA Take 1 tablet (180 mg total) by mouth daily as needed for allergies.   gabapentin 100 MG capsule Commonly known as:  NEURONTIN TAKE 1 CAPSULE (100 MG TOTAL) BY MOUTH 2 (TWO) TIMES DAILY.   gabapentin 300 MG capsule Commonly known as:  NEURONTIN TAKE 1 CAPSULE (300 MG TOTAL) BY MOUTH 3 (THREE) TIMES DAILY.   hydroxypropyl methylcellulose / hypromellose 2.5 % ophthalmic solution Commonly known as:  ISOPTO TEARS / GONIOVISC Place 1 drop into both eyes as needed for dry eyes.  lidocaine 5 % Commonly known as:  LIDODERM Place 1 patch onto the skin as needed. Remove & Discard patch within 12 hours or as directed by MD   linaclotide 290 MCG Caps capsule Commonly known as:  LINZESS Take 1 capsule (290 mcg total) by mouth daily. To regulate bowel movements   meclizine 25 MG tablet Commonly known as:  ANTIVERT TAKE 1 TABLET (25 MG TOTAL) BY MOUTH 2 (TWO) TIMES DAILY AS NEEDED.   methocarbamol 500 MG tablet Commonly known as:  ROBAXIN Take 1 tablet (500 mg total) by mouth 2 (two) times daily as needed for muscle spasms.   mupirocin ointment 2 % Commonly known as:  BACTROBAN Place 1 application into the nose 2 (two) times daily.   naproxen 500 MG tablet Commonly known as:  NAPROSYN TAKE 1 TABLET (500 MG TOTAL) BY MOUTH 2 (TWO) TIMES DAILY WITH A MEAL.   omeprazole 20 MG capsule Commonly known as:  PRILOSEC TAKE 1  CAPSULE (20 MG TOTAL) BY MOUTH DAILY.   oxyCODONE-acetaminophen 5-325 MG tablet Commonly known as:  PERCOCET Take 1 tablet by mouth every 8 (eight) hours as needed.   zolpidem 5 MG tablet Commonly known as:  AMBIEN TAKE 1 TABLET BY MOUTH AT BEDTIME AS NEEDED FOR SLEEP        Follow-up: Return in about 2 weeks (around 01/25/2017).  [name], M.D.

## 2017-01-17 ENCOUNTER — Ambulatory Visit (HOSPITAL_COMMUNITY)
Admission: RE | Admit: 2017-01-17 | Discharge: 2017-01-17 | Disposition: A | Discharge status: Home / Self Care | Payer: Medicare Other | Source: Ambulatory Visit | Attending: Family Medicine | Admitting: Family Medicine

## 2017-01-17 DIAGNOSIS — R1033 Periumbilical pain: Secondary | ICD-10-CM | POA: Diagnosis not present

## 2017-01-17 DIAGNOSIS — R1114 Bilious vomiting: Secondary | ICD-10-CM | POA: Insufficient documentation

## 2017-01-17 DIAGNOSIS — R109 Unspecified abdominal pain: Secondary | ICD-10-CM | POA: Diagnosis not present

## 2017-01-18 ENCOUNTER — Telehealth: Payer: Self-pay | Admitting: Family Medicine

## 2017-01-18 NOTE — Telephone Encounter (Signed)
Patient aware and verbalizes understanding of results.

## 2017-01-26 ENCOUNTER — Other Ambulatory Visit: Payer: Self-pay | Admitting: Registered Nurse

## 2017-01-27 ENCOUNTER — Ambulatory Visit (INDEPENDENT_AMBULATORY_CARE_PROVIDER_SITE_OTHER): Payer: Medicare Other | Admitting: Family Medicine

## 2017-01-27 ENCOUNTER — Other Ambulatory Visit: Payer: Self-pay | Admitting: Registered Nurse

## 2017-01-27 ENCOUNTER — Encounter: Payer: Self-pay | Admitting: Family Medicine

## 2017-01-27 ENCOUNTER — Ambulatory Visit (INDEPENDENT_AMBULATORY_CARE_PROVIDER_SITE_OTHER): Payer: Medicare Other

## 2017-01-27 VITALS — BP 134/70 | HR 63 | Temp 97.4°F | Ht 66.0 in | Wt 155.0 lb

## 2017-01-27 DIAGNOSIS — R399 Unspecified symptoms and signs involving the genitourinary system: Secondary | ICD-10-CM

## 2017-01-27 DIAGNOSIS — R109 Unspecified abdominal pain: Secondary | ICD-10-CM | POA: Diagnosis not present

## 2017-01-27 DIAGNOSIS — N12 Tubulo-interstitial nephritis, not specified as acute or chronic: Secondary | ICD-10-CM | POA: Diagnosis not present

## 2017-01-27 DIAGNOSIS — N23 Unspecified renal colic: Secondary | ICD-10-CM

## 2017-01-27 DIAGNOSIS — R1012 Left upper quadrant pain: Secondary | ICD-10-CM

## 2017-01-27 LAB — MICROSCOPIC EXAMINATION
Epithelial Cells (non renal): >10 /HPF — AB (ref 0–10)
RBC, UA: NONE SEEN /HPF (ref 0–?)
Renal Epithel, UA: NONE SEEN /HPF

## 2017-01-27 LAB — URINALYSIS, COMPLETE
Bilirubin, UA: NEGATIVE
Glucose, UA: NEGATIVE
Ketones, UA: NEGATIVE
Leukocytes, UA: NEGATIVE
Nitrite, UA: POSITIVE — AB
Protein, UA: NEGATIVE
RBC, UA: NEGATIVE
Specific Gravity, UA: >1.03 — ABNORMAL HIGH (ref 1.005–1.030)
Urobilinogen, Ur: 0.2 mg/dL (ref 0.2–1.0)
pH, UA: 5 (ref 5.0–7.5)

## 2017-01-27 MED ORDER — ONDANSETRON 8 MG PO TBDP: 8.0000 mg | ORAL_TABLET | Freq: Four times a day (QID) | ORAL | 1 refills | Status: DC | PRN

## 2017-01-27 MED ORDER — CIPROFLOXACIN HCL 500 MG PO TABS: 500.0000 mg | ORAL_TABLET | Freq: Two times a day (BID) | ORAL | 0 refills | Status: DC

## 2017-01-27 NOTE — Progress Notes (Signed)
Subjective:  Patient ID: Cassandra [name], female    DOB: 04-May-1956  Age: 61 y.o. MRN: [id number]  CC: Hematuria (pt here today c/o what she thinks is a UTI because her urine is brownish and cloudy. She also states she recently "passed 4 gritty things" in her urine and was in a lot of pain.)   HPI Cassandra [name] presents for Several days of burning sensation getting more severe in the left upper quadrant. She had been seen several days ago for symptoms and had a ultrasound of the abdomen which was normal. That is attached. Also she's had frequency and dysuria for the last 2 days after passing what she referred to as 4 kidney stones 3 yesterday, and one the day before. Pain has had nausea but she has not had any vomiting or fever.   History Cassandra [name] has a past medical history of Anxiety; Bowel habit changes; Chronic left shoulder pain; Chronic pain; COPD (chronic obstructive pulmonary disease) ([place]); Depression; GERD (gastroesophageal reflux disease); Hyperlipidemia; Maternal complication related to childbirth; Meniere's disease; Nicotine addiction; Rapid or irregular heartbeat; Skin cancer; Tubular adenoma of colon (01/31/2000); and Weakness of pelvic floor.   She has a past surgical history that includes Bladder repair; Shoulder arthroscopy; Abdominal hysterectomy; Knee arthroscopy (Left); Cesarean section; ruptured uterus; and Anal Rectal manometry (N/A, 12/09/2015).   Her family history includes COPD in her brother; Colon polyps in her sister; Coronary artery disease in her brother, father, and mother; Diabetes in her mother; Heart disease in her sister; Kidney disease in her mother; Liver cancer in her maternal grandmother; Liver disease in her maternal grandmother; Lung cancer in her paternal grandmother; Lung disease in her father; Polymyalgia rheumatica in her sister; Prostate cancer in her father.She reports that she quit smoking about 19 months ago. Her smoking use included Cigarettes. She has a  42.00 pack-year smoking history. She has never used smokeless tobacco. She reports that she does not drink alcohol or use drugs.    ROS Review of Systems  Constitutional: Negative for activity change, appetite change and fever.  HENT: Negative for congestion, rhinorrhea and sore throat.   Eyes: Negative for visual disturbance.  Respiratory: Negative for cough and shortness of breath.   Cardiovascular: Negative for chest pain and palpitations.  Gastrointestinal: Positive for abdominal pain. Negative for diarrhea and nausea.  Genitourinary: Negative for dysuria.  Musculoskeletal: Negative for arthralgias and myalgias.    Objective:  BP 134/70   Pulse 63   Temp 97.4 F (36.3 C) (Oral)   Ht 5\' 6"  (1.676 m)   Wt 155 lb (70.3 kg)   BMI 25.02 kg/m   BP Readings from Last 3 Encounters:  01/27/17 134/70  01/11/17 133/71  12/30/16 124/69    Wt Readings from Last 3 Encounters:  01/27/17 155 lb (70.3 kg)  01/11/17 158 lb (71.7 kg)  12/30/16 157 lb (71.2 kg)     Physical Exam  Constitutional: She is oriented to person, place, and time. She appears well-developed and well-nourished.  HENT:  Head: Normocephalic and atraumatic.  Cardiovascular: Normal rate and regular rhythm.   No murmur heard. Pulmonary/Chest: Effort normal and breath sounds normal.  Abdominal: Soft. Bowel sounds are normal. She exhibits no mass. There is tenderness. There is no rebound and no guarding.  Neurological: She is alert and oriented to person, place, and time.  Skin: Skin is warm and dry.  Psychiatric: She has a normal mood and affect. Her behavior is normal.  Assessment & Plan:   Cassandra [name] was seen today for hematuria.  Diagnoses and all orders for this visit:  UTI symptoms -     Urinalysis, Complete -     Urine culture  Acute left flank pain -     DG Abd 2 Views; Future  Acute LUQ pain -     DG Abd 2 Views; Future  Renal colic on left side  Pyelonephritis  Other orders -      ciprofloxacin (CIPRO) 500 MG tablet; Take 1 tablet (500 mg total) by mouth 2 (two) times daily. -     ondansetron (ZOFRAN-ODT) 8 MG disintegrating tablet; Take 1 tablet (8 mg total) by mouth every 6 (six) hours as needed for nausea or vomiting.       I have discontinued Cassandra [name]'s lidocaine. I am also having her start on ciprofloxacin and ondansetron. Additionally, I am having her maintain her conjugated estrogens, fexofenadine, meclizine, hydroxypropyl methylcellulose / hypromellose, chlorhexidine, mupirocin ointment, omeprazole, escitalopram, naproxen, gabapentin, gabapentin, cholecalciferol, methocarbamol, oxyCODONE-acetaminophen, zolpidem, ALPRAZolam, and linaclotide.  Allergies as of 01/27/2017      Reactions   Chantix [varenicline] Rash   Codeine Itching, Nausea And Vomiting   Cymbalta [duloxetine Hcl] Nausea Only   Livalo [pitavastatin] Other (See Comments)   Seizures    Metoprolol Other (See Comments)   Had episode of possible hypotension or seizure described and muscles clamping down and searing pain toe to head and near fall x2. Loss of bowel control. No further symptoms after stopped.   Oxycodone-aspirin Nausea And Vomiting   Oxycontin [oxycodone Hcl] Other (See Comments)   Altered mental status   Propoxyphene N-acetaminophen Nausea And Vomiting   Trazodone And Nefazodone Other (See Comments)   Medication is too strong   Zithromax [azithromycin] Other (See Comments)   Unknown    Other Rash   Lipturzet caused enlarged veins in legs   Oxycodone-acetaminophen Hives, Nausea And Vomiting, Rash   Septra [sulfamethoxazole-trimethoprim] Rash      Medication List       Accurate as of 01/27/17 11:37 AM. Always use your most recent med list.          ALPRAZolam 1 MG tablet Commonly known as:  XANAX Take one tablet twice daily for Meniere's Disease   chlorhexidine 4 % external liquid Commonly known as:  HIBICLENS Shower daily for 5 days. Apply all over, including scalp and  leave on  5 min before rinsing.   cholecalciferol 1000 units tablet Commonly known as:  VITAMIN D Take 1,000 Units by mouth daily.   ciprofloxacin 500 MG tablet Commonly known as:  CIPRO Take 1 tablet (500 mg total) by mouth 2 (two) times daily.   conjugated estrogens vaginal cream Commonly known as:  PREMARIN Place vaginally daily.   escitalopram 20 MG tablet Commonly known as:  LEXAPRO TAKE 1 TABLET (20 MG TOTAL) BY MOUTH AT BEDTIME.   fexofenadine 180 MG tablet Commonly known as:  ALLEGRA Take 1 tablet (180 mg total) by mouth daily as needed for allergies.   gabapentin 100 MG capsule Commonly known as:  NEURONTIN TAKE 1 CAPSULE (100 MG TOTAL) BY MOUTH 2 (TWO) TIMES DAILY.   gabapentin 300 MG capsule Commonly known as:  NEURONTIN TAKE 1 CAPSULE (300 MG TOTAL) BY MOUTH 3 (THREE) TIMES DAILY.   hydroxypropyl methylcellulose / hypromellose 2.5 % ophthalmic solution Commonly known as:  ISOPTO TEARS / GONIOVISC Place 1 drop into both eyes as needed for dry eyes.   linaclotide 290 MCG Caps  capsule Commonly known as:  LINZESS Take 1 capsule (290 mcg total) by mouth daily. To regulate bowel movements   meclizine 25 MG tablet Commonly known as:  ANTIVERT TAKE 1 TABLET (25 MG TOTAL) BY MOUTH 2 (TWO) TIMES DAILY AS NEEDED.   methocarbamol 500 MG tablet Commonly known as:  ROBAXIN Take 1 tablet (500 mg total) by mouth 2 (two) times daily as needed for muscle spasms.   mupirocin ointment 2 % Commonly known as:  BACTROBAN Place 1 application into the nose 2 (two) times daily.   naproxen 500 MG tablet Commonly known as:  NAPROSYN TAKE 1 TABLET (500 MG TOTAL) BY MOUTH 2 (TWO) TIMES DAILY WITH A MEAL.   omeprazole 20 MG capsule Commonly known as:  PRILOSEC TAKE 1 CAPSULE (20 MG TOTAL) BY MOUTH DAILY.   ondansetron 8 MG disintegrating tablet Commonly known as:  ZOFRAN-ODT Take 1 tablet (8 mg total) by mouth every 6 (six) hours as needed for nausea or vomiting.     oxyCODONE-acetaminophen 5-325 MG tablet Commonly known as:  PERCOCET Take 1 tablet by mouth every 8 (eight) hours as needed.   zolpidem 5 MG tablet Commonly known as:  AMBIEN TAKE 1 TABLET BY MOUTH AT BEDTIME AS NEEDED FOR SLEEP        Follow-up: No Follow-up on file.  [name], M.D.

## 2017-01-29 LAB — URINE CULTURE

## 2017-02-03 ENCOUNTER — Ambulatory Visit: Payer: Medicare Other | Admitting: Family Medicine

## 2017-02-10 ENCOUNTER — Encounter: Payer: Medicare Other | Attending: Physical Medicine & Rehabilitation | Admitting: Registered Nurse

## 2017-02-10 ENCOUNTER — Encounter: Payer: Self-pay | Admitting: Registered Nurse

## 2017-02-10 VITALS — BP 148/86 | HR 76

## 2017-02-10 DIAGNOSIS — M25562 Pain in left knee: Secondary | ICD-10-CM

## 2017-02-10 DIAGNOSIS — M7501 Adhesive capsulitis of right shoulder: Secondary | ICD-10-CM | POA: Diagnosis not present

## 2017-02-10 DIAGNOSIS — M5416 Radiculopathy, lumbar region: Secondary | ICD-10-CM

## 2017-02-10 DIAGNOSIS — M25551 Pain in right hip: Secondary | ICD-10-CM

## 2017-02-10 DIAGNOSIS — Z79899 Other long term (current) drug therapy: Secondary | ICD-10-CM | POA: Diagnosis not present

## 2017-02-10 DIAGNOSIS — M791 Myalgia: Secondary | ICD-10-CM | POA: Insufficient documentation

## 2017-02-10 DIAGNOSIS — G8929 Other chronic pain: Secondary | ICD-10-CM | POA: Insufficient documentation

## 2017-02-10 DIAGNOSIS — M7502 Adhesive capsulitis of left shoulder: Secondary | ICD-10-CM | POA: Insufficient documentation

## 2017-02-10 DIAGNOSIS — M7631 Iliotibial band syndrome, right leg: Secondary | ICD-10-CM | POA: Insufficient documentation

## 2017-02-10 DIAGNOSIS — M7918 Myalgia, other site: Secondary | ICD-10-CM

## 2017-02-10 DIAGNOSIS — Z5181 Encounter for therapeutic drug level monitoring: Secondary | ICD-10-CM | POA: Diagnosis not present

## 2017-02-10 DIAGNOSIS — M25512 Pain in left shoulder: Secondary | ICD-10-CM | POA: Insufficient documentation

## 2017-02-10 DIAGNOSIS — G4700 Insomnia, unspecified: Secondary | ICD-10-CM

## 2017-02-10 DIAGNOSIS — M25552 Pain in left hip: Secondary | ICD-10-CM

## 2017-02-10 DIAGNOSIS — M25561 Pain in right knee: Secondary | ICD-10-CM

## 2017-02-10 DIAGNOSIS — M5412 Radiculopathy, cervical region: Secondary | ICD-10-CM

## 2017-02-10 DIAGNOSIS — M542 Cervicalgia: Secondary | ICD-10-CM | POA: Insufficient documentation

## 2017-02-10 DIAGNOSIS — F411 Generalized anxiety disorder: Secondary | ICD-10-CM | POA: Diagnosis present

## 2017-02-10 DIAGNOSIS — G894 Chronic pain syndrome: Secondary | ICD-10-CM | POA: Insufficient documentation

## 2017-02-10 MED ORDER — OXYCODONE-ACETAMINOPHEN 5-325 MG PO TABS: 1.0000 | ORAL_TABLET | Freq: Three times a day (TID) | ORAL | 0 refills | Status: DC | PRN

## 2017-02-10 NOTE — Progress Notes (Signed)
Subjective:    Patient ID: Cassandra [name], female    DOB: 1956/06/12, 61 y.o.   MRN: [id number]  HPI: [name] is a 61year old female who returns for follow up appointment for chronic pain and medication refill. She statesher pain is located in her neck radiating into her bilateral upper extremities with tingling and numbness, also has  left shoulder and left arm pain, and lower back, bilateral hips, left hip and bilateral knees. Sheratesher pain 8. Her current exercise regime is walking.  Cassandra [name] states she has decreased her Xanax 0.5 mg and Lexapro 10 mg for the last few months. She was instructed to informed her PCP, she verbalizes understanding. Also states "she has become more active and doesn't feel like she is in a mental fog anymore".   Last UDS was performed on 10/18/2016, it was consistent. UDS ordered today.    Pain Inventory Average Pain 8 Pain Right Now 8 My pain is sharp, burning, dull, stabbing, tingling and aching  In the last 24 hours, has pain interfered with the following? General activity 6 Relation with others 6 Enjoyment of life 6 What TIME of day is your pain at its worst? all Sleep (in general) Poor  Pain is worse with: bending, sitting, inactivity and some activites Pain improves with: heat/ice, pacing activities, medication and TENS Relief from Meds: 5  Mobility walk without assistance ability to climb steps?  yes do you drive?  yes  Function disabled: date disabled 2007 I need assistance with the following:  dressing and household duties  Neuro/Psych bladder control problems bowel control problems weakness numbness tremor tingling spasms dizziness confusion depression anxiety  Prior Studies Any changes since last visit?  no  Physicians involved in your care Any changes since last visit?  no   Family History  Problem Relation Age of Onset  . Coronary artery disease Mother   . Diabetes Mother        borderline  .  Kidney disease Mother        stage 4  . Coronary artery disease Father   . Prostate cancer Father   . Lung disease Father        black lung  . Liver cancer Maternal Grandmother   . Liver disease Maternal Grandmother   . Polymyalgia rheumatica Sister   . Heart disease Sister   . Coronary artery disease Brother        heart attack  . COPD Brother   . Colon polyps Sister   . Lung cancer Paternal Grandmother   . Colon cancer Unknown        Both sides   Social History   Social History  . Marital status: Married    Spouse name: N/A  . Number of children: 1  . Years of education: N/A   Occupational History  . Homemaker    Social History Main Topics  . Smoking status: Former Smoker    Packs/day: 1.00    Years: 42.00    Types: Cigarettes    Quit date: 06/01/2015  . Smokeless tobacco: Never Used     Comment: would like to try chantix, but has not started on it due to warnings related to depression  . Alcohol use No  . Drug use: No  . Sexual activity: Yes   Other Topics Concern  . None   Social History Narrative  . None   Past Surgical History:  Procedure Laterality Date  . ABDOMINAL HYSTERECTOMY  age 66  . ANAL RECTAL MANOMETRY N/A 12/09/2015   Procedure: ANO RECTAL MANOMETRY;  Surgeon: Mauri Pole, MD;  Location: [facility];  Service: Endoscopy;  Laterality: N/A;  . BLADDER REPAIR     sling/with perforation  . CESAREAN SECTION     1 time  . KNEE ARTHROSCOPY Left   . ruptured uterus    . SHOULDER ARTHROSCOPY     multiple   Past Medical History:  Diagnosis Date  . Anxiety   . Bowel habit changes    excessive gas /frequent BM's with change in shape with pus  . Chronic left shoulder pain    frozen   . Chronic pain    left upper shoulder ,neck and back  . COPD (chronic obstructive pulmonary disease) ([place])    Dr. Redge Gainer  . Depression   . GERD (gastroesophageal reflux disease)   . Hyperlipidemia   . Maternal complication related to  childbirth    Sepsis, staph  . Meniere's disease   . Nicotine addiction    2 packs per day   . Rapid or irregular heartbeat   . Skin cancer    Rt. leg, squamous cell  . Tubular adenoma of colon 01/31/2000   Dr. Richmond Campbell  . Weakness of pelvic floor    prolapse -  MRI from 2017   BP (!) 148/86   Pulse 76   SpO2 97%   Opioid Risk Score:   Fall Risk Score:  `1  Depression screen PHQ 2/9  Depression screen [facility] 2/9 01/27/2017 01/11/2017 12/30/2016 09/27/2016 07/15/2016 06/27/2016 06/07/2016  Decreased Interest 3 1 3 3  0 0 0  Down, Depressed, Hopeless 1 2 2 2  0 0 0  PHQ - 2 Score 4 3 5 5  0 0 0  Altered sleeping 3 3 3 2  - - -  Tired, decreased energy 3 3 3 3  - - -  Change in appetite 3 3 3 2  - - -  Feeling bad or failure about yourself  1 1 2 1  - - -  Trouble concentrating 1 1 1 1  - - -  Moving slowly or fidgety/restless 2 2 1 2  - - -  Suicidal thoughts 0 0 0 0 - - -  PHQ-9 Score 17 16 18 16  - - -  Difficult doing work/chores - - - - - - -  Some recent data might be hidden    Review of Systems  Constitutional: Positive for appetite change, chills, fever and unexpected weight change.  HENT: Negative.   Eyes: Negative.   Respiratory: Positive for shortness of breath.   Cardiovascular: Negative.   Gastrointestinal: Positive for abdominal pain, constipation, diarrhea, nausea and vomiting.  Endocrine: Negative.   Genitourinary: Positive for difficulty urinating and dysuria.  Musculoskeletal: Negative.   Skin: Positive for rash.  Allergic/Immunologic: Negative.   Neurological: Negative.   Hematological: Negative.   Psychiatric/Behavioral: Negative.   All other systems reviewed and are negative.      Objective:   Physical Exam  Constitutional: She is oriented to person, place, and time. She appears well-developed and well-nourished.  HENT:  Head: Normocephalic and atraumatic.  Neck: Normal range of motion. Neck supple.  Cardiovascular: Normal rate and regular rhythm.     Pulmonary/Chest: Effort normal and breath sounds normal.  Musculoskeletal:  Normal Muscle Bulk and Muscle Testing Reveals: Upper Extremities: Right: Full ROM and Muscle Strength 5/5 Left: Decreased ROM 30 Degrees and Muscle Strength 4/5 Left AC Joint Tenderness Thoracic Hypersensitivity: T-1-T-7 Lower  Extremities: Full ROM and Muscle Strength 5/5 Arises from table slowly Narrow Based Gait   Neurological: She is alert and oriented to person, place, and time.  Skin: Skin is warm and dry.  Psychiatric: She has a normal mood and affect.  Nursing note and vitals reviewed.         Assessment & Plan:  1.Post-traumatic arthritis left shoulder s/p 2 shoulder surgeries. Now with adhesive capsulitis left shoulder. 02/10/2017 Refilled:oxyCODONE 5/325mg  one tablet every 8 hours as needed. #70.  We will continue the opioid monitoring program, this consists of regular clinic visits, examinations, urine drug screen, pill counts as well as use of [place] Controlled Substance Reporting System. 2. Cervicalgia, spondylosis: Continue Current Medication Regime.Lidoderm patches, Robaxin, Naprosyn, and Gabapentin. 02/10/2017 3. Depression/insomnia/anxiety: Continue Lexapro. Continue Ambien 5 mg HS #30. Dr. Livia Snellen prescribing Xanax. 02/10/2017  20 minutes of face to face patient care time was spent during this visit. All questions were encouraged and answered.   F/U in 2 months

## 2017-02-18 LAB — TOXASSURE SELECT,+ANTIDEPR,UR

## 2017-02-20 ENCOUNTER — Telehealth: Payer: Self-pay | Admitting: *Deleted

## 2017-02-20 NOTE — Telephone Encounter (Signed)
Urine drug screen for this encounter is consistent for prescribed medication 

## 2017-02-24 ENCOUNTER — Other Ambulatory Visit: Payer: Self-pay | Admitting: Physical Medicine & Rehabilitation

## 2017-02-26 ENCOUNTER — Other Ambulatory Visit: Payer: Self-pay | Admitting: Family Medicine

## 2017-02-28 ENCOUNTER — Encounter: Payer: Self-pay | Admitting: Family Medicine

## 2017-02-28 ENCOUNTER — Ambulatory Visit (INDEPENDENT_AMBULATORY_CARE_PROVIDER_SITE_OTHER): Payer: Medicare Other | Admitting: Family Medicine

## 2017-02-28 VITALS — BP 134/88 | HR 84 | Temp 96.8°F | Ht 66.0 in | Wt 157.0 lb

## 2017-02-28 DIAGNOSIS — R300 Dysuria: Secondary | ICD-10-CM | POA: Diagnosis not present

## 2017-02-28 DIAGNOSIS — N3090 Cystitis, unspecified without hematuria: Secondary | ICD-10-CM | POA: Diagnosis not present

## 2017-02-28 DIAGNOSIS — R1011 Right upper quadrant pain: Secondary | ICD-10-CM

## 2017-02-28 DIAGNOSIS — M461 Sacroiliitis, not elsewhere classified: Secondary | ICD-10-CM | POA: Diagnosis not present

## 2017-02-28 LAB — URINALYSIS, COMPLETE
BILIRUBIN UA: NEGATIVE
GLUCOSE, UA: NEGATIVE
KETONES UA: NEGATIVE
NITRITE UA: POSITIVE — AB
Protein, UA: NEGATIVE
RBC UA: NEGATIVE
Specific Gravity, UA: 1.01 (ref 1.005–1.030)
UUROB: 0.2 mg/dL (ref 0.2–1.0)
pH, UA: 5.5 (ref 5.0–7.5)

## 2017-02-28 LAB — MICROSCOPIC EXAMINATION: Renal Epithel, UA: NONE SEEN /hpf

## 2017-02-28 MED ORDER — NITROFURANTOIN MONOHYD MACRO 100 MG PO CAPS: 100.0000 mg | ORAL_CAPSULE | Freq: Two times a day (BID) | ORAL | 0 refills | Status: DC

## 2017-02-28 NOTE — Progress Notes (Signed)
Subjective:  Patient ID: Cassandra [name], female    DOB: [birth year]  Age: 61 y.o. MRN: [id number]  CC: Thinks she has a kidney stone and vomiting bile   HPI Cassandra [name] presents for Multiple family episodes of bilious vomiting ongoing since she was evaluated last month. No specific foods noted. She continues to have right upper quadrant pain. It has migrated from left upper quadrant. There is also some lower abdominal pain and some dysuria and difficulty urination. She has some low back pain that she is concerned may be her kidney. This is because she's had kidney stones in the past. However, she points to the area of the left SI joint for the point of maximal tenderness/pain and denies pain over the flanks.   History Cassandra [name] has a past medical history of Anxiety; Bowel habit changes; Chronic left shoulder pain; Chronic pain; COPD (chronic obstructive pulmonary disease) ([place]); Depression; GERD (gastroesophageal reflux disease); Hyperlipidemia; Maternal complication related to childbirth; Meniere's disease; Nicotine addiction; Rapid or irregular heartbeat; Skin cancer; Tubular adenoma of colon (01/31/2000); and Weakness of pelvic floor.   She has a past surgical history that includes Bladder repair; Shoulder arthroscopy; Abdominal hysterectomy; Knee arthroscopy (Left); Cesarean section; ruptured uterus; and Anal Rectal manometry (N/A, 12/09/2015).   Her family history includes COPD in her brother; Colon polyps in her sister; Coronary artery disease in her brother, father, and mother; Diabetes in her mother; Heart disease in her sister; Kidney disease in her mother; Liver cancer in her maternal grandmother; Liver disease in her maternal grandmother; Lung cancer in her paternal grandmother; Lung disease in her father; Polymyalgia rheumatica in her sister; Prostate cancer in her father.She reports that she quit smoking about 20 months ago. Her smoking use included Cigarettes. She has a 42.00 pack-year  smoking history. She has never used smokeless tobacco. She reports that she does not drink alcohol or use drugs.    ROS Review of Systems  Constitutional: Positive for appetite change. Negative for activity change and fever.  HENT: Negative for congestion, rhinorrhea and sore throat.   Eyes: Negative for visual disturbance.  Respiratory: Negative for cough and shortness of breath.   Cardiovascular: Negative for chest pain and palpitations.  Gastrointestinal: Positive for nausea and vomiting. Negative for abdominal pain and diarrhea.  Genitourinary: Positive for difficulty urinating and dysuria.  Musculoskeletal: Positive for back pain. Negative for arthralgias and myalgias.    Objective:  BP 134/88   Pulse 84   Temp (!) 96.8 F (36 C) (Oral)   Ht 5\' 6"  (1.676 m)   Wt 157 lb (71.2 kg)   BMI 25.34 kg/m   BP Readings from Last 3 Encounters:  02/28/17 134/88  02/10/17 (!) 148/86  01/27/17 134/70    Wt Readings from Last 3 Encounters:  02/28/17 157 lb (71.2 kg)  01/27/17 155 lb (70.3 kg)  01/11/17 158 lb (71.7 kg)     Physical Exam  Constitutional: She is oriented to person, place, and time. She appears well-developed and well-nourished. No distress.  HENT:  Head: Normocephalic and atraumatic.  Right Ear: External ear normal.  Left Ear: External ear normal.  Nose: Nose normal.  Mouth/Throat: Oropharynx is clear and moist.  Eyes: Conjunctivae and EOM are normal. Pupils are equal, round, and reactive to light.  Neck: Normal range of motion. Neck supple. No thyromegaly present.  Cardiovascular: Normal rate, regular rhythm and normal heart sounds.   No murmur heard. Pulmonary/Chest: Effort normal and breath sounds normal. No respiratory distress.  She has no wheezes. She has no rales.  Abdominal: Soft. Bowel sounds are normal. She exhibits no distension and no mass. There is tenderness (oderate right upper quadrant but no Murphy sign). There is no rebound and no guarding.    Lymphadenopathy:    She has no cervical adenopathy.  Neurological: She is alert and oriented to person, place, and time. She has normal reflexes.  Skin: Skin is warm and dry.  Psychiatric: She has a normal mood and affect. Her behavior is normal. Judgment and thought content normal.      Assessment & Plan:   Cassandra [name] was seen today for thinks she has a kidney stone and vomiting bile.  Diagnoses and all orders for this visit:  Dysuria -     Urinalysis, Complete -     NM Hepato W/EjeCT Fract; Future -     Urine Culture  Right upper quadrant abdominal pain  Cystitis  Sacroiliitis (HCC)  Other orders -     nitrofurantoin, macrocrystal-monohydrate, (MACROBID) 100 MG capsule; Take 1 capsule (100 mg total) by mouth 2 (two) times daily. -     Microscopic Examination       I am having Cassandra [name] start on nitrofurantoin (macrocrystal-monohydrate). I am also having her maintain her conjugated estrogens, fexofenadine, meclizine, hydroxypropyl methylcellulose / hypromellose, chlorhexidine, mupirocin ointment, omeprazole, naproxen, gabapentin, gabapentin, cholecalciferol, zolpidem, fluconazole, ALPRAZolam, linaclotide, ondansetron, oxyCODONE-acetaminophen, methocarbamol, and escitalopram.  Allergies as of 02/28/2017      Reactions   Chantix [varenicline] Rash   Codeine Itching, Nausea And Vomiting   Cymbalta [duloxetine Hcl] Nausea Only   Livalo [pitavastatin] Other (See Comments)   Seizures    Metoprolol Other (See Comments)   Had episode of possible hypotension or seizure described and muscles clamping down and searing pain toe to head and near fall x2. Loss of bowel control. No further symptoms after stopped.   Oxycodone-aspirin Nausea And Vomiting   Oxycontin [oxycodone Hcl] Other (See Comments)   Altered mental status   Propoxyphene N-acetaminophen Nausea And Vomiting   Trazodone And Nefazodone Other (See Comments)   Medication is too strong   Zithromax [azithromycin] Other  (See Comments)   Unknown    Other Rash   Lipturzet caused enlarged veins in legs   Oxycodone-acetaminophen Hives, Nausea And Vomiting, Rash   Septra [sulfamethoxazole-trimethoprim] Rash      Medication List       Accurate as of 02/28/17  9:29 PM. Always use your most recent med list.          ALPRAZolam 1 MG tablet Commonly known as:  XANAX Take one tablet twice daily for Meniere's Disease   chlorhexidine 4 % external liquid Commonly known as:  HIBICLENS Shower daily for 5 days. Apply all over, including scalp and leave on  5 min before rinsing.   cholecalciferol 1000 units tablet Commonly known as:  VITAMIN D Take 1,000 Units by mouth daily.   conjugated estrogens vaginal cream Commonly known as:  PREMARIN Place vaginally daily.   escitalopram 20 MG tablet Commonly known as:  LEXAPRO TAKE 1 TABLET (20 MG TOTAL) BY MOUTH AT BEDTIME.   fexofenadine 180 MG tablet Commonly known as:  ALLEGRA Take 1 tablet (180 mg total) by mouth daily as needed for allergies.   fluconazole 100 MG tablet Commonly known as:  DIFLUCAN TAKE 1 TABLET (100 MG TOTAL) BY MOUTH DAILY.   gabapentin 100 MG capsule Commonly known as:  NEURONTIN TAKE 1 CAPSULE (100 MG TOTAL) BY MOUTH 2 (TWO)  TIMES DAILY.   gabapentin 300 MG capsule Commonly known as:  NEURONTIN TAKE 1 CAPSULE (300 MG TOTAL) BY MOUTH 3 (THREE) TIMES DAILY.   hydroxypropyl methylcellulose / hypromellose 2.5 % ophthalmic solution Commonly known as:  ISOPTO TEARS / GONIOVISC Place 1 drop into both eyes as needed for dry eyes.   linaclotide 290 MCG Caps capsule Commonly known as:  LINZESS Take 1 capsule (290 mcg total) by mouth daily. To regulate bowel movements   meclizine 25 MG tablet Commonly known as:  ANTIVERT TAKE 1 TABLET (25 MG TOTAL) BY MOUTH 2 (TWO) TIMES DAILY AS NEEDED.   methocarbamol 500 MG tablet Commonly known as:  ROBAXIN TAKE 1 TABLET BY MOUTH TWICE A DAY AS NEEDED FOR MUSCLE SPASMS   mupirocin ointment  2 % Commonly known as:  BACTROBAN Place 1 application into the nose 2 (two) times daily.   naproxen 500 MG tablet Commonly known as:  NAPROSYN TAKE 1 TABLET (500 MG TOTAL) BY MOUTH 2 (TWO) TIMES DAILY WITH A MEAL.   nitrofurantoin (macrocrystal-monohydrate) 100 MG capsule Commonly known as:  MACROBID Take 1 capsule (100 mg total) by mouth 2 (two) times daily.   omeprazole 20 MG capsule Commonly known as:  PRILOSEC TAKE 1 CAPSULE (20 MG TOTAL) BY MOUTH DAILY.   ondansetron 8 MG disintegrating tablet Commonly known as:  ZOFRAN-ODT Take 1 tablet (8 mg total) by mouth every 6 (six) hours as needed for nausea or vomiting.   oxyCODONE-acetaminophen 5-325 MG tablet Commonly known as:  PERCOCET Take 1 tablet by mouth every 8 (eight) hours as needed.   zolpidem 5 MG tablet Commonly known as:  AMBIEN TAKE 1 TABLET BY MOUTH AT BEDTIME AS NEEDED FOR SLEEP        Follow-up: No Follow-up on file.  [name], M.D.

## 2017-03-05 LAB — URINE CULTURE

## 2017-03-06 ENCOUNTER — Encounter (HOSPITAL_COMMUNITY)
Admission: RE | Admit: 2017-03-06 | Discharge: 2017-03-06 | Disposition: A | Discharge status: Home / Self Care | Payer: Medicare Other | Source: Ambulatory Visit | Attending: Family Medicine | Admitting: Family Medicine

## 2017-03-06 ENCOUNTER — Encounter (HOSPITAL_COMMUNITY): Payer: Self-pay

## 2017-03-06 DIAGNOSIS — R300 Dysuria: Secondary | ICD-10-CM | POA: Insufficient documentation

## 2017-03-06 DIAGNOSIS — R1114 Bilious vomiting: Secondary | ICD-10-CM | POA: Diagnosis not present

## 2017-03-06 DIAGNOSIS — R1011 Right upper quadrant pain: Secondary | ICD-10-CM | POA: Diagnosis not present

## 2017-03-06 MED ORDER — TECHNETIUM TC 99M MEBROFENIN IV KIT
5.0000 | PACK | Freq: Once | INTRAVENOUS | Status: AC | PRN
  Administered 2017-03-06: 5.4 via INTRAVENOUS

## 2017-03-14 ENCOUNTER — Encounter: Payer: Self-pay | Admitting: Registered Nurse

## 2017-03-14 ENCOUNTER — Ambulatory Visit
Admission: RE | Admit: 2017-03-14 | Discharge: 2017-03-14 | Disposition: A | Discharge status: Home / Self Care | Payer: Medicare Other | Source: Ambulatory Visit | Attending: Registered Nurse | Admitting: Registered Nurse

## 2017-03-14 ENCOUNTER — Encounter: Payer: Medicare Other | Attending: Physical Medicine & Rehabilitation | Admitting: Registered Nurse

## 2017-03-14 ENCOUNTER — Telehealth: Payer: Self-pay | Admitting: Registered Nurse

## 2017-03-14 VITALS — BP 133/92 | HR 78 | Resp 14

## 2017-03-14 DIAGNOSIS — M7501 Adhesive capsulitis of right shoulder: Secondary | ICD-10-CM | POA: Diagnosis not present

## 2017-03-14 DIAGNOSIS — M25512 Pain in left shoulder: Secondary | ICD-10-CM | POA: Diagnosis not present

## 2017-03-14 DIAGNOSIS — Z5181 Encounter for therapeutic drug level monitoring: Secondary | ICD-10-CM | POA: Diagnosis not present

## 2017-03-14 DIAGNOSIS — G894 Chronic pain syndrome: Secondary | ICD-10-CM

## 2017-03-14 DIAGNOSIS — M7631 Iliotibial band syndrome, right leg: Secondary | ICD-10-CM | POA: Insufficient documentation

## 2017-03-14 DIAGNOSIS — M7918 Myalgia, other site: Secondary | ICD-10-CM

## 2017-03-14 DIAGNOSIS — M542 Cervicalgia: Secondary | ICD-10-CM | POA: Diagnosis not present

## 2017-03-14 DIAGNOSIS — Z79899 Other long term (current) drug therapy: Secondary | ICD-10-CM | POA: Diagnosis not present

## 2017-03-14 DIAGNOSIS — M791 Myalgia: Secondary | ICD-10-CM | POA: Insufficient documentation

## 2017-03-14 DIAGNOSIS — M5412 Radiculopathy, cervical region: Secondary | ICD-10-CM | POA: Diagnosis not present

## 2017-03-14 DIAGNOSIS — G8929 Other chronic pain: Secondary | ICD-10-CM

## 2017-03-14 DIAGNOSIS — M7502 Adhesive capsulitis of left shoulder: Secondary | ICD-10-CM | POA: Diagnosis not present

## 2017-03-14 DIAGNOSIS — F411 Generalized anxiety disorder: Secondary | ICD-10-CM | POA: Diagnosis present

## 2017-03-14 DIAGNOSIS — M545 Low back pain, unspecified: Secondary | ICD-10-CM

## 2017-03-14 DIAGNOSIS — M48061 Spinal stenosis, lumbar region without neurogenic claudication: Secondary | ICD-10-CM | POA: Diagnosis not present

## 2017-03-14 MED ORDER — OXYCODONE-ACETAMINOPHEN 5-325 MG PO TABS: 1.0000 | ORAL_TABLET | Freq: Three times a day (TID) | ORAL | 0 refills | Status: DC | PRN

## 2017-03-14 NOTE — Progress Notes (Signed)
Subjective:    Patient ID: Cassandra [name], female    DOB: [birth year], 61 y.o.   MRN: [id number]  HPI: [name] is a 61year old female who returns for follow up appointmentfor chronic pain and medication refill. She statesher pain is located in her left shoulder radiating into her left arm  And left hand with tingling and numbness andlower back. Sheratesher pain 7. Her current exercise regime is walking.  [name] states her mental alertness has improved with the decreased of  Xanax 0.5 mg BID and Ambien 2.5 mg. Would like to decrease her Gabapentin, instructed to continue with Gabapentin 300 mg and discontinue the 100 mg dose over the next 2 weeks, instructed to call office with any questions and concerns. She verbalizes understanding. [name] states she fell 4-5 months ago and states she has been experiencing increase intensity of lower back pain, will order Lumbar X-ray today, she verbalizes understanding.    Also admits she had a glass of wine last month while out with her friends and experienced an allergic reaction she sates, she didn't seek medical attention. Reviewed the narcotic policy she verbalizes understanding. .   Last UDS was performed on 10/18/2016, it was consistent. UDS ordered today.    Pain Inventory Average Pain 7 Pain Right Now 7 My pain is constant, sharp, burning, dull, stabbing, tingling and aching  In the last 24 hours, has pain interfered with the following? General activity 6 Relation with others 7 Enjoyment of life 7 What TIME of day is your pain at its worst? all Sleep (in general) Poor  Pain is worse with: bending, sitting, inactivity, standing and some activites Pain improves with: rest, heat/ice, pacing activities, medication and TENS Relief from Meds: 6  Mobility walk without assistance ability to climb steps?  yes do you drive?  yes needs help with transfers transfers alone  Function not employed: date last employed  2007 disabled: date disabled 2008 I need assistance with the following:  dressing, household duties and shopping  Neuro/Psych bladder control problems bowel control problems weakness numbness tingling spasms dizziness confusion depression anxiety  Prior Studies Any changes since last visit?  no  Physicians involved in your care Any changes since last visit?  no   Family History  Problem Relation Age of Onset  . Coronary artery disease Mother   . Diabetes Mother        borderline  . Kidney disease Mother        stage 4  . Coronary artery disease Father   . Prostate cancer Father   . Lung disease Father        black lung  . Liver cancer Maternal Grandmother   . Liver disease Maternal Grandmother   . Polymyalgia rheumatica Sister   . Heart disease Sister   . Coronary artery disease Brother        heart attack  . COPD Brother   . Colon polyps Sister   . Lung cancer Paternal Grandmother   . Colon cancer Unknown        Both sides   Social History   Social History  . Marital status: Married    Spouse name: N/A  . Number of children: 1  . Years of education: N/A   Occupational History  . Homemaker    Social History Main Topics  . Smoking status: Former Smoker    Packs/day: 1.00    Years: 42.00    Types: Cigarettes  Quit date: 06/01/2015  . Smokeless tobacco: Never Used     Comment: would like to try chantix, but has not started on it due to warnings related to depression  . Alcohol use No  . Drug use: No  . Sexual activity: Yes   Other Topics Concern  . Not on file   Social History Narrative  . No narrative on file   Past Surgical History:  Procedure Laterality Date  . ABDOMINAL HYSTERECTOMY     age 38  . ANAL RECTAL MANOMETRY N/A 12/09/2015   Procedure: ANO RECTAL MANOMETRY;  Surgeon: Mauri Pole, MD;  Location: [facility];  Service: Endoscopy;  Laterality: N/A;  . BLADDER REPAIR     sling/with perforation  . CESAREAN SECTION     1  time  . KNEE ARTHROSCOPY Left   . ruptured uterus    . SHOULDER ARTHROSCOPY     multiple   Past Medical History:  Diagnosis Date  . Anxiety   . Bowel habit changes    excessive gas /frequent BM's with change in shape with pus  . Chronic left shoulder pain    frozen   . Chronic pain    left upper shoulder ,neck and back  . COPD (chronic obstructive pulmonary disease) ([place])    Dr. Redge Gainer  . Depression   . GERD (gastroesophageal reflux disease)   . Hyperlipidemia   . Maternal complication related to childbirth    Sepsis, staph  . Meniere's disease   . Nicotine addiction    2 packs per day   . Rapid or irregular heartbeat   . Skin cancer    Rt. leg, squamous cell  . Tubular adenoma of colon 01/31/2000   Dr. Richmond Campbell  . Weakness of pelvic floor    prolapse -  MRI from 2017   There were no vitals taken for this visit.  Opioid Risk Score:   Fall Risk Score:  `1  Depression screen PHQ 2/9  Depression screen [facility] 2/9 02/28/2017 01/27/2017 01/11/2017 12/30/2016 09/27/2016 07/15/2016 06/27/2016  Decreased Interest 0 3 1 3 3  0 0  Down, Depressed, Hopeless 0 1 2 2 2  0 0  PHQ - 2 Score 0 4 3 5 5  0 0  Altered sleeping - 3 3 3 2  - -  Tired, decreased energy - 3 3 3 3  - -  Change in appetite - 3 3 3 2  - -  Feeling bad or failure about yourself  - 1 1 2 1  - -  Trouble concentrating - 1 1 1 1  - -  Moving slowly or fidgety/restless - 2 2 1 2  - -  Suicidal thoughts - 0 0 0 0 - -  PHQ-9 Score - 17 16 18 16  - -  Difficult doing work/chores - - - - - - -  Some recent data might be hidden    Review of Systems  Constitutional: Positive for appetite change, chills, fever and unexpected weight change.  HENT: Negative.   Eyes: Negative.   Respiratory: Negative.   Cardiovascular: Negative.   Gastrointestinal: Positive for abdominal pain, constipation, diarrhea, nausea and vomiting.  Endocrine:       High blood sugar  Genitourinary: Positive for difficulty urinating and dysuria.   Musculoskeletal: Negative.   Skin: Negative.   Allergic/Immunologic: Negative.   Neurological: Negative.   Hematological: Bruises/bleeds easily.  Psychiatric/Behavioral: Negative.   All other systems reviewed and are negative.      Objective:   Physical Exam  Constitutional: She is oriented to person, place, and time. She appears well-developed and well-nourished.  HENT:  Head: Normocephalic and atraumatic.  Neck: Normal range of motion. Neck supple.  Cardiovascular: Normal rate and regular rhythm.   Pulmonary/Chest: Effort normal and breath sounds normal.  Musculoskeletal:  Normal Muscle Bulk and Muscle testing Reveals: Upper Extremities: Right: Full ROM and Muscle Strength 5/5 Left: Decreased ROM 30 Degrees and Muscle Strength 4/5 Left AC Joint Tenderness Left Thoracic Hypersensitivity T-1-T-7 Lumbar Paraspinal Tenderness: L-3-L-5 Lower Extremities: Full ROM and Muscle Strength 5/5 Arises from chair with ease Narrow Based gait  Neurological: She is alert and oriented to person, place, and time.  Skin: Skin is warm and dry.  Psychiatric: She has a normal mood and affect.  Nursing note and vitals reviewed.         Assessment & Plan:  1.Post-traumatic arthritis left shoulder s/p 2 shoulder surgeries. Now with adhesive capsulitis left shoulder. 03/14/2017 Refilled:oxyCODONE 5/325mg  one tablet every 8 hours as needed. #70.  We will continue the opioid monitoring program, this consists of regular clinic visits, examinations, urine drug screen, pill counts as well as use of [place] Controlled Substance Reporting System. 2. Cervicalgia, spondylosis: Continue Current Medication Regime.Lidoderm patches, Robaxin, Naprosyn, and Gabapentin. 03/14/2017 3. Depression/insomnia/anxiety: Continue Lexapro. Continue Ambien 5 mg HS #30. Dr. Livia Snellen prescribing Xanax. 03/14/2017  20 minutes of face to face patient care time was spent during this visit. All questions were encouraged  and answered.   F/U in 2 months

## 2017-03-14 NOTE — Telephone Encounter (Signed)
On 03/14/2017 the  [place] was reviewed no conflict was seen on the [place] with multiple prescribers. Cassandra [name] has a signed narcotic contract with our office. If there were any discrepancies this would have been reported to her physician.

## 2017-03-17 ENCOUNTER — Telehealth: Payer: Self-pay | Admitting: *Deleted

## 2017-03-17 NOTE — Telephone Encounter (Signed)
IMPRESSION: Mild degenerative narrowing of the L4-5 disc. Mild facet joint hypertrophy at L4-5 and L5-S1. No compression fracture or spondylolisthesis

## 2017-03-17 NOTE — Telephone Encounter (Signed)
Patient left a message asking for the results of her x-ray that eunice ordered at last visit.  Danella Sensing, ANP will not be back in office until Thursday, July 12th, 2018. Please advise on what to tell patient

## 2017-03-20 NOTE — Telephone Encounter (Signed)
I notified Stephaie.

## 2017-03-26 ENCOUNTER — Other Ambulatory Visit: Payer: Self-pay | Admitting: Family Medicine

## 2017-04-06 ENCOUNTER — Other Ambulatory Visit: Payer: Self-pay | Admitting: Physical Medicine & Rehabilitation

## 2017-04-14 ENCOUNTER — Encounter: Payer: Self-pay | Admitting: Registered Nurse

## 2017-04-14 ENCOUNTER — Encounter: Payer: Medicare Other | Attending: Physical Medicine & Rehabilitation | Admitting: Registered Nurse

## 2017-04-14 VITALS — BP 123/74 | HR 65

## 2017-04-14 DIAGNOSIS — G8929 Other chronic pain: Secondary | ICD-10-CM | POA: Diagnosis not present

## 2017-04-14 DIAGNOSIS — G894 Chronic pain syndrome: Secondary | ICD-10-CM

## 2017-04-14 DIAGNOSIS — M25512 Pain in left shoulder: Secondary | ICD-10-CM | POA: Insufficient documentation

## 2017-04-14 DIAGNOSIS — M7631 Iliotibial band syndrome, right leg: Secondary | ICD-10-CM | POA: Insufficient documentation

## 2017-04-14 DIAGNOSIS — G4700 Insomnia, unspecified: Secondary | ICD-10-CM

## 2017-04-14 DIAGNOSIS — Z5181 Encounter for therapeutic drug level monitoring: Secondary | ICD-10-CM | POA: Diagnosis not present

## 2017-04-14 DIAGNOSIS — M542 Cervicalgia: Secondary | ICD-10-CM | POA: Diagnosis not present

## 2017-04-14 DIAGNOSIS — M791 Myalgia: Secondary | ICD-10-CM

## 2017-04-14 DIAGNOSIS — M7501 Adhesive capsulitis of right shoulder: Secondary | ICD-10-CM | POA: Diagnosis not present

## 2017-04-14 DIAGNOSIS — F411 Generalized anxiety disorder: Secondary | ICD-10-CM | POA: Diagnosis present

## 2017-04-14 DIAGNOSIS — M7502 Adhesive capsulitis of left shoulder: Secondary | ICD-10-CM | POA: Diagnosis not present

## 2017-04-14 DIAGNOSIS — Z79899 Other long term (current) drug therapy: Secondary | ICD-10-CM | POA: Diagnosis not present

## 2017-04-14 DIAGNOSIS — M545 Low back pain, unspecified: Secondary | ICD-10-CM

## 2017-04-14 DIAGNOSIS — M25561 Pain in right knee: Secondary | ICD-10-CM

## 2017-04-14 DIAGNOSIS — M5412 Radiculopathy, cervical region: Secondary | ICD-10-CM

## 2017-04-14 DIAGNOSIS — M7918 Myalgia, other site: Secondary | ICD-10-CM

## 2017-04-14 MED ORDER — METHOCARBAMOL 500 MG PO TABS: ORAL_TABLET | ORAL | 1 refills | Status: DC

## 2017-04-14 MED ORDER — OXYCODONE-ACETAMINOPHEN 5-325 MG PO TABS: 1.0000 | ORAL_TABLET | Freq: Three times a day (TID) | ORAL | 0 refills | Status: DC | PRN

## 2017-04-14 NOTE — Progress Notes (Signed)
Subjective:    Patient ID: Cassandra [name], female    DOB: 1956-07-16, 61 y.o.   MRN: [id number]  HPI:  Cassandra [name] is a 61year old female who returns for follow up appointmentfor chronic pain and medication refill. She statesher pain is located in her neck radiating into her left shoulder and  left arm with tingling and numbness and lowerback pain.Sheratesher pain 7. Her current exercise regime is walking.   Last UDS was performed on 02/10/2017, it was consistent.   Pain Inventory Average Pain 7 Pain Right Now 7 My pain is constant, sharp, burning, dull, stabbing, tingling and aching  In the last 24 hours, has pain interfered with the following? General activity 7 Relation with others 7 Enjoyment of life 7 What TIME of day is your pain at its worst? evening Sleep (in general) Poor  Pain is worse with: bending, sitting, inactivity and some activites Pain improves with: rest, heat/ice, pacing activities and medication Relief from Meds: 5  Mobility walk without assistance ability to climb steps?  yes do you drive?  yes  Function not employed: date last employed . disabled: date disabled .  Neuro/Psych bladder control problems bowel control problems weakness numbness tremor tingling trouble walking spasms dizziness confusion depression anxiety  Prior Studies Any changes since last visit?  no  Physicians involved in your care Any changes since last visit?  no   Family History  Problem Relation Age of Onset  . Coronary artery disease Mother   . Diabetes Mother        borderline  . Kidney disease Mother        stage 4  . Coronary artery disease Father   . Prostate cancer Father   . Lung disease Father        black lung  . Liver cancer Maternal Grandmother   . Liver disease Maternal Grandmother   . Polymyalgia rheumatica Sister   . Heart disease Sister   . Coronary artery disease Brother        heart attack  . COPD Brother   . Colon  polyps Sister   . Lung cancer Paternal Grandmother   . Colon cancer Unknown        Both sides   Social History   Social History  . Marital status: Married    Spouse name: N/A  . Number of children: 1  . Years of education: N/A   Occupational History  . Homemaker    Social History Main Topics  . Smoking status: Former Smoker    Packs/day: 1.00    Years: 42.00    Types: Cigarettes    Quit date: 06/01/2015  . Smokeless tobacco: Never Used     Comment: would like to try chantix, but has not started on it due to warnings related to depression  . Alcohol use No  . Drug use: No  . Sexual activity: Yes   Other Topics Concern  . Not on file   Social History Narrative  . No narrative on file   Past Surgical History:  Procedure Laterality Date  . ABDOMINAL HYSTERECTOMY     age 61  . ANAL RECTAL MANOMETRY N/A 12/09/2015   Procedure: ANO RECTAL MANOMETRY;  Surgeon: Mauri Pole, MD;  Location: [facility];  Service: Endoscopy;  Laterality: N/A;  . BLADDER REPAIR     sling/with perforation  . CESAREAN SECTION     1 time  . KNEE ARTHROSCOPY Left   . ruptured uterus    .  SHOULDER ARTHROSCOPY     multiple   Past Medical History:  Diagnosis Date  . Anxiety   . Bowel habit changes    excessive gas /frequent BM's with change in shape with pus  . Chronic left shoulder pain    frozen   . Chronic pain    left upper shoulder ,neck and back  . COPD (chronic obstructive pulmonary disease) ([place])    Dr. Redge Gainer  . Depression   . GERD (gastroesophageal reflux disease)   . Hyperlipidemia   . Maternal complication related to childbirth    Sepsis, staph  . Meniere's disease   . Nicotine addiction    2 packs per day   . Rapid or irregular heartbeat   . Skin cancer    Rt. leg, squamous cell  . Tubular adenoma of colon 01/31/2000   Dr. Richmond Campbell  . Weakness of pelvic floor    prolapse -  MRI from 2017   There were no vitals taken for this visit.  Opioid Risk  Score:   Fall Risk Score:  `1  Depression screen PHQ 2/9  Depression screen [facility] 2/9 02/28/2017 01/27/2017 01/11/2017 12/30/2016 09/27/2016 07/15/2016 06/27/2016  Decreased Interest 0 3 1 3 3  0 0  Down, Depressed, Hopeless 0 1 2 2 2  0 0  PHQ - 2 Score 0 4 3 5 5  0 0  Altered sleeping - 3 3 3 2  - -  Tired, decreased energy - 3 3 3 3  - -  Change in appetite - 3 3 3 2  - -  Feeling bad or failure about yourself  - 1 1 2 1  - -  Trouble concentrating - 1 1 1 1  - -  Moving slowly or fidgety/restless - 2 2 1 2  - -  Suicidal thoughts - 0 0 0 0 - -  PHQ-9 Score - 17 16 18 16  - -  Difficult doing work/chores - - - - - - -  Some recent data might be hidden     Review of Systems  Constitutional: Positive for appetite change, diaphoresis and unexpected weight change.  HENT: Negative.   Eyes: Negative.   Respiratory: Negative.   Cardiovascular: Negative.   Gastrointestinal: Positive for abdominal pain, constipation, diarrhea, nausea and vomiting.  Endocrine: Negative.   Genitourinary: Positive for difficulty urinating and dysuria.  Musculoskeletal: Negative.   Skin: Positive for rash.  Allergic/Immunologic: Negative.   Neurological: Negative.   Hematological: Negative.   Psychiatric/Behavioral: Negative.   All other systems reviewed and are negative.      Objective:   Physical Exam  Constitutional: She is oriented to person, place, and time. She appears well-developed and well-nourished.  HENT:  Head: Normocephalic and atraumatic.  Neck: Normal range of motion. Neck supple.  Cervical Paraspinal Tenderness: C-5-C-6  Cardiovascular: Normal rate and regular rhythm.   Pulmonary/Chest: Effort normal and breath sounds normal.  Musculoskeletal:  Normal Muscle Bulk and Muscle Testing Reveals: Upper Extremities: Right: Full ROM and Muscle Strength 5/5 Left AC Joint Tenderness Thoracic Hypersensitivity: T-1-T-7, Mainly Left Side Lumbar Paraspinal Tenderness: L-3-L-5 Lower Extremities: Full ROM  and Muscle Strength 5/5 Right Lower Extremity Flexion Produces Pain into Patella Arises from Table with Ease Narrow Based Gait   Neurological: She is alert and oriented to person, place, and time.  Skin: Skin is warm and dry.  Psychiatric: She has a normal mood and affect.  Nursing note and vitals reviewed.         Assessment & Plan:  1.Post-traumatic  arthritis left shoulder s/p 2 shoulder surgeries. Now with adhesive capsulitis left shoulder. 04/14/2017 Refilled:oxyCODONE 5/325mg  one tablet every 8 hours as needed. #70.  We will continue the opioid monitoring program, this consists of regular clinic visits, examinations, urine drug screen, pill counts as well as use of [place] Controlled Substance Reporting System. 2. Cervicalgia, spondylosis/ Cervical Radiculitis: Continue Current Medication Regime.Lidoderm patches, Robaxin, Naprosyn, and Gabapentin. 04/14/2017 3. Depression/insomnia/anxiety: Continue Lexapro. Continue Ambien 5 mg HS #30. Dr. Livia Snellen prescribing Xanax. 04/14/2017  20  minutes of face to face patient care time was spent during this visit. All questions were encouraged and answered.   F/U in 1 month

## 2017-04-21 ENCOUNTER — Other Ambulatory Visit: Payer: Self-pay | Admitting: *Deleted

## 2017-04-21 MED ORDER — OMEPRAZOLE 20 MG PO CPDR: DELAYED_RELEASE_CAPSULE | ORAL | 0 refills | Status: DC

## 2017-05-16 ENCOUNTER — Encounter: Payer: Self-pay | Admitting: Registered Nurse

## 2017-05-16 ENCOUNTER — Encounter: Payer: Medicare Other | Attending: Physical Medicine & Rehabilitation | Admitting: Registered Nurse

## 2017-05-16 ENCOUNTER — Telehealth: Payer: Self-pay | Admitting: *Deleted

## 2017-05-16 ENCOUNTER — Ambulatory Visit
Admission: RE | Admit: 2017-05-16 | Discharge: 2017-05-16 | Disposition: A | Discharge status: Home / Self Care | Payer: Medicare Other | Source: Ambulatory Visit | Attending: Registered Nurse | Admitting: Registered Nurse

## 2017-05-16 VITALS — BP 138/81 | HR 71

## 2017-05-16 DIAGNOSIS — G8929 Other chronic pain: Secondary | ICD-10-CM | POA: Insufficient documentation

## 2017-05-16 DIAGNOSIS — M7502 Adhesive capsulitis of left shoulder: Secondary | ICD-10-CM | POA: Insufficient documentation

## 2017-05-16 DIAGNOSIS — M791 Myalgia: Secondary | ICD-10-CM | POA: Diagnosis not present

## 2017-05-16 DIAGNOSIS — G894 Chronic pain syndrome: Secondary | ICD-10-CM | POA: Insufficient documentation

## 2017-05-16 DIAGNOSIS — F411 Generalized anxiety disorder: Secondary | ICD-10-CM | POA: Diagnosis present

## 2017-05-16 DIAGNOSIS — M542 Cervicalgia: Secondary | ICD-10-CM | POA: Insufficient documentation

## 2017-05-16 DIAGNOSIS — M25562 Pain in left knee: Secondary | ICD-10-CM | POA: Diagnosis not present

## 2017-05-16 DIAGNOSIS — M7631 Iliotibial band syndrome, right leg: Secondary | ICD-10-CM | POA: Diagnosis not present

## 2017-05-16 DIAGNOSIS — M5412 Radiculopathy, cervical region: Secondary | ICD-10-CM

## 2017-05-16 DIAGNOSIS — Z5181 Encounter for therapeutic drug level monitoring: Secondary | ICD-10-CM | POA: Insufficient documentation

## 2017-05-16 DIAGNOSIS — M545 Low back pain, unspecified: Secondary | ICD-10-CM

## 2017-05-16 DIAGNOSIS — M7501 Adhesive capsulitis of right shoulder: Secondary | ICD-10-CM | POA: Insufficient documentation

## 2017-05-16 DIAGNOSIS — M7918 Myalgia, other site: Secondary | ICD-10-CM

## 2017-05-16 DIAGNOSIS — Z79899 Other long term (current) drug therapy: Secondary | ICD-10-CM | POA: Insufficient documentation

## 2017-05-16 DIAGNOSIS — M25462 Effusion, left knee: Secondary | ICD-10-CM | POA: Diagnosis not present

## 2017-05-16 DIAGNOSIS — M25512 Pain in left shoulder: Secondary | ICD-10-CM | POA: Insufficient documentation

## 2017-05-16 MED ORDER — HYDROCODONE-ACETAMINOPHEN 7.5-325 MG PO TABS: 1.0000 | ORAL_TABLET | Freq: Three times a day (TID) | ORAL | 0 refills | Status: DC | PRN

## 2017-05-16 MED ORDER — OXYCODONE-ACETAMINOPHEN 5-325 MG PO TABS: 1.0000 | ORAL_TABLET | Freq: Three times a day (TID) | ORAL | 0 refills | Status: DC | PRN

## 2017-05-16 MED ORDER — TAPENTADOL HCL 50 MG PO TABS: 50.0000 mg | ORAL_TABLET | Freq: Three times a day (TID) | ORAL | 0 refills | Status: DC | PRN

## 2017-05-16 NOTE — Telephone Encounter (Signed)
Prior auth submitted to [employer] via [place] for Nucynta 50 mg #70 1 q 8 hours prn.

## 2017-05-16 NOTE — Progress Notes (Signed)
Subjective:    Patient ID: Cassandra [name], female    DOB: [birth year], 61 y.o.   MRN: [id number]  HPI: [name] is a 61year old female who returns for follow up appointmentfor chronic pain and medication refill. She statesher pain is located in her neck radiating into her left shoulder and  left arm with tingling and numbness, also states she has tingling occasionally in her right arm alsolowerback pain and left knee pain. Sheratesher pain 7. Her current exercise regime is walking.   Cassandra [name] states "every time she takes her oxycodone she has been experiencing itching and now the itching has increased in intensity. Cassandra [name] is allergic to codeine, hydrocodone and now she's experiencing an  allergic reaction to the oxycodone. Discuss the above with Dr. Naaman Plummer we will prescribe Tapentadol.   Cassandra [name] also reports she had been experiencing chest pain over the last few weeks she did not seek medical attention. At this time she denies chest pain and  SOB, encouraged if this occurs again to call EMS she verbalizes understanding. Also instructed to call her PCP she verbalizes understanding. Voiced concerned of lower extremities edema, trace edema noted. She will follow up with her PCP.   Last UDS was performed on 02/10/2017, it was consistent.   Pain Inventory Average Pain 7 Pain Right Now 7 My pain is sharp, burning, dull, stabbing, tingling and aching  In the last 24 hours, has pain interfered with the following? General activity 6 Relation with others 7 Enjoyment of life 7 What TIME of day is your pain at its worst? morning evening and night Sleep (in general) Poor  Pain is worse with: walking, bending, sitting, inactivity and standing Pain improves with: rest, heat/ice, pacing activities and medication Relief from Meds: 4  Mobility walk without assistance walk with assistance ability to climb steps?  yes do you drive?  yes  Function disabled: date disabled 2007 I  need assistance with the following:  dressing, meal prep, household duties and shopping  Neuro/Psych bladder control problems bowel control problems weakness numbness tremor tingling trouble walking spasms dizziness confusion depression anxiety  Prior Studies Any changes since last visit?  no  Physicians involved in your care Any changes since last visit?  no   Family History  Problem Relation Age of Onset  . Coronary artery disease Mother   . Diabetes Mother        borderline  . Kidney disease Mother        stage 4  . Coronary artery disease Father   . Prostate cancer Father   . Lung disease Father        black lung  . Liver cancer Maternal Grandmother   . Liver disease Maternal Grandmother   . Polymyalgia rheumatica Sister   . Heart disease Sister   . Coronary artery disease Brother        heart attack  . COPD Brother   . Colon polyps Sister   . Lung cancer Paternal Grandmother   . Colon cancer Unknown        Both sides   Social History   Social History  . Marital status: Married    Spouse name: N/A  . Number of children: 1  . Years of education: N/A   Occupational History  . Homemaker    Social History Main Topics  . Smoking status: Former Smoker    Packs/day: 1.00    Years: 42.00    Types: Cigarettes  Quit date: 06/01/2015  . Smokeless tobacco: Never Used     Comment: would like to try chantix, but has not started on it due to warnings related to depression  . Alcohol use No  . Drug use: No  . Sexual activity: Yes   Other Topics Concern  . None   Social History Narrative  . None   Past Surgical History:  Procedure Laterality Date  . ABDOMINAL HYSTERECTOMY     age 62  . ANAL RECTAL MANOMETRY N/A 12/09/2015   Procedure: ANO RECTAL MANOMETRY;  Surgeon: Mauri Pole, MD;  Location: [facility];  Service: Endoscopy;  Laterality: N/A;  . BLADDER REPAIR     sling/with perforation  . CESAREAN SECTION     1 time  . KNEE  ARTHROSCOPY Left   . ruptured uterus    . SHOULDER ARTHROSCOPY     multiple   Past Medical History:  Diagnosis Date  . Anxiety   . Bowel habit changes    excessive gas /frequent BM's with change in shape with pus  . Chronic left shoulder pain    frozen   . Chronic pain    left upper shoulder ,neck and back  . COPD (chronic obstructive pulmonary disease) ([place])    Dr. Redge Gainer  . Depression   . GERD (gastroesophageal reflux disease)   . Hyperlipidemia   . Maternal complication related to childbirth    Sepsis, staph  . Meniere's disease   . Nicotine addiction    2 packs per day   . Rapid or irregular heartbeat   . Skin cancer    Rt. leg, squamous cell  . Tubular adenoma of colon 01/31/2000   Dr. Richmond Campbell  . Weakness of pelvic floor    prolapse -  MRI from 2017   BP 138/81   Pulse 71   SpO2 98%   Opioid Risk Score:  1 Fall Risk Score:  `1  Depression screen PHQ 2/9  Depression screen [facility] 2/9 05/16/2017 02/28/2017 01/27/2017 01/11/2017 12/30/2016 09/27/2016 07/15/2016  Decreased Interest 2 0 3 1 3 3  0  Down, Depressed, Hopeless 1 0 1 2 2 2  0  PHQ - 2 Score 3 0 4 3 5 5  0  Altered sleeping - - 3 3 3 2  -  Tired, decreased energy - - 3 3 3 3  -  Change in appetite - - 3 3 3 2  -  Feeling bad or failure about yourself  - - 1 1 2 1  -  Trouble concentrating - - 1 1 1 1  -  Moving slowly or fidgety/restless - - 2 2 1 2  -  Suicidal thoughts - - 0 0 0 0 -  PHQ-9 Score - - 17 16 18 16  -  Difficult doing work/chores - - - - - - -  Some recent data might be hidden    Review of Systems  Constitutional: Positive for appetite change, diaphoresis and unexpected weight change.  HENT: Negative.   Eyes: Negative.   Respiratory: Negative.   Cardiovascular: Positive for leg swelling.  Gastrointestinal: Positive for abdominal pain, constipation, diarrhea, nausea and vomiting.  Endocrine: Negative.        BS up and down  Genitourinary: Positive for difficulty urinating and dysuria.    Musculoskeletal: Positive for gait problem.       Spasms  Skin: Positive for rash.  Allergic/Immunologic: Negative.   Neurological: Positive for dizziness, tremors and weakness.       Tingling  Hematological: Negative.  Psychiatric/Behavioral: Positive for confusion and dysphoric mood. The patient is nervous/anxious.   All other systems reviewed and are negative.      Objective:   Physical Exam  Constitutional: She is oriented to person, place, and time. She appears well-developed and well-nourished.  HENT:  Head: Normocephalic and atraumatic.  Neck: Normal range of motion. Neck supple.  Cardiovascular: Normal rate and regular rhythm.   Pulmonary/Chest: Effort normal and breath sounds normal.  Musculoskeletal:  Normal Muscle Bulk and Muscle Testing Reveals: Upper Extremities: Upper Extremities: Right: Full ROM and Muscle Strength 5/5 Left: Decreased ROM 30 Degrees and Muscle Strength 4/5 Left AC Joint Tenderness Thoracic Hypersensitivity: T-1-T-6 Lumbar Paraspinal Tenderness: L-4-L-5 Lower Extremities: Full ROM and Muscle Strength 5/5 Arises from Chair with ease Narrow Based Gait  Neurological: She is alert and oriented to person, place, and time.  Skin: Skin is warm and dry.  Psychiatric: She has a normal mood and affect.  Nursing note and vitals reviewed.         Assessment & Plan:  1.Post-traumatic arthritis left shoulder s/p 2 shoulder surgeries. Now with adhesive capsulitis left shoulder. 05/16/2017 RX: Tapentadol 50 mg  one tablet every 8 hours as needed. #70, Oxycodone discontinued due to allergic reaction. We will continue the opioid monitoring program, this consists of regular clinic visits, examinations, urine drug screen, pill counts as well as use of [place] Controlled Substance Reporting System. 2. Cervicalgia, spondylosis/ Cervical Radiculitis: Continue Current Medication Regime.Robaxin, Naprosyn, and Gabapentin. 05/16/2017 3.  Depression/insomnia/anxiety: Continue Lexapro. Continue Ambien 5 mg HS #30. Dr. Livia Snellen prescribing Xanax. 05/16/2017  30 minutes of face to face patient care time was spent during this visit. All questions were encouraged and answered.   F/U in 1 month

## 2017-05-17 ENCOUNTER — Telehealth: Payer: Self-pay | Admitting: Registered Nurse

## 2017-05-17 ENCOUNTER — Telehealth: Payer: Self-pay | Admitting: *Deleted

## 2017-05-17 NOTE — Telephone Encounter (Signed)
I spoke with Dr. Naaman Plummer this morning, regarding Nucynta being denied. Will appeal insurance company, Ms. Hammerschmidt is allergic to Oxycodone.   Spoke with Dr. Naaman Plummer we can prescribe her MSIR, this was discussed with Cassandra [name] she verbalizes understanding. At this time we are placing an appeal to her insurance company. She verbalizes understanding.

## 2017-05-17 NOTE — Telephone Encounter (Signed)
Nucynta was denied by insurance. An expedited appeal by phone was submitted since Cassandra [name] has multiple allergies to narcotics suggested by insurance.

## 2017-05-18 ENCOUNTER — Telehealth: Payer: Self-pay | Admitting: Registered Nurse

## 2017-05-18 MED ORDER — MORPHINE SULFATE 15 MG PO TABS: ORAL_TABLET | ORAL | 0 refills | Status: DC

## 2017-05-18 NOTE — Telephone Encounter (Signed)
Cassandra [name] arrived today checking on her PA regarding the Nucynta. Her insurance approved the Nucynta,spoke to the pharmacist at [facility]. She ran the prescription and the Nucynta will cost Cassandra [name] 79.00. She states The Nucynta is too expensive. I spoke with Dr. Naaman Plummer on 05/17/2017 he stated if the Nucynta is denied we can prescribe the MSIR. MSIR will be prescribed.  Cassandra [name] understanding.   [facility] was called and instructed not to fill the Nucynta.

## 2017-05-24 ENCOUNTER — Telehealth: Payer: Self-pay | Admitting: *Deleted

## 2017-05-24 NOTE — Telephone Encounter (Signed)
Tzirel called about her xray report on her knee.  I read her the results including subluxing patella which she can do thigh strengthening exercises,esp inner thigh to help with alignment.  She is being nauseated by her new medication and wants to go back to the oxycodone acetaminophen at next appt. I will send to [facility] and she can discuss xray with Dr Naaman Plummer to see if any other treatment recommended for the knee pain and effusion.

## 2017-05-24 NOTE — Telephone Encounter (Signed)
Spoke with Dr. Naaman Plummer regarding Cassandra [name] X-ray:  Placed a call to Cassandra [name], she was instructed to purchase a knee sleeve, she stated she has already purchase one and was instructed to perform quad stretching exercises she verbalizes understanding. She is aware it can take up to 4-6 weeks to heal, and verbalizes understanding.

## 2017-05-27 ENCOUNTER — Other Ambulatory Visit: Payer: Self-pay | Admitting: Family Medicine

## 2017-05-29 NOTE — Telephone Encounter (Signed)
Last seen 02/28/17  Dr Livia Snellen

## 2017-06-12 ENCOUNTER — Encounter: Payer: Self-pay | Admitting: Physical Medicine & Rehabilitation

## 2017-06-12 ENCOUNTER — Encounter: Payer: Medicare Other | Attending: Physical Medicine & Rehabilitation | Admitting: Physical Medicine & Rehabilitation

## 2017-06-12 VITALS — BP 122/75 | HR 71

## 2017-06-12 DIAGNOSIS — Z8371 Family history of colonic polyps: Secondary | ICD-10-CM | POA: Diagnosis not present

## 2017-06-12 DIAGNOSIS — Z8042 Family history of malignant neoplasm of prostate: Secondary | ICD-10-CM | POA: Insufficient documentation

## 2017-06-12 DIAGNOSIS — G8929 Other chronic pain: Secondary | ICD-10-CM

## 2017-06-12 DIAGNOSIS — M7631 Iliotibial band syndrome, right leg: Secondary | ICD-10-CM | POA: Diagnosis not present

## 2017-06-12 DIAGNOSIS — Z85828 Personal history of other malignant neoplasm of skin: Secondary | ICD-10-CM | POA: Insufficient documentation

## 2017-06-12 DIAGNOSIS — M7502 Adhesive capsulitis of left shoulder: Secondary | ICD-10-CM | POA: Diagnosis not present

## 2017-06-12 DIAGNOSIS — F419 Anxiety disorder, unspecified: Secondary | ICD-10-CM | POA: Diagnosis not present

## 2017-06-12 DIAGNOSIS — Z825 Family history of asthma and other chronic lower respiratory diseases: Secondary | ICD-10-CM | POA: Diagnosis not present

## 2017-06-12 DIAGNOSIS — F329 Major depressive disorder, single episode, unspecified: Secondary | ICD-10-CM | POA: Insufficient documentation

## 2017-06-12 DIAGNOSIS — K219 Gastro-esophageal reflux disease without esophagitis: Secondary | ICD-10-CM | POA: Diagnosis not present

## 2017-06-12 DIAGNOSIS — M19012 Primary osteoarthritis, left shoulder: Secondary | ICD-10-CM | POA: Diagnosis not present

## 2017-06-12 DIAGNOSIS — M25561 Pain in right knee: Secondary | ICD-10-CM

## 2017-06-12 DIAGNOSIS — M25562 Pain in left knee: Secondary | ICD-10-CM

## 2017-06-12 DIAGNOSIS — G4700 Insomnia, unspecified: Secondary | ICD-10-CM | POA: Insufficient documentation

## 2017-06-12 DIAGNOSIS — Z801 Family history of malignant neoplasm of trachea, bronchus and lung: Secondary | ICD-10-CM | POA: Insufficient documentation

## 2017-06-12 DIAGNOSIS — J449 Chronic obstructive pulmonary disease, unspecified: Secondary | ICD-10-CM | POA: Diagnosis not present

## 2017-06-12 DIAGNOSIS — M19112 Post-traumatic osteoarthritis, left shoulder: Secondary | ICD-10-CM | POA: Diagnosis not present

## 2017-06-12 DIAGNOSIS — Z8249 Family history of ischemic heart disease and other diseases of the circulatory system: Secondary | ICD-10-CM | POA: Diagnosis not present

## 2017-06-12 DIAGNOSIS — Z841 Family history of disorders of kidney and ureter: Secondary | ICD-10-CM | POA: Diagnosis not present

## 2017-06-12 DIAGNOSIS — Z87891 Personal history of nicotine dependence: Secondary | ICD-10-CM | POA: Diagnosis not present

## 2017-06-12 DIAGNOSIS — M47812 Spondylosis without myelopathy or radiculopathy, cervical region: Secondary | ICD-10-CM | POA: Diagnosis not present

## 2017-06-12 DIAGNOSIS — Z90710 Acquired absence of both cervix and uterus: Secondary | ICD-10-CM | POA: Insufficient documentation

## 2017-06-12 DIAGNOSIS — E785 Hyperlipidemia, unspecified: Secondary | ICD-10-CM | POA: Diagnosis not present

## 2017-06-12 DIAGNOSIS — Z833 Family history of diabetes mellitus: Secondary | ICD-10-CM | POA: Diagnosis not present

## 2017-06-12 MED ORDER — MORPHINE SULFATE 15 MG PO TABS: ORAL_TABLET | ORAL | 0 refills | Status: DC

## 2017-06-12 MED ORDER — DICLOFENAC SODIUM 75 MG PO TBEC: 75.0000 mg | DELAYED_RELEASE_TABLET | Freq: Two times a day (BID) | ORAL | 3 refills | Status: DC

## 2017-06-12 NOTE — Progress Notes (Signed)
Subjective:    Patient ID: Cassandra [name], female    DOB: [birth year], 61 y.o.   MRN: [id number]  HPI  Cassandra [name] is here in follow up of her chronic pain. She has like the change to morphine which has helped her feel better emotionally and slightly better from a pain standpoint. She has noticed increased swelling and pain in her knees, left more than right. Her knees bother her with walking and standing. She's trying to walk more but feels she pays the price later. She is doing some basic quad strengthening exercises she found on line. She feels they have helped her left knee a bit. She also thinks that the knees are affecting her low back.   Left knee xr was recently ordered:  Slight lateral patellar subluxation without dislocation. Small joint effusion. No fracture. No appreciable joint space narrowing or Erosion.  Left shoulder remains unchangd. Her mood is good. She is moving her bowels and bladde.r   Pain Inventory Average Pain 6 Pain Right Now 6 My pain is intermittent, constant, sharp, burning, dull, stabbing, tingling and aching  In the last 24 hours, has pain interfered with the following? General activity 6 Relation with others 6 Enjoyment of life 6 What TIME of day is your pain at its worst? night Sleep (in general) Poor  Pain is worse with: walking, bending, sitting, inactivity, standing and some activites Pain improves with: rest, heat/ice and medication Relief from Meds: 5  Mobility walk without assistance ability to climb steps?  yes do you drive?  yes needs help with transfers transfers alone  Function disabled: date disabled . I need assistance with the following:  dressing, household duties and shopping  Neuro/Psych bladder control problems bowel control problems weakness numbness tremor tingling trouble walking spasms dizziness depression anxiety  Prior Studies Any changes since last visit?  no  Physicians involved in your care Any changes  since last visit?  no   Family History  Problem Relation Age of Onset  . Coronary artery disease Mother   . Diabetes Mother        borderline  . Kidney disease Mother        stage 4  . Coronary artery disease Father   . Prostate cancer Father   . Lung disease Father        black lung  . Liver cancer Maternal Grandmother   . Liver disease Maternal Grandmother   . Polymyalgia rheumatica Sister   . Heart disease Sister   . Coronary artery disease Brother        heart attack  . COPD Brother   . Colon polyps Sister   . Lung cancer Paternal Grandmother   . Colon cancer Unknown        Both sides   Social History   Social History  . Marital status: Married    Spouse name: N/A  . Number of children: 1  . Years of education: N/A   Occupational History  . Homemaker    Social History Main Topics  . Smoking status: Former Smoker    Packs/day: 1.00    Years: 42.00    Types: Cigarettes    Quit date: 06/01/2015  . Smokeless tobacco: Never Used     Comment: would like to try chantix, but has not started on it due to warnings related to depression  . Alcohol use No  . Drug use: No  . Sexual activity: Yes   Other Topics Concern  . None  Social History Narrative  . None   Past Surgical History:  Procedure Laterality Date  . ABDOMINAL HYSTERECTOMY     age 28  . ANAL RECTAL MANOMETRY N/A 12/09/2015   Procedure: ANO RECTAL MANOMETRY;  Surgeon: Mauri Pole, MD;  Location: [facility];  Service: Endoscopy;  Laterality: N/A;  . BLADDER REPAIR     sling/with perforation  . CESAREAN SECTION     1 time  . KNEE ARTHROSCOPY Left   . ruptured uterus    . SHOULDER ARTHROSCOPY     multiple   Past Medical History:  Diagnosis Date  . Anxiety   . Bowel habit changes    excessive gas /frequent BM's with change in shape with pus  . Chronic left shoulder pain    frozen   . Chronic pain    left upper shoulder ,neck and back  . COPD (chronic obstructive pulmonary disease)  ([place])    Dr. Redge Gainer  . Depression   . GERD (gastroesophageal reflux disease)   . Hyperlipidemia   . Maternal complication related to childbirth    Sepsis, staph  . Meniere's disease   . Nicotine addiction    2 packs per day   . Rapid or irregular heartbeat   . Skin cancer    Rt. leg, squamous cell  . Tubular adenoma of colon 01/31/2000   Dr. Richmond Campbell  . Weakness of pelvic floor    prolapse -  MRI from 2017   BP 122/75 (BP Location: Right Arm, Patient Position: Sitting, Cuff Size: Normal)   Pulse 71   SpO2 95%   Opioid Risk Score:   Fall Risk Score:  `1  Depression screen PHQ 2/9  Depression screen [facility] 2/9 05/16/2017 02/28/2017 01/27/2017 01/11/2017 12/30/2016 09/27/2016 07/15/2016  Decreased Interest 2 0 3 1 3 3  0  Down, Depressed, Hopeless 1 0 1 2 2 2  0  PHQ - 2 Score 3 0 4 3 5 5  0  Altered sleeping - - 3 3 3 2  -  Tired, decreased energy - - 3 3 3 3  -  Change in appetite - - 3 3 3 2  -  Feeling bad or failure about yourself  - - 1 1 2 1  -  Trouble concentrating - - 1 1 1 1  -  Moving slowly or fidgety/restless - - 2 2 1 2  -  Suicidal thoughts - - 0 0 0 0 -  PHQ-9 Score - - 17 16 18 16  -  Difficult doing work/chores - - - - - - -  Some recent data might be hidden    Review of Systems  Constitutional: Positive for diaphoresis and unexpected weight change.  HENT: Negative.   Respiratory: Positive for cough and shortness of breath.   Cardiovascular: Positive for leg swelling.  Gastrointestinal: Positive for abdominal pain, constipation, diarrhea, nausea and vomiting.  Genitourinary: Positive for difficulty urinating and dysuria.  Musculoskeletal: Positive for arthralgias, back pain, gait problem, myalgias and neck pain.       Spasms  Skin: Positive for rash.  Neurological: Positive for tremors, weakness, numbness and headaches.       Tingling  Psychiatric/Behavioral: Positive for dysphoric mood. The patient is nervous/anxious.        Objective:   Physical  Exam  General: Alert and oriented x 3, No apparent distress.    HEENT:Head is normocephalic, atraumatic, PERRLA, EOMI, sclera anicteric, oral mucosa pink and moist, dentition intact, ext ear canals clear,  Neck:Supple without JVD or lymphadenopathy  Heart:RRR without murmur. No JVD  Chest:CTA Bilaterally without wheezes or rales. Normal effort    Abdomen:soft BS+ Extremities:No clubbing, cyanosis, or edema. Pulses are 2+  Skin:visible skin intact  Neuro:cognitvely intact. Strength 5/5. Normal sensory  Musculoskeletal:left shoulder remains very limited in ROM active and passive. Neck affected in rotation and bending to left. Left knee with slight warmth. Minimal swelling. Normal tracking of patella while seated. Mild meniscal pain. Antalgia with WB bialterally. Mild crepiturs.  Psych:Pt's affect is appropriate. Remains pleasant as always.     Assessment & Plan:  1. Post-traumatic arthritis left shoulder s/p 2 shoulder surgeries. Now with adhesive capsulitis left shoulder.  2. Cervicalgia, spondylosis.  3. Right ITB syndrome  4. Depression/insomnia/anxiety  5. History of right chest pain/tingling----?resolved 6. Bilateral knee pain, Left > right. ?likely meniscal/soft tissue injury   Plan:  1. Continue TENS to shoulder and neck  2.  Extensive knee exercises were reviewed and  provided. Provided a list of supplements for OA. Needs were "sensible shoes" as well for normal walking as well as for exercise. 3. Gabapentin may continue  4. Naproxen 500mg  bid changed to diclofenac 75mg  bid 5. Consider knee injections, MRI 6. Refilled ms IR 15mg  #30.Will not increase at this time, as it's tolerated and fairly effective. We will continue the opioid monitoring program, this consists of regular clinic visits, examinations, routine drug screening, pill counts as well as use of [place] Controlled Substance Reporting System. [place].  .  7. Continue  traction/posture exercises for neck as possible  8. Lexapro for depression/anxiety at 20mg  daily  9.  I or NP will see her back in about 1 months. 25 minutes of face to face patient care time were spent during this visit. All questions were encouraged and answered. Greater than 50% of time during this encounter was spent counseling patient/family in regard to review of xrays, treatment of oa/exercises, gait mechanics. Marland Kitchen  Marland Kitchen

## 2017-06-12 NOTE — Patient Instructions (Addendum)
SUPPLEMENTS USEFUL FOR OSTEOARTHRITIS: OMEGA 3 FATTY ACIDS, TURMERIC, GINGER, TART CHERRY EXTRACT, CELERY SEED, GLUCOSAMINE WITH CHONDROITIN   WEAR SENSIBLE SHOES!!!!   FOCUS ON OTHER EXERCISE OTHER THAN WALKING WHICH IS LOWER IMPACT :  BIKE/WATER   PLEASE FEEL FREE TO CALL OUR OFFICE WITH ANY PROBLEMS OR QUESTIONS ([phone])

## 2017-06-20 ENCOUNTER — Ambulatory Visit (INDEPENDENT_AMBULATORY_CARE_PROVIDER_SITE_OTHER): Payer: Medicare Other | Admitting: Family Medicine

## 2017-06-20 ENCOUNTER — Encounter: Payer: Self-pay | Admitting: Family Medicine

## 2017-06-20 VITALS — BP 121/74 | HR 66 | Temp 97.2°F | Ht 66.0 in | Wt 160.0 lb

## 2017-06-20 DIAGNOSIS — Z0000 Encounter for general adult medical examination without abnormal findings: Secondary | ICD-10-CM | POA: Diagnosis not present

## 2017-06-20 DIAGNOSIS — K582 Mixed irritable bowel syndrome: Secondary | ICD-10-CM | POA: Diagnosis not present

## 2017-06-20 DIAGNOSIS — R8290 Unspecified abnormal findings in urine: Secondary | ICD-10-CM

## 2017-06-20 DIAGNOSIS — E782 Mixed hyperlipidemia: Secondary | ICD-10-CM | POA: Diagnosis not present

## 2017-06-20 DIAGNOSIS — M7918 Myalgia, other site: Secondary | ICD-10-CM

## 2017-06-20 DIAGNOSIS — Z23 Encounter for immunization: Secondary | ICD-10-CM

## 2017-06-20 DIAGNOSIS — Z86010 Personal history of colonic polyps: Secondary | ICD-10-CM

## 2017-06-20 DIAGNOSIS — M47812 Spondylosis without myelopathy or radiculopathy, cervical region: Secondary | ICD-10-CM

## 2017-06-20 DIAGNOSIS — G4700 Insomnia, unspecified: Secondary | ICD-10-CM

## 2017-06-20 DIAGNOSIS — F3289 Other specified depressive episodes: Secondary | ICD-10-CM

## 2017-06-20 DIAGNOSIS — F411 Generalized anxiety disorder: Secondary | ICD-10-CM

## 2017-06-20 LAB — URINALYSIS
Bilirubin, UA: NEGATIVE
Glucose, UA: NEGATIVE
NITRITE UA: POSITIVE — AB
PROTEIN UA: NEGATIVE
RBC UA: NEGATIVE
Specific Gravity, UA: 1.025 (ref 1.005–1.030)
Urobilinogen, Ur: 0.2 mg/dL (ref 0.2–1.0)
pH, UA: 6.5 (ref 5.0–7.5)

## 2017-06-20 MED ORDER — OMEPRAZOLE 20 MG PO CPDR: DELAYED_RELEASE_CAPSULE | ORAL | 0 refills | Status: DC

## 2017-06-20 MED ORDER — ONDANSETRON 8 MG PO TBDP: 8.0000 mg | ORAL_TABLET | Freq: Four times a day (QID) | ORAL | 6 refills | Status: DC | PRN

## 2017-06-20 MED ORDER — NITROFURANTOIN MONOHYD MACRO 100 MG PO CAPS: 100.0000 mg | ORAL_CAPSULE | Freq: Two times a day (BID) | ORAL | 0 refills | Status: DC

## 2017-06-20 MED ORDER — ZOLPIDEM TARTRATE 5 MG PO TABS: 5.0000 mg | ORAL_TABLET | Freq: Every evening | ORAL | 5 refills | Status: DC | PRN

## 2017-06-20 MED ORDER — LINACLOTIDE 290 MCG PO CAPS: 290.0000 ug | ORAL_CAPSULE | Freq: Every day | ORAL | 5 refills | Status: DC

## 2017-06-20 MED ORDER — ALPRAZOLAM 1 MG PO TABS: ORAL_TABLET | ORAL | 5 refills | Status: DC

## 2017-06-20 MED ORDER — ESCITALOPRAM OXALATE 20 MG PO TABS: 20.0000 mg | ORAL_TABLET | Freq: Every day | ORAL | 0 refills | Status: DC

## 2017-06-20 NOTE — Progress Notes (Signed)
Subjective:  Patient ID: Cassandra [name], female    DOB: 1955-12-28  Age: 61 y.o. MRN: [id number]  CC: Annual Exam (pt here today for CPE with pap smear)   HPI Cassandra [name] presents for Complete physical exam. She continues to have GI issues from her irritable bowel in spite of workup. She is taking medicines as noted below that help control her anxiety and depression symptoms. PH Q below reviewed. Additionally she is attending pain clinic with Dr. Tessa Lerner regularly and feels a recent switch to morphine has improved her outlook. This also is working well to relieve her chronic neck and back pain as well as fibromyalgia pain  Depression screen [facility] 2/9 06/20/2017 05/16/2017 02/28/2017  Decreased Interest 1 2 0  Down, Depressed, Hopeless 1 1 0  PHQ - 2 Score 2 3 0  Altered sleeping 2 - -  Tired, decreased energy 2 - -  Change in appetite 2 - -  Feeling bad or failure about yourself  0 - -  Trouble concentrating 1 - -  Moving slowly or fidgety/restless 1 - -  Suicidal thoughts 0 - -  PHQ-9 Score 10 - -  Difficult doing work/chores - - -  Some recent data might be hidden    History Cassandra [name] has a past medical history of Anxiety; Bowel habit changes; Chronic left shoulder pain; Chronic pain; COPD (chronic obstructive pulmonary disease) ([place]); Depression; GERD (gastroesophageal reflux disease); Hyperlipidemia; Maternal complication related to childbirth; Meniere's disease; Nicotine addiction; Rapid or irregular heartbeat; Skin cancer; Tubular adenoma of colon (01/31/2000); and Weakness of pelvic floor.   She has a past surgical history that includes Bladder repair; Shoulder arthroscopy; Abdominal hysterectomy; Knee arthroscopy (Left); Cesarean section; ruptured uterus; and Anal Rectal manometry (N/A, 12/09/2015).   Her family history includes COPD in her brother; Colon cancer in her unknown relative; Colon polyps in her sister; Coronary artery disease in her brother, father, and mother; Diabetes in  her mother; Heart disease in her sister; Kidney disease in her mother; Liver cancer in her maternal grandmother; Liver disease in her maternal grandmother; Lung cancer in her paternal grandmother; Lung disease in her father; Polymyalgia rheumatica in her sister; Prostate cancer in her father.She reports that she quit smoking about 2 years ago. Her smoking use included Cigarettes. She has a 42.00 pack-year smoking history. She has never used smokeless tobacco. She reports that she does not drink alcohol or use drugs.    ROS Review of Systems  Constitutional: Negative for appetite change, chills, diaphoresis, fatigue, fever and unexpected weight change.  HENT: Negative for congestion, ear pain, hearing loss, postnasal drip, rhinorrhea, sneezing, sore throat and trouble swallowing.   Eyes: Negative for pain.  Respiratory: Negative for cough, chest tightness and shortness of breath.   Cardiovascular: Negative for chest pain and palpitations.  Gastrointestinal: Positive for nausea and vomiting. Negative for abdominal pain, constipation and diarrhea.  Endocrine: Negative for cold intolerance, heat intolerance, polydipsia, polyphagia and polyuria.  Genitourinary: Negative for dysuria, frequency and menstrual problem.  Musculoskeletal: Positive for arthralgias and myalgias. Negative for joint swelling.  Skin: Negative for rash.  Allergic/Immunologic: Negative for environmental allergies.  Neurological: Negative for dizziness, weakness, numbness and headaches.  Psychiatric/Behavioral: Negative for agitation and dysphoric mood.    Objective:  BP 121/74   Pulse 66   Temp (!) 97.2 F (36.2 C) (Oral)   Ht 5' 6"  (1.676 m)   Wt 160 lb (72.6 kg)   BMI 25.82 kg/m   BP Readings  from Last 3 Encounters:  06/20/17 121/74  06/12/17 122/75  05/16/17 138/81    Wt Readings from Last 3 Encounters:  06/20/17 160 lb (72.6 kg)  02/28/17 157 lb (71.2 kg)  01/27/17 155 lb (70.3 kg)     Physical Exam    Constitutional: She is oriented to person, place, and time. She appears well-developed and well-nourished. No distress.  HENT:  Head: Normocephalic and atraumatic.  Right Ear: External ear normal.  Left Ear: External ear normal.  Nose: Nose normal.  Mouth/Throat: Oropharynx is clear and moist.  Eyes: Pupils are equal, round, and reactive to light. Conjunctivae and EOM are normal.  Neck: Normal range of motion. Neck supple. No thyromegaly present.  Cardiovascular: Normal rate, regular rhythm and normal heart sounds.   No murmur heard. Pulmonary/Chest: Effort normal and breath sounds normal. No respiratory distress. She has no wheezes. She has no rales. Right breast exhibits no inverted nipple, no mass and no tenderness. Left breast exhibits no inverted nipple, no mass and no tenderness. Breasts are symmetrical.  Abdominal: Soft. Normal appearance and bowel sounds are normal. She exhibits no distension, no abdominal bruit and no mass. There is no splenomegaly or hepatomegaly. There is tenderness (mild, diffuse). There is no rebound, no guarding, no tenderness at McBurney's point and negative Murphy's sign.  Musculoskeletal: Normal range of motion. She exhibits no edema or tenderness.   Exception to full range of motion is that the patient cannot abduct the left shoulder above 90.  Lymphadenopathy:    She has no cervical adenopathy.  Neurological: She is alert and oriented to person, place, and time. She has normal reflexes.  Skin: Skin is warm and dry. No rash noted.  Psychiatric: She has a normal mood and affect. Her behavior is normal. Judgment and thought content normal.      Assessment & Plan:   Cassandra [name] was seen today for annual exam.  Diagnoses and all orders for this visit:  Cloudy urine -     Urine Culture -     Urinalysis -     Urine Culture  Well adult exam -     CBC with Differential/Platelet -     CMP14+EGFR -     Lipid panel -     TSH -     VITAMIN D 25 Hydroxy  (Vit-D Deficiency, Fractures) -     Sedimentation Rate  Mixed hyperlipidemia -     CMP14+EGFR -     Lipid panel  Myofascial pain  Cervical spondylosis without myelopathy  Irritable bowel syndrome with both constipation and diarrhea -     Sedimentation Rate  GAD (generalized anxiety disorder)  Other depression -     TSH -     VITAMIN D 25 Hydroxy (Vit-D Deficiency, Fractures)  Personal history of colonic polyps  Insomnia, unspecified type  Need for immunization against influenza -     Flu Vaccine QUAD 36+ mos IM  Other orders -     ondansetron (ZOFRAN-ODT) 8 MG disintegrating tablet; Take 1 tablet (8 mg total) by mouth every 6 (six) hours as needed for nausea or vomiting. -     ALPRAZolam (XANAX) 1 MG tablet; Take one tablet twice daily for Meniere's Disease -     escitalopram (LEXAPRO) 20 MG tablet; Take 1 tablet (20 mg total) by mouth at bedtime. -     linaclotide (LINZESS) 290 MCG CAPS capsule; Take 1 capsule (290 mcg total) by mouth daily. To regulate bowel movements -  omeprazole (PRILOSEC) 20 MG capsule; TAKE 1 CAPSULE (20 MG TOTAL) BY MOUTH DAILY. -     zolpidem (AMBIEN) 5 MG tablet; Take 1 tablet (5 mg total) by mouth at bedtime as needed. for sleep -     nitrofurantoin, macrocrystal-monohydrate, (MACROBID) 100 MG capsule; Take 1 capsule (100 mg total) by mouth 2 (two) times daily.       I have discontinued Ms. Szalkowski's fluconazole and nitrofurantoin (macrocrystal-monohydrate). I have also changed her zolpidem. Additionally, I am having her start on nitrofurantoin (macrocrystal-monohydrate). Lastly, I am having her maintain her conjugated estrogens, fexofenadine, meclizine, hydroxypropyl methylcellulose / hypromellose, chlorhexidine, mupirocin ointment, cholecalciferol, gabapentin, gabapentin, methocarbamol, diclofenac, morphine, ondansetron, ALPRAZolam, escitalopram, linaclotide, and omeprazole.  Allergies as of 06/20/2017      Reactions   Chantix [varenicline]  Rash   Codeine Itching, Nausea And Vomiting   Cymbalta [duloxetine Hcl] Nausea Only   Livalo [pitavastatin] Other (See Comments)   Seizures    Metoprolol Other (See Comments)   Had episode of possible hypotension or seizure described and muscles clamping down and searing pain toe to head and near fall x2. Loss of bowel control. No further symptoms after stopped.   Oxycodone-aspirin Nausea And Vomiting   Oxycontin [oxycodone Hcl] Other (See Comments)   Altered mental status   Propoxyphene N-acetaminophen Nausea And Vomiting   Trazodone And Nefazodone Other (See Comments)   Medication is too strong   Zithromax [azithromycin] Other (See Comments)   Unknown    Other Rash   Lipturzet caused enlarged veins in legs   Oxycodone-acetaminophen Hives, Nausea And Vomiting, Rash   Septra [sulfamethoxazole-trimethoprim] Rash      Medication List       Accurate as of 06/20/17  5:42 PM. Always use your most recent med list.          ALPRAZolam 1 MG tablet Commonly known as:  XANAX Take one tablet twice daily for Meniere's Disease   chlorhexidine 4 % external liquid Commonly known as:  HIBICLENS Shower daily for 5 days. Apply all over, including scalp and leave on  5 min before rinsing.   cholecalciferol 1000 units tablet Commonly known as:  VITAMIN D Take 1,000 Units by mouth daily.   conjugated estrogens vaginal cream Commonly known as:  PREMARIN Place vaginally daily.   diclofenac 75 MG EC tablet Commonly known as:  VOLTAREN Take 1 tablet (75 mg total) by mouth 2 (two) times daily with a meal.   escitalopram 20 MG tablet Commonly known as:  LEXAPRO Take 1 tablet (20 mg total) by mouth at bedtime.   fexofenadine 180 MG tablet Commonly known as:  ALLEGRA Take 1 tablet (180 mg total) by mouth daily as needed for allergies.   gabapentin 100 MG capsule Commonly known as:  NEURONTIN TAKE 1 CAPSULE (100 MG TOTAL) BY MOUTH 2 (TWO) TIMES DAILY.   gabapentin 300 MG  capsule Commonly known as:  NEURONTIN TAKE 1 CAPSULE (300 MG TOTAL) BY MOUTH 3 (THREE) TIMES DAILY.   hydroxypropyl methylcellulose / hypromellose 2.5 % ophthalmic solution Commonly known as:  ISOPTO TEARS / GONIOVISC Place 1 drop into both eyes as needed for dry eyes.   linaclotide 290 MCG Caps capsule Commonly known as:  LINZESS Take 1 capsule (290 mcg total) by mouth daily. To regulate bowel movements   meclizine 25 MG tablet Commonly known as:  ANTIVERT TAKE 1 TABLET (25 MG TOTAL) BY MOUTH 2 (TWO) TIMES DAILY AS NEEDED.   methocarbamol 500 MG tablet Commonly known as:  ROBAXIN TAKE 1 TABLET BY MOUTH TWICE A DAY AS NEEDED FOR MUSCLE SPASMS   morphine 15 MG tablet Commonly known as:  MSIR Take a half tablet twice a day as needed for moderate pain   mupirocin ointment 2 % Commonly known as:  BACTROBAN Place 1 application into the nose 2 (two) times daily.   nitrofurantoin (macrocrystal-monohydrate) 100 MG capsule Commonly known as:  MACROBID Take 1 capsule (100 mg total) by mouth 2 (two) times daily.   omeprazole 20 MG capsule Commonly known as:  PRILOSEC TAKE 1 CAPSULE (20 MG TOTAL) BY MOUTH DAILY.   ondansetron 8 MG disintegrating tablet Commonly known as:  ZOFRAN-ODT Take 1 tablet (8 mg total) by mouth every 6 (six) hours as needed for nausea or vomiting.   zolpidem 5 MG tablet Commonly known as:  AMBIEN Take 1 tablet (5 mg total) by mouth at bedtime as needed. for sleep        Follow-up: Return in about 6 months (around 12/19/2017).  [name], M.D.

## 2017-06-21 LAB — CMP14+EGFR
ALBUMIN: 4.6 g/dL (ref 3.6–4.8)
ALK PHOS: 107 IU/L (ref 39–117)
ALT: 15 IU/L (ref 0–32)
AST: 23 IU/L (ref 0–40)
Albumin/Globulin Ratio: 1.8 (ref 1.2–2.2)
BUN / CREAT RATIO: 10 — AB (ref 12–28)
BUN: 11 mg/dL (ref 8–27)
Bilirubin Total: 0.3 mg/dL (ref 0.0–1.2)
CO2: 23 mmol/L (ref 20–29)
CREATININE: 1.12 mg/dL — AB (ref 0.57–1.00)
Calcium: 9.2 mg/dL (ref 8.7–10.3)
Chloride: 104 mmol/L (ref 96–106)
GFR calc non Af Amer: 53 mL/min/{1.73_m2} — ABNORMAL LOW (ref >59)
GFR, EST AFRICAN AMERICAN: 61 mL/min/{1.73_m2} (ref >59)
GLOBULIN, TOTAL: 2.6 g/dL (ref 1.5–4.5)
GLUCOSE: 80 mg/dL (ref 65–99)
Potassium: 4.9 mmol/L (ref 3.5–5.2)
SODIUM: 145 mmol/L — AB (ref 134–144)
TOTAL PROTEIN: 7.2 g/dL (ref 6.0–8.5)

## 2017-06-21 LAB — CBC WITH DIFFERENTIAL/PLATELET
BASOS ABS: 0 10*3/uL (ref 0.0–0.2)
Basos: 1 %
EOS (ABSOLUTE): 0.2 10*3/uL (ref 0.0–0.4)
EOS: 3 %
HEMATOCRIT: 39.8 % (ref 34.0–46.6)
HEMOGLOBIN: 13.3 g/dL (ref 11.1–15.9)
IMMATURE GRANS (ABS): 0 10*3/uL (ref 0.0–0.1)
Immature Granulocytes: 0 %
LYMPHS ABS: 1.9 10*3/uL (ref 0.7–3.1)
LYMPHS: 34 %
MCH: 29.4 pg (ref 26.6–33.0)
MCHC: 33.4 g/dL (ref 31.5–35.7)
MCV: 88 fL (ref 79–97)
MONOCYTES: 8 %
Monocytes Absolute: 0.4 10*3/uL (ref 0.1–0.9)
NEUTROS ABS: 3.1 10*3/uL (ref 1.4–7.0)
Neutrophils: 54 %
Platelets: 307 10*3/uL (ref 150–379)
RBC: 4.53 x10E6/uL (ref 3.77–5.28)
RDW: 15 % (ref 12.3–15.4)
WBC: 5.6 10*3/uL (ref 3.4–10.8)

## 2017-06-21 LAB — LIPID PANEL
CHOL/HDL RATIO: 4.4 ratio (ref 0.0–4.4)
CHOLESTEROL TOTAL: 283 mg/dL — AB (ref 100–199)
HDL: 64 mg/dL (ref >39)
LDL CALC: 200 mg/dL — AB (ref 0–99)
Triglycerides: 96 mg/dL (ref 0–149)
VLDL CHOLESTEROL CAL: 19 mg/dL (ref 5–40)

## 2017-06-21 LAB — TSH: TSH: 3.13 u[IU]/mL (ref 0.450–4.500)

## 2017-06-21 LAB — VITAMIN D 25 HYDROXY (VIT D DEFICIENCY, FRACTURES): Vit D, 25-Hydroxy: 32.6 ng/mL (ref 30.0–100.0)

## 2017-06-21 LAB — SEDIMENTATION RATE: Sed Rate: 22 mm/hr (ref 0–40)

## 2017-06-22 LAB — URINE CULTURE

## 2017-06-23 ENCOUNTER — Other Ambulatory Visit: Payer: Self-pay | Admitting: Family Medicine

## 2017-06-23 LAB — URINE CULTURE

## 2017-06-26 ENCOUNTER — Other Ambulatory Visit: Payer: Self-pay | Admitting: Family Medicine

## 2017-07-03 ENCOUNTER — Ambulatory Visit: Payer: Medicare Other | Admitting: Family Medicine

## 2017-07-11 ENCOUNTER — Encounter: Payer: Self-pay | Admitting: *Deleted

## 2017-07-12 ENCOUNTER — Encounter (HOSPITAL_BASED_OUTPATIENT_CLINIC_OR_DEPARTMENT_OTHER): Payer: Medicare Other | Admitting: Physical Medicine & Rehabilitation

## 2017-07-12 ENCOUNTER — Encounter: Payer: Self-pay | Admitting: Physical Medicine & Rehabilitation

## 2017-07-12 VITALS — BP 149/87 | HR 75

## 2017-07-12 DIAGNOSIS — M19112 Post-traumatic osteoarthritis, left shoulder: Secondary | ICD-10-CM | POA: Diagnosis not present

## 2017-07-12 DIAGNOSIS — M25561 Pain in right knee: Secondary | ICD-10-CM

## 2017-07-12 DIAGNOSIS — Z841 Family history of disorders of kidney and ureter: Secondary | ICD-10-CM | POA: Diagnosis not present

## 2017-07-12 DIAGNOSIS — Z8249 Family history of ischemic heart disease and other diseases of the circulatory system: Secondary | ICD-10-CM | POA: Diagnosis not present

## 2017-07-12 DIAGNOSIS — Z801 Family history of malignant neoplasm of trachea, bronchus and lung: Secondary | ICD-10-CM | POA: Diagnosis not present

## 2017-07-12 DIAGNOSIS — M7502 Adhesive capsulitis of left shoulder: Secondary | ICD-10-CM | POA: Diagnosis not present

## 2017-07-12 DIAGNOSIS — M25562 Pain in left knee: Secondary | ICD-10-CM

## 2017-07-12 DIAGNOSIS — M47812 Spondylosis without myelopathy or radiculopathy, cervical region: Secondary | ICD-10-CM | POA: Diagnosis not present

## 2017-07-12 DIAGNOSIS — Z825 Family history of asthma and other chronic lower respiratory diseases: Secondary | ICD-10-CM | POA: Diagnosis not present

## 2017-07-12 DIAGNOSIS — Z8042 Family history of malignant neoplasm of prostate: Secondary | ICD-10-CM | POA: Diagnosis not present

## 2017-07-12 DIAGNOSIS — E785 Hyperlipidemia, unspecified: Secondary | ICD-10-CM | POA: Diagnosis not present

## 2017-07-12 DIAGNOSIS — Z8371 Family history of colonic polyps: Secondary | ICD-10-CM | POA: Diagnosis not present

## 2017-07-12 DIAGNOSIS — Z833 Family history of diabetes mellitus: Secondary | ICD-10-CM | POA: Diagnosis not present

## 2017-07-12 DIAGNOSIS — G8929 Other chronic pain: Secondary | ICD-10-CM | POA: Diagnosis not present

## 2017-07-12 DIAGNOSIS — Z85828 Personal history of other malignant neoplasm of skin: Secondary | ICD-10-CM | POA: Diagnosis not present

## 2017-07-12 DIAGNOSIS — G4700 Insomnia, unspecified: Secondary | ICD-10-CM | POA: Diagnosis not present

## 2017-07-12 DIAGNOSIS — M19012 Primary osteoarthritis, left shoulder: Secondary | ICD-10-CM | POA: Diagnosis not present

## 2017-07-12 DIAGNOSIS — M7631 Iliotibial band syndrome, right leg: Secondary | ICD-10-CM

## 2017-07-12 DIAGNOSIS — K219 Gastro-esophageal reflux disease without esophagitis: Secondary | ICD-10-CM | POA: Diagnosis not present

## 2017-07-12 DIAGNOSIS — J449 Chronic obstructive pulmonary disease, unspecified: Secondary | ICD-10-CM | POA: Diagnosis not present

## 2017-07-12 DIAGNOSIS — Z87891 Personal history of nicotine dependence: Secondary | ICD-10-CM | POA: Diagnosis not present

## 2017-07-12 MED ORDER — MORPHINE SULFATE 15 MG PO TABS: ORAL_TABLET | ORAL | 0 refills | Status: DC

## 2017-07-12 NOTE — Patient Instructions (Signed)
PLEASE FEEL FREE TO CALL OUR OFFICE WITH ANY PROBLEMS OR QUESTIONS ([phone])      

## 2017-07-12 NOTE — Progress Notes (Signed)
Subjective:    Patient ID: Cassandra [name], female    DOB: [birth year], 61 y.o.   MRN: [id number]  HPI   Cassandra [name] is here in follow up of her chronic pain. She is doing better with the diclofenac. She still knows when her "knee is going to act up. She tries to adjust her activity accordingly.   MS IR is working for her pain. She does struggle a bit with IBS/constipation. She uses a stool softener once in a while.     Pain Inventory Average Pain 7 Pain Right Now 7 My pain is intermittent, sharp, burning, dull, stabbing, tingling and aching  In the last 24 hours, has pain interfered with the following? General activity 7 Relation with others 7 Enjoyment of life 7 What TIME of day is your pain at its worst? night Sleep (in general) Poor  Pain is worse with: bending, sitting, inactivity, standing and some activites Pain improves with: heat/ice, pacing activities, medication and TENS Relief from Meds: 5  Mobility ability to climb steps?  yes do you drive?  yes  Function disabled: date disabled 2002 I need assistance with the following:  dressing, meal prep, household duties and shopping  Neuro/Psych bladder control problems bowel control problems weakness numbness tremor tingling trouble walking spasms dizziness depression anxiety  Prior Studies Any changes since last visit?  no  Physicians involved in your care Any changes since last visit?  no   Family History  Problem Relation Age of Onset  . Coronary artery disease Mother   . Diabetes Mother        borderline  . Kidney disease Mother        stage 4  . Coronary artery disease Father   . Prostate cancer Father   . Lung disease Father        black lung  . Liver cancer Maternal Grandmother   . Liver disease Maternal Grandmother   . Polymyalgia rheumatica Sister   . Heart disease Sister   . Coronary artery disease Brother        heart attack  . COPD Brother   . Colon polyps Sister   . Lung cancer  Paternal Grandmother   . Colon cancer Unknown        Both sides   Social History   Social History  . Marital status: Married    Spouse name: N/A  . Number of children: 1  . Years of education: N/A   Occupational History  . Homemaker    Social History Main Topics  . Smoking status: Former Smoker    Packs/day: 1.00    Years: 42.00    Types: Cigarettes    Quit date: 06/01/2015  . Smokeless tobacco: Never Used     Comment: would like to try chantix, but has not started on it due to warnings related to depression  . Alcohol use No  . Drug use: No  . Sexual activity: Yes   Other Topics Concern  . None   Social History Narrative  . None   Past Surgical History:  Procedure Laterality Date  . ABDOMINAL HYSTERECTOMY     age 10  . ANAL RECTAL MANOMETRY N/A 12/09/2015   Procedure: ANO RECTAL MANOMETRY;  Surgeon: Mauri Pole, MD;  Location: [facility];  Service: Endoscopy;  Laterality: N/A;  . BLADDER REPAIR     sling/with perforation  . CESAREAN SECTION     1 time  . KNEE ARTHROSCOPY Left   . ruptured uterus    .  SHOULDER ARTHROSCOPY     multiple   Past Medical History:  Diagnosis Date  . Anxiety   . Bowel habit changes    excessive gas /frequent BM's with change in shape with pus  . Chronic left shoulder pain    frozen   . Chronic pain    left upper shoulder ,neck and back  . COPD (chronic obstructive pulmonary disease) ([place])    Dr. Redge Gainer  . Depression   . GERD (gastroesophageal reflux disease)   . Hyperlipidemia   . Maternal complication related to childbirth    Sepsis, staph  . Meniere's disease   . Nicotine addiction    2 packs per day   . Rapid or irregular heartbeat   . Skin cancer    Rt. leg, squamous cell  . Tubular adenoma of colon 01/31/2000   Dr. Richmond Campbell  . Weakness of pelvic floor    prolapse -  MRI from 2017   BP (!) 149/87   Pulse 75   SpO2 98%   Opioid Risk Score:   Fall Risk Score:  `1  Depression screen PHQ  2/9  Depression screen [facility] 2/9 06/20/2017 05/16/2017 02/28/2017 01/27/2017 01/11/2017 12/30/2016 09/27/2016  Decreased Interest 1 2 0 3 1 3 3   Down, Depressed, Hopeless 1 1 0 1 2 2 2   PHQ - 2 Score 2 3 0 4 3 5 5   Altered sleeping 2 - - 3 3 3 2   Tired, decreased energy 2 - - 3 3 3 3   Change in appetite 2 - - 3 3 3 2   Feeling bad or failure about yourself  0 - - 1 1 2 1   Trouble concentrating 1 - - 1 1 1 1   Moving slowly or fidgety/restless 1 - - 2 2 1 2   Suicidal thoughts 0 - - 0 0 0 0  PHQ-9 Score 10 - - 17 16 18 16   Difficult doing work/chores - - - - - - -  Some recent data might be hidden     Review of Systems  Constitutional: Positive for appetite change, chills, fever and unexpected weight change.  HENT: Negative.   Eyes: Negative.   Respiratory: Negative.   Cardiovascular: Negative.   Gastrointestinal: Positive for abdominal pain, constipation, diarrhea, nausea and vomiting.  Endocrine: Negative.   Genitourinary: Positive for difficulty urinating and dysuria.  Musculoskeletal: Positive for joint swelling.  Skin: Positive for rash.  Allergic/Immunologic: Negative.   Neurological: Negative.   Hematological: Negative.   Psychiatric/Behavioral: Negative.   All other systems reviewed and are negative.      Objective:   Physical Exam General: Alert and oriented x 3, No apparent distress.  HEENT:Head is normocephalic, atraumatic, PERRLA, EOMI, sclera anicteric, oral mucosa pink and moist, dentition intact, ext ear canals clear,  Neck:Supple without JVD or lymphadenopathy  Heart:RRR without murmur. No JVD  Chest:CTA Bilaterally without wheezes or rales. Normal effort   Abdomen:soft BS+ Extremities:No clubbing, cyanosis, or edema. Pulses are 2+  Skin:visible skin intact Neuro:cognitvely intact. Strength 5/5. Normal sensory  Musculoskeletal:left shoulder remains verylimited in ROM active and passive. Neck affected in rotation and bending to left. Left knee with only  mild swelling. Normal tracking of patella while seated.  Less antalgia, more normal gait this morning.  Psych:Pt's affect is appropriate. Remains pleasant as always.    Assessment & Plan:  1. Post-traumatic arthritis left shoulder s/p 2 shoulder surgeries. Now with adhesive capsulitis left shoulder.  2. Cervicalgia, spondylosis.  3. Right ITB  syndrome  4. Depression/insomnia/anxiety  5. History of right chest pain/tingling----?resolved 6. Bilateral knee pain, Left > right. ?likely meniscal/soft tissue injury   Plan:  1. Continue TENS to shoulder and neck  2.  Knee exercises shall continue 3. Gabapentin may continue  4. Continue diclofenac 75mg  bid. May use heat and ice as part of mgt as well.  5. Consider knee injections, MRI based on course. Not needed at this time. 6. Refilled ms IR 15mg  #30.Will not increase at this time, as it's tolerated and fairly effective.  We will continue the opioid monitoring program, this consists of regular clinic visits, examinations, routine drug screening, pill counts as well as use of [place] Controlled Substance Reporting System. [place] was [place] today.   7.HEP for neck 8. Lexapro for depression/anxiety at 20mg  daily  9.  I or NP will see her back in about 1 months. 21minutes of face to face patient care time were spent during this visit. All questions were encouraged and answered.   Marland Kitchen  Marland Kitchen

## 2017-07-13 ENCOUNTER — Telehealth: Payer: Self-pay | Admitting: Family Medicine

## 2017-07-13 NOTE — Telephone Encounter (Signed)
Spoke to pharmacist and verified pt is on both Xanax and Morphine and Dr Livia Snellen aware and ok to fill both.

## 2017-07-19 ENCOUNTER — Telehealth: Payer: Self-pay | Admitting: Family Medicine

## 2017-08-07 ENCOUNTER — Other Ambulatory Visit: Payer: Self-pay | Admitting: Physical Medicine & Rehabilitation

## 2017-08-11 ENCOUNTER — Other Ambulatory Visit: Payer: Self-pay | Admitting: Family Medicine

## 2017-08-11 ENCOUNTER — Telehealth: Payer: Self-pay | Admitting: Family Medicine

## 2017-08-11 MED ORDER — SULFAMETHOXAZOLE-TRIMETHOPRIM 800-160 MG PO TABS: 1.0000 | ORAL_TABLET | Freq: Two times a day (BID) | ORAL | 0 refills | Status: DC

## 2017-08-11 MED ORDER — CIPROFLOXACIN HCL 250 MG PO TABS: 250.0000 mg | ORAL_TABLET | Freq: Two times a day (BID) | ORAL | 0 refills | Status: DC

## 2017-08-11 NOTE — Telephone Encounter (Signed)
Pt c/o dysuria. Pt has pus in urine smells like metal. Patient denies fever and blood. Been going on x 2 weeks. Pt is asking for Abx called to [facility] in [place]

## 2017-08-11 NOTE — Telephone Encounter (Signed)
I sent in the requested prescription 

## 2017-08-14 ENCOUNTER — Encounter: Payer: Medicare Other | Admitting: Physical Medicine & Rehabilitation

## 2017-08-14 NOTE — Telephone Encounter (Signed)
Left detailed message on patients voicemail that rx was sent in

## 2017-08-22 ENCOUNTER — Other Ambulatory Visit: Payer: Self-pay

## 2017-08-22 ENCOUNTER — Encounter: Payer: Medicare Other | Attending: Physical Medicine & Rehabilitation | Admitting: Physical Medicine & Rehabilitation

## 2017-08-22 ENCOUNTER — Encounter: Payer: Self-pay | Admitting: Physical Medicine & Rehabilitation

## 2017-08-22 DIAGNOSIS — M47812 Spondylosis without myelopathy or radiculopathy, cervical region: Secondary | ICD-10-CM

## 2017-08-22 DIAGNOSIS — M7631 Iliotibial band syndrome, right leg: Secondary | ICD-10-CM | POA: Insufficient documentation

## 2017-08-22 DIAGNOSIS — M19012 Primary osteoarthritis, left shoulder: Secondary | ICD-10-CM | POA: Insufficient documentation

## 2017-08-22 DIAGNOSIS — Z801 Family history of malignant neoplasm of trachea, bronchus and lung: Secondary | ICD-10-CM | POA: Insufficient documentation

## 2017-08-22 DIAGNOSIS — E785 Hyperlipidemia, unspecified: Secondary | ICD-10-CM | POA: Diagnosis not present

## 2017-08-22 DIAGNOSIS — Z8371 Family history of colonic polyps: Secondary | ICD-10-CM | POA: Insufficient documentation

## 2017-08-22 DIAGNOSIS — Z85828 Personal history of other malignant neoplasm of skin: Secondary | ICD-10-CM | POA: Diagnosis not present

## 2017-08-22 DIAGNOSIS — F329 Major depressive disorder, single episode, unspecified: Secondary | ICD-10-CM | POA: Insufficient documentation

## 2017-08-22 DIAGNOSIS — G4700 Insomnia, unspecified: Secondary | ICD-10-CM | POA: Diagnosis not present

## 2017-08-22 DIAGNOSIS — Z8042 Family history of malignant neoplasm of prostate: Secondary | ICD-10-CM | POA: Insufficient documentation

## 2017-08-22 DIAGNOSIS — Z87891 Personal history of nicotine dependence: Secondary | ICD-10-CM | POA: Insufficient documentation

## 2017-08-22 DIAGNOSIS — Z825 Family history of asthma and other chronic lower respiratory diseases: Secondary | ICD-10-CM | POA: Diagnosis not present

## 2017-08-22 DIAGNOSIS — M25562 Pain in left knee: Secondary | ICD-10-CM | POA: Insufficient documentation

## 2017-08-22 DIAGNOSIS — Z8249 Family history of ischemic heart disease and other diseases of the circulatory system: Secondary | ICD-10-CM | POA: Insufficient documentation

## 2017-08-22 DIAGNOSIS — M7502 Adhesive capsulitis of left shoulder: Secondary | ICD-10-CM | POA: Insufficient documentation

## 2017-08-22 DIAGNOSIS — M19112 Post-traumatic osteoarthritis, left shoulder: Secondary | ICD-10-CM

## 2017-08-22 DIAGNOSIS — G8929 Other chronic pain: Secondary | ICD-10-CM | POA: Insufficient documentation

## 2017-08-22 DIAGNOSIS — M25561 Pain in right knee: Secondary | ICD-10-CM | POA: Diagnosis not present

## 2017-08-22 DIAGNOSIS — F419 Anxiety disorder, unspecified: Secondary | ICD-10-CM | POA: Diagnosis not present

## 2017-08-22 DIAGNOSIS — K219 Gastro-esophageal reflux disease without esophagitis: Secondary | ICD-10-CM | POA: Insufficient documentation

## 2017-08-22 DIAGNOSIS — Z833 Family history of diabetes mellitus: Secondary | ICD-10-CM | POA: Diagnosis not present

## 2017-08-22 DIAGNOSIS — Z841 Family history of disorders of kidney and ureter: Secondary | ICD-10-CM | POA: Insufficient documentation

## 2017-08-22 DIAGNOSIS — Z90710 Acquired absence of both cervix and uterus: Secondary | ICD-10-CM | POA: Diagnosis not present

## 2017-08-22 DIAGNOSIS — J449 Chronic obstructive pulmonary disease, unspecified: Secondary | ICD-10-CM | POA: Insufficient documentation

## 2017-08-22 MED ORDER — MORPHINE SULFATE 15 MG PO TABS: ORAL_TABLET | ORAL | 0 refills | Status: DC

## 2017-08-22 NOTE — Patient Instructions (Signed)
PLEASE FEEL FREE TO CALL OUR OFFICE WITH ANY PROBLEMS OR QUESTIONS ([phone])  HAVE A HAPPY HOLIDAYS!                     ^                  ^^                ^ ^ ^             ^ ^ ^ ^ ^           ^ ^ ^ ^ ^ ^ ^        ^ ^ ^ ^ ^ ^ ^ ^ ^      ^ ^ ^ ^ ^ ^ ^ ^ ^ ^ ^                ^^^^                ^^^^                ^^^^     

## 2017-08-22 NOTE — Progress Notes (Signed)
Subjective:    Patient ID: Cassandra [name], female    DOB: Jun 28, 1956, 61 y.o.   MRN: [id number]  HPI   Cassandra [name] is here in follow up of her chronic pain. She states that the exercises are helping her knee pain.  She is doing them most days.  She remains on the morphine for pain control which provides relief as well.  She struggles with IBS but has found a combination of oatmeal and bananas has been very beneficial.  She is having fairly regular bowel movements now.  She is weaned herself off of Ambien essentially and only uses 1/2-1/4 tablet when needed.  She has tried other remedies as well to help with sleep.  She is tried melatonin in the past   Pain Inventory Average Pain 7 Pain Right Now 6 My pain is sharp, burning, dull, stabbing, tingling and aching  In the last 24 hours, has pain interfered with the following? General activity 7 Relation with others 5 Enjoyment of life 7 What TIME of day is your pain at its worst? morning and night Sleep (in general) Poor  Pain is worse with: bending, sitting, inactivity, standing and some activites Pain improves with: rest, heat/ice, pacing activities, medication and TENS Relief from Meds: 5  Mobility walk without assistance ability to climb steps?  yes do you drive?  yes needs help with transfers  Function not employed: date last employed 2005/2007 disabled: date disabled . I need assistance with the following:  dressing, household duties and shopping  Neuro/Psych bladder control problems bowel control problems weakness numbness tremor tingling spasms dizziness anxiety  Prior Studies Any changes since last visit?  no  Physicians involved in your care Any changes since last visit?  no   Family History  Problem Relation Age of Onset  . Coronary artery disease Mother   . Diabetes Mother        borderline  . Kidney disease Mother        stage 4  . Coronary artery disease Father   . Prostate cancer Father   . Lung  disease Father        black lung  . Liver cancer Maternal Grandmother   . Liver disease Maternal Grandmother   . Polymyalgia rheumatica Sister   . Heart disease Sister   . Coronary artery disease Brother        heart attack  . COPD Brother   . Colon polyps Sister   . Lung cancer Paternal Grandmother   . Colon cancer Unknown        Both sides   Social History   Socioeconomic History  . Marital status: Married    Spouse name: Not on file  . Number of children: 1  . Years of education: Not on file  . Highest education level: Not on file  Social Needs  . Financial resource strain: Not on file  . Food insecurity - worry: Not on file  . Food insecurity - inability: Not on file  . Transportation needs - medical: Not on file  . Transportation needs - non-medical: Not on file  Occupational History  . Occupation: Homemaker  Tobacco Use  . Smoking status: Former Smoker    Packs/day: 1.00    Years: 42.00    Pack years: 42.00    Types: Cigarettes    Last attempt to quit: 06/01/2015    Years since quitting: 2.2  . Smokeless tobacco: Never Used  . Tobacco comment: would like to try chantix,  but has not started on it due to warnings related to depression  Substance and Sexual Activity  . Alcohol use: No    Alcohol/week: 0.0 oz  . Drug use: No  . Sexual activity: Yes  Other Topics Concern  . Not on file  Social History Narrative  . Not on file   Past Surgical History:  Procedure Laterality Date  . ABDOMINAL HYSTERECTOMY     age 64  . ANAL RECTAL MANOMETRY N/A 12/09/2015   Procedure: ANO RECTAL MANOMETRY;  Surgeon: Mauri Pole, MD;  Location: [facility];  Service: Endoscopy;  Laterality: N/A;  . BLADDER REPAIR     sling/with perforation  . CESAREAN SECTION     1 time  . KNEE ARTHROSCOPY Left   . ruptured uterus    . SHOULDER ARTHROSCOPY     multiple   Past Medical History:  Diagnosis Date  . Anxiety   . Bowel habit changes    excessive gas /frequent BM's with  change in shape with pus  . Chronic left shoulder pain    frozen   . Chronic pain    left upper shoulder ,neck and back  . COPD (chronic obstructive pulmonary disease) ([place])    Dr. Redge Gainer  . Depression   . GERD (gastroesophageal reflux disease)   . Hyperlipidemia   . Maternal complication related to childbirth    Sepsis, staph  . Meniere's disease   . Nicotine addiction    2 packs per day   . Rapid or irregular heartbeat   . Skin cancer    Rt. leg, squamous cell  . Tubular adenoma of colon 01/31/2000   Dr. Richmond Campbell  . Weakness of pelvic floor    prolapse -  MRI from 2017   There were no vitals taken for this visit.  Opioid Risk Score:  1 Fall Risk Score:  `1  Depression screen PHQ 2/9  Depression screen [facility] 2/9 08/22/2017 06/20/2017 05/16/2017 02/28/2017 01/27/2017 01/11/2017 12/30/2016  Decreased Interest 0 1 2 0 3 1 3   Down, Depressed, Hopeless 0 1 1 0 1 2 2   PHQ - 2 Score 0 2 3 0 4 3 5   Altered sleeping - 2 - - 3 3 3   Tired, decreased energy - 2 - - 3 3 3   Change in appetite - 2 - - 3 3 3   Feeling bad or failure about yourself  - 0 - - 1 1 2   Trouble concentrating - 1 - - 1 1 1   Moving slowly or fidgety/restless - 1 - - 2 2 1   Suicidal thoughts - 0 - - 0 0 0  PHQ-9 Score - 10 - - 17 16 18   Difficult doing work/chores - - - - - - -  Some recent data might be hidden      Review of Systems  Constitutional: Positive for fever and unexpected weight change.       Night sweats  HENT: Negative.   Eyes: Negative.   Respiratory: Positive for shortness of breath.   Cardiovascular: Positive for leg swelling.  Gastrointestinal: Positive for diarrhea, nausea and vomiting.  Endocrine: Negative.   Genitourinary: Negative.   Musculoskeletal: Negative.   Skin: Negative.   Allergic/Immunologic: Negative.   Neurological: Negative.   Hematological: Negative.   Psychiatric/Behavioral: Negative.        Objective:   Physical Exam  General: Alert and oriented x 3,  No apparent distress.  HEENT:Head is normocephalic, atraumatic, PERRLA, EOMI, sclera anicteric, oral  mucosa pink and moist, dentition intact, ext ear canals clear,  Neck:Supple without JVD or lymphadenopathy  Heart:Regular rate Chest:Normal effortAbdomen:soft BS+ Extremities:No clubbing, cyanosis, or edema. Pulses are 2+  Skin:visible skin intact Neuro:cognitvely intact. Strength 5/5. Normal sensory Musculoskeletal:left shoulder remains verylimited in ROM active and passive per baseline. Neck affected in rotation and bending to left.  Left knee with only mild pain along the joint line.  Minimal antalgia today.   Psych:Pt's affect is appropriate. Remains pleasant as always.    Assessment & Plan:  1. Post-traumatic arthritis left shoulder s/p 2 shoulder surgeries. Now with adhesive capsulitis left shoulder.  2. Cervicalgia, spondylosis.  3. Right ITB syndrome  4. Depression/insomnia/anxiety  5. History of right chest pain/tingling----?resolved 6. Bilateral knee pain, Left > right. ?likely meniscal/soft tissue injury   Plan:  1. Continue TENS to shoulder and neck 2.   Continue with home exercise program for left knee.  This seems to be working for her. 3. Gabapentin may continue 4. Continue diclofenac 75mg  bid. May use heat and ice as part of mgt as well.  5. Consider knee injections, MRI based on course.  Not indicated at this point 6. Refilled ms IR 15mg  #30.second prescription provided for next month. We will continue the opioid monitoring program, this consists of regular clinic visits, examinations, routine drug screening, pill counts as well as use of [place] Controlled Substance Reporting System. [place] was reviewed today.     7.HEP for neck 8. Lexapro for depression/anxiety at 20mg  daily.  Continue 9. I or NP will see her back in about 2 months. 9minutes of face to face patient care time were spent during this visit. All questions were encouraged  and answered.   Marland Kitchen Marland Kitchen

## 2017-08-30 ENCOUNTER — Other Ambulatory Visit: Payer: Self-pay | Admitting: Registered Nurse

## 2017-09-01 ENCOUNTER — Other Ambulatory Visit: Payer: Self-pay | Admitting: Family Medicine

## 2017-09-01 DIAGNOSIS — B379 Candidiasis, unspecified: Secondary | ICD-10-CM

## 2017-09-06 NOTE — Telephone Encounter (Signed)
Last seen 06/20/17  Dr Livia Snellen

## 2017-09-23 ENCOUNTER — Telehealth: Payer: Self-pay

## 2017-10-10 DIAGNOSIS — Z1231 Encounter for screening mammogram for malignant neoplasm of breast: Secondary | ICD-10-CM | POA: Diagnosis not present

## 2017-10-23 ENCOUNTER — Encounter: Payer: Medicare Other | Attending: Physical Medicine & Rehabilitation | Admitting: Physical Medicine & Rehabilitation

## 2017-10-23 ENCOUNTER — Encounter: Payer: Self-pay | Admitting: Physical Medicine & Rehabilitation

## 2017-10-23 VITALS — BP 153/84 | HR 75

## 2017-10-23 DIAGNOSIS — Z90710 Acquired absence of both cervix and uterus: Secondary | ICD-10-CM | POA: Insufficient documentation

## 2017-10-23 DIAGNOSIS — Z8042 Family history of malignant neoplasm of prostate: Secondary | ICD-10-CM | POA: Insufficient documentation

## 2017-10-23 DIAGNOSIS — M25562 Pain in left knee: Secondary | ICD-10-CM | POA: Insufficient documentation

## 2017-10-23 DIAGNOSIS — M19012 Primary osteoarthritis, left shoulder: Secondary | ICD-10-CM | POA: Diagnosis not present

## 2017-10-23 DIAGNOSIS — G4700 Insomnia, unspecified: Secondary | ICD-10-CM | POA: Insufficient documentation

## 2017-10-23 DIAGNOSIS — M47812 Spondylosis without myelopathy or radiculopathy, cervical region: Secondary | ICD-10-CM | POA: Insufficient documentation

## 2017-10-23 DIAGNOSIS — G8929 Other chronic pain: Secondary | ICD-10-CM | POA: Insufficient documentation

## 2017-10-23 DIAGNOSIS — Z801 Family history of malignant neoplasm of trachea, bronchus and lung: Secondary | ICD-10-CM | POA: Insufficient documentation

## 2017-10-23 DIAGNOSIS — M25561 Pain in right knee: Secondary | ICD-10-CM | POA: Diagnosis not present

## 2017-10-23 DIAGNOSIS — Z825 Family history of asthma and other chronic lower respiratory diseases: Secondary | ICD-10-CM | POA: Insufficient documentation

## 2017-10-23 DIAGNOSIS — F419 Anxiety disorder, unspecified: Secondary | ICD-10-CM | POA: Diagnosis not present

## 2017-10-23 DIAGNOSIS — Z8371 Family history of colonic polyps: Secondary | ICD-10-CM | POA: Insufficient documentation

## 2017-10-23 DIAGNOSIS — M7631 Iliotibial band syndrome, right leg: Secondary | ICD-10-CM | POA: Diagnosis not present

## 2017-10-23 DIAGNOSIS — Z87891 Personal history of nicotine dependence: Secondary | ICD-10-CM | POA: Diagnosis not present

## 2017-10-23 DIAGNOSIS — M19112 Post-traumatic osteoarthritis, left shoulder: Secondary | ICD-10-CM

## 2017-10-23 DIAGNOSIS — M7502 Adhesive capsulitis of left shoulder: Secondary | ICD-10-CM | POA: Diagnosis not present

## 2017-10-23 DIAGNOSIS — J449 Chronic obstructive pulmonary disease, unspecified: Secondary | ICD-10-CM | POA: Insufficient documentation

## 2017-10-23 DIAGNOSIS — E785 Hyperlipidemia, unspecified: Secondary | ICD-10-CM | POA: Diagnosis not present

## 2017-10-23 DIAGNOSIS — F329 Major depressive disorder, single episode, unspecified: Secondary | ICD-10-CM | POA: Diagnosis not present

## 2017-10-23 DIAGNOSIS — Z833 Family history of diabetes mellitus: Secondary | ICD-10-CM | POA: Insufficient documentation

## 2017-10-23 DIAGNOSIS — Z85828 Personal history of other malignant neoplasm of skin: Secondary | ICD-10-CM | POA: Diagnosis not present

## 2017-10-23 DIAGNOSIS — K219 Gastro-esophageal reflux disease without esophagitis: Secondary | ICD-10-CM | POA: Insufficient documentation

## 2017-10-23 DIAGNOSIS — Z841 Family history of disorders of kidney and ureter: Secondary | ICD-10-CM | POA: Insufficient documentation

## 2017-10-23 DIAGNOSIS — Z8249 Family history of ischemic heart disease and other diseases of the circulatory system: Secondary | ICD-10-CM | POA: Insufficient documentation

## 2017-10-23 MED ORDER — HYDROCODONE-ACETAMINOPHEN 5-325 MG PO TABS
1.0000 | ORAL_TABLET | Freq: Every day | ORAL | 0 refills | Status: DC | PRN
Start: 2017-10-23 — End: 2017-11-20

## 2017-10-23 NOTE — Patient Instructions (Signed)
BEL[place] IS PAIN MEDICATION WHICH IS A FILM YOU PLACE ON YOUR CHEEK. ?INSURANCE [facility]   PINS-----STIMULATOR UNIT

## 2017-10-23 NOTE — Progress Notes (Signed)
Subjective:    Patient ID: Cassandra [name], female    DOB: [birth year], 62 y.o.   MRN: [id number]  HPI   Cassandra [name] is here in follow up of her chronic pain. She realized that she was having rash and N/V from the MS contin IR and has essentially "weaned herself" off. She has gone through some withdraw symptoms as a result.   She is disappointed because she has had good pain relief results with the MS immediate release.  She had several medication intolerances in general.  Pain remains most severe in her left shoulder and neck.  The knee has not been bothering her quite as much as of late.  Pain Inventory Average Pain 8 Pain Right Now 8 My pain is sharp, burning, dull, stabbing, tingling and aching  In the last 24 hours, has pain interfered with the following? General activity 9 Relation with others 7 Enjoyment of life 7 What TIME of day is your pain at its worst? night Sleep (in general) Poor  Pain is worse with: bending, sitting, inactivity and standing Pain improves with: rest, heat/ice and pacing activities Relief from Meds: 6  Mobility walk without assistance ability to climb steps?  yes do you drive?  yes  Function not employed: date last employed 2007 disabled: date disabled 2007  Neuro/Psych bladder control problems bowel control problems weakness numbness tremor tingling spasms dizziness anxiety  Prior Studies Any changes since last visit?  no  Physicians involved in your care Any changes since last visit?  no   Family History  Problem Relation Age of Onset  . Coronary artery disease Mother   . Diabetes Mother        borderline  . Kidney disease Mother        stage 4  . Coronary artery disease Father   . Prostate cancer Father   . Lung disease Father        black lung  . Liver cancer Maternal Grandmother   . Liver disease Maternal Grandmother   . Polymyalgia rheumatica Sister   . Heart disease Sister   . Coronary artery disease Brother    heart attack  . COPD Brother   . Colon polyps Sister   . Lung cancer Paternal Grandmother   . Colon cancer Unknown        Both sides   Social History   Socioeconomic History  . Marital status: Married    Spouse name: Not on file  . Number of children: 1  . Years of education: Not on file  . Highest education level: Not on file  Social Needs  . Financial resource strain: Not on file  . Food insecurity - worry: Not on file  . Food insecurity - inability: Not on file  . Transportation needs - medical: Not on file  . Transportation needs - non-medical: Not on file  Occupational History  . Occupation: Homemaker  Tobacco Use  . Smoking status: Former Smoker    Packs/day: 1.00    Years: 42.00    Pack years: 42.00    Types: Cigarettes    Last attempt to quit: 06/01/2015    Years since quitting: 2.3  . Smokeless tobacco: Never Used  . Tobacco comment: would like to try chantix, but has not started on it due to warnings related to depression  Substance and Sexual Activity  . Alcohol use: No    Alcohol/week: 0.0 oz  . Drug use: No  . Sexual activity: Yes  Other Topics  Concern  . Not on file  Social History Narrative  . Not on file   Past Surgical History:  Procedure Laterality Date  . ABDOMINAL HYSTERECTOMY     age 66  . ANAL RECTAL MANOMETRY N/A 12/09/2015   Procedure: ANO RECTAL MANOMETRY;  Surgeon: Mauri Pole, MD;  Location: [facility];  Service: Endoscopy;  Laterality: N/A;  . BLADDER REPAIR     sling/with perforation  . CESAREAN SECTION     1 time  . KNEE ARTHROSCOPY Left   . ruptured uterus    . SHOULDER ARTHROSCOPY     multiple   Past Medical History:  Diagnosis Date  . Anxiety   . Bowel habit changes    excessive gas /frequent BM's with change in shape with pus  . Chronic left shoulder pain    frozen   . Chronic pain    left upper shoulder ,neck and back  . COPD (chronic obstructive pulmonary disease) ([place])    Dr. Redge Gainer  . Depression     . GERD (gastroesophageal reflux disease)   . Hyperlipidemia   . Maternal complication related to childbirth    Sepsis, staph  . Meniere's disease   . Nicotine addiction    2 packs per day   . Rapid or irregular heartbeat   . Skin cancer    Rt. leg, squamous cell  . Tubular adenoma of colon 01/31/2000   Dr. Richmond Campbell  . Weakness of pelvic floor    prolapse -  MRI from 2017   There were no vitals taken for this visit.  Opioid Risk Score:   Fall Risk Score:  `1  Depression screen PHQ 2/9  Depression screen [facility] 2/9 08/22/2017 06/20/2017 05/16/2017 02/28/2017 01/27/2017 01/11/2017 12/30/2016  Decreased Interest 0 1 2 0 3 1 3   Down, Depressed, Hopeless 0 1 1 0 1 2 2   PHQ - 2 Score 0 2 3 0 4 3 5   Altered sleeping - 2 - - 3 3 3   Tired, decreased energy - 2 - - 3 3 3   Change in appetite - 2 - - 3 3 3   Feeling bad or failure about yourself  - 0 - - 1 1 2   Trouble concentrating - 1 - - 1 1 1   Moving slowly or fidgety/restless - 1 - - 2 2 1   Suicidal thoughts - 0 - - 0 0 0  PHQ-9 Score - 10 - - 17 16 18   Difficult doing work/chores - - - - - - -  Some recent data might be hidden     Review of Systems  Constitutional: Positive for appetite change, chills, diaphoresis and fever.  HENT: Negative.   Eyes: Negative.   Respiratory: Negative.   Cardiovascular: Negative.   Gastrointestinal: Positive for abdominal pain, constipation, diarrhea, nausea and vomiting.  Endocrine: Negative.   Genitourinary: Positive for difficulty urinating.  Musculoskeletal: Positive for joint swelling.  Skin: Positive for rash.  Allergic/Immunologic: Negative.   Neurological: Negative.   Hematological: Negative.   Psychiatric/Behavioral: Negative.   All other systems reviewed and are negative.      Objective:   Physical Exam General: Alert and oriented x 3, No apparent distress.  HEENT:Head is normocephalic, atraumatic, PERRLA, EOMI, sclera anicteric, oral mucosa pink and moist, dentition  intact, ext ear canals clear,  Neck:Supple without JVD or lymphadenopathy  Heart:RRR Chest:CTA Abdomen:soft BS+ Extremities:No clubbing, cyanosis, or edema. Pulses are 2+  Skin:visible skin intact Neuro:cognitvely intact. Strength 5/5. Normal sensory Musculoskeletal:left  shoulder remains verylimited in ROM active and passive per baseline. Neck affected in rotation and bending to left.  mild left sided antalgia Psych:Pt's affect is appropriate. Remains pleasant as always.    Assessment & Plan:  1. Post-traumatic arthritis left shoulder s/p 2 shoulder surgeries. Now with adhesive capsulitis left shoulder.  2. Cervicalgia, spondylosis.  3. Right ITB syndrome  4. Depression/insomnia/anxiety  5. History of right chest pain/tingling----?resolved 6. Bilateral knee pain, Left > right. ?likely meniscal/soft tissue injury 7. Multiple drug allergies   Plan:  1. Continue TENS to shoulder and neck. Discussed PINS today with patient again---may be a candidate.  2.Continue with home exercise program for left knee.  This seems to be working for her. 3. Gabapentin may continue 4.Continuediclofenac 75mg  bid. May use heat and ice as part of mgt as well. 5. Knee pain stable 6. Hydrocodone 5/325 one qday prn in place of MS IR. We will continue the opioid monitoring program, this consists of regular clinic visits, examinations, routine drug screening, pill counts as well as use of [place] Controlled Substance Reporting System. [place] was reviewed today.  [place] trial? 7.HEP for neck 8. Lexapro for depression/anxiety at 20mg  daily.  Continue 9. I or NP will see her back in about 1 months. 73minutes of face to face patient care time were spent during this visit. All questions were encouraged and answered. Marland Kitchen Marland Kitchen

## 2017-11-06 ENCOUNTER — Other Ambulatory Visit: Payer: Self-pay | Admitting: Family Medicine

## 2017-11-06 DIAGNOSIS — B379 Candidiasis, unspecified: Secondary | ICD-10-CM

## 2017-11-07 NOTE — Telephone Encounter (Signed)
Last seen 06/20/17  Dr Livia Snellen

## 2017-11-20 ENCOUNTER — Encounter: Payer: Self-pay | Admitting: Physical Medicine & Rehabilitation

## 2017-11-20 ENCOUNTER — Encounter: Payer: Medicare Other | Attending: Physical Medicine & Rehabilitation | Admitting: Physical Medicine & Rehabilitation

## 2017-11-20 VITALS — BP 152/82 | HR 64 | Resp 14

## 2017-11-20 DIAGNOSIS — Z833 Family history of diabetes mellitus: Secondary | ICD-10-CM | POA: Diagnosis not present

## 2017-11-20 DIAGNOSIS — K219 Gastro-esophageal reflux disease without esophagitis: Secondary | ICD-10-CM | POA: Diagnosis not present

## 2017-11-20 DIAGNOSIS — N390 Urinary tract infection, site not specified: Secondary | ICD-10-CM | POA: Diagnosis not present

## 2017-11-20 DIAGNOSIS — Z841 Family history of disorders of kidney and ureter: Secondary | ICD-10-CM | POA: Insufficient documentation

## 2017-11-20 DIAGNOSIS — M7502 Adhesive capsulitis of left shoulder: Secondary | ICD-10-CM

## 2017-11-20 DIAGNOSIS — Z8042 Family history of malignant neoplasm of prostate: Secondary | ICD-10-CM | POA: Insufficient documentation

## 2017-11-20 DIAGNOSIS — Z85828 Personal history of other malignant neoplasm of skin: Secondary | ICD-10-CM | POA: Diagnosis not present

## 2017-11-20 DIAGNOSIS — F329 Major depressive disorder, single episode, unspecified: Secondary | ICD-10-CM | POA: Insufficient documentation

## 2017-11-20 DIAGNOSIS — Z825 Family history of asthma and other chronic lower respiratory diseases: Secondary | ICD-10-CM | POA: Diagnosis not present

## 2017-11-20 DIAGNOSIS — M25561 Pain in right knee: Secondary | ICD-10-CM | POA: Diagnosis not present

## 2017-11-20 DIAGNOSIS — G4700 Insomnia, unspecified: Secondary | ICD-10-CM | POA: Insufficient documentation

## 2017-11-20 DIAGNOSIS — Z801 Family history of malignant neoplasm of trachea, bronchus and lung: Secondary | ICD-10-CM | POA: Diagnosis not present

## 2017-11-20 DIAGNOSIS — A499 Bacterial infection, unspecified: Secondary | ICD-10-CM

## 2017-11-20 DIAGNOSIS — J449 Chronic obstructive pulmonary disease, unspecified: Secondary | ICD-10-CM | POA: Diagnosis not present

## 2017-11-20 DIAGNOSIS — E785 Hyperlipidemia, unspecified: Secondary | ICD-10-CM | POA: Insufficient documentation

## 2017-11-20 DIAGNOSIS — M25562 Pain in left knee: Secondary | ICD-10-CM | POA: Insufficient documentation

## 2017-11-20 DIAGNOSIS — M47812 Spondylosis without myelopathy or radiculopathy, cervical region: Secondary | ICD-10-CM | POA: Insufficient documentation

## 2017-11-20 DIAGNOSIS — F419 Anxiety disorder, unspecified: Secondary | ICD-10-CM | POA: Insufficient documentation

## 2017-11-20 DIAGNOSIS — Z8371 Family history of colonic polyps: Secondary | ICD-10-CM | POA: Diagnosis not present

## 2017-11-20 DIAGNOSIS — M19012 Primary osteoarthritis, left shoulder: Secondary | ICD-10-CM | POA: Insufficient documentation

## 2017-11-20 DIAGNOSIS — G8929 Other chronic pain: Secondary | ICD-10-CM | POA: Insufficient documentation

## 2017-11-20 DIAGNOSIS — Z90710 Acquired absence of both cervix and uterus: Secondary | ICD-10-CM | POA: Diagnosis not present

## 2017-11-20 DIAGNOSIS — Z87891 Personal history of nicotine dependence: Secondary | ICD-10-CM | POA: Insufficient documentation

## 2017-11-20 DIAGNOSIS — M19112 Post-traumatic osteoarthritis, left shoulder: Secondary | ICD-10-CM

## 2017-11-20 DIAGNOSIS — M7631 Iliotibial band syndrome, right leg: Secondary | ICD-10-CM | POA: Diagnosis not present

## 2017-11-20 DIAGNOSIS — Z8249 Family history of ischemic heart disease and other diseases of the circulatory system: Secondary | ICD-10-CM | POA: Diagnosis not present

## 2017-11-20 MED ORDER — GABAPENTIN 300 MG PO CAPS: 300.0000 mg | ORAL_CAPSULE | Freq: Three times a day (TID) | ORAL | 3 refills | Status: DC

## 2017-11-20 MED ORDER — HYDROCODONE-ACETAMINOPHEN 5-325 MG PO TABS: 1.0000 | ORAL_TABLET | Freq: Every day | ORAL | 0 refills | Status: DC | PRN

## 2017-11-20 NOTE — Patient Instructions (Signed)
PLEASE FEEL FREE TO CALL OUR OFFICE WITH ANY PROBLEMS OR QUESTIONS ([phone])      

## 2017-11-20 NOTE — Progress Notes (Signed)
Subjective:    Patient ID: Cassandra [name], female    DOB: [birth year], 62 y.o.   MRN: [id number]  HPI   Cassandra [name] is here in follow up of her chronic pain. She states that since I last saw her she came to the realization that she wasn't going to let her past get the best of her mood. She decided to "throw it away". Since then she's felt much better from an anxiety and depression standpoint. She has felt brighter as a whole. Her faith has helped also. It may have helped her cope with her pain better as well or at least have realistic expectations.   For pain she's using hydrocodone as well heat, ice, TENS, etc. The hydrocodone causes some itching, but she can tolerate that side effect  She is also using more gabapentin, often 300-400mg  BID prn. She feels that the gabapentin may be helping but that she doesn't want to get "hooked" on taking it.   Incidentally, Cassandra [name] noted that her urine has been very dark in color and that she's not putting out a lot of urine. I asked if she has been trying to drink more, and she says she's not drinking enough. She thinks that she might have a UTI. Cassandra [name] has seen urology in the past for incontinence and had some procedures done, but she hasn't been to them for some time. She has been on prophylactic macrodantin in the past.       Pain Inventory Average Pain 7 Pain Right Now 7 My pain is constant, sharp, burning, dull, stabbing, tingling and aching  In the last 24 hours, has pain interfered with the following? General activity 7 Relation with others 7 Enjoyment of life 7 What TIME of day is your pain at its worst? mornig, evening, night Sleep (in general) Poor  Pain is worse with: bending, sitting, inactivity and some activites Pain improves with: rest, heat/ice, pacing activities, medication and TENS Relief from Meds: 6  Mobility walk without assistance ability to climb steps?  yes do you drive?  yes needs help with transfers  Function disabled: date  disabled . I need assistance with the following:  meal prep, household duties and shopping  Neuro/Psych bladder control problems bowel control problems weakness numbness tremor tingling spasms dizziness  Prior Studies Any changes since last visit?  no  Physicians involved in your care Any changes since last visit?  no   Family History  Problem Relation Age of Onset  . Coronary artery disease Mother   . Diabetes Mother        borderline  . Kidney disease Mother        stage 4  . Coronary artery disease Father   . Prostate cancer Father   . Lung disease Father        black lung  . Liver cancer Maternal Grandmother   . Liver disease Maternal Grandmother   . Polymyalgia rheumatica Sister   . Heart disease Sister   . Coronary artery disease Brother        heart attack  . COPD Brother   . Colon polyps Sister   . Lung cancer Paternal Grandmother   . Colon cancer Unknown        Both sides   Social History   Socioeconomic History  . Marital status: Married    Spouse name: None  . Number of children: 1  . Years of education: None  . Highest education level: None  Social Needs  .  Financial resource strain: None  . Food insecurity - worry: None  . Food insecurity - inability: None  . Transportation needs - medical: None  . Transportation needs - non-medical: None  Occupational History  . Occupation: Homemaker  Tobacco Use  . Smoking status: Former Smoker    Packs/day: 1.00    Years: 42.00    Pack years: 42.00    Types: Cigarettes    Last attempt to quit: 06/01/2015    Years since quitting: 2.4  . Smokeless tobacco: Never Used  . Tobacco comment: would like to try chantix, but has not started on it due to warnings related to depression  Substance and Sexual Activity  . Alcohol use: No    Alcohol/week: 0.0 oz  . Drug use: No  . Sexual activity: Yes  Other Topics Concern  . None  Social History Narrative  . None   Past Surgical History:  Procedure  Laterality Date  . ABDOMINAL HYSTERECTOMY     age 2  . ANAL RECTAL MANOMETRY N/A 12/09/2015   Procedure: ANO RECTAL MANOMETRY;  Surgeon: Mauri Pole, MD;  Location: [facility];  Service: Endoscopy;  Laterality: N/A;  . BLADDER REPAIR     sling/with perforation  . CESAREAN SECTION     1 time  . KNEE ARTHROSCOPY Left   . ruptured uterus    . SHOULDER ARTHROSCOPY     multiple   Past Medical History:  Diagnosis Date  . Anxiety   . Bowel habit changes    excessive gas /frequent BM's with change in shape with pus  . Chronic left shoulder pain    frozen   . Chronic pain    left upper shoulder ,neck and back  . COPD (chronic obstructive pulmonary disease) ([place])    Dr. Redge Gainer  . Depression   . GERD (gastroesophageal reflux disease)   . Hyperlipidemia   . Maternal complication related to childbirth    Sepsis, staph  . Meniere's disease   . Nicotine addiction    2 packs per day   . Rapid or irregular heartbeat   . Skin cancer    Rt. leg, squamous cell  . Tubular adenoma of colon 01/31/2000   Dr. Richmond Campbell  . Weakness of pelvic floor    prolapse -  MRI from 2017   BP (!) 152/82 (BP Location: Right Arm, Patient Position: Sitting, Cuff Size: Normal)   Pulse 64   Resp 14   SpO2 96%   Opioid Risk Score:   Fall Risk Score:  `1  Depression screen PHQ 2/9  Depression screen [facility] 2/9 08/22/2017 06/20/2017 05/16/2017 02/28/2017 01/27/2017 01/11/2017 12/30/2016  Decreased Interest 0 1 2 0 3 1 3   Down, Depressed, Hopeless 0 1 1 0 1 2 2   PHQ - 2 Score 0 2 3 0 4 3 5   Altered sleeping - 2 - - 3 3 3   Tired, decreased energy - 2 - - 3 3 3   Change in appetite - 2 - - 3 3 3   Feeling bad or failure about yourself  - 0 - - 1 1 2   Trouble concentrating - 1 - - 1 1 1   Moving slowly or fidgety/restless - 1 - - 2 2 1   Suicidal thoughts - 0 - - 0 0 0  PHQ-9 Score - 10 - - 17 16 18   Difficult doing work/chores - - - - - - -  Some recent data might be hidden    Review of Systems  Constitutional: Positive for appetite change, diaphoresis and unexpected weight change.  HENT: Negative.   Eyes: Positive for visual disturbance.  Cardiovascular: Positive for leg swelling.  Gastrointestinal: Positive for constipation.  Genitourinary: Positive for difficulty urinating and dysuria.  Musculoskeletal: Positive for arthralgias and neck pain.       Spasms   Skin: Positive for rash.  Neurological: Positive for dizziness, tremors, weakness, numbness and headaches.       Tingling        Objective:   Physical Exam  General: No acute distress HEENT: EOMI, oral membranes moist Cards: reg rate  Chest: normal effort Abdomen: Soft, NT, ND Skin: dry, intact Extremities: no edema Skin:visible skin intact Neuro:cognitvely intact. Strength 5/5. Normal sensory Musculoskeletal:left shoulder with limited PROM and AROM. TTP with palpation anteriorly, laterally, and posteriorly. Antalgia with WB LLE. Psych:Pt's affect is appropriate. Remains pleasant as always.    Assessment & Plan:  1. Post-traumatic arthritis left shoulder s/p 2 shoulder surgeries. Now with adhesive capsulitis left shoulder and chronic pain 2. Cervicalgia, spondylosis.  3. Right ITB syndrome  4. Depression/insomnia/anxiety  5. History of right chest pain/tingling----?resolved 6. Bilateral knee pain, Left > right. ?likely meniscal/soft tissue injury 7. Multiple drug allergies 8. Hx of urinary incontinence, ?acute UTI   Plan:  1. Continue TENS to shoulder and neck. Discussed PNS today with patient again- information was provided to her. She seems very interested but I want her to be sure before we proceed. She would like to come off narcotics.  2.Discuss urinary symptoms. She has seen urology in the past for incontinence.     -will check UA,UCX and BMET today given description of urine 3. Gabapentin, increase to 300mg  TID 4.stop diclofenac for now given #2 5. Knee pain stable 6. Hydrocodone  5/325 one qday prn in place of [facility] IR. We will continue the opioid monitoring program, this consists of regular clinic visits, examinations, routine drug screening, pill counts as well as use of [place] Controlled Substance Reporting System. [place].   7. HEP for neckand shoulder as tolerated 8. Lexapro for depression/anxiety at 20mg  daily.Continue with coping strategies also.  9. I  will see her back in about 6month's time. 25 minutes of face to face patient care time were spent during this visit. All questions were encouraged and answered. Marland Kitchen Marland Kitchen

## 2017-11-21 ENCOUNTER — Telehealth: Payer: Self-pay | Admitting: Physical Medicine & Rehabilitation

## 2017-11-21 NOTE — Telephone Encounter (Signed)
9:00 am [facility] called to confirm ptn hyrocodone 5-325 per pharmacy insurance wants to confirm you are aware alprazalom prescribred by other md is also in use - please call them [phone] to allow them to refill

## 2017-11-21 NOTE — Telephone Encounter (Signed)
Pharmacy contacted and notified that we are aware of possible interactions with pain medication and benzodiazapines

## 2017-11-22 ENCOUNTER — Telehealth: Payer: Self-pay | Admitting: Physical Medicine & Rehabilitation

## 2017-11-22 DIAGNOSIS — B9620 Unspecified Escherichia coli [E. coli] as the cause of diseases classified elsewhere: Secondary | ICD-10-CM

## 2017-11-22 DIAGNOSIS — N390 Urinary tract infection, site not specified: Principal | ICD-10-CM

## 2017-11-22 LAB — BASIC METABOLIC PANEL
BUN / CREAT RATIO: 10 — AB (ref 12–28)
BUN: 10 mg/dL (ref 8–27)
CO2: 23 mmol/L (ref 20–29)
Calcium: 9.5 mg/dL (ref 8.7–10.3)
Chloride: 102 mmol/L (ref 96–106)
Creatinine, Ser: 1.02 mg/dL — ABNORMAL HIGH (ref 0.57–1.00)
GFR calc Af Amer: 69 mL/min/{1.73_m2} (ref >59)
GFR calc non Af Amer: 60 mL/min/{1.73_m2} (ref >59)
GLUCOSE: 85 mg/dL (ref 65–99)
Potassium: 4.2 mmol/L (ref 3.5–5.2)
SODIUM: 141 mmol/L (ref 134–144)

## 2017-11-22 LAB — URINALYSIS
Bilirubin, UA: NEGATIVE
Glucose, UA: NEGATIVE
Ketones, UA: NEGATIVE
Nitrite, UA: POSITIVE — AB
PH UA: 6 (ref 5.0–7.5)
PROTEIN UA: NEGATIVE
RBC UA: NEGATIVE
SPEC GRAV UA: 1.012 (ref 1.005–1.030)
Urobilinogen, Ur: 0.2 mg/dL (ref 0.2–1.0)

## 2017-11-22 LAB — URINE CULTURE

## 2017-11-22 MED ORDER — NITROFURANTOIN MONOHYD MACRO 100 MG PO CAPS: 100.0000 mg | ORAL_CAPSULE | Freq: Two times a day (BID) | ORAL | 0 refills | Status: DC

## 2017-11-22 NOTE — Telephone Encounter (Signed)
Contacted [name] about her lab work.  Blood work essentially wnl.  She does have a UTI.  Called in 1 week of Macrobid to her pharmacy.  Patient understands and appreciates the phone call.

## 2017-12-04 ENCOUNTER — Other Ambulatory Visit: Payer: Self-pay | Admitting: Physical Medicine & Rehabilitation

## 2017-12-19 ENCOUNTER — Ambulatory Visit: Payer: Medicare Other | Admitting: Family Medicine

## 2017-12-19 ENCOUNTER — Encounter: Payer: Self-pay | Admitting: Family Medicine

## 2017-12-19 VITALS — BP 121/70 | HR 67 | Temp 96.9°F | Ht 66.0 in | Wt 163.5 lb

## 2017-12-19 DIAGNOSIS — N3941 Urge incontinence: Secondary | ICD-10-CM

## 2017-12-19 DIAGNOSIS — E782 Mixed hyperlipidemia: Secondary | ICD-10-CM | POA: Diagnosis not present

## 2017-12-19 DIAGNOSIS — F411 Generalized anxiety disorder: Secondary | ICD-10-CM | POA: Diagnosis not present

## 2017-12-19 DIAGNOSIS — J439 Emphysema, unspecified: Secondary | ICD-10-CM

## 2017-12-19 DIAGNOSIS — R5383 Other fatigue: Secondary | ICD-10-CM | POA: Diagnosis not present

## 2017-12-19 LAB — URINALYSIS
BILIRUBIN UA: NEGATIVE
GLUCOSE, UA: NEGATIVE
Leukocytes, UA: NEGATIVE
Nitrite, UA: NEGATIVE
PH UA: 6 (ref 5.0–7.5)
RBC UA: NEGATIVE
UUROB: 0.2 mg/dL (ref 0.2–1.0)

## 2017-12-19 MED ORDER — METHOCARBAMOL 500 MG PO TABS: 500.0000 mg | ORAL_TABLET | Freq: Two times a day (BID) | ORAL | 5 refills | Status: DC

## 2017-12-19 MED ORDER — FEXOFENADINE HCL 180 MG PO TABS: 180.0000 mg | ORAL_TABLET | Freq: Every day | ORAL | 3 refills | Status: DC | PRN

## 2017-12-19 MED ORDER — ESCITALOPRAM OXALATE 20 MG PO TABS: 20.0000 mg | ORAL_TABLET | Freq: Every day | ORAL | 0 refills | Status: DC

## 2017-12-19 MED ORDER — CIPROFLOXACIN HCL 250 MG PO TABS: 250.0000 mg | ORAL_TABLET | Freq: Every day | ORAL | 5 refills | Status: DC

## 2017-12-19 MED ORDER — ALPRAZOLAM 1 MG PO TABS: ORAL_TABLET | ORAL | 5 refills | Status: DC

## 2017-12-19 NOTE — Progress Notes (Signed)
Subjective:  Patient ID: Cassandra [name], female    DOB: 10-Dec-1955  Age: 62 y.o. MRN: [id number]  CC: No chief complaint on file.   HPI Cassandra [name] presents for six-month follow-up on her anxiety.  She also has issues with depression.  PHQ score noted below.  Her main anxiety symptom is nervousness and feeling that this something bad could happen/dysphoria.  She gets some chest tightness when she feels this way as well.  Symptoms recur 2 or 3 times a day sometimes.  Patient also says that she has had 3 surgeries on her bladder and she is been told she is going to have to live with her urinary incontinence.  She has urge incontinence with frequency.  She would like to have a medication to help.  However her main concerns this seems to lead to frequent urinary tract infections.  She was on a daily prophylactic antibiotic many years ago and was taken off by a previous primary care provider.  Patient also has issues with fatigue.  She has been diagnosed with fibromyalgia.  She takes hydrocodone chronically through pain management and sees Dr. Geannie Risen for that.  Depression screen [facility] 2/9 12/19/2017 08/22/2017 06/20/2017  Decreased Interest 2 0 1  Down, Depressed, Hopeless 0 0 1  PHQ - 2 Score 2 0 2  Altered sleeping 2 - 2  Tired, decreased energy 3 - 2  Change in appetite 2 - 2  Feeling bad or failure about yourself  0 - 0  Trouble concentrating 1 - 1  Moving slowly or fidgety/restless 0 - 1  Suicidal thoughts 0 - 0  PHQ-9 Score 10 - 10  Difficult doing work/chores - - -  Some recent data might be hidden    History Cassandra [name] has a past medical history of Anxiety, Bowel habit changes, Chronic left shoulder pain, Chronic pain, COPD (chronic obstructive pulmonary disease) ([facility]), Depression, GERD (gastroesophageal reflux disease), Hyperlipidemia, Maternal complication related to childbirth, Meniere's disease, Nicotine addiction, Rapid or irregular heartbeat, Skin cancer, Tubular adenoma of  colon (01/31/2000), and Weakness of pelvic floor.   She has a past surgical history that includes Bladder repair; Shoulder arthroscopy; Abdominal hysterectomy; Knee arthroscopy (Left); Cesarean section; ruptured uterus; and Anal Rectal manometry (N/A, 12/09/2015).   Her family history includes COPD in her brother; Colon cancer in her unknown relative; Colon polyps in her sister; Coronary artery disease in her brother, father, and mother; Diabetes in her mother; Heart disease in her sister; Kidney disease in her mother; Liver cancer in her maternal grandmother; Liver disease in her maternal grandmother; Lung cancer in her paternal grandmother; Lung disease in her father; Polymyalgia rheumatica in her sister; Prostate cancer in her father.She reports that she quit smoking about 2 years ago. Her smoking use included cigarettes. She has a 42.00 pack-year smoking history. She has never used smokeless tobacco. She reports that she does not drink alcohol or use drugs.    ROS Review of Systems  Constitutional: Negative.   HENT: Negative for congestion.   Eyes: Negative for visual disturbance.  Respiratory: Negative for shortness of breath.   Cardiovascular: Negative for chest pain.  Gastrointestinal: Negative for abdominal pain, constipation, diarrhea, nausea and vomiting.  Genitourinary: Positive for enuresis and frequency.       Urinary incontinence.  Musculoskeletal: Positive for arthralgias and myalgias.  Neurological: Negative for headaches.  Psychiatric/Behavioral: Negative for sleep disturbance.    Objective:  BP 121/70   Pulse 67   Temp (!) 96.9  F (36.1 C) (Oral)   Ht 5' 6"  (1.676 m)   Wt 163 lb 8 oz (74.2 kg)   BMI 26.39 kg/m   BP Readings from Last 3 Encounters:  12/19/17 121/70  11/20/17 (!) 152/82  10/23/17 (!) 153/84    Wt Readings from Last 3 Encounters:  12/19/17 163 lb 8 oz (74.2 kg)  06/20/17 160 lb (72.6 kg)  02/28/17 157 lb (71.2 kg)     Physical Exam    Constitutional: She is oriented to person, place, and time. She appears well-developed and well-nourished. No distress.  HENT:  Head: Normocephalic and atraumatic.  Right Ear: External ear normal.  Left Ear: External ear normal.  Neck: Normal range of motion. Neck supple.  Cardiovascular: Normal rate, regular rhythm and normal heart sounds.  No murmur heard. Pulmonary/Chest: Effort normal and breath sounds normal. No respiratory distress. She has no wheezes. She has no rales.  Abdominal: Soft. There is no tenderness.  Musculoskeletal: Normal range of motion. She exhibits no edema or tenderness.  Neurological: She is alert and oriented to person, place, and time. She exhibits normal muscle tone. Coordination normal.  Skin: Skin is warm and dry.  Psychiatric: She has a normal mood and affect. Her behavior is normal.      Assessment & Plan:   Diagnoses and all orders for this visit:  Urgency incontinence -     Urinalysis -     Urine Culture  Mixed hyperlipidemia -     CBC with Differential/Platelet -     CMP14+EGFR  Fatigue, unspecified type -     TSH  GAD (generalized anxiety disorder)  Pulmonary emphysema, unspecified emphysema type ([place])  Other orders -     ciprofloxacin (CIPRO) 250 MG tablet; Take 1 tablet (250 mg total) by mouth daily. -     methocarbamol (ROBAXIN) 500 MG tablet; Take 1 tablet (500 mg total) by mouth 2 (two) times daily. -     escitalopram (LEXAPRO) 20 MG tablet; Take 1 tablet (20 mg total) by mouth at bedtime. -     fexofenadine (ALLEGRA) 180 MG tablet; Take 1 tablet (180 mg total) by mouth daily as needed for allergies. -     ALPRAZolam (XANAX) 1 MG tablet; Take one-half  tablet every morning and 1 tablet every evening for Meniere's Disease       I have changed Cassandra Mcgees methocarbamol and ALPRAZolam. I am also having her start on ciprofloxacin. Additionally, I am having her maintain her conjugated estrogens, meclizine, hydroxypropyl  methylcellulose / hypromellose, chlorhexidine, mupirocin ointment, cholecalciferol, diclofenac, ondansetron, gabapentin, HYDROcodone-acetaminophen, nitrofurantoin (macrocrystal-monohydrate), escitalopram, and fexofenadine.  Allergies as of 12/19/2017      Reactions   Chantix [varenicline] Rash   Codeine Itching, Nausea And Vomiting   Cymbalta [duloxetine Hcl] Nausea Only   Livalo [pitavastatin] Other (See Comments)   Seizures    Metoprolol Other (See Comments)   Had episode of possible hypotension or seizure described and muscles clamping down and searing pain toe to head and near fall x2. Loss of bowel control. No further symptoms after stopped.   Morphine And Related Hives   Itching hive rash   Oxycodone-aspirin Nausea And Vomiting   Oxycontin [oxycodone Hcl] Other (See Comments)   Altered mental status   Propoxyphene N-acetaminophen Nausea And Vomiting   Trazodone And Nefazodone Other (See Comments)   Medication is too strong   Zithromax [azithromycin] Other (See Comments)   Unknown    Other Rash   Lipturzet caused  enlarged veins in legs   Oxycodone-acetaminophen Hives, Nausea And Vomiting, Rash   Septra [sulfamethoxazole-trimethoprim] Rash      Medication List        Accurate as of 12/19/17 10:40 PM. Always use your most recent med list.          ALPRAZolam 1 MG tablet Commonly known as:  XANAX Take one-half  tablet every morning and 1 tablet every evening for Meniere's Disease   chlorhexidine 4 % external liquid Commonly known as:  HIBICLENS Shower daily for 5 days. Apply all over, including scalp and leave on  5 min before rinsing.   cholecalciferol 1000 units tablet Commonly known as:  VITAMIN D Take 1,000 Units by mouth daily.   ciprofloxacin 250 MG tablet Commonly known as:  CIPRO Take 1 tablet (250 mg total) by mouth daily.   conjugated estrogens vaginal cream Commonly known as:  PREMARIN Place vaginally daily.   diclofenac 75 MG EC tablet Commonly known  as:  VOLTAREN Take 1 tablet (75 mg total) by mouth 2 (two) times daily with a meal.   escitalopram 20 MG tablet Commonly known as:  LEXAPRO Take 1 tablet (20 mg total) by mouth at bedtime.   fexofenadine 180 MG tablet Commonly known as:  ALLEGRA Take 1 tablet (180 mg total) by mouth daily as needed for allergies.   gabapentin 300 MG capsule Commonly known as:  NEURONTIN Take 1 capsule (300 mg total) by mouth 3 (three) times daily.   HYDROcodone-acetaminophen 5-325 MG tablet Commonly known as:  NORCO/VICODIN Take 1 tablet by mouth daily as needed for moderate pain.   hydroxypropyl methylcellulose / hypromellose 2.5 % ophthalmic solution Commonly known as:  ISOPTO TEARS / GONIOVISC Place 1 drop into both eyes as needed for dry eyes.   meclizine 25 MG tablet Commonly known as:  ANTIVERT TAKE 1 TABLET (25 MG TOTAL) BY MOUTH 2 (TWO) TIMES DAILY AS NEEDED.   methocarbamol 500 MG tablet Commonly known as:  ROBAXIN Take 1 tablet (500 mg total) by mouth 2 (two) times daily.   mupirocin ointment 2 % Commonly known as:  BACTROBAN Place 1 application into the nose 2 (two) times daily.   nitrofurantoin (macrocrystal-monohydrate) 100 MG capsule Commonly known as:  MACROBID Take 1 capsule (100 mg total) by mouth 2 (two) times daily.   ondansetron 8 MG disintegrating tablet Commonly known as:  ZOFRAN-ODT Take 1 tablet (8 mg total) by mouth every 6 (six) hours as needed for nausea or vomiting.        Follow-up: Return in about 6 months (around 06/20/2018).  [name], M.D.

## 2017-12-20 ENCOUNTER — Ambulatory Visit (INDEPENDENT_AMBULATORY_CARE_PROVIDER_SITE_OTHER): Payer: Medicare Other

## 2017-12-20 VITALS — BP 126/75 | HR 78 | Temp 98.5°F | Ht 66.0 in | Wt 163.0 lb

## 2017-12-20 DIAGNOSIS — Z0000 Encounter for general adult medical examination without abnormal findings: Secondary | ICD-10-CM

## 2017-12-20 LAB — CMP14+EGFR
A/G RATIO: 1.9 (ref 1.2–2.2)
ALBUMIN: 4.5 g/dL (ref 3.6–4.8)
ALT: 14 IU/L (ref 0–32)
AST: 19 IU/L (ref 0–40)
Alkaline Phosphatase: 107 IU/L (ref 39–117)
BILIRUBIN TOTAL: 0.3 mg/dL (ref 0.0–1.2)
BUN / CREAT RATIO: 18 (ref 12–28)
BUN: 17 mg/dL (ref 8–27)
CHLORIDE: 101 mmol/L (ref 96–106)
CO2: 22 mmol/L (ref 20–29)
Calcium: 9.5 mg/dL (ref 8.7–10.3)
Creatinine, Ser: 0.96 mg/dL (ref 0.57–1.00)
GFR calc Af Amer: 73 mL/min/{1.73_m2} (ref >59)
GFR calc non Af Amer: 64 mL/min/{1.73_m2} (ref >59)
GLOBULIN, TOTAL: 2.4 g/dL (ref 1.5–4.5)
Glucose: 97 mg/dL (ref 65–99)
POTASSIUM: 4.4 mmol/L (ref 3.5–5.2)
SODIUM: 140 mmol/L (ref 134–144)
Total Protein: 6.9 g/dL (ref 6.0–8.5)

## 2017-12-20 LAB — CBC WITH DIFFERENTIAL/PLATELET
Basophils Absolute: 0 x10E3/uL (ref 0.0–0.2)
Basos: 1 %
EOS (ABSOLUTE): 0.1 x10E3/uL (ref 0.0–0.4)
Eos: 1 %
Hematocrit: 43.4 % (ref 34.0–46.6)
Hemoglobin: 14.1 g/dL (ref 11.1–15.9)
Immature Grans (Abs): 0 x10E3/uL (ref 0.0–0.1)
Immature Granulocytes: 0 %
Lymphocytes Absolute: 1.8 x10E3/uL (ref 0.7–3.1)
Lymphs: 33 %
MCH: 29.7 pg (ref 26.6–33.0)
MCHC: 32.5 g/dL (ref 31.5–35.7)
MCV: 91 fL (ref 79–97)
Monocytes Absolute: 0.5 x10E3/uL (ref 0.1–0.9)
Monocytes: 10 %
Neutrophils Absolute: 3 x10E3/uL (ref 1.4–7.0)
Neutrophils: 55 %
Platelets: 345 x10E3/uL (ref 150–379)
RBC: 4.75 x10E6/uL (ref 3.77–5.28)
RDW: 15.2 % (ref 12.3–15.4)
WBC: 5.4 x10E3/uL (ref 3.4–10.8)

## 2017-12-20 LAB — TSH: TSH: 0.786 u[IU]/mL (ref 0.450–4.500)

## 2017-12-20 NOTE — Progress Notes (Signed)
Subjective:   Cassandra [name] is a 62 y.o. female who presents for Medicare Annual (Subsequent) preventive examination. She is a very pleasant lady who lives in nearby [place]. She has chronic pain from her back, neck, and knees but is able to manage it appropriately with current medication regimen. She lives at home with her husband. She has one child. She enjoys working her in flowers when she feels able. She has an outside cat. She is looking forward to a procedure that will place an implant in her arm that may assist her in getting off of several medications that she is taking. She is awaiting medicare approval. She does not have a regular exercise routine but tries to walk as much as she is able. She doesn't have any complaints today.   Review of Systems:         Objective:     Vitals: BP 126/75   Pulse 78   Temp 98.5 F (36.9 C) (Oral)   Ht 5\' 6"  (1.676 m)   Wt 163 lb (73.9 kg)   BMI 26.31 kg/m   Body mass index is 26.31 kg/m.  Advanced Directives 12/20/2017 06/12/2017 05/16/2017 04/14/2017 10/18/2016 08/17/2016 06/21/2016  Does Patient Have a Medical Advance Directive? No No No No Yes Yes No  Would patient like information on creating a medical advance directive? No - Patient declined - (No Data) - - - No - patient declined information   Patient does not currently have an advanced directive in place but has the information about it at home and states that she will go over it.  Tobacco Social History   Tobacco Use  Smoking Status Former Smoker  . Packs/day: 1.00  . Years: 42.00  . Pack years: 42.00  . Types: Cigarettes  . Last attempt to quit: 06/01/2015  . Years since quitting: 2.5  Smokeless Tobacco Never Used  Tobacco Comment   would like to try chantix, but has not started on it due to warnings related to depression     Counseling given: Not Answered Comment: would like to try chantix, but has not started on it due to warnings related to depression   Clinical  Intake:  Pre-visit preparation completed: Yes  Pain : 0-10 Pain Score: 7  Pain Type: Chronic pain Pain Location: Knee Pain Orientation: Left, Right Pain Descriptors / Indicators: Radiating, Burning, Sharp Pain Onset: More than a month ago Pain Frequency: Several days a week Pain Relieving Factors: Exercise  Pain Relieving Factors: Exercise  Diabetes: No  How often do you need to have someone help you when you read instructions, pamphlets, or other written materials from your doctor or pharmacy?: 1 - Never What is the last grade level you completed in school?: GED  Interpreter Needed?: No     Past Medical History:  Diagnosis Date  . Anxiety   . Bowel habit changes    excessive gas /frequent BM's with change in shape with pus  . Chronic left shoulder pain    frozen   . Chronic pain    left upper shoulder ,neck and back  . COPD (chronic obstructive pulmonary disease) ([place])    Dr. Redge Gainer  . Depression   . GERD (gastroesophageal reflux disease)   . Hyperlipidemia   . Maternal complication related to childbirth    Sepsis, staph  . Meniere's disease   . Nicotine addiction    2 packs per day   . Rapid or irregular heartbeat   . Skin cancer  Rt. leg, squamous cell  . Tubular adenoma of colon 01/31/2000   Dr. Richmond Campbell  . Weakness of pelvic floor    prolapse -  MRI from 2017   Past Surgical History:  Procedure Laterality Date  . ABDOMINAL HYSTERECTOMY     age 43  . ANAL RECTAL MANOMETRY N/A 12/09/2015   Procedure: ANO RECTAL MANOMETRY;  Surgeon: Mauri Pole, MD;  Location: [facility];  Service: Endoscopy;  Laterality: N/A;  . BLADDER REPAIR     sling/with perforation  . CESAREAN SECTION     1 time  . KNEE ARTHROSCOPY Left   . ruptured uterus    . SHOULDER ARTHROSCOPY     multiple   Family History  Problem Relation Age of Onset  . Coronary artery disease Mother   . Diabetes Mother        borderline  . Kidney disease Mother        stage  4  . Coronary artery disease Father   . Prostate cancer Father   . Lung disease Father        black lung  . Liver cancer Maternal Grandmother   . Liver disease Maternal Grandmother   . Polymyalgia rheumatica Sister   . Heart disease Sister   . Coronary artery disease Brother        heart attack  . COPD Brother   . Colon polyps Sister   . Lung cancer Paternal Grandmother   . Colon cancer Unknown        Both sides   Social History   Socioeconomic History  . Marital status: Married    Spouse name: Not on file  . Number of children: 1  . Years of education: Not on file  . Highest education level: GED or equivalent  Occupational History  . Occupation: Agricultural engineer  Social Needs  . Financial resource strain: Not hard at all  . Food insecurity:    Worry: Never true    Inability: Never true  . Transportation needs:    Medical: No    Non-medical: No  Tobacco Use  . Smoking status: Former Smoker    Packs/day: 1.00    Years: 42.00    Pack years: 42.00    Types: Cigarettes    Last attempt to quit: 06/01/2015    Years since quitting: 2.5  . Smokeless tobacco: Never Used  . Tobacco comment: would like to try chantix, but has not started on it due to warnings related to depression  Substance and Sexual Activity  . Alcohol use: No    Alcohol/week: 0.0 oz  . Drug use: No  . Sexual activity: Yes  Lifestyle  . Physical activity:    Days per week: 3 days    Minutes per session: 30 min  . Stress: Not at all  Relationships  . Social connections:    Talks on phone: More than three times a week    Gets together: More than three times a week    Attends religious service: 1 to 4 times per year    Active member of club or organization: No    Attends meetings of clubs or organizations: Never    Relationship status: Married  Other Topics Concern  . Not on file  Social History Narrative  . Not on file    Outpatient Encounter Medications as of 12/20/2017  Medication Sig  .  ALPRAZolam (XANAX) 1 MG tablet Take one-half  tablet every morning and 1 tablet every evening for Meniere's  Disease  . chlorhexidine (HIBICLENS) 4 % external liquid Shower daily for 5 days. Apply all over, including scalp and leave on  5 min before rinsing.  . cholecalciferol (VITAMIN D) 1000 units tablet Take 1,000 Units by mouth daily.  . ciprofloxacin (CIPRO) 250 MG tablet Take 1 tablet (250 mg total) by mouth daily.  Marland Kitchen conjugated estrogens (PREMARIN) vaginal cream Place vaginally daily.  . diclofenac (VOLTAREN) 75 MG EC tablet Take 1 tablet (75 mg total) by mouth 2 (two) times daily with a meal.  . escitalopram (LEXAPRO) 20 MG tablet Take 1 tablet (20 mg total) by mouth at bedtime.  . fexofenadine (ALLEGRA) 180 MG tablet Take 1 tablet (180 mg total) by mouth daily as needed for allergies.  Marland Kitchen gabapentin (NEURONTIN) 300 MG capsule Take 1 capsule (300 mg total) by mouth 3 (three) times daily.  Marland Kitchen HYDROcodone-acetaminophen (NORCO/VICODIN) 5-325 MG tablet Take 1 tablet by mouth daily as needed for moderate pain.  . hydroxypropyl methylcellulose / hypromellose (ISOPTO TEARS / GONIOVISC) 2.5 % ophthalmic solution Place 1 drop into both eyes as needed for dry eyes.  . meclizine (ANTIVERT) 25 MG tablet TAKE 1 TABLET (25 MG TOTAL) BY MOUTH 2 (TWO) TIMES DAILY AS NEEDED.  . methocarbamol (ROBAXIN) 500 MG tablet Take 1 tablet (500 mg total) by mouth 2 (two) times daily.  . mupirocin ointment (BACTROBAN) 2 % Place 1 application into the nose 2 (two) times daily.  . nitrofurantoin, macrocrystal-monohydrate, (MACROBID) 100 MG capsule Take 1 capsule (100 mg total) by mouth 2 (two) times daily.  . ondansetron (ZOFRAN-ODT) 8 MG disintegrating tablet Take 1 tablet (8 mg total) by mouth every 6 (six) hours as needed for nausea or vomiting.   No facility-administered encounter medications on file as of 12/20/2017.     Activities of Daily Living In your present state of health, do you have any difficulty performing  the following activities: 12/20/2017  Hearing? N  Vision? Y  Comment Wears contacts and has glasses  Difficulty concentrating or making decisions? Y  Comment At times  Walking or climbing stairs? Y  Comment Patient gives out of breath when walking up stairs  Dressing or bathing? N  Doing errands, shopping? N  Preparing Food and eating ? N  Using the Toilet? N  In the past six months, have you accidently leaked urine? N  Do you have problems with loss of bowel control? N  Managing your Medications? N  Managing your Finances? N  Housekeeping or managing your Housekeeping? N  Some recent data might be hidden    Patient Care Team: [name], MD as PCP - General (Family Medicine) Meredith Staggers, MD as Consulting Physician (Physical Medicine and Rehabilitation)    Assessment:   This is a routine wellness examination for Cassandra [name].  Exercise Activities and Dietary recommendations Current Exercise Habits: The patient does not participate in regular exercise at present  Goals    . DIET - EAT MORE FRUITS AND VEGETABLES    . Exercise 150 min/wk Moderate Activity       Fall Risk Fall Risk  12/20/2017 11/20/2017 08/22/2017 07/12/2017 06/12/2017  Falls in the past year? No Yes No No Yes  Comment - - - - last fall  May 2018  Number falls in past yr: - 2 or more - - 2 or more  Injury with Fall? - Yes - - Yes  Comment - - - - dislocated knee cap  Risk Factor Category  - High Fall Risk - -  High Fall Risk  Comment - - - - -  Risk for fall due to : - Impaired vision;Medication side effect;History of fall(s);Impaired balance/gait - - -  Risk for fall due to: Comment - - - - -  Follow up - Falls prevention discussed;Education provided - - Falls prevention discussed;Education provided  Comment - - - - -   Is the patient's home free of loose throw rugs in walkways, pet beds, electrical cords, etc?   yes      Grab bars in the bathroom? no      Handrails on the stairs?   yes      Adequate  lighting?   yes    Depression Screen PHQ 2/9 Scores 12/20/2017 12/19/2017 08/22/2017 06/20/2017  PHQ - 2 Score 2 2 0 2  PHQ- 9 Score 10 10 - 10  Exception Documentation - - - -  Not completed - - - -     Cognitive Function MMSE - Mini Mental State Exam 12/20/2017 04/27/2016 04/27/2016 01/13/2015  Orientation to time 5 5 5 5   Orientation to Place 5 5 5 5   Registration 3 3 3 3   Attention/ Calculation 5 5 5 3   Recall 3 2 2 3   Language- name 2 objects 2 2 2 2   Language- repeat 1 1 - 1  Language- follow 3 step command 3 3 - 3  Language- read & follow direction 1 1 - 1  Write a sentence 1 1 - 1  Copy design 1 1 - 1  Total score 30 29 - 28        Immunization History  Administered Date(s) Administered  . Influenza Split 06/21/2010  . Influenza, Seasonal, Injecte, Preservative Fre 06/25/2014  . Influenza,inj,Quad PF,6+ Mos 07/09/2013, 06/09/2015, 06/27/2016, 06/20/2017  . Td 05/13/2008    Qualifies for Shingles Vaccine? Patient will discuss with her PCP at follow up visit  Screening Tests Health Maintenance  Topic Date Due  . INFLUENZA VACCINE  04/12/2018  . TETANUS/TDAP  05/13/2018  . PAP SMEAR  03/25/2019  . MAMMOGRAM  10/11/2019  . COLONOSCOPY  08/04/2025  . Hepatitis C Screening  Completed  . HIV Screening  Completed    Cancer Screenings: Lung: Low Dose CT Chest recommended if Age 70-80 years, 30 pack-year currently smoking OR have quit w/in 15years. Patient does not qualify. Patient completed this screening on 05/25/2016 Breast:  Up to date on Mammogram? Yes   Up to date of Bone Density/Dexa? Yes Colorectal:   Additional Screenings:  Hepatitis C Screening:  Completed on 01/18/2016     Plan:     I have personally reviewed and noted the following in the patient's chart:   . Medical and social history . Use of alcohol, tobacco or illicit drugs  . Current medications and supplements . Functional ability and status . Nutritional status . Physical  activity . Advanced directives . List of other physicians . Hospitalizations, surgeries, and ER visits in previous 12 months . Vitals . Screenings to include cognitive, depression, and falls . Referrals and appointments  In addition, I have reviewed and discussed with patient certain preventive protocols, quality metrics, and best practice recommendations. A written personalized care plan for preventive services as well as general preventive health recommendations were provided to patient.   Patients health maintenance is up to date. Will discuss shingles vaccine when patient returns for a follow up appt  Rolena Infante, LPN  04/29/2992   I have reviewed and agree with the above AWV  documentation.  [name], M.D.

## 2017-12-20 NOTE — Patient Instructions (Signed)
  Cassandra [name] , Thank you for taking time to come for your Medicare Wellness Visit. I appreciate your ongoing commitment to your health goals. Please review the following plan we discussed and let me know if I can assist you in the future.   These are the goals we discussed: Goals    . DIET - EAT MORE FRUITS AND VEGETABLES    . Exercise 150 min/wk Moderate Activity       This is a list of the screening recommended for you and due dates:  Health Maintenance  Topic Date Due  . Flu Shot  04/12/2018  . Tetanus Vaccine  05/13/2018  . Pap Smear  03/25/2019  . Mammogram  10/11/2019  . Colon Cancer Screening  08/04/2025  .  Hepatitis C: One time screening is recommended by Center for Disease Control  (CDC) for  adults born from [birth year] through 1965.   Completed  . HIV Screening  Completed

## 2017-12-21 LAB — URINE CULTURE

## 2017-12-22 ENCOUNTER — Other Ambulatory Visit: Payer: Self-pay | Admitting: Family Medicine

## 2017-12-22 MED ORDER — AMOXICILLIN 500 MG PO CAPS: 500.0000 mg | ORAL_CAPSULE | Freq: Three times a day (TID) | ORAL | 0 refills | Status: DC

## 2017-12-25 ENCOUNTER — Encounter: Payer: Medicare Other | Attending: Physical Medicine & Rehabilitation | Admitting: Physical Medicine & Rehabilitation

## 2017-12-25 ENCOUNTER — Encounter: Payer: Self-pay | Admitting: Physical Medicine & Rehabilitation

## 2017-12-25 ENCOUNTER — Other Ambulatory Visit: Payer: Self-pay

## 2017-12-25 VITALS — BP 113/75 | HR 76 | Ht 66.0 in | Wt 163.4 lb

## 2017-12-25 DIAGNOSIS — G8929 Other chronic pain: Secondary | ICD-10-CM | POA: Diagnosis not present

## 2017-12-25 DIAGNOSIS — F329 Major depressive disorder, single episode, unspecified: Secondary | ICD-10-CM | POA: Insufficient documentation

## 2017-12-25 DIAGNOSIS — M47812 Spondylosis without myelopathy or radiculopathy, cervical region: Secondary | ICD-10-CM | POA: Insufficient documentation

## 2017-12-25 DIAGNOSIS — Z8249 Family history of ischemic heart disease and other diseases of the circulatory system: Secondary | ICD-10-CM | POA: Diagnosis not present

## 2017-12-25 DIAGNOSIS — Z833 Family history of diabetes mellitus: Secondary | ICD-10-CM | POA: Insufficient documentation

## 2017-12-25 DIAGNOSIS — M25562 Pain in left knee: Secondary | ICD-10-CM | POA: Insufficient documentation

## 2017-12-25 DIAGNOSIS — Z5181 Encounter for therapeutic drug level monitoring: Secondary | ICD-10-CM | POA: Diagnosis not present

## 2017-12-25 DIAGNOSIS — E785 Hyperlipidemia, unspecified: Secondary | ICD-10-CM | POA: Diagnosis not present

## 2017-12-25 DIAGNOSIS — K219 Gastro-esophageal reflux disease without esophagitis: Secondary | ICD-10-CM | POA: Insufficient documentation

## 2017-12-25 DIAGNOSIS — Z8042 Family history of malignant neoplasm of prostate: Secondary | ICD-10-CM | POA: Insufficient documentation

## 2017-12-25 DIAGNOSIS — M25561 Pain in right knee: Secondary | ICD-10-CM | POA: Diagnosis not present

## 2017-12-25 DIAGNOSIS — G4700 Insomnia, unspecified: Secondary | ICD-10-CM | POA: Insufficient documentation

## 2017-12-25 DIAGNOSIS — F419 Anxiety disorder, unspecified: Secondary | ICD-10-CM | POA: Insufficient documentation

## 2017-12-25 DIAGNOSIS — Z87891 Personal history of nicotine dependence: Secondary | ICD-10-CM | POA: Diagnosis not present

## 2017-12-25 DIAGNOSIS — Z85828 Personal history of other malignant neoplasm of skin: Secondary | ICD-10-CM | POA: Insufficient documentation

## 2017-12-25 DIAGNOSIS — Z825 Family history of asthma and other chronic lower respiratory diseases: Secondary | ICD-10-CM | POA: Insufficient documentation

## 2017-12-25 DIAGNOSIS — Z90710 Acquired absence of both cervix and uterus: Secondary | ICD-10-CM | POA: Diagnosis not present

## 2017-12-25 DIAGNOSIS — Z801 Family history of malignant neoplasm of trachea, bronchus and lung: Secondary | ICD-10-CM | POA: Diagnosis not present

## 2017-12-25 DIAGNOSIS — Z8371 Family history of colonic polyps: Secondary | ICD-10-CM | POA: Diagnosis not present

## 2017-12-25 DIAGNOSIS — G894 Chronic pain syndrome: Secondary | ICD-10-CM

## 2017-12-25 DIAGNOSIS — Z841 Family history of disorders of kidney and ureter: Secondary | ICD-10-CM | POA: Diagnosis not present

## 2017-12-25 DIAGNOSIS — J449 Chronic obstructive pulmonary disease, unspecified: Secondary | ICD-10-CM | POA: Diagnosis not present

## 2017-12-25 DIAGNOSIS — M7631 Iliotibial band syndrome, right leg: Secondary | ICD-10-CM | POA: Diagnosis not present

## 2017-12-25 DIAGNOSIS — M7502 Adhesive capsulitis of left shoulder: Secondary | ICD-10-CM | POA: Insufficient documentation

## 2017-12-25 DIAGNOSIS — Z79891 Long term (current) use of opiate analgesic: Secondary | ICD-10-CM

## 2017-12-25 DIAGNOSIS — M19012 Primary osteoarthritis, left shoulder: Secondary | ICD-10-CM | POA: Insufficient documentation

## 2017-12-25 MED ORDER — HYDROCODONE-ACETAMINOPHEN 7.5-325 MG PO TABS: 1.0000 | ORAL_TABLET | Freq: Four times a day (QID) | ORAL | 0 refills | Status: DC | PRN

## 2017-12-25 NOTE — Progress Notes (Signed)
Subjective:    Patient ID: Cassandra [name], female    DOB: [birth year], 62 y.o.   MRN: [id number]  HPI   Cassandra [name] is here in follow-up of her chronic pain.  She states that the hydrocodone is not covering her pain as well as the Percocet.  She is on the 5/325 mg dose.  She is using other strategies including TENS ice heat etc.  She also takes Robaxin and gabapentin which was increased to 300 mg 3 times daily last month.  She is struggling with an upper respiratory tract infection/allergies over the last few days.  I treated her E. coli UTI with Macrobid after last visit.  However she had another infection recur.  She was placed on amoxicillin 500 mg 3 times daily.  She is also on Cipro 250 mg daily for 1 month even though infection is resistant to this.  Patient has seen Dr. Alona Bene in the past for her bladder issues and has a sling per him.  NEEDS UDS TODAY Pain Inventory Average Pain 7 Pain Right Now 7 My pain is constant, sharp, burning, dull, stabbing and tingling  In the last 24 hours, has pain interfered with the following? General activity 6 Relation with others 7 Enjoyment of life 6 What TIME of day is your pain at its worst? morning evening night Sleep (in general) Poor  Pain is worse with: walking and bending Pain improves with: heat/ice and medication Relief from Meds: 6  Mobility walk without assistance ability to climb steps?  yes do you drive?  yes  Function not employed: date last employed 2002 disabled: date disabled 2005 I need assistance with the following:  dressing and household duties  Neuro/Psych bladder control problems bowel control problems weakness numbness tremor tingling spasms dizziness  Prior Studies Any changes since last visit?  no  Physicians involved in your care Any changes since last visit?  no   Family History  Problem Relation Age of Onset  . Coronary artery disease Mother   . Diabetes Mother        borderline  . Kidney  disease Mother        stage 4  . Coronary artery disease Father   . Prostate cancer Father   . Lung disease Father        black lung  . Liver cancer Maternal Grandmother   . Liver disease Maternal Grandmother   . Polymyalgia rheumatica Sister   . Heart disease Sister   . Coronary artery disease Brother        heart attack  . COPD Brother   . Colon polyps Sister   . Lung cancer Paternal Grandmother   . Colon cancer Unknown        Both sides   Social History   Socioeconomic History  . Marital status: Married    Spouse name: Not on file  . Number of children: 1  . Years of education: Not on file  . Highest education level: GED or equivalent  Occupational History  . Occupation: Agricultural engineer  Social Needs  . Financial resource strain: Not hard at all  . Food insecurity:    Worry: Never true    Inability: Never true  . Transportation needs:    Medical: No    Non-medical: No  Tobacco Use  . Smoking status: Former Smoker    Packs/day: 1.00    Years: 42.00    Pack years: 42.00    Types: Cigarettes    Last attempt  to quit: 06/01/2015    Years since quitting: 2.5  . Smokeless tobacco: Never Used  . Tobacco comment: would like to try chantix, but has not started on it due to warnings related to depression  Substance and Sexual Activity  . Alcohol use: No    Alcohol/week: 0.0 oz  . Drug use: No  . Sexual activity: Yes  Lifestyle  . Physical activity:    Days per week: 3 days    Minutes per session: 30 min  . Stress: Not at all  Relationships  . Social connections:    Talks on phone: More than three times a week    Gets together: More than three times a week    Attends religious service: 1 to 4 times per year    Active member of club or organization: No    Attends meetings of clubs or organizations: Never    Relationship status: Married  Other Topics Concern  . Not on file  Social History Narrative  . Not on file   Past Surgical History:  Procedure Laterality Date    . ABDOMINAL HYSTERECTOMY     age 34  . ANAL RECTAL MANOMETRY N/A 12/09/2015   Procedure: ANO RECTAL MANOMETRY;  Surgeon: Mauri Pole, MD;  Location: [facility];  Service: Endoscopy;  Laterality: N/A;  . BLADDER REPAIR     sling/with perforation  . CESAREAN SECTION     1 time  . KNEE ARTHROSCOPY Left   . ruptured uterus    . SHOULDER ARTHROSCOPY     multiple   Past Medical History:  Diagnosis Date  . Anxiety   . Bowel habit changes    excessive gas /frequent BM's with change in shape with pus  . Chronic left shoulder pain    frozen   . Chronic pain    left upper shoulder ,neck and back  . COPD (chronic obstructive pulmonary disease) ([place])    Dr. Redge Gainer  . Depression   . GERD (gastroesophageal reflux disease)   . Hyperlipidemia   . Maternal complication related to childbirth    Sepsis, staph  . Meniere's disease   . Nicotine addiction    2 packs per day   . Rapid or irregular heartbeat   . Skin cancer    Rt. leg, squamous cell  . Tubular adenoma of colon 01/31/2000   Dr. Richmond Campbell  . Weakness of pelvic floor    prolapse -  MRI from 2017   BP 113/75   Pulse 76   Ht 5\' 6"  (1.676 m) Comment: pt reported  Wt 163 lb 6.4 oz (74.1 kg)   SpO2 98%   BMI 26.37 kg/m   Opioid Risk Score:   Fall Risk Score:  `1  Depression screen PHQ 2/9  Depression screen [facility] 2/9 12/25/2017 12/20/2017 12/19/2017 08/22/2017 06/20/2017 05/16/2017 02/28/2017  Decreased Interest 0 2 2 0 1 2 0  Down, Depressed, Hopeless 0 0 0 0 1 1 0  PHQ - 2 Score 0 2 2 0 2 3 0  Altered sleeping 0 2 2 - 2 - -  Tired, decreased energy 0 3 3 - 2 - -  Change in appetite 0 2 2 - 2 - -  Feeling bad or failure about yourself  0 0 0 - 0 - -  Trouble concentrating 0 1 1 - 1 - -  Moving slowly or fidgety/restless 0 0 0 - 1 - -  Suicidal thoughts 0 0 0 - 0 - -  PHQ-9 Score 0 10 10 - 10 - -  Some recent data might be hidden   Review of Systems  Constitutional: Positive for unexpected weight change.   HENT: Negative.   Eyes: Negative.   Respiratory: Negative.   Cardiovascular: Negative.   Gastrointestinal: Positive for constipation and diarrhea.  Endocrine: Negative.   Genitourinary: Positive for dysuria.  Musculoskeletal: Negative.   Skin: Negative.   Allergic/Immunologic: Negative.   Neurological: Negative.   Hematological: Negative.   Psychiatric/Behavioral: Negative.   All other systems reviewed and are negative.      Objective:   Physical Exam General: No acute distress HEENT: EOMI, oral membranes moist Cards: reg rate  Chest: normal effort Abdomen: Soft, NT, ND Skin: dry, intact Extremities: no edema  Skin:visible skin intact Neuro:cognitvely intact. Strength 5/5. Normal sensory Musculoskeletal:Left shoulder remains limited with passive and active movement and is tender to palpation.  Left knee more than right knee tender with weightbearing Psych:Pt's affect is appropriate. Remains pleasant as always.    Assessment & Plan:  1. Post-traumatic arthritis left shoulder s/p 2 shoulder surgeries. Now with adhesive capsulitis left shoulder and chronic pain 2. Cervicalgia, spondylosis.  3. Right ITB syndrome  4. Depression/insomnia/anxiety  5. History of right chest pain/tingling----?resolved 6. Bilateral knee pain, Left > right. ?likely meniscal/soft tissue injury 7. Multiple drug allergies 8. Hx of urinary incontinence, ?acute UTI   Plan:  1. Continue TENS to shoulder and neck. Considering PNS 2.Frequent UTI's. Hx of bladder sling---continue amoxil per primary. May need longer duration of treatment.  Does not need to be on daily Cipro.  Urine is resistant to that   -recommend follow up with urology 3. Gabapentin, increase to 300mg  TID 4. Holding diclofenac maintain at 5.Knee pain stable 6.Increase hydrocodone to 7.5/325 one qday prn #30. We will continue the controlled substance monitoring program, this consists of regular clinic visits,  examinations, routine drug screening, pill counts as well as use of [place] Controlled Substance Reporting System. [place].   -a second rx provided for next month -UDS today 7. HEP for neckand shoulder as tolerated 8. Lexapro for depression/anxiety at 20mg  daily. .  9. I  will see her back in about 8month's time.  15 minutes of face to face patient care time were spent during this visit. All questions were encouraged and answered. Marland Kitchen Marland Kitchen

## 2017-12-25 NOTE — Patient Instructions (Signed)
PLEASE FEEL FREE TO CALL OUR OFFICE WITH ANY PROBLEMS OR QUESTIONS ([phone])      

## 2017-12-30 LAB — TOXASSURE SELECT,+ANTIDEPR,UR

## 2018-01-03 ENCOUNTER — Telehealth: Payer: Self-pay | Admitting: *Deleted

## 2018-01-03 NOTE — Telephone Encounter (Signed)
Urine drug screen for this encounter is consistent for prescribed medication 

## 2018-01-19 ENCOUNTER — Other Ambulatory Visit: Payer: Self-pay | Admitting: *Deleted

## 2018-01-19 MED ORDER — ESCITALOPRAM OXALATE 20 MG PO TABS: 20.0000 mg | ORAL_TABLET | Freq: Every day | ORAL | 0 refills | Status: DC

## 2018-01-19 MED ORDER — ALPRAZOLAM 1 MG PO TABS: ORAL_TABLET | ORAL | 5 refills | Status: DC

## 2018-01-19 MED ORDER — METHOCARBAMOL 500 MG PO TABS: 500.0000 mg | ORAL_TABLET | Freq: Two times a day (BID) | ORAL | 5 refills | Status: DC

## 2018-01-22 ENCOUNTER — Other Ambulatory Visit: Payer: Self-pay | Admitting: *Deleted

## 2018-01-22 DIAGNOSIS — M47812 Spondylosis without myelopathy or radiculopathy, cervical region: Secondary | ICD-10-CM

## 2018-01-22 MED ORDER — GABAPENTIN 300 MG PO CAPS: 300.0000 mg | ORAL_CAPSULE | Freq: Three times a day (TID) | ORAL | 3 refills | Status: DC

## 2018-01-23 MED ORDER — MECLIZINE HCL 25 MG PO TABS: ORAL_TABLET | ORAL | 2 refills | Status: DC

## 2018-01-24 ENCOUNTER — Other Ambulatory Visit: Payer: Self-pay | Admitting: Family Medicine

## 2018-01-24 ENCOUNTER — Other Ambulatory Visit: Payer: Self-pay | Admitting: *Deleted

## 2018-01-24 ENCOUNTER — Telehealth: Payer: Self-pay | Admitting: *Deleted

## 2018-01-24 DIAGNOSIS — M25562 Pain in left knee: Secondary | ICD-10-CM

## 2018-01-24 DIAGNOSIS — G8929 Other chronic pain: Secondary | ICD-10-CM

## 2018-01-24 DIAGNOSIS — M25561 Pain in right knee: Secondary | ICD-10-CM

## 2018-01-24 DIAGNOSIS — M19112 Post-traumatic osteoarthritis, left shoulder: Secondary | ICD-10-CM

## 2018-01-24 DIAGNOSIS — M47812 Spondylosis without myelopathy or radiculopathy, cervical region: Secondary | ICD-10-CM

## 2018-01-24 MED ORDER — DICLOFENAC SODIUM 75 MG PO TBEC: 75.0000 mg | DELAYED_RELEASE_TABLET | Freq: Two times a day (BID) | ORAL | 3 refills | Status: DC

## 2018-01-24 MED ORDER — ZOLPIDEM TARTRATE 5 MG PO TABS: 5.0000 mg | ORAL_TABLET | Freq: Every evening | ORAL | 5 refills | Status: DC | PRN

## 2018-01-24 NOTE — Telephone Encounter (Signed)
I sent in the zolpidem. Can not approve cipro at this time.

## 2018-01-24 NOTE — Telephone Encounter (Signed)
Fax received [facility] Requesting refills on Ciprofloxacin & Zolpidem Both not on current med list

## 2018-01-24 NOTE — Telephone Encounter (Signed)
Patient aware, script is ready, per message  left on voice mail. 

## 2018-02-04 IMAGING — MR MR PELVIS W/O CM
7 of 9 series · 29 of 48 positions shown · non-contrast
Comparison: No priors.

CLINICAL DATA: 59-year-old female with history of 2 years of bowel
incontinence and pain in the bladder for the past 2 months. History
of prior bladder sling surgery 10 years ago.

EXAM:
MRI PELVIS
TECHNIQUE: Multiplanar multisequence MR imaging of the pelvis was performed. No
intravenous contrast was administered.

[Series 2: t2_tse axial rest · axial · 3.0mm · 0.98mm/px · z∈[-119,+27]mm · 3 of 40 slices shown]
[im 1/40]
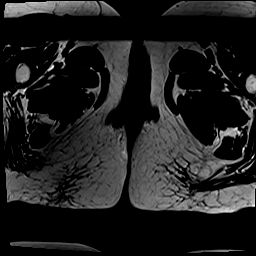
[im 20/40]
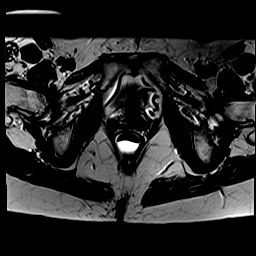
[im 40/40]
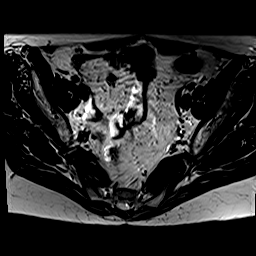

[Series 3: t2_tse_cor rest · coronal · 3.0mm · 1.37mm/px · 2 of 30 slices shown]
[im 1/30]
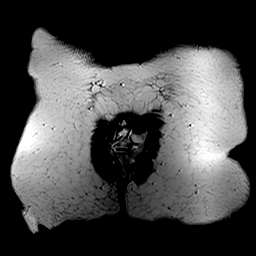
[im 30/30]
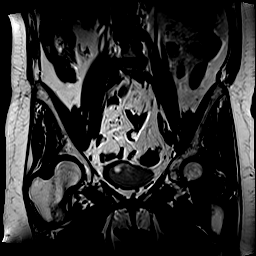

[Series 4: T2 · sagittal · 4.0mm · 0.98mm/px · 1 of 13 slices shown (1 of 5)]
[im 1/13]
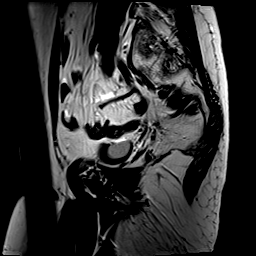

[Series 5: T2 · sagittal · 10.0mm · 0.66mm/px · 7 of 60 slices shown (2 of 5)]
[im 1/60]
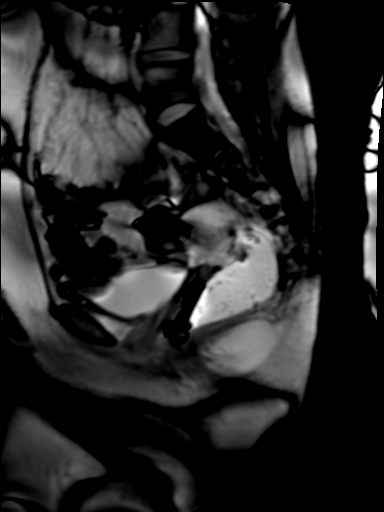
[im 10/60]
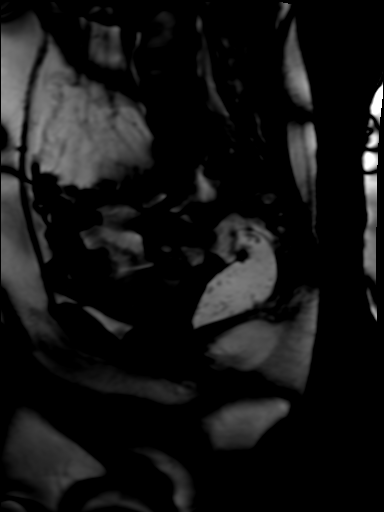
[im 20/60]
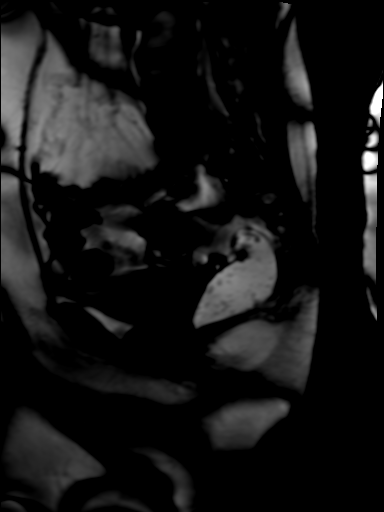
[im 30/60]
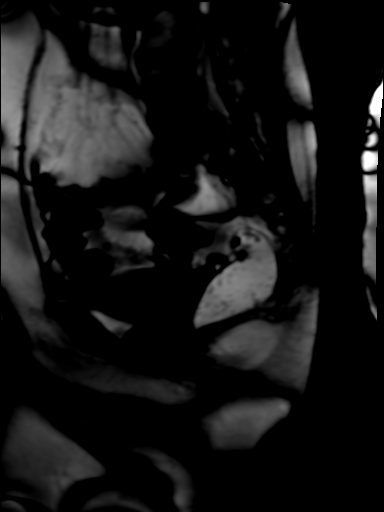
[im 40/60]
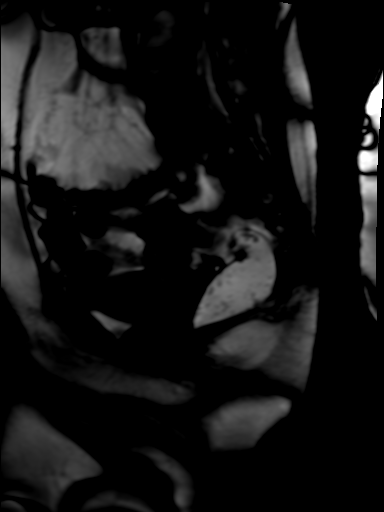
[im 50/60]
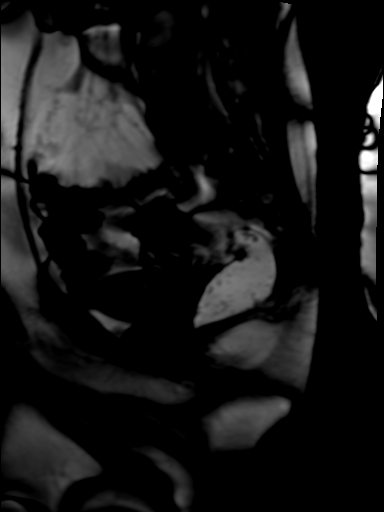
[im 60/60]
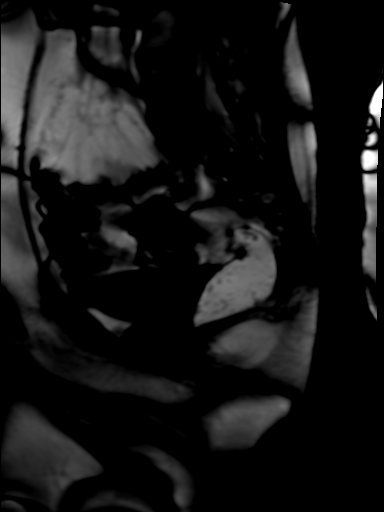

[Series 6: T2 · sagittal · 10.0mm · 0.66mm/px · 7 of 60 slices shown (3 of 5)]
[im 1/60]
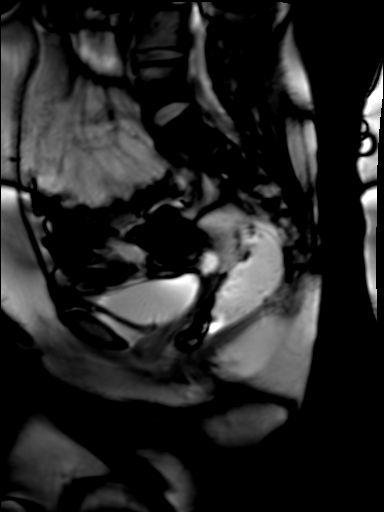
[im 10/60]
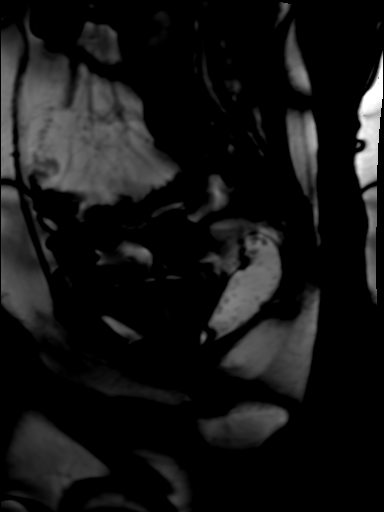
[im 20/60]
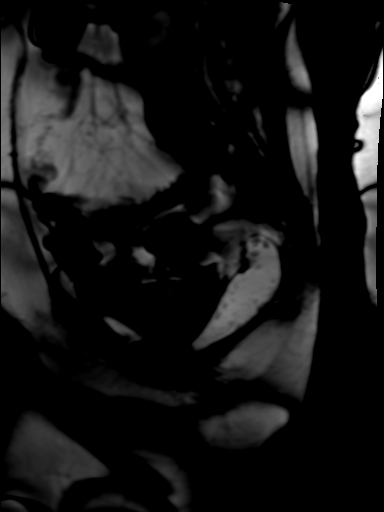
[im 30/60]
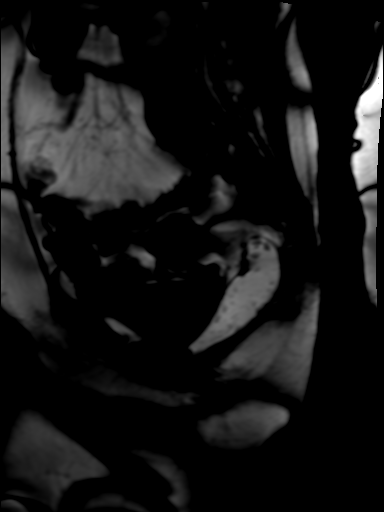
[im 40/60]
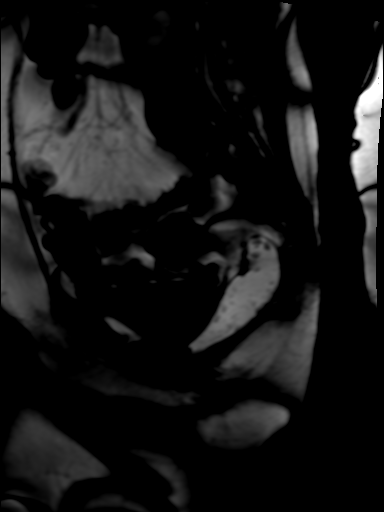
[im 50/60]
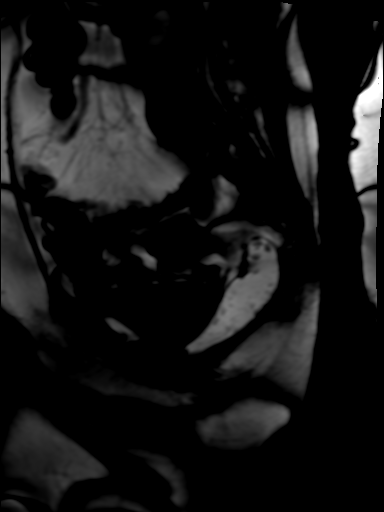
[im 60/60]
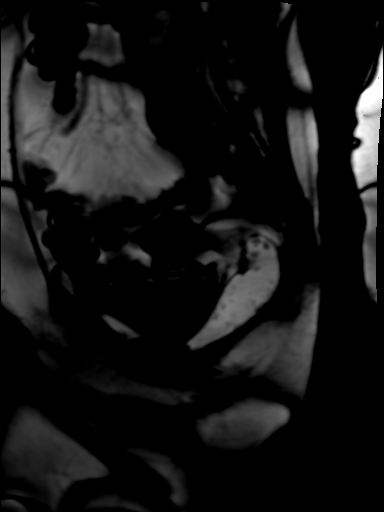

[Series 7: T2 · sagittal · 10.0mm · 0.66mm/px · 7 of 60 slices shown (4 of 5)]
[im 1/60]
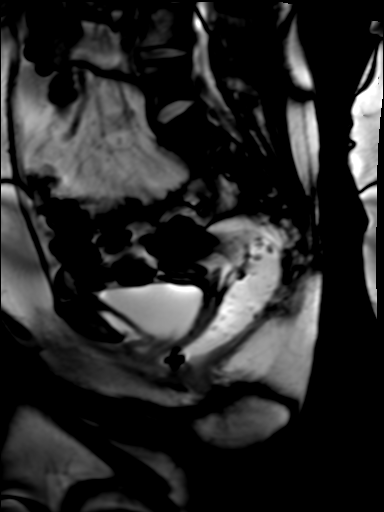
[im 10/60]
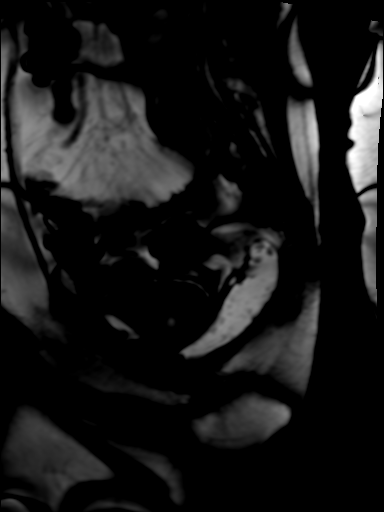
[im 20/60]
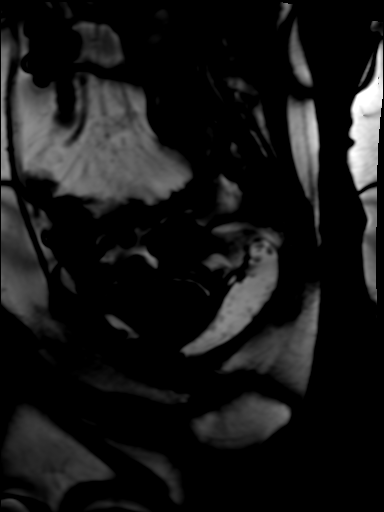
[im 30/60]
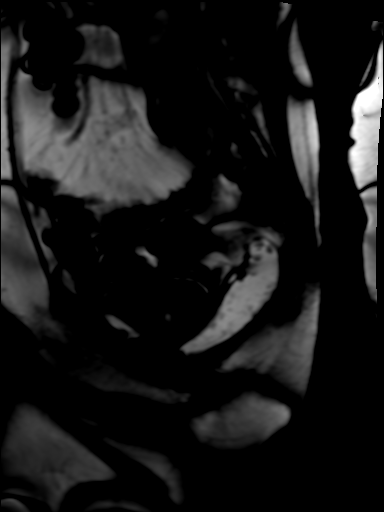
[im 40/60]
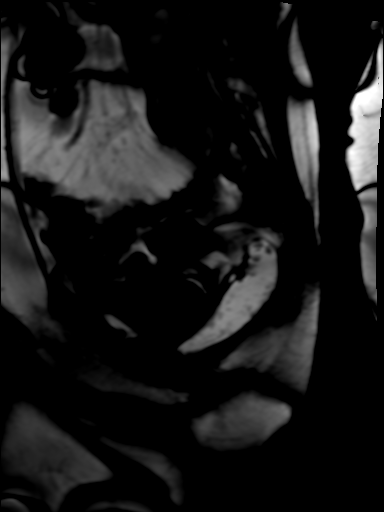
[im 50/60]
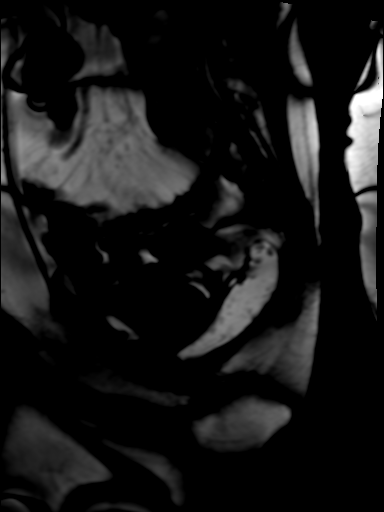
[im 60/60]
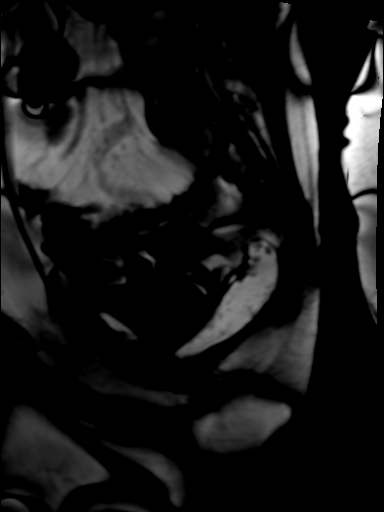

[Series 8: T2 · sagittal · 10.0mm · 0.66mm/px · 2 of 60 slices shown (5 of 5)]
[im 1/60]
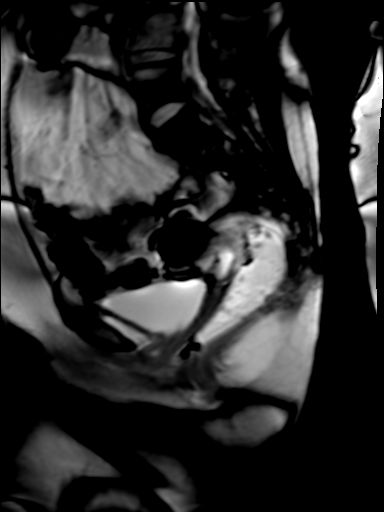
[im 10/60]
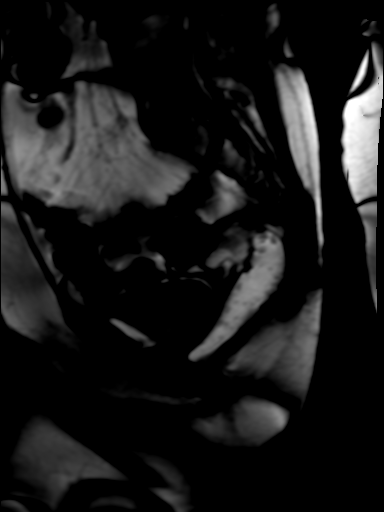

[29 of 48 positions shown; findings below may reference images not displayed]

FINDINGS: At rest, the levator ani musculature shows no significant thinning
or defects.

Measurements at rest:

H line:  5.5 cm

M line: 3.7 cm

Measurements during maximum strain/defecation:

H line: 5.2 cm

M line: 4.4 cm

ANTERIOR COMPARTMENT

Bladder base descent below PCL: 1.2 cm

Cystocele grade: Mild 1-3cm; Moderate 3-6cm; Severe >6cm

Anterior urethral hypermobility >30 degrees: Urethra poorly
visualized on today's examination.

Anterior aspect of the urinary bladder appears tethered, presumably
from prior bladder sling surgery.

MIDDLE COMPARTMENT

Vaginal apex/cervix descent below PCL: None (vaginal apex 0.7 cm
above the PCL )

Grade:  Mild 0-3cm; Moderate 3-6cm; Severe >6cm

Enterocele or Mesenterocele:  None

Grade:  Mild 0-3cm; Moderate 3-6cm; Severe >6cm

POSTERIOR COMPARTMENT

Rectocele: None

Levator plate angle >10 degrees from PCL:  No - Yes

Other findings during maximum strain/defecation: None
IMPRESSION: 1. Mild anterior compartment prolapse with mild cystocele during
maximal strain.
2. Apparent tethering of the anterior aspect of the urinary bladder,
presumably from prior bladder sling surgery.

## 2018-02-26 ENCOUNTER — Encounter: Payer: Medicare Other | Admitting: Physical Medicine & Rehabilitation

## 2018-02-27 ENCOUNTER — Other Ambulatory Visit: Payer: Self-pay

## 2018-02-27 ENCOUNTER — Encounter: Payer: Self-pay | Admitting: Registered Nurse

## 2018-02-27 ENCOUNTER — Encounter: Payer: Medicare Other | Attending: Physical Medicine & Rehabilitation | Admitting: Registered Nurse

## 2018-02-27 VITALS — BP 113/71 | HR 90 | Ht 66.0 in | Wt 162.0 lb

## 2018-02-27 DIAGNOSIS — Z90710 Acquired absence of both cervix and uterus: Secondary | ICD-10-CM | POA: Insufficient documentation

## 2018-02-27 DIAGNOSIS — Z79891 Long term (current) use of opiate analgesic: Secondary | ICD-10-CM

## 2018-02-27 DIAGNOSIS — J449 Chronic obstructive pulmonary disease, unspecified: Secondary | ICD-10-CM | POA: Insufficient documentation

## 2018-02-27 DIAGNOSIS — Z825 Family history of asthma and other chronic lower respiratory diseases: Secondary | ICD-10-CM | POA: Insufficient documentation

## 2018-02-27 DIAGNOSIS — M25562 Pain in left knee: Secondary | ICD-10-CM | POA: Diagnosis not present

## 2018-02-27 DIAGNOSIS — E785 Hyperlipidemia, unspecified: Secondary | ICD-10-CM | POA: Diagnosis not present

## 2018-02-27 DIAGNOSIS — F329 Major depressive disorder, single episode, unspecified: Secondary | ICD-10-CM | POA: Diagnosis not present

## 2018-02-27 DIAGNOSIS — M7502 Adhesive capsulitis of left shoulder: Secondary | ICD-10-CM | POA: Diagnosis not present

## 2018-02-27 DIAGNOSIS — Z801 Family history of malignant neoplasm of trachea, bronchus and lung: Secondary | ICD-10-CM | POA: Diagnosis not present

## 2018-02-27 DIAGNOSIS — K219 Gastro-esophageal reflux disease without esophagitis: Secondary | ICD-10-CM | POA: Diagnosis not present

## 2018-02-27 DIAGNOSIS — Z87891 Personal history of nicotine dependence: Secondary | ICD-10-CM | POA: Insufficient documentation

## 2018-02-27 DIAGNOSIS — M542 Cervicalgia: Secondary | ICD-10-CM | POA: Diagnosis not present

## 2018-02-27 DIAGNOSIS — Z841 Family history of disorders of kidney and ureter: Secondary | ICD-10-CM | POA: Diagnosis not present

## 2018-02-27 DIAGNOSIS — G4700 Insomnia, unspecified: Secondary | ICD-10-CM | POA: Insufficient documentation

## 2018-02-27 DIAGNOSIS — M7631 Iliotibial band syndrome, right leg: Secondary | ICD-10-CM | POA: Insufficient documentation

## 2018-02-27 DIAGNOSIS — G894 Chronic pain syndrome: Secondary | ICD-10-CM

## 2018-02-27 DIAGNOSIS — Z833 Family history of diabetes mellitus: Secondary | ICD-10-CM | POA: Insufficient documentation

## 2018-02-27 DIAGNOSIS — M25561 Pain in right knee: Secondary | ICD-10-CM | POA: Insufficient documentation

## 2018-02-27 DIAGNOSIS — Z8042 Family history of malignant neoplasm of prostate: Secondary | ICD-10-CM | POA: Insufficient documentation

## 2018-02-27 DIAGNOSIS — M19012 Primary osteoarthritis, left shoulder: Secondary | ICD-10-CM | POA: Insufficient documentation

## 2018-02-27 DIAGNOSIS — M5412 Radiculopathy, cervical region: Secondary | ICD-10-CM

## 2018-02-27 DIAGNOSIS — F419 Anxiety disorder, unspecified: Secondary | ICD-10-CM | POA: Insufficient documentation

## 2018-02-27 DIAGNOSIS — Z8371 Family history of colonic polyps: Secondary | ICD-10-CM | POA: Diagnosis not present

## 2018-02-27 DIAGNOSIS — M7918 Myalgia, other site: Secondary | ICD-10-CM | POA: Diagnosis not present

## 2018-02-27 DIAGNOSIS — M47812 Spondylosis without myelopathy or radiculopathy, cervical region: Secondary | ICD-10-CM

## 2018-02-27 DIAGNOSIS — Z85828 Personal history of other malignant neoplasm of skin: Secondary | ICD-10-CM | POA: Diagnosis not present

## 2018-02-27 DIAGNOSIS — G8929 Other chronic pain: Secondary | ICD-10-CM | POA: Diagnosis not present

## 2018-02-27 DIAGNOSIS — Z8249 Family history of ischemic heart disease and other diseases of the circulatory system: Secondary | ICD-10-CM | POA: Insufficient documentation

## 2018-02-27 DIAGNOSIS — Z5181 Encounter for therapeutic drug level monitoring: Secondary | ICD-10-CM

## 2018-02-27 MED ORDER — HYDROCODONE-ACETAMINOPHEN 7.5-325 MG PO TABS
1.0000 | ORAL_TABLET | Freq: Four times a day (QID) | ORAL | 0 refills | Status: DC | PRN
Start: 2018-02-27 — End: 2018-04-16

## 2018-02-27 NOTE — Progress Notes (Signed)
Subjective:    Patient ID: Cassandra [name], female    DOB: [birth year], 62 y.o.   MRN: [id number]  HPI: Ms. Cassandra [name] is a 62 year old female who returns for follow up appointment for chronic pain and medication refill. She states her pain is located in her neck radiating into her left shoulder, left arm and left hand with tingling and numbness. She rates her pain 9. He current exercise regime is walking.   [name] Morphine Equivalent is 32.14. She is also prescribed Alprazolam by Dr. Livia Snellen. We have discussed the black box warning of using opioids and benzodiazepines. I highlighted the dangers of using these drugs together and discussed the adverse events including respiratory suppression, overdose, cognitive impairment and importance of compliance with current regimen. We will continue to monitor and adjust as indicated.   Last UDS was Performed 12/25/2017, it was consistent.    Pain Inventory Average Pain 7 Pain Right Now 9 My pain is dull and aching  In the last 24 hours, has pain interfered with the following? General activity 7 Relation with others 7 Enjoyment of life 8 What TIME of day is your pain at its worst? all Sleep (in general) Poor  Pain is worse with: bending and inactivity Pain improves with: medication and TENS Relief from Meds: 6  Mobility walk without assistance ability to climb steps?  yes do you drive?  yes  Function not employed: date last employed 2007 disabled: date disabled 2007 I need assistance with the following:  household duties and shopping  Neuro/Psych bladder control problems bowel control problems weakness numbness tremor tingling spasms dizziness depression anxiety  Prior Studies Any changes since last visit?  no  Physicians involved in your care Any changes since last visit?  no   Family History  Problem Relation Age of Onset  . Coronary artery disease Mother   . Diabetes Mother        borderline  . Kidney disease  Mother        stage 4  . Coronary artery disease Father   . Prostate cancer Father   . Lung disease Father        black lung  . Liver cancer Maternal Grandmother   . Liver disease Maternal Grandmother   . Polymyalgia rheumatica Sister   . Heart disease Sister   . Coronary artery disease Brother        heart attack  . COPD Brother   . Colon polyps Sister   . Lung cancer Paternal Grandmother   . Colon cancer Unknown        Both sides   Social History   Socioeconomic History  . Marital status: Married    Spouse name: Not on file  . Number of children: 1  . Years of education: Not on file  . Highest education level: GED or equivalent  Occupational History  . Occupation: Agricultural engineer  Social Needs  . Financial resource strain: Not hard at all  . Food insecurity:    Worry: Never true    Inability: Never true  . Transportation needs:    Medical: No    Non-medical: No  Tobacco Use  . Smoking status: Former Smoker    Packs/day: 1.00    Years: 42.00    Pack years: 42.00    Types: Cigarettes    Last attempt to quit: 06/01/2015    Years since quitting: 2.7  . Smokeless tobacco: Never Used  . Tobacco comment: would like to  try chantix, but has not started on it due to warnings related to depression  Substance and Sexual Activity  . Alcohol use: No    Alcohol/week: 0.0 oz  . Drug use: No  . Sexual activity: Yes  Lifestyle  . Physical activity:    Days per week: 3 days    Minutes per session: 30 min  . Stress: Not at all  Relationships  . Social connections:    Talks on phone: More than three times a week    Gets together: More than three times a week    Attends religious service: 1 to 4 times per year    Active member of club or organization: No    Attends meetings of clubs or organizations: Never    Relationship status: Married  Other Topics Concern  . Not on file  Social History Narrative  . Not on file   Past Surgical History:  Procedure Laterality Date  .  ABDOMINAL HYSTERECTOMY     age 1  . ANAL RECTAL MANOMETRY N/A 12/09/2015   Procedure: ANO RECTAL MANOMETRY;  Surgeon: Mauri Pole, MD;  Location: [facility];  Service: Endoscopy;  Laterality: N/A;  . BLADDER REPAIR     sling/with perforation  . CESAREAN SECTION     1 time  . KNEE ARTHROSCOPY Left   . ruptured uterus    . SHOULDER ARTHROSCOPY     multiple   Past Medical History:  Diagnosis Date  . Anxiety   . Bowel habit changes    excessive gas /frequent BM's with change in shape with pus  . Chronic left shoulder pain    frozen   . Chronic pain    left upper shoulder ,neck and back  . COPD (chronic obstructive pulmonary disease) ([place])    Dr. Redge Gainer  . Depression   . GERD (gastroesophageal reflux disease)   . Hyperlipidemia   . Maternal complication related to childbirth    Sepsis, staph  . Meniere's disease   . Nicotine addiction    2 packs per day   . Rapid or irregular heartbeat   . Skin cancer    Rt. leg, squamous cell  . Tubular adenoma of colon 01/31/2000   Dr. Richmond Campbell  . Weakness of pelvic floor    prolapse -  MRI from 2017   BP 113/71   Pulse 90   Ht 5\' 6"  (1.676 m)   Wt 162 lb (73.5 kg)   SpO2 96%   BMI 26.15 kg/m   Opioid Risk Score:   Fall Risk Score:  `1  Depression screen PHQ 2/9  Depression screen [facility] 2/9 02/27/2018 12/25/2017 12/20/2017 12/19/2017 08/22/2017 06/20/2017 05/16/2017  Decreased Interest 1 0 2 2 0 1 2  Down, Depressed, Hopeless 1 0 0 0 0 1 1  PHQ - 2 Score 2 0 2 2 0 2 3  Altered sleeping - 0 2 2 - 2 -  Tired, decreased energy - 0 3 3 - 2 -  Change in appetite - 0 2 2 - 2 -  Feeling bad or failure about yourself  - 0 0 0 - 0 -  Trouble concentrating - 0 1 1 - 1 -  Moving slowly or fidgety/restless - 0 0 0 - 1 -  Suicidal thoughts - 0 0 0 - 0 -  PHQ-9 Score - 0 10 10 - 10 -  Some recent data might be hidden   Review of Systems  Constitutional: Positive for unexpected weight  change.       Night sweats  HENT:  Negative.   Eyes: Negative.   Respiratory: Negative.   Cardiovascular: Negative.   Gastrointestinal: Positive for abdominal pain and constipation.  Endocrine: Negative.   Genitourinary: Negative for difficulty urinating.  Musculoskeletal: Negative.   Skin: Negative.   Allergic/Immunologic: Negative.   Neurological: Negative.   Hematological: Negative.   Psychiatric/Behavioral: Negative.   All other systems reviewed and are negative.      Objective:   Physical Exam  Constitutional: She is oriented to person, place, and time. She appears well-developed and well-nourished.  HENT:  Head: Normocephalic and atraumatic.  Neck: Normal range of motion. Neck supple.  Cervical Paraspinal Tenderness: C-5-C-6 Left Side  Cardiovascular: Normal rate and regular rhythm.  Pulmonary/Chest: Effort normal and breath sounds normal.  Musculoskeletal:  Normal Muscle Bulk and Muscle Testing Reveals: Upper Extremities: Right: Full ROM and Muscle Strength 5/5 Left: Decreased ROM 30 Degrees and Muscle Strength 4/5 Thoracic Hypersensitivity: T-1-T-7 Mainly Left Side Lower Extremities: Full ROM and Muscle Strength 5/5 Arises from chair with ease Narrow Based Gait  Neurological: She is alert and oriented to person, place, and time.  Skin: Skin is warm and dry.  Psychiatric: She has a normal mood and affect. Her behavior is normal.  Nursing note and vitals reviewed.         Assessment & Plan:  1.Post-traumatic arthritis left shoulder s/p 2 shoulder surgeries. Now with adhesive capsulitis left shoulder. 02/27/2018 Refilled:Hydrocodone 7/5/325 mg one tablet every 6 hours as needed for pain #30.We will continue the opioid monitoring program, this consists of regular clinic visits, examinations, urine drug screen, pill counts as well as use of [place] Controlled Substance Reporting System. 2. Cervicalgia, spondylosis/ Cervical Radiculitis: Continue Current Medication Regime.Robaxin, Voltaren, and  Gabapentin. 02/27/2018 3. Depression/insomnia/anxiety: Continue Lexapro. Continue Ambien 5 mg HS #30. Dr. Livia Snellen prescribing Xanax. 02/27/2018  30 minutes of face to face patient care time was spent during this visit. All questions were encouraged and answered.   F/U in 59month

## 2018-03-01 NOTE — Telephone Encounter (Signed)
Error in charting.

## 2018-03-19 ENCOUNTER — Other Ambulatory Visit: Payer: Self-pay | Admitting: Family Medicine

## 2018-03-23 ENCOUNTER — Telehealth: Payer: Self-pay

## 2018-03-23 NOTE — Telephone Encounter (Signed)
Placed a call to Ms. Forness, She will be going out of town on Sunday. I will give permission for a early refill. This provider called the pharmacy. Ms. [place] was notified of the above.

## 2018-03-23 NOTE — Telephone Encounter (Signed)
Pt called requesting refill on Hydrocodone/APAP, last filled 02/27/18 according to PMP aware

## 2018-04-16 ENCOUNTER — Encounter: Payer: Medicare Other | Attending: Physical Medicine & Rehabilitation | Admitting: Registered Nurse

## 2018-04-16 ENCOUNTER — Encounter: Payer: Self-pay | Admitting: Registered Nurse

## 2018-04-16 VITALS — BP 142/77 | HR 72 | Resp 14 | Ht 67.0 in | Wt 159.0 lb

## 2018-04-16 DIAGNOSIS — M25561 Pain in right knee: Secondary | ICD-10-CM | POA: Insufficient documentation

## 2018-04-16 DIAGNOSIS — M7502 Adhesive capsulitis of left shoulder: Secondary | ICD-10-CM | POA: Diagnosis not present

## 2018-04-16 DIAGNOSIS — Z841 Family history of disorders of kidney and ureter: Secondary | ICD-10-CM | POA: Diagnosis not present

## 2018-04-16 DIAGNOSIS — F329 Major depressive disorder, single episode, unspecified: Secondary | ICD-10-CM | POA: Diagnosis not present

## 2018-04-16 DIAGNOSIS — E785 Hyperlipidemia, unspecified: Secondary | ICD-10-CM | POA: Diagnosis not present

## 2018-04-16 DIAGNOSIS — Z825 Family history of asthma and other chronic lower respiratory diseases: Secondary | ICD-10-CM | POA: Insufficient documentation

## 2018-04-16 DIAGNOSIS — M47812 Spondylosis without myelopathy or radiculopathy, cervical region: Secondary | ICD-10-CM | POA: Diagnosis not present

## 2018-04-16 DIAGNOSIS — Z8249 Family history of ischemic heart disease and other diseases of the circulatory system: Secondary | ICD-10-CM | POA: Diagnosis not present

## 2018-04-16 DIAGNOSIS — K219 Gastro-esophageal reflux disease without esophagitis: Secondary | ICD-10-CM | POA: Diagnosis not present

## 2018-04-16 DIAGNOSIS — F419 Anxiety disorder, unspecified: Secondary | ICD-10-CM | POA: Diagnosis not present

## 2018-04-16 DIAGNOSIS — Z8042 Family history of malignant neoplasm of prostate: Secondary | ICD-10-CM | POA: Diagnosis not present

## 2018-04-16 DIAGNOSIS — G4700 Insomnia, unspecified: Secondary | ICD-10-CM | POA: Diagnosis not present

## 2018-04-16 DIAGNOSIS — M5412 Radiculopathy, cervical region: Secondary | ICD-10-CM

## 2018-04-16 DIAGNOSIS — Z90710 Acquired absence of both cervix and uterus: Secondary | ICD-10-CM | POA: Insufficient documentation

## 2018-04-16 DIAGNOSIS — M19012 Primary osteoarthritis, left shoulder: Secondary | ICD-10-CM | POA: Insufficient documentation

## 2018-04-16 DIAGNOSIS — Z5181 Encounter for therapeutic drug level monitoring: Secondary | ICD-10-CM

## 2018-04-16 DIAGNOSIS — Z8371 Family history of colonic polyps: Secondary | ICD-10-CM | POA: Diagnosis not present

## 2018-04-16 DIAGNOSIS — M25562 Pain in left knee: Secondary | ICD-10-CM

## 2018-04-16 DIAGNOSIS — Z85828 Personal history of other malignant neoplasm of skin: Secondary | ICD-10-CM | POA: Insufficient documentation

## 2018-04-16 DIAGNOSIS — Z833 Family history of diabetes mellitus: Secondary | ICD-10-CM | POA: Insufficient documentation

## 2018-04-16 DIAGNOSIS — G8929 Other chronic pain: Secondary | ICD-10-CM | POA: Insufficient documentation

## 2018-04-16 DIAGNOSIS — Z801 Family history of malignant neoplasm of trachea, bronchus and lung: Secondary | ICD-10-CM | POA: Diagnosis not present

## 2018-04-16 DIAGNOSIS — G894 Chronic pain syndrome: Secondary | ICD-10-CM

## 2018-04-16 DIAGNOSIS — J449 Chronic obstructive pulmonary disease, unspecified: Secondary | ICD-10-CM | POA: Diagnosis not present

## 2018-04-16 DIAGNOSIS — Z87891 Personal history of nicotine dependence: Secondary | ICD-10-CM | POA: Insufficient documentation

## 2018-04-16 DIAGNOSIS — M7631 Iliotibial band syndrome, right leg: Secondary | ICD-10-CM | POA: Insufficient documentation

## 2018-04-16 DIAGNOSIS — Z79891 Long term (current) use of opiate analgesic: Secondary | ICD-10-CM

## 2018-04-16 DIAGNOSIS — M542 Cervicalgia: Secondary | ICD-10-CM

## 2018-04-16 DIAGNOSIS — M7918 Myalgia, other site: Secondary | ICD-10-CM

## 2018-04-16 MED ORDER — HYDROCODONE-ACETAMINOPHEN 7.5-325 MG PO TABS: 1.0000 | ORAL_TABLET | Freq: Four times a day (QID) | ORAL | 0 refills | Status: DC | PRN

## 2018-04-16 NOTE — Progress Notes (Signed)
Subjective:    Patient ID: Cassandra [name], female    DOB: 1956/01/31, 62 y.o.   MRN: [id number]  HPI: Ms. Cassandra [name] is a 62 year old female who returns for follow up appointment for chronic pain and medication refill. She states her pain is located in her mid back. She rates her pain 7. Her current exercise regime is walking.   Ms. [name] Morphine Equivalent is 28.13 MME. She is also prescribed Alprazolam by Dr. Livia Snellen .We have discussed the black box warning of using opioids and benzodiazepines. I highlighted the dangers of using these drugs together and discussed the adverse events including respiratory suppression, overdose, cognitive impairment and importance of compliance with current regimen. We will continue to monitor and adjust as indicated.   Last UDS was Performed on 12/25/2017, it was consistent.   Pain Inventory Average Pain 7 Pain Right Now 7 My pain is constant, sharp, burning, dull, stabbing, tingling and aching  In the last 24 hours, has pain interfered with the following? General activity 7 Relation with others 7 Enjoyment of life 7 What TIME of day is your pain at its worst? morning, evening, night Sleep (in general) Poor  Pain is worse with: bending, sitting, inactivity and some activites Pain improves with: heat/ice, therapy/exercise, pacing activities, medication, TENS and hot bath Relief from Meds: 5  Mobility walk without assistance ability to climb steps?  yes do you drive?  yes needs help with transfers  Function not employed: date last employed . disabled: date disabled . I need assistance with the following:  dressing, household duties and shopping  Neuro/Psych bladder control problems bowel control problems weakness numbness tremor tingling trouble walking spasms dizziness depression  Prior Studies Any changes since last visit?  no  Physicians involved in your care Any changes since last visit?  no   Family History  Problem  Relation Age of Onset  . Coronary artery disease Mother   . Diabetes Mother        borderline  . Kidney disease Mother        stage 4  . Coronary artery disease Father   . Prostate cancer Father   . Lung disease Father        black lung  . Liver cancer Maternal Grandmother   . Liver disease Maternal Grandmother   . Polymyalgia rheumatica Sister   . Heart disease Sister   . Coronary artery disease Brother        heart attack  . COPD Brother   . Colon polyps Sister   . Lung cancer Paternal Grandmother   . Colon cancer Unknown        Both sides   Social History   Socioeconomic History  . Marital status: Married    Spouse name: Not on file  . Number of children: 1  . Years of education: Not on file  . Highest education level: GED or equivalent  Occupational History  . Occupation: Agricultural engineer  Social Needs  . Financial resource strain: Not hard at all  . Food insecurity:    Worry: Never true    Inability: Never true  . Transportation needs:    Medical: No    Non-medical: No  Tobacco Use  . Smoking status: Former Smoker    Packs/day: 1.00    Years: 42.00    Pack years: 42.00    Types: Cigarettes    Last attempt to quit: 06/01/2015    Years since quitting: 2.8  . Smokeless  tobacco: Never Used  . Tobacco comment: would like to try chantix, but has not started on it due to warnings related to depression  Substance and Sexual Activity  . Alcohol use: No    Alcohol/week: 0.0 oz  . Drug use: No  . Sexual activity: Yes  Lifestyle  . Physical activity:    Days per week: 3 days    Minutes per session: 30 min  . Stress: Not at all  Relationships  . Social connections:    Talks on phone: More than three times a week    Gets together: More than three times a week    Attends religious service: 1 to 4 times per year    Active member of club or organization: No    Attends meetings of clubs or organizations: Never    Relationship status: Married  Other Topics Concern  .  Not on file  Social History Narrative  . Not on file   Past Surgical History:  Procedure Laterality Date  . ABDOMINAL HYSTERECTOMY     age 87  . ANAL RECTAL MANOMETRY N/A 12/09/2015   Procedure: ANO RECTAL MANOMETRY;  Surgeon: Mauri Pole, MD;  Location: [facility];  Service: Endoscopy;  Laterality: N/A;  . BLADDER REPAIR     sling/with perforation  . CESAREAN SECTION     1 time  . KNEE ARTHROSCOPY Left   . ruptured uterus    . SHOULDER ARTHROSCOPY     multiple   Past Medical History:  Diagnosis Date  . Anxiety   . Bowel habit changes    excessive gas /frequent BM's with change in shape with pus  . Chronic left shoulder pain    frozen   . Chronic pain    left upper shoulder ,neck and back  . COPD (chronic obstructive pulmonary disease) ([place])    Dr. Redge Gainer  . Depression   . GERD (gastroesophageal reflux disease)   . Hyperlipidemia   . Maternal complication related to childbirth    Sepsis, staph  . Meniere's disease   . Nicotine addiction    2 packs per day   . Rapid or irregular heartbeat   . Skin cancer    Rt. leg, squamous cell  . Tubular adenoma of colon 01/31/2000   Dr. Richmond Campbell  . Weakness of pelvic floor    prolapse -  MRI from 2017   BP (!) 142/77 (BP Location: Left Arm, Patient Position: Sitting, Cuff Size: Normal)   Pulse 72   Resp 14   Ht 5\' 7"  (1.702 m)   Wt 159 lb (72.1 kg)   SpO2 99%   BMI 24.90 kg/m   Opioid Risk Score:   Fall Risk Score:  `1  Depression screen PHQ 2/9  Depression screen [facility] 2/9 02/27/2018 12/25/2017 12/20/2017 12/19/2017 08/22/2017 06/20/2017 05/16/2017  Decreased Interest 1 0 2 2 0 1 2  Down, Depressed, Hopeless 1 0 0 0 0 1 1  PHQ - 2 Score 2 0 2 2 0 2 3  Altered sleeping - 0 2 2 - 2 -  Tired, decreased energy - 0 3 3 - 2 -  Change in appetite - 0 2 2 - 2 -  Feeling bad or failure about yourself  - 0 0 0 - 0 -  Trouble concentrating - 0 1 1 - 1 -  Moving slowly or fidgety/restless - 0 0 0 - 1 -  Suicidal  thoughts - 0 0 0 - 0 -  PHQ-9  Score - 0 10 10 - 10 -  Some recent data might be hidden    Review of Systems  Constitutional: Positive for appetite change, diaphoresis and unexpected weight change.  HENT: Negative.   Eyes: Negative.   Respiratory: Negative.   Cardiovascular: Positive for leg swelling.  Gastrointestinal: Positive for constipation, nausea and vomiting.  Genitourinary: Positive for difficulty urinating.  Musculoskeletal: Positive for arthralgias, back pain and neck pain.  Skin: Negative.   Allergic/Immunologic: Negative.   Neurological: Positive for dizziness, tremors, weakness, light-headedness and numbness.       Tingling   Hematological: Negative.   Psychiatric/Behavioral: Positive for dysphoric mood.       Objective:   Physical Exam  Constitutional: She is oriented to person, place, and time. She appears well-developed and well-nourished.  HENT:  Head: Normocephalic and atraumatic.  Neck: Normal range of motion. Neck supple.  Cardiovascular: Normal rate and regular rhythm.  Pulmonary/Chest: Effort normal and breath sounds normal.  Musculoskeletal:  Normal Muscle Bulk and Muscle Testing Reveals:  Upper Extremities: Right: Full ROM and Muscle Strength 5/5 Left: Decreased ROM 45 Degrees and Muscle Strength 4/5  Left AC Joint Tenderness Thoracic Hypersensitivity: T-1-T-7 Lower Extremities: Full ROM and Muscle Strength 5/5 Arises from chair with ease Narrow Based gait  Neurological: She is alert and oriented to person, place, and time.  Skin: Skin is warm and dry.  Psychiatric: She has a normal mood and affect. Her behavior is normal.  Nursing note and vitals reviewed.         Assessment & Plan:  1.Post-traumatic arthritis left shoulder s/p 2 shoulder surgeries. Now with adhesive capsulitis left shoulder. 04/16/2018 Refilled:Hydrocodone 7/5/325 mg one tablet every 6 hours as needed for pain #30. Second script e-scribe for the following month. We will  continue the opioid monitoring program, this consists of regular clinic visits, examinations, urine drug screen, pill counts as well as use of [place] Controlled Substance Reporting System. 2. Cervicalgia, spondylosis/ Cervical Radiculitis: Continue Current Medication Regime.Robaxin, Voltaren, and Gabapentin. 04/16/2018 3. Depression/insomnia/anxiety: Continue Lexapro. Continue Ambien 5 mg HS #30. Dr. Livia Snellen prescribing Xanax. 04/16/2018 4. Myofascial Pain: Continue Robaxin. 04/16/2018.  30minutes of face to face patient care time was spent during this visit. All questions were encouraged and answered.   F/U in 42months

## 2018-06-19 ENCOUNTER — Encounter: Payer: Self-pay | Admitting: Registered Nurse

## 2018-06-19 ENCOUNTER — Other Ambulatory Visit: Payer: Self-pay

## 2018-06-19 ENCOUNTER — Encounter: Payer: Medicare Other | Attending: Physical Medicine & Rehabilitation | Admitting: Registered Nurse

## 2018-06-19 VITALS — BP 126/73 | HR 66 | Ht 66.0 in | Wt 157.0 lb

## 2018-06-19 DIAGNOSIS — G4700 Insomnia, unspecified: Secondary | ICD-10-CM | POA: Diagnosis not present

## 2018-06-19 DIAGNOSIS — M7502 Adhesive capsulitis of left shoulder: Secondary | ICD-10-CM

## 2018-06-19 DIAGNOSIS — Z79891 Long term (current) use of opiate analgesic: Secondary | ICD-10-CM | POA: Diagnosis not present

## 2018-06-19 DIAGNOSIS — Z90710 Acquired absence of both cervix and uterus: Secondary | ICD-10-CM | POA: Insufficient documentation

## 2018-06-19 DIAGNOSIS — Z841 Family history of disorders of kidney and ureter: Secondary | ICD-10-CM | POA: Insufficient documentation

## 2018-06-19 DIAGNOSIS — J449 Chronic obstructive pulmonary disease, unspecified: Secondary | ICD-10-CM | POA: Diagnosis not present

## 2018-06-19 DIAGNOSIS — Z8042 Family history of malignant neoplasm of prostate: Secondary | ICD-10-CM | POA: Insufficient documentation

## 2018-06-19 DIAGNOSIS — Z801 Family history of malignant neoplasm of trachea, bronchus and lung: Secondary | ICD-10-CM | POA: Insufficient documentation

## 2018-06-19 DIAGNOSIS — M25562 Pain in left knee: Secondary | ICD-10-CM | POA: Diagnosis not present

## 2018-06-19 DIAGNOSIS — G8929 Other chronic pain: Secondary | ICD-10-CM | POA: Diagnosis not present

## 2018-06-19 DIAGNOSIS — F329 Major depressive disorder, single episode, unspecified: Secondary | ICD-10-CM | POA: Diagnosis not present

## 2018-06-19 DIAGNOSIS — Z8249 Family history of ischemic heart disease and other diseases of the circulatory system: Secondary | ICD-10-CM | POA: Insufficient documentation

## 2018-06-19 DIAGNOSIS — G894 Chronic pain syndrome: Secondary | ICD-10-CM

## 2018-06-19 DIAGNOSIS — M19112 Post-traumatic osteoarthritis, left shoulder: Secondary | ICD-10-CM

## 2018-06-19 DIAGNOSIS — Z825 Family history of asthma and other chronic lower respiratory diseases: Secondary | ICD-10-CM | POA: Insufficient documentation

## 2018-06-19 DIAGNOSIS — Z85828 Personal history of other malignant neoplasm of skin: Secondary | ICD-10-CM | POA: Insufficient documentation

## 2018-06-19 DIAGNOSIS — Z833 Family history of diabetes mellitus: Secondary | ICD-10-CM | POA: Diagnosis not present

## 2018-06-19 DIAGNOSIS — Z5181 Encounter for therapeutic drug level monitoring: Secondary | ICD-10-CM

## 2018-06-19 DIAGNOSIS — Z8371 Family history of colonic polyps: Secondary | ICD-10-CM | POA: Diagnosis not present

## 2018-06-19 DIAGNOSIS — M542 Cervicalgia: Secondary | ICD-10-CM | POA: Diagnosis not present

## 2018-06-19 DIAGNOSIS — M19012 Primary osteoarthritis, left shoulder: Secondary | ICD-10-CM | POA: Insufficient documentation

## 2018-06-19 DIAGNOSIS — M47812 Spondylosis without myelopathy or radiculopathy, cervical region: Secondary | ICD-10-CM | POA: Diagnosis not present

## 2018-06-19 DIAGNOSIS — M25561 Pain in right knee: Secondary | ICD-10-CM

## 2018-06-19 DIAGNOSIS — F419 Anxiety disorder, unspecified: Secondary | ICD-10-CM | POA: Insufficient documentation

## 2018-06-19 DIAGNOSIS — Z87891 Personal history of nicotine dependence: Secondary | ICD-10-CM | POA: Insufficient documentation

## 2018-06-19 DIAGNOSIS — M5412 Radiculopathy, cervical region: Secondary | ICD-10-CM

## 2018-06-19 DIAGNOSIS — E785 Hyperlipidemia, unspecified: Secondary | ICD-10-CM | POA: Diagnosis not present

## 2018-06-19 DIAGNOSIS — M7631 Iliotibial band syndrome, right leg: Secondary | ICD-10-CM | POA: Insufficient documentation

## 2018-06-19 DIAGNOSIS — M7918 Myalgia, other site: Secondary | ICD-10-CM

## 2018-06-19 DIAGNOSIS — K219 Gastro-esophageal reflux disease without esophagitis: Secondary | ICD-10-CM | POA: Diagnosis not present

## 2018-06-19 MED ORDER — DICLOFENAC SODIUM 75 MG PO TBEC: 75.0000 mg | DELAYED_RELEASE_TABLET | Freq: Two times a day (BID) | ORAL | 3 refills | Status: DC

## 2018-06-19 MED ORDER — HYDROCODONE-ACETAMINOPHEN 7.5-325 MG PO TABS: 1.0000 | ORAL_TABLET | Freq: Four times a day (QID) | ORAL | 0 refills | Status: DC | PRN

## 2018-06-19 NOTE — Progress Notes (Signed)
Subjective:    Patient ID: Cassandra [name], female    DOB: 1956/02/24, 62 y.o.   MRN: [id number]  HPI: Cassandra [name] is a 62 year old female who returns for follow up appointment for chronic pain and medication refill. She states her pain is located in her neck mainly left side radiating into her left shoulder and mid- back. She rates her pain 7. Her current exercise regime is walking, stretching exercises and light weights. She rates her pain 7.   [name] Morphine Equivalent is is 7.50 MME. She is also prescribed Alprazolam, last fill date was on 03/19/2018, she reports she's using it sparingly. It is prescribe  by Dr. Livia Snellen. We have discussed the black box warning of using opioids and benzodiazepines. I highlighted the dangers of using these drugs together and discussed the adverse events including respiratory suppression, overdose, cognitive impairment and importance of compliance with current regimen. We will continue to monitor and adjust as indicated.   Last UDS was Performed on 12/25/2017, it was consistent. UDS performed today.    Pain Inventory Average Pain 7 Pain Right Now 7 My pain is constant, sharp, burning, dull, stabbing, tingling and aching  In the last 24 hours, has pain interfered with the following? General activity 6 Relation with others 6 Enjoyment of life 6 What TIME of day is your pain at its worst? morning and night Sleep (in general) Poor  Pain is worse with: bending, sitting, inactivity and some activites Pain improves with: heat/ice, pacing activities and medication Relief from Meds: 6  Mobility walk without assistance how many minutes can you walk? 16 ability to climb steps?  yes do you drive?  yes  Function disabled: date disabled 2007  Neuro/Psych bladder control problems bowel control problems weakness numbness tremor tingling spasms dizziness depression anxiety  Prior Studies Any changes since last visit?  no  Physicians  involved in your care Any changes since last visit?  no   Family History  Problem Relation Age of Onset  . Coronary artery disease Mother   . Diabetes Mother        borderline  . Kidney disease Mother        stage 4  . Coronary artery disease Father   . Prostate cancer Father   . Lung disease Father        black lung  . Liver cancer Maternal Grandmother   . Liver disease Maternal Grandmother   . Polymyalgia rheumatica Sister   . Heart disease Sister   . Coronary artery disease Brother        heart attack  . COPD Brother   . Colon polyps Sister   . Lung cancer Paternal Grandmother   . Colon cancer Unknown        Both sides   Social History   Socioeconomic History  . Marital status: Married    Spouse name: Not on file  . Number of children: 1  . Years of education: Not on file  . Highest education level: GED or equivalent  Occupational History  . Occupation: Agricultural engineer  Social Needs  . Financial resource strain: Not hard at all  . Food insecurity:    Worry: Never true    Inability: Never true  . Transportation needs:    Medical: No    Non-medical: No  Tobacco Use  . Smoking status: Former Smoker    Packs/day: 1.00    Years: 42.00    Pack years: 42.00  Types: Cigarettes    Last attempt to quit: 06/01/2015    Years since quitting: 3.0  . Smokeless tobacco: Never Used  . Tobacco comment: would like to try chantix, but has not started on it due to warnings related to depression  Substance and Sexual Activity  . Alcohol use: No    Alcohol/week: 0.0 standard drinks  . Drug use: No  . Sexual activity: Yes  Lifestyle  . Physical activity:    Days per week: 3 days    Minutes per session: 30 min  . Stress: Not at all  Relationships  . Social connections:    Talks on phone: More than three times a week    Gets together: More than three times a week    Attends religious service: 1 to 4 times per year    Active member of club or organization: No    Attends  meetings of clubs or organizations: Never    Relationship status: Married  Other Topics Concern  . Not on file  Social History Narrative  . Not on file   Past Surgical History:  Procedure Laterality Date  . ABDOMINAL HYSTERECTOMY     age 28  . ANAL RECTAL MANOMETRY N/A 12/09/2015   Procedure: ANO RECTAL MANOMETRY;  Surgeon: Mauri Pole, MD;  Location: [facility];  Service: Endoscopy;  Laterality: N/A;  . BLADDER REPAIR     sling/with perforation  . CESAREAN SECTION     1 time  . KNEE ARTHROSCOPY Left   . ruptured uterus    . SHOULDER ARTHROSCOPY     multiple   Past Medical History:  Diagnosis Date  . Anxiety   . Bowel habit changes    excessive gas /frequent BM's with change in shape with pus  . Chronic left shoulder pain    frozen   . Chronic pain    left upper shoulder ,neck and back  . COPD (chronic obstructive pulmonary disease) ([place])    Dr. Redge Gainer  . Depression   . GERD (gastroesophageal reflux disease)   . Hyperlipidemia   . Maternal complication related to childbirth    Sepsis, staph  . Meniere's disease   . Nicotine addiction    2 packs per day   . Rapid or irregular heartbeat   . Skin cancer    Rt. leg, squamous cell  . Tubular adenoma of colon 01/31/2000   Dr. Richmond Campbell  . Weakness of pelvic floor    prolapse -  MRI from 2017   BP 126/73   Pulse 66   Ht 5\' 6"  (1.676 m)   Wt 157 lb (71.2 kg)   SpO2 94%   BMI 25.34 kg/m   Opioid Risk Score:   Fall Risk Score:  `1  Depression screen PHQ 2/9  Depression screen [facility] 2/9 06/19/2018 02/27/2018 12/25/2017 12/20/2017 12/19/2017 08/22/2017 06/20/2017  Decreased Interest 1 1 0 2 2 0 1  Down, Depressed, Hopeless 1 1 0 0 0 0 1  PHQ - 2 Score 2 2 0 2 2 0 2  Altered sleeping - - 0 2 2 - 2  Tired, decreased energy - - 0 3 3 - 2  Change in appetite - - 0 2 2 - 2  Feeling bad or failure about yourself  - - 0 0 0 - 0  Trouble concentrating - - 0 1 1 - 1  Moving slowly or fidgety/restless - - 0 0  0 - 1  Suicidal thoughts - - 0  0 0 - 0  PHQ-9 Score - - 0 10 10 - 10  Some recent data might be hidden   Review of Systems  Constitutional: Positive for appetite change, diaphoresis and unexpected weight change.  HENT: Negative.   Eyes: Negative.   Respiratory: Negative.   Cardiovascular: Negative.   Gastrointestinal: Positive for constipation and diarrhea.  Endocrine: Negative.   Genitourinary: Positive for dysuria.  Musculoskeletal: Negative.   Skin: Positive for rash.  Allergic/Immunologic: Negative.   Neurological: Negative.   Hematological: Negative.   Psychiatric/Behavioral: Negative.   All other systems reviewed and are negative.      Objective:   Physical Exam  Constitutional: She is oriented to person, place, and time. She appears well-developed and well-nourished.  HENT:  Head: Normocephalic and atraumatic.  Neck: Normal range of motion. Neck supple.  Cervical Paraspinal Tenderness: Mainly Left Side: C-4-C-6  Cardiovascular: Normal rate and regular rhythm.  Pulmonary/Chest: Effort normal and breath sounds normal.  Musculoskeletal:  Normal Muscle Bulk and Muscle Testing Reveals:  Upper Extremities: Full ROM and Muscle Strength on the Right 5/5 and Left 3/5 Left AC Joint Tenderness Thoracic Hypersensitivity: T-1-T-7 Lower Extremities: Full ROM and Muscle Strength 5/5 Arises from Table with Ease Narrow Based gait  Neurological: She is alert and oriented to person, place, and time.  Skin: Skin is warm and dry.  Psychiatric: She has a normal mood and affect. Her behavior is normal.  Nursing note and vitals reviewed.         Assessment & Plan:  1.Post-traumatic arthritis left shoulder s/p 2 shoulder surgeries. Now with adhesive capsulitis left shoulder. 06/19/2018 Refilled: Hydrocodone 5/325mg  one tablet every 6 hours as needed. #30.  We will continue the opioid monitoring program, this consists of regular clinic visits, examinations, urine drug screen, pill  counts as well as use of [place] Controlled Substance Reporting System. 2. Cervicalgia,Cervical spondylosis: Continue Current Medication Regime.Voltaren,Robaxin and Gabapentin. 06/19/2017 3. Depression/insomnia/anxiety: Continue Lexapro. Continue Ambien 5 mg HS #30. Dr. Livia Snellen prescribing Xanax. 06/19/2017 4. Myofascial Pain: Continue Robaxin. 5. Chronic Pain of Knees: Continue Voltaren.   20 minutes of face to face patient care time was spent during this visit. All questions were encouraged and answered.   F/U in 2 months

## 2018-06-20 ENCOUNTER — Encounter: Payer: Self-pay | Admitting: Family Medicine

## 2018-06-20 ENCOUNTER — Ambulatory Visit (INDEPENDENT_AMBULATORY_CARE_PROVIDER_SITE_OTHER): Payer: Medicare Other | Admitting: Family Medicine

## 2018-06-20 VITALS — BP 145/69 | HR 57 | Temp 97.7°F | Ht 66.0 in | Wt 159.0 lb

## 2018-06-20 DIAGNOSIS — Z23 Encounter for immunization: Secondary | ICD-10-CM

## 2018-06-20 DIAGNOSIS — R5383 Other fatigue: Secondary | ICD-10-CM | POA: Diagnosis not present

## 2018-06-20 DIAGNOSIS — E559 Vitamin D deficiency, unspecified: Secondary | ICD-10-CM

## 2018-06-20 DIAGNOSIS — F411 Generalized anxiety disorder: Secondary | ICD-10-CM | POA: Diagnosis not present

## 2018-06-20 DIAGNOSIS — N3941 Urge incontinence: Secondary | ICD-10-CM

## 2018-06-20 DIAGNOSIS — Z0289 Encounter for other administrative examinations: Secondary | ICD-10-CM | POA: Diagnosis not present

## 2018-06-20 DIAGNOSIS — Z79891 Long term (current) use of opiate analgesic: Secondary | ICD-10-CM | POA: Diagnosis not present

## 2018-06-20 DIAGNOSIS — E782 Mixed hyperlipidemia: Secondary | ICD-10-CM | POA: Diagnosis not present

## 2018-06-20 DIAGNOSIS — M7918 Myalgia, other site: Secondary | ICD-10-CM

## 2018-06-20 MED ORDER — CIPROFLOXACIN HCL 250 MG PO TABS: 250.0000 mg | ORAL_TABLET | Freq: Every day | ORAL | 5 refills | Status: DC

## 2018-06-20 MED ORDER — ALPRAZOLAM 1 MG PO TABS: ORAL_TABLET | ORAL | 5 refills | Status: DC

## 2018-06-20 MED ORDER — ESCITALOPRAM OXALATE 20 MG PO TABS: 20.0000 mg | ORAL_TABLET | Freq: Every day | ORAL | 0 refills | Status: DC

## 2018-06-20 NOTE — Progress Notes (Signed)
Subjective:  Patient ID: Cassandra [name], female    DOB: 17-Jul-1956  Age: 62 y.o. MRN: [id number]  CC: Medical Management of Chronic Issues (six month recheck) and contract for xanax refills   HPI Cassandra [name] presents for follow-up of elevated cholesterol. Doing well without complaints on current medication. Denies side effects of statin including myalgia and arthralgia and nausea. Also in today for liver function testing. Currently no chest pain, shortness of breath or other cardiovascular related symptoms noted.  Patient has been a longtime user of alprazolam for which she takes one half in the morning and 1 in the evening.  She has chronic anxiety that is adequately relieved with the medication. History Cassandra [name] has a past medical history of Anxiety, Bowel habit changes, Chronic left shoulder pain, Chronic pain, COPD (chronic obstructive pulmonary disease) ([place]), Depression, GERD (gastroesophageal reflux disease), Hyperlipidemia, Maternal complication related to childbirth, Meniere's disease, Nicotine addiction, Rapid or irregular heartbeat, Skin cancer, Tubular adenoma of colon (01/31/2000), and Weakness of pelvic floor.   She has a past surgical history that includes Bladder repair; Shoulder arthroscopy; Abdominal hysterectomy; Knee arthroscopy (Left); Cesarean section; ruptured uterus; and Anal Rectal manometry (N/A, 12/09/2015).   Her family history includes COPD in her brother; Colon cancer in her unknown relative; Colon polyps in her sister; Coronary artery disease in her brother, father, and mother; Diabetes in her mother; Heart disease in her sister; Kidney disease in her mother; Liver cancer in her maternal grandmother; Liver disease in her maternal grandmother; Lung cancer in her paternal grandmother; Lung disease in her father; Polymyalgia rheumatica in her sister; Prostate cancer in her father.She reports that she quit smoking about 3 years ago. Her smoking use included cigarettes. She  has a 42.00 pack-year smoking history. She has never used smokeless tobacco. She reports that she does not drink alcohol or use drugs.  Current Outpatient Medications on File Prior to Visit  Medication Sig Dispense Refill  . chlorhexidine (HIBICLENS) 4 % external liquid Shower daily for 5 days. Apply all over, including scalp and leave on  5 min before rinsing. 236 mL 1  . cholecalciferol (VITAMIN D) 1000 units tablet Take 1,000 Units by mouth daily.    Marland Kitchen conjugated estrogens (PREMARIN) vaginal cream Place vaginally daily. 42.5 g 5  . diclofenac (VOLTAREN) 75 MG EC tablet Take 1 tablet (75 mg total) by mouth 2 (two) times daily with a meal. 60 tablet 3  . fexofenadine (ALLEGRA) 180 MG tablet Take 1 tablet (180 mg total) by mouth daily as needed for allergies. 90 tablet 3  . gabapentin (NEURONTIN) 300 MG capsule Take 1 capsule (300 mg total) by mouth 3 (three) times daily. 90 capsule 3  . HYDROcodone-acetaminophen (NORCO) 7.5-325 MG tablet Take 1 tablet by mouth every 6 (six) hours as needed for moderate pain. 30 tablet 0  . hydroxypropyl methylcellulose / hypromellose (ISOPTO TEARS / GONIOVISC) 2.5 % ophthalmic solution Place 1 drop into both eyes as needed for dry eyes.    . methocarbamol (ROBAXIN) 500 MG tablet Take 1 tablet (500 mg total) by mouth 2 (two) times daily. 60 tablet 5  . mupirocin ointment (BACTROBAN) 2 % Place 1 application into the nose 2 (two) times daily. 22 g 0  . meclizine (ANTIVERT) 25 MG tablet TAKE 1 TABLET BY MOUTH TWO  TIMES DAILY AS NEEDED (Patient not taking: Reported on 06/20/2018) 60 tablet 2  . ondansetron (ZOFRAN-ODT) 8 MG disintegrating tablet Take 1 tablet (8 mg total) by mouth every  6 (six) hours as needed for nausea or vomiting. (Patient not taking: Reported on 06/20/2018) 30 tablet 6   No current facility-administered medications on file prior to visit.     ROS Review of Systems  Constitutional: Negative.   HENT: Negative for congestion.   Eyes: Negative for  visual disturbance.  Respiratory: Negative for shortness of breath.   Cardiovascular: Negative for chest pain.  Gastrointestinal: Negative for abdominal pain, constipation, diarrhea, nausea and vomiting.  Genitourinary: Negative for difficulty urinating.  Musculoskeletal: Negative for arthralgias and myalgias.  Neurological: Negative for headaches.  Psychiatric/Behavioral: Positive for sleep disturbance. Negative for agitation. The patient is nervous/anxious.     Objective:  BP (!) 145/69   Pulse (!) 57   Temp 97.7 F (36.5 C) (Oral)   Ht _0  (1.676 m)   Wt 159 lb (72.1 kg)   BMI 25.66 kg/m   BP Readings from Last 3 Encounters:  06/20/18 (!) 145/69  06/19/18 126/73  04/16/18 (!) 142/77    Wt Readings from Last 3 Encounters:  06/20/18 159 lb (72.1 kg)  06/19/18 157 lb (71.2 kg)  04/16/18 159 lb (72.1 kg)     Physical Exam  Constitutional: She is oriented to person, place, and time. She appears well-developed and well-nourished. No distress.  HENT:  Head: Normocephalic and atraumatic.  Right Ear: External ear normal.  Left Ear: External ear normal.  Nose: Nose normal.  Mouth/Throat: Oropharynx is clear and moist.  Eyes: Pupils are equal, round, and reactive to light. Conjunctivae and EOM are normal.  Neck: Normal range of motion. Neck supple. No thyromegaly present.  Cardiovascular: Normal rate, regular rhythm and normal heart sounds.  No murmur heard. Pulmonary/Chest: Effort normal and breath sounds normal. No respiratory distress. She has no wheezes. She has no rales.  Abdominal: Soft. Bowel sounds are normal. She exhibits no distension. There is no tenderness.  Lymphadenopathy:    She has no cervical adenopathy.  Neurological: She is alert and oriented to person, place, and time. She has normal reflexes.  Skin: Skin is warm and dry.  Psychiatric: She has a normal mood and affect. Her behavior is normal. Judgment and thought content normal.      Lab Results    Component Value Date   WBC 5.4 12/19/2017   HGB 14.1 12/19/2017   HCT 43.4 12/19/2017   PLT 345 12/19/2017   GLUCOSE 97 12/19/2017   CHOL 283 (H) 06/20/2017   TRIG 96 06/20/2017   HDL 64 06/20/2017   LDLCALC 200 (H) 06/20/2017   ALT 14 12/19/2017   AST 19 12/19/2017   NA 140 12/19/2017   K 4.4 12/19/2017   CL 101 12/19/2017   CREATININE 0.96 12/19/2017   BUN 17 12/19/2017   CO2 22 12/19/2017   TSH 0.786 12/19/2017    Dg Knee 1-2 Views Left  Result Date: 05/16/2017 CLINICAL DATA:  Chronic pain EXAM: LEFT KNEE - 1-2 VIEW COMPARISON:  None. FINDINGS: Standing frontal and standing lateral views were obtained. There is no evident fracture. There is slight lateral patellar subluxation without dislocation. There is a small joint effusion. There is no appreciable joint space narrowing. No erosive change. IMPRESSION: Slight lateral patellar subluxation without dislocation. Small joint effusion. No fracture. No appreciable joint space narrowing or erosion. Electronically Signed   By: Lowella Grip III M.D.   On: 05/16/2017 13:37    Assessment & Plan:   Angeleen was seen today for medical management of chronic issues and contract for xanax refills.  Diagnoses and all orders for this visit:  Mixed hyperlipidemia -     Lipid panel  Urgency incontinence -     CMP14+EGFR  GAD (generalized anxiety disorder)  Vitamin D deficiency -     VITAMIN D 25 Hydroxy (Vit-D Deficiency, Fractures)  Pain management contract agreement -     ToxASSURE Select 13 (MW), Urine  Fatigue, unspecified type -     CBC with Differential/Platelet  Need for immunization against influenza -     Flu Vaccine QUAD 36+ mos IM  Myofascial pain  Other orders -     ciprofloxacin (CIPRO) 250 MG tablet; Take 1 tablet (250 mg total) by mouth daily with breakfast. -     ALPRAZolam (XANAX) 1 MG tablet; Take one-half  tablet every morning and 1 tablet every evening for Meniere's Disease -     escitalopram  (LEXAPRO) 20 MG tablet; Take 1 tablet (20 mg total) by mouth at bedtime.   I have discontinued Nance Mccombs. Cassandra [name] nitrofurantoin (macrocrystal-monohydrate), zolpidem, and ciprofloxacin. I have also changed her ciprofloxacin. Additionally, I am having her maintain her conjugated estrogens, hydroxypropyl methylcellulose / hypromellose, chlorhexidine, mupirocin ointment, cholecalciferol, ondansetron, fexofenadine, methocarbamol, gabapentin, meclizine, diclofenac, HYDROcodone-acetaminophen, ALPRAZolam, and escitalopram.  Meds ordered this encounter  Medications  . ciprofloxacin (CIPRO) 250 MG tablet    Sig: Take 1 tablet (250 mg total) by mouth daily with breakfast.    Dispense:  30 tablet    Refill:  5  . ALPRAZolam (XANAX) 1 MG tablet    Sig: Take one-half  tablet every morning and 1 tablet every evening for Meniere's Disease    Dispense:  45 tablet    Refill:  5  . escitalopram (LEXAPRO) 20 MG tablet    Sig: Take 1 tablet (20 mg total) by mouth at bedtime.    Dispense:  90 tablet    Refill:  0     Follow-up: No follow-ups on file.  [name], M.D.

## 2018-06-21 LAB — CMP14+EGFR
ALT: 9 IU/L (ref 0–32)
AST: 16 IU/L (ref 0–40)
Albumin/Globulin Ratio: 1.6 (ref 1.2–2.2)
Albumin: 4.3 g/dL (ref 3.6–4.8)
Alkaline Phosphatase: 107 IU/L (ref 39–117)
BILIRUBIN TOTAL: 0.2 mg/dL (ref 0.0–1.2)
BUN/Creatinine Ratio: 17 (ref 12–28)
BUN: 18 mg/dL (ref 8–27)
CHLORIDE: 102 mmol/L (ref 96–106)
CO2: 26 mmol/L (ref 20–29)
Calcium: 9.5 mg/dL (ref 8.7–10.3)
Creatinine, Ser: 1.03 mg/dL — ABNORMAL HIGH (ref 0.57–1.00)
GFR calc Af Amer: 67 mL/min/{1.73_m2} (ref >59)
GFR calc non Af Amer: 58 mL/min/{1.73_m2} — ABNORMAL LOW (ref >59)
Globulin, Total: 2.7 g/dL (ref 1.5–4.5)
Glucose: 90 mg/dL (ref 65–99)
POTASSIUM: 4.4 mmol/L (ref 3.5–5.2)
Sodium: 143 mmol/L (ref 134–144)
Total Protein: 7 g/dL (ref 6.0–8.5)

## 2018-06-21 LAB — CBC WITH DIFFERENTIAL/PLATELET
BASOS: 1 %
Basophils Absolute: 0 10*3/uL (ref 0.0–0.2)
EOS (ABSOLUTE): 0.1 10*3/uL (ref 0.0–0.4)
Eos: 3 %
HEMATOCRIT: 40.5 % (ref 34.0–46.6)
HEMOGLOBIN: 13.8 g/dL (ref 11.1–15.9)
Immature Grans (Abs): 0 10*3/uL (ref 0.0–0.1)
Immature Granulocytes: 0 %
LYMPHS ABS: 1.9 10*3/uL (ref 0.7–3.1)
LYMPHS: 35 %
MCH: 29.6 pg (ref 26.6–33.0)
MCHC: 34.1 g/dL (ref 31.5–35.7)
MCV: 87 fL (ref 79–97)
MONOS ABS: 0.5 10*3/uL (ref 0.1–0.9)
Monocytes: 10 %
NEUTROS ABS: 2.8 10*3/uL (ref 1.4–7.0)
Neutrophils: 51 %
Platelets: 350 10*3/uL (ref 150–450)
RBC: 4.66 x10E6/uL (ref 3.77–5.28)
RDW: 13.4 % (ref 12.3–15.4)
WBC: 5.4 10*3/uL (ref 3.4–10.8)

## 2018-06-21 LAB — VITAMIN D 25 HYDROXY (VIT D DEFICIENCY, FRACTURES): Vit D, 25-Hydroxy: 37.1 ng/mL (ref 30.0–100.0)

## 2018-06-21 LAB — LIPID PANEL
Chol/HDL Ratio: 4.6 ratio — ABNORMAL HIGH (ref 0.0–4.4)
Cholesterol, Total: 244 mg/dL — ABNORMAL HIGH (ref 100–199)
HDL: 53 mg/dL (ref >39)
LDL Calculated: 174 mg/dL — ABNORMAL HIGH (ref 0–99)
Triglycerides: 86 mg/dL (ref 0–149)
VLDL Cholesterol Cal: 17 mg/dL (ref 5–40)

## 2018-06-22 ENCOUNTER — Other Ambulatory Visit: Payer: Self-pay | Admitting: Family Medicine

## 2018-06-23 LAB — TOXASSURE SELECT 13 (MW), URINE

## 2018-06-24 ENCOUNTER — Other Ambulatory Visit: Payer: Self-pay | Admitting: Physical Medicine & Rehabilitation

## 2018-06-24 DIAGNOSIS — M47812 Spondylosis without myelopathy or radiculopathy, cervical region: Secondary | ICD-10-CM

## 2018-06-24 LAB — TOXASSURE SELECT,+ANTIDEPR,UR

## 2018-06-25 ENCOUNTER — Telehealth: Payer: Self-pay | Admitting: *Deleted

## 2018-06-25 NOTE — Telephone Encounter (Signed)
Urine drug screen for this encounter is consistent for prescribed medication 

## 2018-06-26 ENCOUNTER — Encounter: Payer: Self-pay | Admitting: *Deleted

## 2018-06-27 ENCOUNTER — Encounter: Payer: Self-pay | Admitting: *Deleted

## 2018-08-21 ENCOUNTER — Encounter: Payer: Medicare Other | Attending: Physical Medicine & Rehabilitation | Admitting: Registered Nurse

## 2018-08-21 ENCOUNTER — Encounter: Payer: Self-pay | Admitting: Registered Nurse

## 2018-08-21 VITALS — BP 129/72 | HR 104 | Ht 66.0 in | Wt 160.4 lb

## 2018-08-21 DIAGNOSIS — Z8249 Family history of ischemic heart disease and other diseases of the circulatory system: Secondary | ICD-10-CM | POA: Insufficient documentation

## 2018-08-21 DIAGNOSIS — Z8371 Family history of colonic polyps: Secondary | ICD-10-CM | POA: Diagnosis not present

## 2018-08-21 DIAGNOSIS — Z8042 Family history of malignant neoplasm of prostate: Secondary | ICD-10-CM | POA: Diagnosis not present

## 2018-08-21 DIAGNOSIS — M19012 Primary osteoarthritis, left shoulder: Secondary | ICD-10-CM | POA: Diagnosis not present

## 2018-08-21 DIAGNOSIS — Z5181 Encounter for therapeutic drug level monitoring: Secondary | ICD-10-CM

## 2018-08-21 DIAGNOSIS — F329 Major depressive disorder, single episode, unspecified: Secondary | ICD-10-CM | POA: Diagnosis not present

## 2018-08-21 DIAGNOSIS — Z841 Family history of disorders of kidney and ureter: Secondary | ICD-10-CM | POA: Diagnosis not present

## 2018-08-21 DIAGNOSIS — M25561 Pain in right knee: Secondary | ICD-10-CM

## 2018-08-21 DIAGNOSIS — Z90710 Acquired absence of both cervix and uterus: Secondary | ICD-10-CM | POA: Diagnosis not present

## 2018-08-21 DIAGNOSIS — K219 Gastro-esophageal reflux disease without esophagitis: Secondary | ICD-10-CM | POA: Insufficient documentation

## 2018-08-21 DIAGNOSIS — Z833 Family history of diabetes mellitus: Secondary | ICD-10-CM | POA: Insufficient documentation

## 2018-08-21 DIAGNOSIS — G8929 Other chronic pain: Secondary | ICD-10-CM | POA: Diagnosis not present

## 2018-08-21 DIAGNOSIS — J449 Chronic obstructive pulmonary disease, unspecified: Secondary | ICD-10-CM | POA: Diagnosis not present

## 2018-08-21 DIAGNOSIS — Z79891 Long term (current) use of opiate analgesic: Secondary | ICD-10-CM

## 2018-08-21 DIAGNOSIS — G894 Chronic pain syndrome: Secondary | ICD-10-CM

## 2018-08-21 DIAGNOSIS — E785 Hyperlipidemia, unspecified: Secondary | ICD-10-CM | POA: Insufficient documentation

## 2018-08-21 DIAGNOSIS — M5412 Radiculopathy, cervical region: Secondary | ICD-10-CM | POA: Diagnosis not present

## 2018-08-21 DIAGNOSIS — M7918 Myalgia, other site: Secondary | ICD-10-CM | POA: Diagnosis not present

## 2018-08-21 DIAGNOSIS — M7502 Adhesive capsulitis of left shoulder: Secondary | ICD-10-CM | POA: Diagnosis not present

## 2018-08-21 DIAGNOSIS — G4700 Insomnia, unspecified: Secondary | ICD-10-CM | POA: Diagnosis not present

## 2018-08-21 DIAGNOSIS — M25562 Pain in left knee: Secondary | ICD-10-CM | POA: Diagnosis not present

## 2018-08-21 DIAGNOSIS — Z87891 Personal history of nicotine dependence: Secondary | ICD-10-CM | POA: Insufficient documentation

## 2018-08-21 DIAGNOSIS — M7631 Iliotibial band syndrome, right leg: Secondary | ICD-10-CM | POA: Insufficient documentation

## 2018-08-21 DIAGNOSIS — M47812 Spondylosis without myelopathy or radiculopathy, cervical region: Secondary | ICD-10-CM | POA: Diagnosis not present

## 2018-08-21 DIAGNOSIS — Z801 Family history of malignant neoplasm of trachea, bronchus and lung: Secondary | ICD-10-CM | POA: Insufficient documentation

## 2018-08-21 DIAGNOSIS — F419 Anxiety disorder, unspecified: Secondary | ICD-10-CM | POA: Insufficient documentation

## 2018-08-21 DIAGNOSIS — M546 Pain in thoracic spine: Secondary | ICD-10-CM

## 2018-08-21 DIAGNOSIS — M542 Cervicalgia: Secondary | ICD-10-CM

## 2018-08-21 DIAGNOSIS — Z85828 Personal history of other malignant neoplasm of skin: Secondary | ICD-10-CM | POA: Diagnosis not present

## 2018-08-21 DIAGNOSIS — Z825 Family history of asthma and other chronic lower respiratory diseases: Secondary | ICD-10-CM | POA: Insufficient documentation

## 2018-08-21 MED ORDER — HYDROCODONE-ACETAMINOPHEN 7.5-325 MG PO TABS: 1.0000 | ORAL_TABLET | Freq: Every day | ORAL | 0 refills | Status: DC | PRN

## 2018-08-21 NOTE — Progress Notes (Signed)
Subjective:    Patient ID: Cassandra [name], female    DOB: 1956-01-29, 62 y.o.   MRN: [id number]  HPI: Cassandra [name] is a 62 y.o. female who returns for follow up appointment for chronic pain and medication refill. She states her pain is located in her neck radiating into her left shoulder, left arm with tingling and burning and mid-back pain. She rates her pain  7. Her  current exercise regime is walking, performing stretching exercises and lifting light weights.   Ms. Steedman Morphine equivalent is 7.5 MME. She is also prescribed Alprazolam by Dr. Livia Snellen .We have discussed the black box warning of using opioids and benzodiazepines. I highlighted the dangers of using these drugs together and discussed the adverse events including respiratory suppression, overdose, cognitive impairment and importance of compliance with current regimen. We will continue to monitor and adjust as indicated.   Pain Inventory Average Pain 7 Pain Right Now 7 My pain is constant, sharp, burning, dull, stabbing, tingling and aching  In the last 24 hours, has pain interfered with the following? General activity 7 Relation with others 7 Enjoyment of life 8 What TIME of day is your pain at its worst? morning and night Sleep (in general) Poor  Pain is worse with: bending, sitting and some activites Pain improves with: heat/ice, pacing activities and medication Relief from Meds: 4  Mobility walk without assistance ability to climb steps?  yes do you drive?  yes  Function disabled: date disabled 2007 I need assistance with the following:  dressing, meal prep, household duties and shopping  Neuro/Psych bladder control problems bowel control problems weakness numbness tremor trouble walking spasms dizziness  Prior Studies Any changes since last visit?  no bone scan x-rays CT/MRI  Physicians involved in your care Any changes since last visit?  no   Family History  Problem Relation Age of Onset  .  Coronary artery disease Mother   . Diabetes Mother        borderline  . Kidney disease Mother        stage 4  . Coronary artery disease Father   . Prostate cancer Father   . Lung disease Father        black lung  . Liver cancer Maternal Grandmother   . Liver disease Maternal Grandmother   . Polymyalgia rheumatica Sister   . Heart disease Sister   . Coronary artery disease Brother        heart attack  . COPD Brother   . Colon polyps Sister   . Lung cancer Paternal Grandmother   . Colon cancer Unknown        Both sides   Social History   Socioeconomic History  . Marital status: Married    Spouse name: Not on file  . Number of children: 1  . Years of education: Not on file  . Highest education level: GED or equivalent  Occupational History  . Occupation: Agricultural engineer  Social Needs  . Financial resource strain: Not hard at all  . Food insecurity:    Worry: Never true    Inability: Never true  . Transportation needs:    Medical: No    Non-medical: No  Tobacco Use  . Smoking status: Former Smoker    Packs/day: 1.00    Years: 42.00    Pack years: 42.00    Types: Cigarettes    Last attempt to quit: 06/01/2015    Years since quitting: 3.2  . Smokeless tobacco: Never  Used  . Tobacco comment: would like to try chantix, but has not started on it due to warnings related to depression  Substance and Sexual Activity  . Alcohol use: No    Alcohol/week: 0.0 standard drinks  . Drug use: No  . Sexual activity: Yes  Lifestyle  . Physical activity:    Days per week: 3 days    Minutes per session: 30 min  . Stress: Not at all  Relationships  . Social connections:    Talks on phone: More than three times a week    Gets together: More than three times a week    Attends religious service: 1 to 4 times per year    Active member of club or organization: No    Attends meetings of clubs or organizations: Never    Relationship status: Married  Other Topics Concern  . Not on file    Social History Narrative  . Not on file   Past Surgical History:  Procedure Laterality Date  . ABDOMINAL HYSTERECTOMY     age 47  . ANAL RECTAL MANOMETRY N/A 12/09/2015   Procedure: ANO RECTAL MANOMETRY;  Surgeon: Mauri Pole, MD;  Location: [facility];  Service: Endoscopy;  Laterality: N/A;  . BLADDER REPAIR     sling/with perforation  . CESAREAN SECTION     1 time  . KNEE ARTHROSCOPY Left   . ruptured uterus    . SHOULDER ARTHROSCOPY     multiple   Past Medical History:  Diagnosis Date  . Anxiety   . Bowel habit changes    excessive gas /frequent BM's with change in shape with pus  . Chronic left shoulder pain    frozen   . Chronic pain    left upper shoulder ,neck and back  . COPD (chronic obstructive pulmonary disease) ([place])    Dr. Redge Gainer  . Depression   . GERD (gastroesophageal reflux disease)   . Hyperlipidemia   . Maternal complication related to childbirth    Sepsis, staph  . Meniere's disease   . Nicotine addiction    2 packs per day   . Rapid or irregular heartbeat   . Skin cancer    Rt. leg, squamous cell  . Tubular adenoma of colon 01/31/2000   Dr. Richmond Campbell  . Weakness of pelvic floor    prolapse -  MRI from 2017   BP 129/72   Pulse (!) 104   Ht 5\' 6"  (1.676 m)   Wt 160 lb 6.4 oz (72.8 kg)   SpO2 97%   BMI 25.89 kg/m   Opioid Risk Score:   Fall Risk Score:  `1  Depression screen PHQ 2/9  Depression screen [facility] 2/9 06/20/2018 06/19/2018 02/27/2018 12/25/2017 12/20/2017 12/19/2017 08/22/2017  Decreased Interest 0 1 1 0 2 2 0  Down, Depressed, Hopeless 0 1 1 0 0 0 0  PHQ - 2 Score 0 2 2 0 2 2 0  Altered sleeping - - - 0 2 2 -  Tired, decreased energy - - - 0 3 3 -  Change in appetite - - - 0 2 2 -  Feeling bad or failure about yourself  - - - 0 0 0 -  Trouble concentrating - - - 0 1 1 -  Moving slowly or fidgety/restless - - - 0 0 0 -  Suicidal thoughts - - - 0 0 0 -  PHQ-9 Score - - - 0 10 10 -  Some recent data might  be  hidden     Review of Systems  Constitutional: Positive for appetite change, chills, diaphoresis, fever and unexpected weight change.  HENT: Negative.   Eyes: Negative.   Respiratory: Negative.   Cardiovascular: Negative.   Gastrointestinal: Positive for abdominal pain, constipation, diarrhea, nausea and vomiting.  Endocrine: Negative.   Genitourinary: Negative.   Musculoskeletal: Negative.   Skin: Negative.   Allergic/Immunologic: Negative.   Neurological: Negative.   Hematological: Negative.   Psychiatric/Behavioral: Negative.   All other systems reviewed and are negative.      Objective:   Physical Exam  Constitutional: She is oriented to person, place, and time. She appears well-developed and well-nourished.  HENT:  Head: Normocephalic and atraumatic.  Neck: Normal range of motion. Neck supple.  Cervical Paraspinal Tenderness: C-5-C-6 Mainly Left Side  Cardiovascular: Normal rate and regular rhythm.  Pulmonary/Chest: Effort normal and breath sounds normal.  Musculoskeletal:  Normal Muscle Bulk and Muscle Testing Reveals:  Upper Extremities:  Right: Full ROM and Muscle Strength  5/5 Left: Decreased ROM 30 Degrees and Muscle Strength 4/4 Left AC Joint Tenderness Thoracic Hypersensitivity: T-1-T-7  Lower Extremities: Full ROM and Muscle Strength 5/5 Arises from chair with ease Narrow Based Gait   Neurological: She is alert and oriented to person, place, and time.  Skin: Skin is warm and dry.  Psychiatric: She has a normal mood and affect. Her behavior is normal.  Nursing note and vitals reviewed.         Assessment & Plan:  1.Post-traumatic arthritis left shoulder s/p 2 shoulder surgeries. Now with adhesive capsulitis left shoulder. 08/21/2018 Refilled: Hydrocodone 7.5/325mg  one tablet daily as needed. #30.  We will continue the opioid monitoring program, this consists of regular clinic visits, examinations, urine drug screen, pill counts as well as use of [place] Controlled Substance Reporting System. 2. Cervicalgia,Cervical spondylosis/ Cervical Radiculitis: Continue Current Medication Regime.Voltaren,Robaxin and Gabapentin. 08/21/2018 3. Depression/insomnia/anxiety: Continue Lexapro.. Dr. Livia Snellen prescribing Xanax. 08/21/2018 4. Myofascial Pain: Continue Robaxin.08/21/2018. 5. Chronic Pain of Knees: Continue Voltaren. 08/21/2018 6. Chronic Bilateral Thoracic Back Pain: Continue HEP as Tolerated. Continue to Alternate Ice and Heat Therapy. Continue current medication regimen.   76minutes of face to face patient care time was spent during this visit. All questions were encouraged and answered.   F/U in 2 months

## 2018-09-26 ENCOUNTER — Ambulatory Visit: Payer: Medicare Other | Admitting: Family Medicine

## 2018-10-04 ENCOUNTER — Encounter: Payer: Self-pay | Admitting: Family Medicine

## 2018-10-04 ENCOUNTER — Ambulatory Visit (INDEPENDENT_AMBULATORY_CARE_PROVIDER_SITE_OTHER): Payer: Medicare Other | Admitting: Family Medicine

## 2018-10-04 VITALS — BP 135/69 | HR 87 | Temp 96.7°F | Ht 66.0 in | Wt 156.1 lb

## 2018-10-04 DIAGNOSIS — L820 Inflamed seborrheic keratosis: Secondary | ICD-10-CM | POA: Diagnosis not present

## 2018-10-12 ENCOUNTER — Encounter: Payer: Self-pay | Admitting: Family Medicine

## 2018-10-12 NOTE — Progress Notes (Signed)
Chief Complaint  Patient presents with  . Nevus    pt here today c/o what she thinks is a mole in between her breast that she would like looked at    HPI  Patient presents today for lesion at cleavage that is irritated and bleeds at tiimes. Would ike it removed.  PMH: Smoking status noted ROS: Per HPI  Objective: BP 135/69   Pulse 87   Temp (!) 96.7 F (35.9 C) (Oral)   Ht 5\' 6"  (1.676 m)   Wt 156 lb 2 oz (70.8 kg)   BMI 25.20 kg/m  Gen: NAD, alert, cooperative with exam HEENT: NCAT, EOMI, PERRL Skin: 1 cm inflamed, red seb. Keratosis. Treated for 40 seconds with liquid nitrogen Ext: No edema, warm Neuro: Alert and oriented, No gross deficits  Assessment and plan:  1. Seborrheic keratoses, inflamed    Wound care reviewed. S&S infection reviewed     Follow up as needed.  [name], MD

## 2018-10-17 ENCOUNTER — Encounter: Payer: Medicare Other | Attending: Physical Medicine & Rehabilitation | Admitting: Physical Medicine & Rehabilitation

## 2018-10-17 ENCOUNTER — Encounter: Payer: Self-pay | Admitting: Physical Medicine & Rehabilitation

## 2018-10-17 ENCOUNTER — Other Ambulatory Visit: Payer: Self-pay

## 2018-10-17 VITALS — BP 115/79 | HR 88 | Ht 66.0 in | Wt 154.0 lb

## 2018-10-17 DIAGNOSIS — M19012 Primary osteoarthritis, left shoulder: Secondary | ICD-10-CM | POA: Diagnosis not present

## 2018-10-17 DIAGNOSIS — K219 Gastro-esophageal reflux disease without esophagitis: Secondary | ICD-10-CM | POA: Diagnosis not present

## 2018-10-17 DIAGNOSIS — Z8371 Family history of colonic polyps: Secondary | ICD-10-CM | POA: Diagnosis not present

## 2018-10-17 DIAGNOSIS — F419 Anxiety disorder, unspecified: Secondary | ICD-10-CM | POA: Diagnosis not present

## 2018-10-17 DIAGNOSIS — Z825 Family history of asthma and other chronic lower respiratory diseases: Secondary | ICD-10-CM | POA: Diagnosis not present

## 2018-10-17 DIAGNOSIS — Z841 Family history of disorders of kidney and ureter: Secondary | ICD-10-CM | POA: Insufficient documentation

## 2018-10-17 DIAGNOSIS — M25562 Pain in left knee: Secondary | ICD-10-CM | POA: Diagnosis not present

## 2018-10-17 DIAGNOSIS — M47812 Spondylosis without myelopathy or radiculopathy, cervical region: Secondary | ICD-10-CM | POA: Insufficient documentation

## 2018-10-17 DIAGNOSIS — M7631 Iliotibial band syndrome, right leg: Secondary | ICD-10-CM | POA: Diagnosis not present

## 2018-10-17 DIAGNOSIS — J449 Chronic obstructive pulmonary disease, unspecified: Secondary | ICD-10-CM | POA: Diagnosis not present

## 2018-10-17 DIAGNOSIS — K581 Irritable bowel syndrome with constipation: Secondary | ICD-10-CM

## 2018-10-17 DIAGNOSIS — M25561 Pain in right knee: Secondary | ICD-10-CM | POA: Diagnosis not present

## 2018-10-17 DIAGNOSIS — E785 Hyperlipidemia, unspecified: Secondary | ICD-10-CM | POA: Insufficient documentation

## 2018-10-17 DIAGNOSIS — M7502 Adhesive capsulitis of left shoulder: Secondary | ICD-10-CM | POA: Diagnosis not present

## 2018-10-17 DIAGNOSIS — F329 Major depressive disorder, single episode, unspecified: Secondary | ICD-10-CM | POA: Diagnosis not present

## 2018-10-17 DIAGNOSIS — G4700 Insomnia, unspecified: Secondary | ICD-10-CM | POA: Diagnosis not present

## 2018-10-17 DIAGNOSIS — Z85828 Personal history of other malignant neoplasm of skin: Secondary | ICD-10-CM | POA: Insufficient documentation

## 2018-10-17 DIAGNOSIS — Z8249 Family history of ischemic heart disease and other diseases of the circulatory system: Secondary | ICD-10-CM | POA: Insufficient documentation

## 2018-10-17 DIAGNOSIS — G8929 Other chronic pain: Secondary | ICD-10-CM | POA: Insufficient documentation

## 2018-10-17 DIAGNOSIS — Z801 Family history of malignant neoplasm of trachea, bronchus and lung: Secondary | ICD-10-CM | POA: Insufficient documentation

## 2018-10-17 DIAGNOSIS — Z833 Family history of diabetes mellitus: Secondary | ICD-10-CM | POA: Insufficient documentation

## 2018-10-17 DIAGNOSIS — Z8042 Family history of malignant neoplasm of prostate: Secondary | ICD-10-CM | POA: Insufficient documentation

## 2018-10-17 DIAGNOSIS — Z90710 Acquired absence of both cervix and uterus: Secondary | ICD-10-CM | POA: Insufficient documentation

## 2018-10-17 DIAGNOSIS — Z87891 Personal history of nicotine dependence: Secondary | ICD-10-CM | POA: Insufficient documentation

## 2018-10-17 MED ORDER — HYDROCODONE-ACETAMINOPHEN 7.5-325 MG PO TABS: 1.0000 | ORAL_TABLET | Freq: Every day | ORAL | 0 refills | Status: DC | PRN

## 2018-10-17 MED ORDER — MIRTAZAPINE 15 MG PO TABS: 15.0000 mg | ORAL_TABLET | Freq: Every day | ORAL | 2 refills | Status: DC

## 2018-10-17 NOTE — Patient Instructions (Addendum)
1. MAINTAIN BETTER EATING HABITS  2. LACTOSE FREE SUPPLEMENTS WITH PROTEIN 3. GET OUTSIDE AND WALK EACH DAY, GET SOME SUNLIGHT 4. INCREASED FLUIDS AND MULTI-VITAMIN WITH IRON 5. PRO-BIOTICS---LACTOSE FREE!! 6. UROLOGY FOLLOW UP

## 2018-10-17 NOTE — Progress Notes (Addendum)
Subjective:    Patient ID: Cassandra [name], female    DOB: [birth year], 63 y.o.   MRN: [id number]  HPI   Daleigh is here in follow up of her chronic pain. Her appetite has been more a problem over the last several weeks. In the last two weeks she has begun to feel "shaky".  She is not moving her bowels regularly. She is not drinking as much either.   She is still using hydrocodone for severe pain.  She try to use only 1/day on average.  She uses heat and TENS unit as well.  Her mood has improved since she divorced her husband.  She cut her Lexapro back to 10 mg daily and hardly using any of her anxiety medication.      Pain Inventory Average Pain 6 Pain Right Now 7 My pain is nine  In the last 24 hours, has pain interfered with the following? General activity 7 Relation with others 7 Enjoyment of life 7 What TIME of day is your pain at its worst? morning night Sleep (in general) Poor  Pain is worse with: bending, inactivity, standing and some activites Pain improves with: heat/ice and medication Relief from Meds: 5  Mobility walk without assistance how many minutes can you walk? ? ability to climb steps?  yes do you drive?  yes  Function disabled: date disabled 2007 I need assistance with the following:  household duties and shopping  Neuro/Psych bladder control problems bowel control problems weakness numbness tremor tingling spasms dizziness  Prior Studies Any changes since last visit?  no  Physicians involved in your care Any changes since last visit?  no   Family History  Problem Relation Age of Onset  . Coronary artery disease Mother   . Diabetes Mother        borderline  . Kidney disease Mother        stage 4  . Coronary artery disease Father   . Prostate cancer Father   . Lung disease Father        black lung  . Liver cancer Maternal Grandmother   . Liver disease Maternal Grandmother   . Polymyalgia rheumatica Sister   . Heart disease  Sister   . Coronary artery disease Brother        heart attack  . COPD Brother   . Colon polyps Sister   . Lung cancer Paternal Grandmother   . Colon cancer Unknown        Both sides   Social History   Socioeconomic History  . Marital status: Married    Spouse name: Not on file  . Number of children: 1  . Years of education: Not on file  . Highest education level: GED or equivalent  Occupational History  . Occupation: Agricultural engineer  Social Needs  . Financial resource strain: Not hard at all  . Food insecurity:    Worry: Never true    Inability: Never true  . Transportation needs:    Medical: No    Non-medical: No  Tobacco Use  . Smoking status: Former Smoker    Packs/day: 1.00    Years: 42.00    Pack years: 42.00    Types: Cigarettes    Last attempt to quit: 06/01/2015    Years since quitting: 3.3  . Smokeless tobacco: Never Used  . Tobacco comment: would like to try chantix, but has not started on it due to warnings related to depression  Substance and Sexual Activity  .  Alcohol use: No    Alcohol/week: 0.0 standard drinks  . Drug use: No  . Sexual activity: Yes  Lifestyle  . Physical activity:    Days per week: 3 days    Minutes per session: 30 min  . Stress: Not at all  Relationships  . Social connections:    Talks on phone: More than three times a week    Gets together: More than three times a week    Attends religious service: 1 to 4 times per year    Active member of club or organization: No    Attends meetings of clubs or organizations: Never    Relationship status: Married  Other Topics Concern  . Not on file  Social History Narrative  . Not on file   Past Surgical History:  Procedure Laterality Date  . ABDOMINAL HYSTERECTOMY     age 63  . ANAL RECTAL MANOMETRY N/A 12/09/2015   Procedure: ANO RECTAL MANOMETRY;  Surgeon: Mauri Pole, MD;  Location: [facility];  Service: Endoscopy;  Laterality: N/A;  . BLADDER REPAIR     sling/with  perforation  . CESAREAN SECTION     1 time  . KNEE ARTHROSCOPY Left   . ruptured uterus    . SHOULDER ARTHROSCOPY     multiple   Past Medical History:  Diagnosis Date  . Anxiety   . Bowel habit changes    excessive gas /frequent BM's with change in shape with pus  . Chronic left shoulder pain    frozen   . Chronic pain    left upper shoulder ,neck and back  . COPD (chronic obstructive pulmonary disease) ([place])    Dr. Redge Gainer  . Depression   . GERD (gastroesophageal reflux disease)   . Hyperlipidemia   . Maternal complication related to childbirth    Sepsis, staph  . Meniere's disease   . Nicotine addiction    2 packs per day   . Rapid or irregular heartbeat   . Skin cancer    Rt. leg, squamous cell  . Tubular adenoma of colon 01/31/2000   Dr. Richmond Campbell  . Weakness of pelvic floor    prolapse -  MRI from 2017   BP 115/79   Pulse 88   Ht 5\' 6"  (1.676 m)   Wt 154 lb (69.9 kg)   SpO2 97%   BMI 24.86 kg/m   Opioid Risk Score:   Fall Risk Score:  `1  Depression screen PHQ 2/9  Depression screen [facility] 2/9 10/04/2018 06/20/2018 06/19/2018 02/27/2018 12/25/2017 12/20/2017 12/19/2017  Decreased Interest 1 0 1 1 0 2 2  Down, Depressed, Hopeless 1 0 1 1 0 0 0  PHQ - 2 Score 2 0 2 2 0 2 2  Altered sleeping 2 - - - 0 2 2  Tired, decreased energy 3 - - - 0 3 3  Change in appetite 3 - - - 0 2 2  Feeling bad or failure about yourself  0 - - - 0 0 0  Trouble concentrating 1 - - - 0 1 1  Moving slowly or fidgety/restless 0 - - - 0 0 0  Suicidal thoughts 0 - - - 0 0 0  PHQ-9 Score 11 - - - 0 10 10  Some recent data might be hidden    Review of Systems  Constitutional: Positive for diaphoresis and unexpected weight change.  Eyes: Negative.   Respiratory: Negative.   Gastrointestinal: Positive for abdominal pain, constipation and  diarrhea.  Endocrine: Negative.   Genitourinary: Positive for difficulty urinating.  Musculoskeletal: Negative.   Skin: Negative.     Allergic/Immunologic: Negative.   Neurological: Positive for dizziness, weakness and numbness.       Tingling  Hematological: Negative.   All other systems reviewed and are negative.      Objective:   Physical Exam General: No acute distress HEENT: EOMI, oral membranes moist Cards: reg rate  Chest: normal effort Abdomen: Soft, NT, ND Skin: dry, intact Extremities: no edema   Skin:visible skin intact  Neuro:cognitvely intact. Strength 5/5. Normal sensory Musculoskeletal:Left shoulder limited with passive movement.  Left knee with mild tenderness.   Psych:Pleasant as always.  Assessment & Plan:  1. Post-traumatic arthritis left shoulder s/p 2 shoulder surgeries. Now with adhesive capsulitis left shoulderand chronic pain 2. Cervicalgia, spondylosis.  3. Right ITB syndrome  4. Depression/insomnia/anxiety  5. IBS 6. Bilateral knee pain, Left > right. ?likely meniscal/soft tissue injury 7. Multiple drug allergies 8. Hx of urinary incontinence, ?acute UTI   Plan:  1. Continue TENS to shoulder and neck. we have discussed  2.Frequent UTI's. Hx of bladder sling---   -still recommend follow up with urology 3. Gabapentin,  300mg  TID 4. continue diclofenac 75mg  BID. Move to PRN use of course of this year.  5.Knee pain stable 6.Increase hydrocodone to 7.5/325 one qday prn #30.  We will continue the controlled substance monitoring program, this consists of regular clinic visits, examinations, routine drug screening, pill counts as well as use of [place] Controlled Substance Reporting System. [place] today.   -Medication was refilled and a second prescription was sent to the patient's pharmacy for next month.     7. Constipation: discussed bowel regimen. Probiotics reviewed.  7. Fatigue: Push fluids, begin multivitamin with fe. Follow up with primary if no improvement 8. Will replace lexapro with remeron to see if this helps mood.  . 9. I will  see her back in about 58month's time.  15 minutes of face to face patient care time were spent during this visit. All questions were encouraged and answered. Marland Kitchen Marland Kitchen

## 2018-10-19 ENCOUNTER — Telehealth: Payer: Self-pay | Admitting: Registered Nurse

## 2018-10-19 DIAGNOSIS — M19112 Post-traumatic osteoarthritis, left shoulder: Secondary | ICD-10-CM

## 2018-10-19 DIAGNOSIS — M47812 Spondylosis without myelopathy or radiculopathy, cervical region: Secondary | ICD-10-CM

## 2018-10-19 MED ORDER — DICLOFENAC SODIUM 75 MG PO TBEC: 75.0000 mg | DELAYED_RELEASE_TABLET | Freq: Two times a day (BID) | ORAL | 3 refills | Status: DC

## 2018-10-19 NOTE — Addendum Note (Signed)
Addended by: Alger Simons T on: 10/19/2018 11:45 AM   Modules accepted: Orders

## 2018-10-19 NOTE — Telephone Encounter (Signed)
Medication refilled, will fix note

## 2018-10-19 NOTE — Telephone Encounter (Signed)
Recieved electronic medication refill request for diclofenac tablets.  According to last note on 10-17-2018:  4.Holding diclofenac maintain at  [facility] if this means to continue maintenance medication of diclofenac, or to place a temporary hold on the prescription.  Please advise.

## 2018-11-12 ENCOUNTER — Other Ambulatory Visit: Payer: Self-pay | Admitting: Physical Medicine & Rehabilitation

## 2018-11-12 DIAGNOSIS — F329 Major depressive disorder, single episode, unspecified: Secondary | ICD-10-CM

## 2018-11-16 ENCOUNTER — Other Ambulatory Visit: Payer: Self-pay | Admitting: Family Medicine

## 2018-11-26 ENCOUNTER — Ambulatory Visit (INDEPENDENT_AMBULATORY_CARE_PROVIDER_SITE_OTHER): Payer: Medicare Other | Admitting: Family Medicine

## 2018-11-26 ENCOUNTER — Encounter: Payer: Self-pay | Admitting: Family Medicine

## 2018-11-26 ENCOUNTER — Other Ambulatory Visit: Payer: Self-pay

## 2018-11-26 VITALS — BP 115/75 | HR 83 | Temp 96.9°F | Ht 66.0 in | Wt 156.0 lb

## 2018-11-26 DIAGNOSIS — R300 Dysuria: Secondary | ICD-10-CM | POA: Diagnosis not present

## 2018-11-26 DIAGNOSIS — R251 Tremor, unspecified: Secondary | ICD-10-CM

## 2018-11-26 LAB — URINALYSIS
Bilirubin, UA: NEGATIVE
Glucose, UA: NEGATIVE
Ketones, UA: NEGATIVE
Leukocytes, UA: NEGATIVE
Nitrite, UA: NEGATIVE
Protein, UA: NEGATIVE
RBC, UA: NEGATIVE
Specific Gravity, UA: >1.03 — ABNORMAL HIGH (ref 1.005–1.030)
Urobilinogen, Ur: 0.2 mg/dL (ref 0.2–1.0)
pH, UA: 5 (ref 5.0–7.5)

## 2018-11-26 NOTE — Progress Notes (Signed)
Subjective:  Patient ID: Cassandra [name], female    DOB: 08/28/1956  Age: 63 y.o. MRN: [id number]  CC: Tremors (pt here today c/o shaking all over for the past few months)   HPI BANA BORGMEYER presents for tremor.  The tremor is idiopathic at rest and sometimes worsen when she tries to move her hands such as handle a fork.  She says she can hardly hold a fork anymore.  It seems that exertion tends to make it worse by that she means moderate exertion such as just walking around in her yard.  It started out with just her hands but now it is all over her body at times.  It comes several times a day last several minutes.  Goes away spontaneously.  It seemed to start a couple months ago.  She relates that she started taking mirtazapine for sleep about that time.  She had been taking gabapentin for some time prior to that.  Both medicines are noted for potential causing tremor.  Prior to the mirtazapine she has been taking Lexapro which has an SSRI can cause tremor.  However she said she was not having tremor at that time Dr. Tessa Lerner gave her the mirtazapine to take the place of the Lexapro because she needed something to help her with sleep.  She tells me she is never tried Tylenol PM.   Depression screen [facility] 2/9 11/26/2018 10/04/2018 06/20/2018  Decreased Interest 2 1 0  Down, Depressed, Hopeless 1 1 0  PHQ - 2 Score 3 2 0  Altered sleeping 3 2 -  Tired, decreased energy 3 3 -  Change in appetite 3 3 -  Feeling bad or failure about yourself  0 0 -  Trouble concentrating 1 1 -  Moving slowly or fidgety/restless 0 0 -  Suicidal thoughts 0 0 -  PHQ-9 Score 13 11 -  Some recent data might be hidden    History Emri has a past medical history of Anxiety, Bowel habit changes, Chronic left shoulder pain, Chronic pain, COPD (chronic obstructive pulmonary disease) (HCC), Depression, GERD (gastroesophageal reflux disease), Hyperlipidemia, Maternal complication related to childbirth, Meniere's disease,  Nicotine addiction, Rapid or irregular heartbeat, Skin cancer, Tubular adenoma of colon (01/31/2000), and Weakness of pelvic floor.   She has a past surgical history that includes Bladder repair; Shoulder arthroscopy; Abdominal hysterectomy; Knee arthroscopy (Left); Cesarean section; ruptured uterus; and Anal Rectal manometry (N/A, 12/09/2015).   Her family history includes COPD in her brother; Colon cancer in her unknown relative; Colon polyps in her sister; Coronary artery disease in her brother, father, and mother; Diabetes in her mother; Heart disease in her sister; Kidney disease in her mother; Liver cancer in her maternal grandmother; Liver disease in her maternal grandmother; Lung cancer in her paternal grandmother; Lung disease in her father; Polymyalgia rheumatica in her sister; Prostate cancer in her father.She reports that she quit smoking about 3 years ago. Her smoking use included cigarettes. She has a 42.00 pack-year smoking history. She has never used smokeless tobacco. She reports that she does not drink alcohol or use drugs.    ROS Review of Systems  Constitutional: Negative.   HENT: Negative for congestion.   Eyes: Negative for visual disturbance.  Respiratory: Negative for shortness of breath.   Cardiovascular: Negative for chest pain.  Gastrointestinal: Negative for abdominal pain, constipation, diarrhea, nausea and vomiting.  Genitourinary: Negative for difficulty urinating.  Musculoskeletal: Negative for arthralgias and myalgias.  Neurological: Negative for headaches.  Psychiatric/Behavioral: Negative for sleep disturbance.    Objective:  BP 115/75   Pulse 83   Temp (!) 96.9 F (36.1 C) (Oral)   Ht 5\' 6"  (1.676 m)   Wt 156 lb (70.8 kg)   BMI 25.18 kg/m   BP Readings from Last 3 Encounters:  11/26/18 115/75  10/17/18 115/79  10/04/18 135/69    Wt Readings from Last 3 Encounters:  11/26/18 156 lb (70.8 kg)  10/17/18 154 lb (69.9 kg)  10/04/18 156 lb 2 oz  (70.8 kg)     Physical Exam Constitutional:      General: She is not in acute distress.    Appearance: She is well-developed.  HENT:     Head: Normocephalic and atraumatic.     Right Ear: External ear normal.     Left Ear: External ear normal.     Nose: Nose normal.  Eyes:     Conjunctiva/sclera: Conjunctivae normal.     Pupils: Pupils are equal, round, and reactive to light.  Neck:     Musculoskeletal: Normal range of motion and neck supple.     Thyroid: No thyromegaly.  Cardiovascular:     Rate and Rhythm: Normal rate and regular rhythm.     Heart sounds: Normal heart sounds. No murmur.  Pulmonary:     Effort: Pulmonary effort is normal. No respiratory distress.     Breath sounds: Normal breath sounds. No wheezing or rales.  Abdominal:     General: Bowel sounds are normal. There is no distension.     Palpations: Abdomen is soft.     Tenderness: There is no abdominal tenderness.  Lymphadenopathy:     Cervical: No cervical adenopathy.  Skin:    General: Skin is warm and dry.  Neurological:     Mental Status: She is alert and oriented to person, place, and time.     Coordination: Coordination abnormal (fine tremor in hands at rest. No better with ROM).     Deep Tendon Reflexes: Reflexes are normal and symmetric.  Psychiatric:        Behavior: Behavior normal.        Thought Content: Thought content normal.        Judgment: Judgment normal.       Assessment & Plan:   Tyshell was seen today for tremors.  Diagnoses and all orders for this visit:  Tremor, unspecified       I am having [name]. Barbee "[name]" maintain her conjugated estrogens, hydroxypropyl methylcellulose / hypromellose, chlorhexidine, mupirocin ointment, cholecalciferol, ondansetron, fexofenadine, gabapentin, meclizine, ciprofloxacin, ALPRAZolam, HYDROcodone-acetaminophen, diclofenac, mirtazapine, and methocarbamol.  Allergies as of 11/26/2018      Reactions   Chantix [varenicline] Rash    Codeine Itching, Nausea And Vomiting   Cymbalta [duloxetine Hcl] Nausea Only   Livalo [pitavastatin] Other (See Comments)   Seizures    Metoprolol Other (See Comments)   Had episode of possible hypotension or seizure described and muscles clamping down and searing pain toe to head and near fall x2. Loss of bowel control. No further symptoms after stopped.   Morphine And Related Hives   Itching hive rash   Oxycodone-aspirin Nausea And Vomiting   Oxycontin [oxycodone Hcl] Other (See Comments)   Altered mental status   Propoxyphene N-acetaminophen Nausea And Vomiting   Trazodone And Nefazodone Other (See Comments)   Medication is too strong   Zithromax [azithromycin] Other (See Comments)   Unknown    Other Rash   Lipturzet caused enlarged veins in  legs   Oxycodone-acetaminophen Hives, Nausea And Vomiting, Rash   Septra [sulfamethoxazole-trimethoprim] Rash      Medication List       Accurate as of November 26, 2018  4:00 PM. Always use your most recent med list.        ALPRAZolam 1 MG tablet Commonly known as:  XANAX Take one-half  tablet every morning and 1 tablet every evening for Meniere's Disease   chlorhexidine 4 % external liquid Commonly known as:  Hibiclens Shower daily for 5 days. Apply all over, including scalp and leave on  5 min before rinsing.   cholecalciferol 1000 units tablet Commonly known as:  VITAMIN D Take 1,000 Units by mouth daily.   ciprofloxacin 250 MG tablet Commonly known as:  CIPRO Take 1 tablet (250 mg total) by mouth daily with breakfast.   conjugated estrogens vaginal cream Commonly known as:  PREMARIN Place vaginally daily.   diclofenac 75 MG EC tablet Commonly known as:  VOLTAREN Take 1 tablet (75 mg total) by mouth 2 (two) times daily with a meal.   fexofenadine 180 MG tablet Commonly known as:  ALLEGRA Take 1 tablet (180 mg total) by mouth daily as needed for allergies.   gabapentin 300 MG capsule Commonly known as:  NEURONTIN Take  1 capsule (300 mg total) by mouth 3 (three) times daily.   HYDROcodone-acetaminophen 7.5-325 MG tablet Commonly known as:  NORCO Take 1 tablet by mouth daily as needed for moderate pain.   hydroxypropyl methylcellulose / hypromellose 2.5 % ophthalmic solution Commonly known as:  ISOPTO TEARS / GONIOVISC Place 1 drop into both eyes as needed for dry eyes.   meclizine 25 MG tablet Commonly known as:  ANTIVERT TAKE 1 TABLET BY MOUTH TWO  TIMES DAILY AS NEEDED   methocarbamol 500 MG tablet Commonly known as:  ROBAXIN TAKE 1 TABLET BY MOUTH TWICE A DAY   mirtazapine 15 MG tablet Commonly known as:  REMERON TAKE 1 TABLET BY MOUTH EVERYDAY AT BEDTIME   mupirocin ointment 2 % Commonly known as:  BACTROBAN Place 1 application into the nose 2 (two) times daily.   ondansetron 8 MG disintegrating tablet Commonly known as:  ZOFRAN-ODT Take 1 tablet (8 mg total) by mouth every 6 (six) hours as needed for nausea or vomiting.      Taper off mirtazapine.  Use Tylenol PM to nightly as needed for sleep.   Follow-up as previously arranged and as needed. [name], M.D.

## 2018-11-26 NOTE — Patient Instructions (Signed)
Tylenol PM 2 at bedtime for sleep. Discontinue Mirtazapine (1/2 QHS x 1 week then stop)

## 2018-11-26 NOTE — Addendum Note (Signed)
Addended by: Marylin Crosby on: 11/26/2018 04:07 PM   Modules accepted: Orders

## 2018-11-27 LAB — CMP14+EGFR
ALT: 10 IU/L (ref 0–32)
AST: 20 IU/L (ref 0–40)
Albumin/Globulin Ratio: 1.9 (ref 1.2–2.2)
Albumin: 4.6 g/dL (ref 3.8–4.8)
Alkaline Phosphatase: 101 IU/L (ref 39–117)
BUN/Creatinine Ratio: 13 (ref 12–28)
BUN: 13 mg/dL (ref 8–27)
Bilirubin Total: 0.2 mg/dL (ref 0.0–1.2)
CALCIUM: 9.6 mg/dL (ref 8.7–10.3)
CO2: 21 mmol/L (ref 20–29)
CREATININE: 1.01 mg/dL — AB (ref 0.57–1.00)
Chloride: 100 mmol/L (ref 96–106)
GFR calc Af Amer: 69 mL/min/{1.73_m2} (ref >59)
GFR, EST NON AFRICAN AMERICAN: 60 mL/min/{1.73_m2} (ref >59)
Globulin, Total: 2.4 g/dL (ref 1.5–4.5)
Glucose: 86 mg/dL (ref 65–99)
Potassium: 4.1 mmol/L (ref 3.5–5.2)
Sodium: 142 mmol/L (ref 134–144)
Total Protein: 7 g/dL (ref 6.0–8.5)

## 2018-11-27 LAB — T4, FREE: Free T4: 1.19 ng/dL (ref 0.82–1.77)

## 2018-11-27 LAB — TSH: TSH: 1.41 u[IU]/mL (ref 0.450–4.500)

## 2018-11-27 NOTE — Progress Notes (Signed)
Hello Adellyn,  Your lab result is normal.Some minor variations that are not significant are commonly marked abnormal, but do not represent any medical problem for you.  Best regards, Zyire Eidson, M.D.

## 2018-12-12 ENCOUNTER — Other Ambulatory Visit: Payer: Self-pay

## 2018-12-12 ENCOUNTER — Encounter: Payer: Medicare Other | Attending: Physical Medicine & Rehabilitation | Admitting: Physical Medicine & Rehabilitation

## 2018-12-12 ENCOUNTER — Encounter: Payer: Self-pay | Admitting: Physical Medicine & Rehabilitation

## 2018-12-12 DIAGNOSIS — Z90710 Acquired absence of both cervix and uterus: Secondary | ICD-10-CM | POA: Insufficient documentation

## 2018-12-12 DIAGNOSIS — Z833 Family history of diabetes mellitus: Secondary | ICD-10-CM | POA: Insufficient documentation

## 2018-12-12 DIAGNOSIS — Z8371 Family history of colonic polyps: Secondary | ICD-10-CM | POA: Insufficient documentation

## 2018-12-12 DIAGNOSIS — K581 Irritable bowel syndrome with constipation: Secondary | ICD-10-CM

## 2018-12-12 DIAGNOSIS — K219 Gastro-esophageal reflux disease without esophagitis: Secondary | ICD-10-CM | POA: Insufficient documentation

## 2018-12-12 DIAGNOSIS — G4700 Insomnia, unspecified: Secondary | ICD-10-CM | POA: Insufficient documentation

## 2018-12-12 DIAGNOSIS — E785 Hyperlipidemia, unspecified: Secondary | ICD-10-CM | POA: Insufficient documentation

## 2018-12-12 DIAGNOSIS — M7631 Iliotibial band syndrome, right leg: Secondary | ICD-10-CM | POA: Insufficient documentation

## 2018-12-12 DIAGNOSIS — M7502 Adhesive capsulitis of left shoulder: Secondary | ICD-10-CM | POA: Diagnosis not present

## 2018-12-12 DIAGNOSIS — Z85828 Personal history of other malignant neoplasm of skin: Secondary | ICD-10-CM | POA: Insufficient documentation

## 2018-12-12 DIAGNOSIS — Z801 Family history of malignant neoplasm of trachea, bronchus and lung: Secondary | ICD-10-CM | POA: Insufficient documentation

## 2018-12-12 DIAGNOSIS — M47812 Spondylosis without myelopathy or radiculopathy, cervical region: Secondary | ICD-10-CM | POA: Insufficient documentation

## 2018-12-12 DIAGNOSIS — Z87891 Personal history of nicotine dependence: Secondary | ICD-10-CM | POA: Insufficient documentation

## 2018-12-12 DIAGNOSIS — Z8042 Family history of malignant neoplasm of prostate: Secondary | ICD-10-CM | POA: Insufficient documentation

## 2018-12-12 DIAGNOSIS — Z841 Family history of disorders of kidney and ureter: Secondary | ICD-10-CM | POA: Insufficient documentation

## 2018-12-12 DIAGNOSIS — J449 Chronic obstructive pulmonary disease, unspecified: Secondary | ICD-10-CM | POA: Insufficient documentation

## 2018-12-12 DIAGNOSIS — F419 Anxiety disorder, unspecified: Secondary | ICD-10-CM | POA: Insufficient documentation

## 2018-12-12 DIAGNOSIS — Z8249 Family history of ischemic heart disease and other diseases of the circulatory system: Secondary | ICD-10-CM | POA: Insufficient documentation

## 2018-12-12 DIAGNOSIS — M25561 Pain in right knee: Secondary | ICD-10-CM | POA: Insufficient documentation

## 2018-12-12 DIAGNOSIS — F329 Major depressive disorder, single episode, unspecified: Secondary | ICD-10-CM | POA: Insufficient documentation

## 2018-12-12 DIAGNOSIS — G8929 Other chronic pain: Secondary | ICD-10-CM | POA: Insufficient documentation

## 2018-12-12 DIAGNOSIS — Z825 Family history of asthma and other chronic lower respiratory diseases: Secondary | ICD-10-CM | POA: Insufficient documentation

## 2018-12-12 DIAGNOSIS — M19012 Primary osteoarthritis, left shoulder: Secondary | ICD-10-CM | POA: Insufficient documentation

## 2018-12-12 DIAGNOSIS — M25562 Pain in left knee: Secondary | ICD-10-CM | POA: Insufficient documentation

## 2018-12-12 MED ORDER — HYDROCODONE-ACETAMINOPHEN 7.5-325 MG PO TABS: 1.0000 | ORAL_TABLET | Freq: Every day | ORAL | 0 refills | Status: DC | PRN

## 2018-12-12 NOTE — Progress Notes (Signed)
Subjective:    Patient ID: Cassandra [name], female    DOB: 1956-01-22, 63 y.o.   MRN: [id number]  HPI  TELEHEALTH NOTE Due to national recommendations of social distancing due to COVID 19, an audio/video telehealth visit is felt to be most appropriate for this patient at this time.  See Chart message from today for the patient's consent to telehealth from [place].     Cassandra [name] is here in follow up of her chronic pain.  When I last saw her her depression was an issue as well as increased pain.  We decided to try Remeron in place of her Lexapro which did not work out well for her as it seemed to cause a lot of tremors.  Her primary physician converted her back to Lexapro which she is only taking 5 mg of currently.  She seems to be doing much better with this.  More importantly, she divorced her husband who apparently was causing a lot of distress and now she is been on her own.  She seems to be much more happy and free has noticed that only is her mood better but her pain levels have been a bit better also.  She is not always having to take a full hydrocodone for pain relief.  For exercise she was getting out of the house and walking.  She likes to walk with her sister who lives locally.  At last visit I did recommend probiotics such as in yogurt.  She started taking activia twice daily and has noticed substantial improvements in her bowels and is now quite regular.  Pain Inventory Average Pain 8 Pain Right Now 8 My pain is sharp, stabbing, tingling and aching  In the last 24 hours, has pain interfered with the following? General activity 2 Relation with others 0 Enjoyment of life 2 What TIME of day is your pain at its worst? morning and night Sleep (in general) Poor  Pain is worse with: bending, sitting, inactivity and some activites Pain improves with: rest, heat/ice, pacing activities and medication Relief from Meds: 5  Mobility walk without assistance  ability to climb steps?  yes do you drive?  yes  Function disabled: date disabled na I need assistance with the following:  dressing, household duties and shopping  Neuro/Psych bladder control problems bowel control problems weakness numbness tingling spasms dizziness  Prior Studies Any changes since last visit?  no  Physicians involved in your care Any changes since last visit?  no   Family History  Problem Relation Age of Onset  . Coronary artery disease Mother   . Diabetes Mother        borderline  . Kidney disease Mother        stage 4  . Coronary artery disease Father   . Prostate cancer Father   . Lung disease Father        black lung  . Liver cancer Maternal Grandmother   . Liver disease Maternal Grandmother   . Polymyalgia rheumatica Sister   . Heart disease Sister   . Coronary artery disease Brother        heart attack  . COPD Brother   . Colon polyps Sister   . Lung cancer Paternal Grandmother   . Colon cancer Unknown        Both sides   Social History   Socioeconomic History  . Marital status: Married    Spouse name: Not on file  . Number of  children: 1  . Years of education: Not on file  . Highest education level: GED or equivalent  Occupational History  . Occupation: Agricultural engineer  Social Needs  . Financial resource strain: Not hard at all  . Food insecurity:    Worry: Never true    Inability: Never true  . Transportation needs:    Medical: No    Non-medical: No  Tobacco Use  . Smoking status: Former Smoker    Packs/day: 1.00    Years: 42.00    Pack years: 42.00    Types: Cigarettes    Last attempt to quit: 06/01/2015    Years since quitting: 3.5  . Smokeless tobacco: Never Used  . Tobacco comment: would like to try chantix, but has not started on it due to warnings related to depression  Substance and Sexual Activity  . Alcohol use: No    Alcohol/week: 0.0 standard drinks  . Drug use: No  . Sexual activity: Yes  Lifestyle  .  Physical activity:    Days per week: 3 days    Minutes per session: 30 min  . Stress: Not at all  Relationships  . Social connections:    Talks on phone: More than three times a week    Gets together: More than three times a week    Attends religious service: 1 to 4 times per year    Active member of club or organization: No    Attends meetings of clubs or organizations: Never    Relationship status: Married  Other Topics Concern  . Not on file  Social History Narrative  . Not on file   Past Surgical History:  Procedure Laterality Date  . ABDOMINAL HYSTERECTOMY     age 73  . ANAL RECTAL MANOMETRY N/A 12/09/2015   Procedure: ANO RECTAL MANOMETRY;  Surgeon: Mauri Pole, MD;  Location: [facility];  Service: Endoscopy;  Laterality: N/A;  . BLADDER REPAIR     sling/with perforation  . CESAREAN SECTION     1 time  . KNEE ARTHROSCOPY Left   . ruptured uterus    . SHOULDER ARTHROSCOPY     multiple   Past Medical History:  Diagnosis Date  . Anxiety   . Bowel habit changes    excessive gas /frequent BM's with change in shape with pus  . Chronic left shoulder pain    frozen   . Chronic pain    left upper shoulder ,neck and back  . COPD (chronic obstructive pulmonary disease) ([place])    Dr. Redge Gainer  . Depression   . GERD (gastroesophageal reflux disease)   . Hyperlipidemia   . Maternal complication related to childbirth    Sepsis, staph  . Meniere's disease   . Nicotine addiction    2 packs per day   . Rapid or irregular heartbeat   . Skin cancer    Rt. leg, squamous cell  . Tubular adenoma of colon 01/31/2000   Dr. Richmond Campbell  . Weakness of pelvic floor    prolapse -  MRI from 2017   BP 118/78 Comment: pt reported, virtual call  Pulse 90 Comment: unknown, virtual call  Temp (!) 96.7 F (35.9 C) Comment: unknown, virtual call  Ht 5\' 6"  (1.676 m)   Wt 156 lb (70.8 kg)   BMI 25.18 kg/m   Opioid Risk Score:   Fall Risk Score:  `1  Depression  screen [facility] 2/9  Depression screen [facility] 2/9 12/12/2018 11/26/2018 10/04/2018 06/20/2018 06/19/2018 02/27/2018  12/25/2017  Decreased Interest 1 2 1  0 1 1 0  Down, Depressed, Hopeless 1 1 1  0 1 1 0  PHQ - 2 Score 2 3 2  0 2 2 0  Altered sleeping - 3 2 - - - 0  Tired, decreased energy - 3 3 - - - 0  Change in appetite - 3 3 - - - 0  Feeling bad or failure about yourself  - 0 0 - - - 0  Trouble concentrating - 1 1 - - - 0  Moving slowly or fidgety/restless - 0 0 - - - 0  Suicidal thoughts - 0 0 - - - 0  PHQ-9 Score - 13 11 - - - 0  Some recent data might be hidden    Review of Systems  Constitutional: Positive for appetite change.  HENT: Negative.   Eyes: Negative.   Respiratory: Positive for shortness of breath.   Gastrointestinal: Positive for nausea and vomiting.  Endocrine: Negative.   Genitourinary: Positive for dyspareunia.  Musculoskeletal: Negative.        Spasms  Skin: Negative.   Allergic/Immunologic: Negative.   Neurological: Positive for dizziness, tremors, weakness and numbness.       Tingling  Psychiatric/Behavioral: Negative.   All other systems reviewed and are negative.  Assessment/Summary:  1. Post-traumatic arthritis left shoulder s/p 2 shoulder surgeries. Now with adhesive capsulitis left shoulderand chronic pain 2. Cervicalgia, spondylosis.  3. Right ITB syndrome  4. Depression/insomnia/anxiety  5. IBS 6. Bilateral knee pain, Left > right. ?likely meniscal/soft tissue injury 7. Multiple drug allergies 8. Hx of urinary incontinence, ?acute UTI   Plan:  1. Continue TENS to shoulder and neck.we have discussed  2.Frequent UTI's. Hx of bladder sling---      -Recommended follow-up with urology at some point y 14. Gabapentin,  300mg  TID 4.continue diclofenac 75mg  BID. Move to PRN use of course of this year.  5.Knee pain stable 6. Maintain hydrocodone at 7.5/325 one qday prn#30. We will continue the controlled substance monitoring program, this consists  of regular clinic visits, examinations, routine drug screening, pill counts as well as use of [place] Controlled Substance Reporting System. [place].  .   -Medication was refilled and a second prescription was sent to the patient's pharmacy for next month.  .     7. Constipation: continue with probiotic.  7. Fatigue: better with improved mood. 8. Lexapro---can consider coming off next month once we're through this pandemic and things have settled down a bit.  She is only on 5 mg currently.   I will see her back in about 2 months time.  About 15 minutes of telephone time was spent today in review of her medical conditions and recommendations. Marland Kitchen Marland Kitchen

## 2018-12-15 ENCOUNTER — Other Ambulatory Visit: Payer: Self-pay | Admitting: Family Medicine

## 2018-12-16 ENCOUNTER — Other Ambulatory Visit: Payer: Self-pay | Admitting: Physical Medicine & Rehabilitation

## 2018-12-16 DIAGNOSIS — M47812 Spondylosis without myelopathy or radiculopathy, cervical region: Secondary | ICD-10-CM

## 2018-12-21 ENCOUNTER — Ambulatory Visit: Payer: Medicare Other | Admitting: Family Medicine

## 2018-12-26 ENCOUNTER — Ambulatory Visit: Payer: Medicare Other | Admitting: Family Medicine

## 2019-01-09 ENCOUNTER — Other Ambulatory Visit: Payer: Self-pay | Admitting: Physical Medicine & Rehabilitation

## 2019-01-09 DIAGNOSIS — M47812 Spondylosis without myelopathy or radiculopathy, cervical region: Secondary | ICD-10-CM

## 2019-02-06 ENCOUNTER — Encounter: Payer: Self-pay | Admitting: Physical Medicine & Rehabilitation

## 2019-02-06 ENCOUNTER — Other Ambulatory Visit: Payer: Self-pay

## 2019-02-06 ENCOUNTER — Encounter: Payer: Medicare Other | Attending: Physical Medicine & Rehabilitation | Admitting: Physical Medicine & Rehabilitation

## 2019-02-06 DIAGNOSIS — Z833 Family history of diabetes mellitus: Secondary | ICD-10-CM | POA: Insufficient documentation

## 2019-02-06 DIAGNOSIS — Z8042 Family history of malignant neoplasm of prostate: Secondary | ICD-10-CM | POA: Insufficient documentation

## 2019-02-06 DIAGNOSIS — M25561 Pain in right knee: Secondary | ICD-10-CM | POA: Insufficient documentation

## 2019-02-06 DIAGNOSIS — Z85828 Personal history of other malignant neoplasm of skin: Secondary | ICD-10-CM | POA: Insufficient documentation

## 2019-02-06 DIAGNOSIS — F329 Major depressive disorder, single episode, unspecified: Secondary | ICD-10-CM | POA: Insufficient documentation

## 2019-02-06 DIAGNOSIS — Z825 Family history of asthma and other chronic lower respiratory diseases: Secondary | ICD-10-CM | POA: Insufficient documentation

## 2019-02-06 DIAGNOSIS — G4700 Insomnia, unspecified: Secondary | ICD-10-CM | POA: Insufficient documentation

## 2019-02-06 DIAGNOSIS — M47812 Spondylosis without myelopathy or radiculopathy, cervical region: Secondary | ICD-10-CM | POA: Insufficient documentation

## 2019-02-06 DIAGNOSIS — Z87891 Personal history of nicotine dependence: Secondary | ICD-10-CM | POA: Insufficient documentation

## 2019-02-06 DIAGNOSIS — Z841 Family history of disorders of kidney and ureter: Secondary | ICD-10-CM | POA: Insufficient documentation

## 2019-02-06 DIAGNOSIS — M7502 Adhesive capsulitis of left shoulder: Secondary | ICD-10-CM | POA: Diagnosis not present

## 2019-02-06 DIAGNOSIS — Z8249 Family history of ischemic heart disease and other diseases of the circulatory system: Secondary | ICD-10-CM | POA: Insufficient documentation

## 2019-02-06 DIAGNOSIS — K581 Irritable bowel syndrome with constipation: Secondary | ICD-10-CM | POA: Diagnosis not present

## 2019-02-06 DIAGNOSIS — M19012 Primary osteoarthritis, left shoulder: Secondary | ICD-10-CM | POA: Insufficient documentation

## 2019-02-06 DIAGNOSIS — Z801 Family history of malignant neoplasm of trachea, bronchus and lung: Secondary | ICD-10-CM | POA: Insufficient documentation

## 2019-02-06 DIAGNOSIS — K219 Gastro-esophageal reflux disease without esophagitis: Secondary | ICD-10-CM | POA: Insufficient documentation

## 2019-02-06 DIAGNOSIS — G8929 Other chronic pain: Secondary | ICD-10-CM | POA: Insufficient documentation

## 2019-02-06 DIAGNOSIS — E785 Hyperlipidemia, unspecified: Secondary | ICD-10-CM | POA: Insufficient documentation

## 2019-02-06 DIAGNOSIS — Z8371 Family history of colonic polyps: Secondary | ICD-10-CM | POA: Insufficient documentation

## 2019-02-06 DIAGNOSIS — M7631 Iliotibial band syndrome, right leg: Secondary | ICD-10-CM | POA: Insufficient documentation

## 2019-02-06 DIAGNOSIS — Z90710 Acquired absence of both cervix and uterus: Secondary | ICD-10-CM | POA: Insufficient documentation

## 2019-02-06 DIAGNOSIS — F419 Anxiety disorder, unspecified: Secondary | ICD-10-CM | POA: Insufficient documentation

## 2019-02-06 DIAGNOSIS — J449 Chronic obstructive pulmonary disease, unspecified: Secondary | ICD-10-CM | POA: Insufficient documentation

## 2019-02-06 DIAGNOSIS — M25562 Pain in left knee: Secondary | ICD-10-CM | POA: Insufficient documentation

## 2019-02-06 MED ORDER — HYDROCODONE-ACETAMINOPHEN 7.5-325 MG PO TABS: 1.0000 | ORAL_TABLET | Freq: Every day | ORAL | 0 refills | Status: DC | PRN

## 2019-02-06 NOTE — Progress Notes (Signed)
Subjective:    Patient ID: Cassandra [name], female    DOB: Aug 14, 1956, 63 y.o.   MRN: [id number]  HPI   Due to national recommendations of social distancing because of COVID 63, an audio/video tele-health visit is felt to be the most appropriate encounter for this patient at this time. See MyChart message from today for the patient's consent to a tele-health encounter with [place]. This is a follow up telephone visit for the patient who is at home. MD is at office.    I am meeting with the patient today regarding her chronic pain.  She states that she has been doing well for the most part with her pain control.  Her medication seems to be working for her which includes hydrocodone, gabapentin and Voltaren as well as Robaxin.  She is dealing fairly well with the stay at home situation with COVID.  She is try to get out and exercise when she can.  She has a park behind her house and walks out there with her sister frequently. She discontinued lexapro without any issues   Pain Inventory Average Pain 7 Pain Right Now 7 My pain is constant, sharp, burning, dull, stabbing, tingling and aching  In the last 24 hours, has pain interfered with the following? General activity 7 Relation with others 7 Enjoyment of life 7 What TIME of day is your pain at its worst? daytime Sleep (in general) Poor  Pain is worse with: walking, bending, standing and some activites Pain improves with: rest, heat/ice and medication Relief from Meds: 5  Mobility walk without assistance ability to climb steps?  yes do you drive?  yes  Function disabled: date disabled 2005  Neuro/Psych bladder control problems bowel control problems weakness numbness tremor tingling spasms dizziness depression anxiety  Prior Studies Any changes since last visit?  no  Physicians involved in your care Any changes since last visit?  no   Family History  Problem Relation Age of  Onset  . Coronary artery disease Mother   . Diabetes Mother        borderline  . Kidney disease Mother        stage 4  . Coronary artery disease Father   . Prostate cancer Father   . Lung disease Father        black lung  . Liver cancer Maternal Grandmother   . Liver disease Maternal Grandmother   . Polymyalgia rheumatica Sister   . Heart disease Sister   . Coronary artery disease Brother        heart attack  . COPD Brother   . Colon polyps Sister   . Lung cancer Paternal Grandmother   . Colon cancer Unknown        Both sides   Social History   Socioeconomic History  . Marital status: Married    Spouse name: Not on file  . Number of children: 1  . Years of education: Not on file  . Highest education level: GED or equivalent  Occupational History  . Occupation: Agricultural engineer  Social Needs  . Financial resource strain: Not hard at all  . Food insecurity:    Worry: Never true    Inability: Never true  . Transportation needs:    Medical: No    Non-medical: No  Tobacco Use  . Smoking status: Former Smoker    Packs/day: 1.00    Years: 42.00    Pack years: 42.00    Types: Cigarettes  Last attempt to quit: 06/01/2015    Years since quitting: 3.6  . Smokeless tobacco: Never Used  . Tobacco comment: would like to try chantix, but has not started on it due to warnings related to depression  Substance and Sexual Activity  . Alcohol use: No    Alcohol/week: 0.0 standard drinks  . Drug use: No  . Sexual activity: Yes  Lifestyle  . Physical activity:    Days per week: 3 days    Minutes per session: 30 min  . Stress: Not at all  Relationships  . Social connections:    Talks on phone: More than three times a week    Gets together: More than three times a week    Attends religious service: 1 to 4 times per year    Active member of club or organization: No    Attends meetings of clubs or organizations: Never    Relationship status: Married  Other Topics Concern  . Not  on file  Social History Narrative  . Not on file   Past Surgical History:  Procedure Laterality Date  . ABDOMINAL HYSTERECTOMY     age 40  . ANAL RECTAL MANOMETRY N/A 12/09/2015   Procedure: ANO RECTAL MANOMETRY;  Surgeon: Mauri Pole, MD;  Location: [facility];  Service: Endoscopy;  Laterality: N/A;  . BLADDER REPAIR     sling/with perforation  . CESAREAN SECTION     1 time  . KNEE ARTHROSCOPY Left   . ruptured uterus    . SHOULDER ARTHROSCOPY     multiple   Past Medical History:  Diagnosis Date  . Anxiety   . Bowel habit changes    excessive gas /frequent BM's with change in shape with pus  . Chronic left shoulder pain    frozen   . Chronic pain    left upper shoulder ,neck and back  . COPD (chronic obstructive pulmonary disease) ([place])    Dr. Redge Gainer  . Depression   . GERD (gastroesophageal reflux disease)   . Hyperlipidemia   . Maternal complication related to childbirth    Sepsis, staph  . Meniere's disease   . Nicotine addiction    2 packs per day   . Rapid or irregular heartbeat   . Skin cancer    Rt. leg, squamous cell  . Tubular adenoma of colon 01/31/2000   Dr. Richmond Campbell  . Weakness of pelvic floor    prolapse -  MRI from 2017   Ht 5' 6.5" (1.689 m)   Wt 156 lb (70.8 kg)   BMI 24.80 kg/m   Opioid Risk Score:   Fall Risk Score:  `1  Depression screen PHQ 2/9  Depression screen [facility] 2/9 12/12/2018 11/26/2018 10/04/2018 06/20/2018 06/19/2018 02/27/2018 12/25/2017  Decreased Interest 1 2 1  0 1 1 0  Down, Depressed, Hopeless 1 1 1  0 1 1 0  PHQ - 2 Score 2 3 2  0 2 2 0  Altered sleeping - 3 2 - - - 0  Tired, decreased energy - 3 3 - - - 0  Change in appetite - 3 3 - - - 0  Feeling bad or failure about yourself  - 0 0 - - - 0  Trouble concentrating - 1 1 - - - 0  Moving slowly or fidgety/restless - 0 0 - - - 0  Suicidal thoughts - 0 0 - - - 0  PHQ-9 Score - 13 11 - - - 0  Some recent data  might be hidden     Review of Systems   Constitutional: Negative.   HENT: Negative.   Eyes: Negative.   Respiratory: Negative.   Cardiovascular: Negative.   Gastrointestinal: Negative.   Endocrine: Negative.   Genitourinary: Positive for difficulty urinating.  Musculoskeletal: Positive for arthralgias, back pain, myalgias and neck pain.  Skin: Negative.   Neurological: Positive for tremors, weakness and numbness.  Hematological: Negative.   Psychiatric/Behavioral: Positive for dysphoric mood. The patient is nervous/anxious.   All other systems reviewed and are negative.        Assessment & Plan:  1. Post-traumatic arthritis left shoulder s/p 2 shoulder surgeries. Now with adhesive capsulitis left shoulderand chronic pain 2. Cervicalgia, spondylosis.  3. Right ITB syndrome  4. Depression/insomnia/anxiety  5.IBS 6. Bilateral knee pain, Left > right. ?likely meniscal/soft tissue injury 7. Multiple drug allergies 8. Hx of urinary incontinence, ?acute UTI   Plan:  1.  Continue TENS unit at home 2.Frequent UTI's. Hx of bladder sling--- -Urology follow-up has been recommended 3. Gabapentin, 300mg  TID 4.continue diclofenac 75mg  BID as needed. 5.Knee pain stable at present 6. Maintain hydrocodone at 7.5/325 one qday prn#30.Refilled today We will continue the controlled substance monitoring program, this consists of regular clinic visits, examinations, routine drug screening, pill counts as well as use of [place] Controlled Substance Reporting System. [place] today.  -Medication was refilled and a second prescription was sent to the patient's pharmacy for next month.    Marland Kitchen 7.Constipation: continue with probiotic.  7. Fatigue: better with improved mood.          8.Lexapro--- patient has discontinued  9 minutes of tele-visit time was spent with this patient today. Follow up in 2 months

## 2019-02-12 ENCOUNTER — Ambulatory Visit: Payer: Self-pay | Admitting: Physical Medicine & Rehabilitation

## 2019-04-03 ENCOUNTER — Encounter: Payer: Medicare Other | Admitting: Registered Nurse

## 2019-04-03 ENCOUNTER — Encounter (HOSPITAL_COMMUNITY): Payer: Self-pay | Admitting: Emergency Medicine

## 2019-04-03 ENCOUNTER — Other Ambulatory Visit: Payer: Self-pay

## 2019-04-03 ENCOUNTER — Emergency Department (HOSPITAL_COMMUNITY)
Admission: EM | Admit: 2019-04-03 | Discharge: 2019-04-03 | Disposition: A | Discharge status: Home / Self Care | Payer: Medicare Other | Attending: Emergency Medicine | Admitting: Emergency Medicine

## 2019-04-03 ENCOUNTER — Emergency Department (HOSPITAL_COMMUNITY): Payer: Medicare Other

## 2019-04-03 DIAGNOSIS — R0789 Other chest pain: Secondary | ICD-10-CM

## 2019-04-03 DIAGNOSIS — Z79899 Other long term (current) drug therapy: Secondary | ICD-10-CM | POA: Insufficient documentation

## 2019-04-03 DIAGNOSIS — J439 Emphysema, unspecified: Secondary | ICD-10-CM | POA: Diagnosis not present

## 2019-04-03 DIAGNOSIS — R55 Syncope and collapse: Secondary | ICD-10-CM | POA: Diagnosis not present

## 2019-04-03 DIAGNOSIS — Z87891 Personal history of nicotine dependence: Secondary | ICD-10-CM | POA: Diagnosis not present

## 2019-04-03 DIAGNOSIS — N390 Urinary tract infection, site not specified: Secondary | ICD-10-CM

## 2019-04-03 DIAGNOSIS — I700 Atherosclerosis of aorta: Secondary | ICD-10-CM | POA: Diagnosis not present

## 2019-04-03 DIAGNOSIS — J449 Chronic obstructive pulmonary disease, unspecified: Secondary | ICD-10-CM | POA: Insufficient documentation

## 2019-04-03 DIAGNOSIS — J984 Other disorders of lung: Secondary | ICD-10-CM | POA: Diagnosis not present

## 2019-04-03 DIAGNOSIS — Q2546 Tortuous aortic arch: Secondary | ICD-10-CM | POA: Diagnosis not present

## 2019-04-03 DIAGNOSIS — R079 Chest pain, unspecified: Secondary | ICD-10-CM | POA: Diagnosis present

## 2019-04-03 LAB — URINALYSIS, ROUTINE W REFLEX MICROSCOPIC
Bilirubin Urine: NEGATIVE
Glucose, UA: NEGATIVE mg/dL
Hgb urine dipstick: NEGATIVE
Ketones, ur: NEGATIVE mg/dL
Leukocytes,Ua: NEGATIVE
Nitrite: POSITIVE — AB
Protein, ur: NEGATIVE mg/dL
Specific Gravity, Urine: 1.02 (ref 1.005–1.030)
pH: 5 (ref 5.0–8.0)

## 2019-04-03 LAB — CBC WITH DIFFERENTIAL/PLATELET
Abs Immature Granulocytes: 0.01 10*3/uL (ref 0.00–0.07)
Basophils Absolute: 0 10*3/uL (ref 0.0–0.1)
Basophils Relative: 1 %
Eosinophils Absolute: 0.1 10*3/uL (ref 0.0–0.5)
Eosinophils Relative: 1 %
HCT: 43.3 % (ref 36.0–46.0)
Hemoglobin: 14 g/dL (ref 12.0–15.0)
Immature Granulocytes: 0 %
Lymphocytes Relative: 20 %
Lymphs Abs: 1.3 10*3/uL (ref 0.7–4.0)
MCH: 29.8 pg (ref 26.0–34.0)
MCHC: 32.3 g/dL (ref 30.0–36.0)
MCV: 92.1 fL (ref 80.0–100.0)
Monocytes Absolute: 0.5 10*3/uL (ref 0.1–1.0)
Monocytes Relative: 8 %
Neutro Abs: 4.7 10*3/uL (ref 1.7–7.7)
Neutrophils Relative %: 70 %
Platelets: 319 10*3/uL (ref 150–400)
RBC: 4.7 MIL/uL (ref 3.87–5.11)
RDW: 13.1 % (ref 11.5–15.5)
WBC: 6.6 10*3/uL (ref 4.0–10.5)
nRBC: 0 % (ref 0.0–0.2)

## 2019-04-03 LAB — COMPREHENSIVE METABOLIC PANEL
ALT: 11 U/L (ref 0–44)
AST: 16 U/L (ref 15–41)
Albumin: 3.8 g/dL (ref 3.5–5.0)
Alkaline Phosphatase: 83 U/L (ref 38–126)
Anion gap: 11 (ref 5–15)
BUN: 14 mg/dL (ref 8–23)
CO2: 24 mmol/L (ref 22–32)
Calcium: 9.2 mg/dL (ref 8.9–10.3)
Chloride: 105 mmol/L (ref 98–111)
Creatinine, Ser: 1.01 mg/dL — ABNORMAL HIGH (ref 0.44–1.00)
GFR calc Af Amer: >60 mL/min (ref >60)
GFR calc non Af Amer: 59 mL/min — ABNORMAL LOW (ref >60)
Glucose, Bld: 120 mg/dL — ABNORMAL HIGH (ref 70–99)
Potassium: 3.9 mmol/L (ref 3.5–5.1)
Sodium: 140 mmol/L (ref 135–145)
Total Bilirubin: 0.4 mg/dL (ref 0.3–1.2)
Total Protein: 7 g/dL (ref 6.5–8.1)

## 2019-04-03 LAB — TROPONIN I (HIGH SENSITIVITY)
Troponin I (High Sensitivity): 5 ng/L (ref <18)
Troponin I (High Sensitivity): 5 ng/L (ref <18)

## 2019-04-03 LAB — D-DIMER, QUANTITATIVE: D-Dimer, Quant: 0.44 ug/mL-FEU (ref 0.00–0.50)

## 2019-04-03 MED ORDER — CEPHALEXIN 500 MG PO CAPS: 500.0000 mg | ORAL_CAPSULE | Freq: Two times a day (BID) | ORAL | 0 refills | Status: DC

## 2019-04-03 NOTE — ED Triage Notes (Signed)
Pt arrives with reports of "thinking her body is shutting down." States she had sepsis when she was younger and feels the same now. Endorses weakness and chest pain. Thinks she may have had a heart attack last night. States she has had multiple episodes of passing out recently. Reports frequent UTI and currently on cipro.

## 2019-04-03 NOTE — ED Notes (Signed)
Pt verbalized understanding of discharge paperwork, prescription and follow-up care.

## 2019-04-03 NOTE — ED Provider Notes (Signed)
[facility] EMERGENCY DEPARTMENT Provider Note   CSN: [id number] Arrival date & time: 04/03/19  1231    History   Chief Complaint Chief Complaint  Patient presents with   Shortness of Breath   Chest Pain    HPI Cassandra [name] is a 63 y.o. female.     The history is provided by the patient and medical records. No language interpreter was used.  Shortness of Breath Associated symptoms: chest pain   Chest Pain Associated symptoms: shortness of breath    Cassandra [name] is a 63 y.o. female who presents to the Emergency Department complaining of chest pain.  She presents to the ED due to concern that she had a heart attack last night.  She has waxing and waning, sharp chest pain on the left side for about the last week.  She also complains of fatigue and feels like her body is shutting down on her.  No reports of fevers.  Denies cough.  She has significant dyspnea on exertion, nausea, vomiting.  She has also experienced a few syncopal events over the last two weeks.  She now keeps a shower chair because she can't stand in her shower.  She has associated several months of leg edema.   Past Medical History:  Diagnosis Date   Anxiety    Bowel habit changes    excessive gas /frequent BM's with change in shape with pus   Chronic left shoulder pain    frozen    Chronic pain    left upper shoulder ,neck and back   COPD (chronic obstructive pulmonary disease) ([place])    Dr. Redge Gainer   Depression    GERD (gastroesophageal reflux disease)    Hyperlipidemia    Maternal complication related to childbirth    Sepsis, staph   Meniere's disease    Nicotine addiction    2 packs per day    Rapid or irregular heartbeat    Skin cancer    Rt. leg, squamous cell   Tubular adenoma of colon 01/31/2000   Dr. Dellis Filbert Medoff   Weakness of pelvic floor    prolapse -  MRI from 2017    Patient Active Problem List   Diagnosis Date Noted   Bilateral knee pain  06/12/2017   Aortic atherosclerosis ([place]) 05/30/2016   Emphysema of lung ([place]) 05/30/2016   Insomnia 03/24/2016   Depression 03/24/2016   GAD (generalized anxiety disorder) 03/24/2016   IBS (irritable bowel syndrome) 12/28/2015   Fecal incontinence    C7 radiculopathy 06/17/2014   Cervical spondylosis without myelopathy 12/18/2013   Iliotibial band syndrome of right side 09/20/2013   Adhesive capsulitis of left shoulder 09/20/2013   Degenerative arthritis of left shoulder region 09/20/2013   Myofascial pain 09/20/2013   Personal history of colonic polyps 02/07/2013   Loss of weight 01/11/2013   Hyperlipidemia 11/29/2010    Past Surgical History:  Procedure Laterality Date   ABDOMINAL HYSTERECTOMY     age 39   ANAL RECTAL MANOMETRY N/A 12/09/2015   Procedure: ANO RECTAL MANOMETRY;  Surgeon: Mauri Pole, MD;  Location: [facility];  Service: Endoscopy;  Laterality: N/A;   BLADDER REPAIR     sling/with perforation   CESAREAN SECTION     1 time   KNEE ARTHROSCOPY Left    ruptured uterus     SHOULDER ARTHROSCOPY     multiple     OB History    Gravida  2   Para  1  Term  1   Preterm      AB      Living  1     SAB      TAB      Ectopic      Multiple      Live Births               Home Medications    Prior to Admission medications   Medication Sig Start Date End Date Taking? Authorizing Provider  chlorhexidine (HIBICLENS) 4 % external liquid Shower daily for 5 days. Apply all over, including scalp and leave on  5 min before rinsing. Patient taking differently: Apply 1 application topically See admin instructions. Shower daily for 5 days. Apply all over, including scalp and leave on  5 min before rinsing. 06/07/16  Yes [name], MD  cholecalciferol (VITAMIN D) 1000 units tablet Take 1,000 Units by mouth daily.   Yes [provider]  ciprofloxacin (CIPRO) 250 MG tablet Take 1 tablet (250 mg total) by mouth  daily with breakfast. 06/20/18  Yes [name], MD  conjugated estrogens (PREMARIN) vaginal cream Place vaginally daily. Patient taking differently: Place 1 Applicatorful vaginally daily.  12/07/15  Yes [name], MD  diclofenac (VOLTAREN) 75 MG EC tablet Take 1 tablet (75 mg total) by mouth 2 (two) times daily with a meal. 10/19/18  Yes Meredith Staggers, MD  fexofenadine (ALLEGRA) 180 MG tablet Take 1 tablet (180 mg total) by mouth daily as needed for allergies. 12/19/17  Yes Stacks, Cletus Gash, MD  HYDROcodone-acetaminophen (NORCO) 7.5-325 MG tablet Take 1 tablet by mouth daily as needed for moderate pain. 02/06/19  Yes Meredith Staggers, MD  hydroxypropyl methylcellulose / hypromellose (ISOPTO TEARS / GONIOVISC) 2.5 % ophthalmic solution Place 1 drop into both eyes as needed for dry eyes.   Yes [provider]  meclizine (ANTIVERT) 25 MG tablet TAKE 1 TABLET BY MOUTH TWO  TIMES DAILY AS NEEDED Patient taking differently: Take 25 mg by mouth 2 (two) times daily as needed for dizziness or nausea.  03/20/18  Yes Stacks, Cletus Gash, MD  methocarbamol (ROBAXIN) 500 MG tablet TAKE 1 TABLET BY MOUTH TWICE A DAY Patient taking differently: Take 500 mg by mouth 2 (two) times a day.  11/16/18  Yes Stacks, Cletus Gash, MD  mupirocin ointment (BACTROBAN) 2 % Place 1 application into the nose 2 (two) times daily. 06/07/16  Yes [name], MD  ondansetron (ZOFRAN-ODT) 8 MG disintegrating tablet Take 1 tablet (8 mg total) by mouth every 6 (six) hours as needed for nausea or vomiting. 06/20/17  Yes Stacks, Cletus Gash, MD  cephALEXin (KEFLEX) 500 MG capsule Take 1 capsule (500 mg total) by mouth 2 (two) times daily. 04/03/19   Quintella Reichert, MD    Family History Family History  Problem Relation Age of Onset   Coronary artery disease Mother    Diabetes Mother        borderline   Kidney disease Mother        stage 4   Coronary artery disease Father    Prostate cancer Father    Lung disease Father         black lung   Liver cancer Maternal Grandmother    Liver disease Maternal Grandmother    Polymyalgia rheumatica Sister    Heart disease Sister    Coronary artery disease Brother        heart attack   COPD Brother    Colon polyps Sister    Lung  cancer Paternal Grandmother    Colon cancer Other        Both sides    Social History Social History   Tobacco Use   Smoking status: Former Smoker    Packs/day: 1.00    Years: 42.00    Pack years: 42.00    Types: Cigarettes    Quit date: 06/01/2015    Years since quitting: 3.8   Smokeless tobacco: Never Used   Tobacco comment: would like to try chantix, but has not started on it due to warnings related to depression  Substance Use Topics   Alcohol use: No    Alcohol/week: 0.0 standard drinks   Drug use: No     Allergies   Chantix [varenicline], Codeine, Cymbalta [duloxetine hcl], Livalo [pitavastatin], Metoprolol, Morphine and related, Oxycodone-aspirin, Oxycontin [oxycodone hcl], Propoxyphene n-acetaminophen, Remeron [mirtazapine], Trazodone and nefazodone, Zithromax [azithromycin], Other, Oxycodone-acetaminophen, and Septra [sulfamethoxazole-trimethoprim]   Review of Systems Review of Systems  Respiratory: Positive for shortness of breath.   Cardiovascular: Positive for chest pain.  All other systems reviewed and are negative.    Physical Exam Updated Vital Signs BP 123/62    Pulse 78    Temp 98.2 F (36.8 C) (Oral)    Resp 16    Ht 5\' 6"  (1.676 m)    Wt 68 kg    SpO2 96%    BMI 24.21 kg/m   Physical Exam Vitals signs and nursing note reviewed.  Constitutional:      Appearance: She is well-developed.  HENT:     Head: Normocephalic and atraumatic.  Cardiovascular:     Rate and Rhythm: Normal rate and regular rhythm.     Heart sounds: No murmur.  Pulmonary:     Effort: Pulmonary effort is normal. No respiratory distress.     Breath sounds: Normal breath sounds.  Abdominal:     Palpations: Abdomen is  soft.     Tenderness: There is no abdominal tenderness. There is no guarding or rebound.  Musculoskeletal:        General: No swelling or tenderness.  Skin:    General: Skin is warm and dry.  Neurological:     Mental Status: She is alert and oriented to person, place, and time.  Psychiatric:        Mood and Affect: Mood normal.        Behavior: Behavior normal.      ED Treatments / Results  Labs (all labs ordered are listed, but only abnormal results are displayed) Labs Reviewed  COMPREHENSIVE METABOLIC PANEL - Abnormal; Notable for the following components:      Result Value   Glucose, Bld 120 (*)    Creatinine, Ser 1.01 (*)    GFR calc non Af Amer 59 (*)    All other components within normal limits  URINALYSIS, ROUTINE W REFLEX MICROSCOPIC - Abnormal; Notable for the following components:   APPearance HAZY (*)    Nitrite POSITIVE (*)    Bacteria, UA MANY (*)    All other components within normal limits  URINE CULTURE  CBC WITH DIFFERENTIAL/PLATELET  D-DIMER, QUANTITATIVE (NOT AT [facility])  TROPONIN I (HIGH SENSITIVITY)  TROPONIN I (HIGH SENSITIVITY)    EKG EKG Interpretation  Date/Time:  Wednesday April 03 2019 12:52:25 EDT Ventricular Rate:  96 PR Interval:    QRS Duration: 90 QT Interval:  353 QTC Calculation: 447 R Axis:   5 Text Interpretation:  Sinus rhythm LAE, consider biatrial enlargement RSR' in V1 or V2, right VCD  or [place] no prior available for comparison Confirmed by Quintella Reichert [phone]) on 04/03/2019 12:54:34 PM   Radiology Dg Chest 2 View  Result Date: 04/03/2019 CLINICAL DATA:  Syncopal episodes. EXAM: CHEST - 2 VIEW COMPARISON:  Chest CT 02/22/2016 and chest x-ray 10/01/2014 FINDINGS: The heart is normal in size and stable. Stable mild tortuosity and calcification of the thoracic aorta. Slightly prominent right pulmonary hila appears stable when compared to the prior chest x-ray and chest CT. The lungs are clear of an acute process. No infiltrates or  effusions. No worrisome pulmonary lesions. Biapical pleural and parenchymal scarring changes are noted. Stable emphysematous changes. The bony thorax is intact. IMPRESSION: Emphysematous changes and pulmonary scarring along with biapical pleural and parenchymal scarring. Prominent right hilum appears stable. No acute pulmonary findings. Electronically Signed   By: Marijo Sanes M.D.   On: 04/03/2019 14:24    Procedures Procedures (including critical care time)  Medications Ordered in ED Medications - No data to display   Initial Impression / Assessment and Plan / ED Course  I have reviewed the triage vital signs and the nursing notes.  Pertinent labs & imaging results that were available during my care of the patient were reviewed by me and considered in my medical decision making (see chart for details).        Patient here for multiple complaints but predominantly chest pain, syncope as well as malaise. EKG without acute ischemic changes in initial troponin is negative. D dimer is negative feel patient is low risk for DVT/PE. Creatinine is mildly elevated, stable when compared to priors. No significant anemia. Plan to change her Cipro to Keflex given her nitrite positive UTI. Plan to discharge home if repeat troponin is within normal limits. Patient updated findings of studies. Discussed home care, outpatient follow-up and return precautions.  Final Clinical Impressions(s) / ED Diagnoses   Final diagnoses:  Atypical chest pain  Chronic UTI    ED Discharge Orders         Ordered    cephALEXin (KEFLEX) 500 MG capsule  2 times daily     04/03/19 1604           Quintella Reichert, MD 04/03/19 [phone]

## 2019-04-05 LAB — URINE CULTURE: Culture: >=100000 — AB

## 2019-04-06 ENCOUNTER — Telehealth: Payer: Self-pay | Admitting: *Deleted

## 2019-04-06 NOTE — Telephone Encounter (Signed)
Post ED Visit - Positive Culture Follow-up  Culture report reviewed by antimicrobial stewardship pharmacist: [place] Team []  Elenor Quinones, Pharm.D. []  Heide Guile, Pharm.D., BCPS AQ-ID []  Parks Neptune, Pharm.D., BCPS []  Alycia Rossetti, Pharm.D., BCPS []  [place], Pharm.D., BCPS, AAHIVP []  Legrand Como, Pharm.D., BCPS, AAHIVP [x]  Salome Arnt, PharmD, BCPS []  Johnnette Gourd, PharmD, BCPS []  Hughes Better, PharmD, BCPS []  Leeroy Cha, PharmD []  Laqueta Linden, PharmD, BCPS []  Albertina Parr, PharmD  [place] Team []  Leodis Sias, PharmD []  Lindell Spar, PharmD []  Royetta Asal, PharmD []  Graylin Shiver, Rph []  Rema Fendt) Glennon Mac, PharmD []  Arlyn Dunning, PharmD []  Netta Cedars, PharmD []  Dia Sitter, PharmD []  Leone Haven, PharmD []  Gretta Arab, PharmD []  Theodis Shove, PharmD []  Peggyann Juba, PharmD []  Reuel Boom, PharmD   Positive urine culture Treated with Cephalexin, organism sensitive to the same and no further patient follow-up is required at this time.  Harlon Flor [facility] 04/06/2019, 11:53 AM

## 2019-04-16 ENCOUNTER — Encounter: Payer: Self-pay | Admitting: Family Medicine

## 2019-04-16 ENCOUNTER — Ambulatory Visit (INDEPENDENT_AMBULATORY_CARE_PROVIDER_SITE_OTHER): Payer: Medicare Other | Admitting: Family Medicine

## 2019-04-16 DIAGNOSIS — R072 Precordial pain: Secondary | ICD-10-CM | POA: Diagnosis not present

## 2019-04-16 DIAGNOSIS — N3000 Acute cystitis without hematuria: Secondary | ICD-10-CM

## 2019-04-16 DIAGNOSIS — R55 Syncope and collapse: Secondary | ICD-10-CM

## 2019-04-16 MED ORDER — NITROFURANTOIN MONOHYD MACRO 100 MG PO CAPS: 100.0000 mg | ORAL_CAPSULE | Freq: Two times a day (BID) | ORAL | 0 refills | Status: DC

## 2019-04-16 NOTE — Progress Notes (Signed)
Subjective:    Patient ID: Cassandra [name], female    DOB: 1956-06-26, 63 y.o.   MRN: [id number]   HPI: [name] is a 63 y.o. female presenting for episode at E.D. of heart racing, Shaking so bad my whole body shook. Has calmed down. DX was UTI.  Keflex prescribed. No energy for quite some time. Has UTI sx even though she finished abx.   Went to the store. Had syncopal episode that precipitated the trip to the hospital. Had "minor" chest pain at the time. Described as shooting. Also was short of breath. Had negative troponin at E.D. Has Fhx of cardiac dx in Mom & dad.    Depression screen [facility] 2/9 12/12/2018 11/26/2018 10/04/2018 06/20/2018 06/19/2018  Decreased Interest 1 2 1  0 1  Down, Depressed, Hopeless 1 1 1  0 1  PHQ - 2 Score 2 3 2  0 2  Altered sleeping - 3 2 - -  Tired, decreased energy - 3 3 - -  Change in appetite - 3 3 - -  Feeling bad or failure about yourself  - 0 0 - -  Trouble concentrating - 1 1 - -  Moving slowly or fidgety/restless - 0 0 - -  Suicidal thoughts - 0 0 - -  PHQ-9 Score - 13 11 - -  Some recent data might be hidden     Relevant past medical, surgical, family and social history reviewed and updated as indicated.  Interim medical history since our last visit reviewed. Allergies and medications reviewed and updated.  ROS:  Review of Systems  Constitutional: Negative.   HENT: Negative for congestion.   Eyes: Negative for visual disturbance.  Respiratory: Negative for shortness of breath.   Cardiovascular: Positive for chest pain. Negative for palpitations.  Gastrointestinal: Positive for constipation (impacted). Negative for abdominal pain, diarrhea, nausea and vomiting.  Genitourinary: Positive for difficulty urinating, dysuria and frequency.  Musculoskeletal: Negative for arthralgias and myalgias.  Neurological: Positive for syncope. Negative for headaches.  Psychiatric/Behavioral: Negative for sleep disturbance.     Social History   Tobacco  Use  Smoking Status Former Smoker  . Packs/day: 1.00  . Years: 42.00  . Pack years: 42.00  . Types: Cigarettes  . Quit date: 06/01/2015  . Years since quitting: 3.8  Smokeless Tobacco Never Used  Tobacco Comment   would like to try chantix, but has not started on it due to warnings related to depression       Objective:     Wt Readings from Last 3 Encounters:  04/03/19 150 lb (68 kg)  02/06/19 156 lb (70.8 kg)  12/12/18 156 lb (70.8 kg)     Exam deferred. Pt. Harboring due to COVID 19. Phone visit performed.   Assessment & Plan:   1. Syncope and collapse   2. Acute cystitis without hematuria   3. Precordial pain     Meds ordered this encounter  Medications  . nitrofurantoin, macrocrystal-monohydrate, (MACROBID) 100 MG capsule    Sig: Take 1 capsule (100 mg total) by mouth 2 (two) times daily.    Dispense:  14 capsule    Refill:  0    Orders Placed This Encounter  Procedures  . Ambulatory referral to Cardiology    Referral Priority:   Routine    Referral Type:   Consultation    Referral Reason:   Specialty Services Required    Referred to Provider:   Minus Breeding, MD    Requested Specialty:  Cardiology    Number of Visits Requested:   1      Diagnoses and all orders for this visit:  Syncope and collapse -     Ambulatory referral to Cardiology  Acute cystitis without hematuria  Precordial pain  Other orders -     nitrofurantoin, macrocrystal-monohydrate, (MACROBID) 100 MG capsule; Take 1 capsule (100 mg total) by mouth 2 (two) times daily.    Virtual Visit via telephone Note  I discussed the limitations, risks, security and privacy concerns of performing an evaluation and management service by telephone and the availability of in person appointments. The patient was identified with two identifiers. Pt.expressed understanding and agreed to proceed. Pt. Is at home. Dr. Livia Snellen is in his office.  Follow Up Instructions:   I discussed the assessment  and treatment plan with the patient. The patient was provided an opportunity to ask questions and all were answered. The patient agreed with the plan and demonstrated an understanding of the instructions.   The patient was advised to call back or seek an in-person evaluation if the symptoms worsen or if the condition fails to improve as anticipated.   Total minutes including chart review and phone contact time: 25   Follow up plan: Return in about 2 weeks (around 04/30/2019), or if symptoms worsen or fail to improve.  [name], MD [place]

## 2019-04-17 ENCOUNTER — Encounter: Payer: Medicare Other | Attending: Physical Medicine & Rehabilitation | Admitting: Registered Nurse

## 2019-04-17 ENCOUNTER — Encounter: Payer: Self-pay | Admitting: Registered Nurse

## 2019-04-17 ENCOUNTER — Other Ambulatory Visit: Payer: Self-pay

## 2019-04-17 VITALS — BP 124/82 | HR 78 | Temp 98.1°F | Resp 16 | Ht 66.0 in | Wt 151.6 lb

## 2019-04-17 DIAGNOSIS — Z8249 Family history of ischemic heart disease and other diseases of the circulatory system: Secondary | ICD-10-CM | POA: Insufficient documentation

## 2019-04-17 DIAGNOSIS — M7502 Adhesive capsulitis of left shoulder: Secondary | ICD-10-CM | POA: Diagnosis not present

## 2019-04-17 DIAGNOSIS — G894 Chronic pain syndrome: Secondary | ICD-10-CM

## 2019-04-17 DIAGNOSIS — Z5181 Encounter for therapeutic drug level monitoring: Secondary | ICD-10-CM

## 2019-04-17 DIAGNOSIS — G4700 Insomnia, unspecified: Secondary | ICD-10-CM | POA: Insufficient documentation

## 2019-04-17 DIAGNOSIS — M25562 Pain in left knee: Secondary | ICD-10-CM | POA: Insufficient documentation

## 2019-04-17 DIAGNOSIS — M542 Cervicalgia: Secondary | ICD-10-CM | POA: Diagnosis not present

## 2019-04-17 DIAGNOSIS — E785 Hyperlipidemia, unspecified: Secondary | ICD-10-CM | POA: Diagnosis not present

## 2019-04-17 DIAGNOSIS — M47812 Spondylosis without myelopathy or radiculopathy, cervical region: Secondary | ICD-10-CM | POA: Insufficient documentation

## 2019-04-17 DIAGNOSIS — Z8042 Family history of malignant neoplasm of prostate: Secondary | ICD-10-CM | POA: Diagnosis not present

## 2019-04-17 DIAGNOSIS — Z79891 Long term (current) use of opiate analgesic: Secondary | ICD-10-CM

## 2019-04-17 DIAGNOSIS — Z801 Family history of malignant neoplasm of trachea, bronchus and lung: Secondary | ICD-10-CM | POA: Insufficient documentation

## 2019-04-17 DIAGNOSIS — F419 Anxiety disorder, unspecified: Secondary | ICD-10-CM | POA: Diagnosis not present

## 2019-04-17 DIAGNOSIS — Z85828 Personal history of other malignant neoplasm of skin: Secondary | ICD-10-CM | POA: Insufficient documentation

## 2019-04-17 DIAGNOSIS — Z90710 Acquired absence of both cervix and uterus: Secondary | ICD-10-CM | POA: Diagnosis not present

## 2019-04-17 DIAGNOSIS — M19012 Primary osteoarthritis, left shoulder: Secondary | ICD-10-CM | POA: Diagnosis not present

## 2019-04-17 DIAGNOSIS — K219 Gastro-esophageal reflux disease without esophagitis: Secondary | ICD-10-CM | POA: Insufficient documentation

## 2019-04-17 DIAGNOSIS — M5412 Radiculopathy, cervical region: Secondary | ICD-10-CM | POA: Diagnosis not present

## 2019-04-17 DIAGNOSIS — Z8371 Family history of colonic polyps: Secondary | ICD-10-CM | POA: Diagnosis not present

## 2019-04-17 DIAGNOSIS — F329 Major depressive disorder, single episode, unspecified: Secondary | ICD-10-CM | POA: Insufficient documentation

## 2019-04-17 DIAGNOSIS — M7631 Iliotibial band syndrome, right leg: Secondary | ICD-10-CM | POA: Diagnosis not present

## 2019-04-17 DIAGNOSIS — M25561 Pain in right knee: Secondary | ICD-10-CM | POA: Diagnosis not present

## 2019-04-17 DIAGNOSIS — Z841 Family history of disorders of kidney and ureter: Secondary | ICD-10-CM | POA: Diagnosis not present

## 2019-04-17 DIAGNOSIS — G8929 Other chronic pain: Secondary | ICD-10-CM | POA: Insufficient documentation

## 2019-04-17 DIAGNOSIS — J449 Chronic obstructive pulmonary disease, unspecified: Secondary | ICD-10-CM | POA: Diagnosis not present

## 2019-04-17 DIAGNOSIS — Z87891 Personal history of nicotine dependence: Secondary | ICD-10-CM | POA: Diagnosis not present

## 2019-04-17 DIAGNOSIS — Z825 Family history of asthma and other chronic lower respiratory diseases: Secondary | ICD-10-CM | POA: Insufficient documentation

## 2019-04-17 DIAGNOSIS — Z833 Family history of diabetes mellitus: Secondary | ICD-10-CM | POA: Insufficient documentation

## 2019-04-17 MED ORDER — HYDROCODONE-ACETAMINOPHEN 7.5-325 MG PO TABS: 1.0000 | ORAL_TABLET | Freq: Every day | ORAL | 0 refills | Status: DC | PRN

## 2019-04-17 NOTE — Progress Notes (Signed)
Subjective:    Patient ID: Cassandra [name], female    DOB: 16-Jan-1956, 63 y.o.   MRN: [id number]  HPI: Cassandra [name] is a 63 y.o. female who returns for follow up appointment for chronic pain and medication refill. She states her pain is located in her neck radiating into her left shoulder and upper back. Also reports lower back pain. She rates her pain 6. Her current exercise regime is walking.  [name] was seen in Emergency Room on 04/03/2019 for atypical chest pain, note was reviewed.   [name] Morphine equivalent is 7.5  MME.  Last UDS was Performed on 06/19/2018, it was consistent.   Pain Inventory Average Pain 7 Pain Right Now 6 My pain is constant, sharp, burning, dull, stabbing and aching  In the last 24 hours, has pain interfered with the following? General activity 7 Relation with others 7 Enjoyment of life 7 What TIME of day is your pain at its worst? morning Sleep (in general) Poor  Pain is worse with: walking, bending, sitting and some activites Pain improves with: heat/ice and medication Relief from Meds: 5  Mobility walk without assistance ability to climb steps?  yes do you drive?  yes Do you have any goals in this area?  yes  Function disabled: date disabled na  Neuro/Psych bladder control problems bowel control problems weakness tremor tingling spasms dizziness  Prior Studies Any changes since last visit?  no  Physicians involved in your care Any changes since last visit?  no   Family History  Problem Relation Age of Onset  . Coronary artery disease Mother   . Diabetes Mother        borderline  . Kidney disease Mother        stage 4  . Coronary artery disease Father   . Prostate cancer Father   . Lung disease Father        black lung  . Liver cancer Maternal Grandmother   . Liver disease Maternal Grandmother   . Polymyalgia rheumatica Sister   . Heart disease Sister   . Coronary artery disease Brother        heart attack  . COPD  Brother   . Colon polyps Sister   . Lung cancer Paternal Grandmother   . Colon cancer Other        Both sides   Social History   Socioeconomic History  . Marital status: Married    Spouse name: Not on file  . Number of children: 1  . Years of education: Not on file  . Highest education level: GED or equivalent  Occupational History  . Occupation: Agricultural engineer  Social Needs  . Financial resource strain: Not hard at all  . Food insecurity    Worry: Never true    Inability: Never true  . Transportation needs    Medical: No    Non-medical: No  Tobacco Use  . Smoking status: Former Smoker    Packs/day: 1.00    Years: 42.00    Pack years: 42.00    Types: Cigarettes    Quit date: 06/01/2015    Years since quitting: 3.8  . Smokeless tobacco: Never Used  . Tobacco comment: would like to try chantix, but has not started on it due to warnings related to depression  Substance and Sexual Activity  . Alcohol use: No    Alcohol/week: 0.0 standard drinks  . Drug use: No  . Sexual activity: Yes  Lifestyle  . Physical  activity    Days per week: 3 days    Minutes per session: 30 min  . Stress: Not at all  Relationships  . Social connections    Talks on phone: More than three times a week    Gets together: More than three times a week    Attends religious service: 1 to 4 times per year    Active member of club or organization: No    Attends meetings of clubs or organizations: Never    Relationship status: Married  Other Topics Concern  . Not on file  Social History Narrative  . Not on file   Past Surgical History:  Procedure Laterality Date  . ABDOMINAL HYSTERECTOMY     age 50  . ANAL RECTAL MANOMETRY N/A 12/09/2015   Procedure: ANO RECTAL MANOMETRY;  Surgeon: Mauri Pole, MD;  Location: [facility];  Service: Endoscopy;  Laterality: N/A;  . BLADDER REPAIR     sling/with perforation  . CESAREAN SECTION     1 time  . KNEE ARTHROSCOPY Left   . ruptured uterus    .  SHOULDER ARTHROSCOPY     multiple   Past Medical History:  Diagnosis Date  . Anxiety   . Bowel habit changes    excessive gas /frequent BM's with change in shape with pus  . Chronic left shoulder pain    frozen   . Chronic pain    left upper shoulder ,neck and back  . COPD (chronic obstructive pulmonary disease) ([employer] [place])    Dr. Redge Gainer  . Depression   . GERD (gastroesophageal reflux disease)   . Hyperlipidemia   . Maternal complication related to childbirth    Sepsis, staph  . Meniere's disease   . Nicotine addiction    2 packs per day   . Rapid or irregular heartbeat   . Skin cancer    Rt. leg, squamous cell  . Tubular adenoma of colon 01/31/2000   Dr. Richmond Campbell  . Weakness of pelvic floor    prolapse -  MRI from 2017   There were no vitals taken for this visit.  Opioid Risk Score:   Fall Risk Score:  `1  Depression screen PHQ 2/9  Depression screen [facility] 2/9 12/12/2018 11/26/2018 10/04/2018 06/20/2018 06/19/2018 02/27/2018 12/25/2017  Decreased Interest 1 2 1  0 1 1 0  Down, Depressed, Hopeless 1 1 1  0 1 1 0  PHQ - 2 Score 2 3 2  0 2 2 0  Altered sleeping - 3 2 - - - 0  Tired, decreased energy - 3 3 - - - 0  Change in appetite - 3 3 - - - 0  Feeling bad or failure about yourself  - 0 0 - - - 0  Trouble concentrating - 1 1 - - - 0  Moving slowly or fidgety/restless - 0 0 - - - 0  Suicidal thoughts - 0 0 - - - 0  PHQ-9 Score - 13 11 - - - 0  Some recent data might be hidden     Review of Systems  Constitutional: Negative.   HENT: Negative.   Eyes: Negative.   Respiratory: Positive for shortness of breath.   Cardiovascular: Negative.   Gastrointestinal: Positive for abdominal pain.  Endocrine: Negative.   Genitourinary: Positive for difficulty urinating, dysuria and flank pain.  Musculoskeletal: Positive for myalgias.  Skin: Negative.   Allergic/Immunologic: Negative.   Neurological: Positive for dizziness, tremors, weakness and numbness.  Hematological:  Negative.  Psychiatric/Behavioral: Negative.   All other systems reviewed and are negative.      Objective:   Physical Exam Vitals signs and nursing note reviewed.  Constitutional:      Appearance: Normal appearance.  Neck:     Musculoskeletal: Normal range of motion and neck supple.     Comments: Cervical Paraspinal Tenderness: C-5-C-6 Cardiovascular:     Rate and Rhythm: Normal rate and regular rhythm.     Pulses: Normal pulses.     Heart sounds: Normal heart sounds.  Pulmonary:     Effort: Pulmonary effort is normal.     Breath sounds: Normal breath sounds.  Musculoskeletal:     Comments: Normal Muscle Bulk and Muscle Testing Reveals:  Upper Extremities: Right: Full ROM and Muscle Strength 5/5 Left: Decreased ROM 35 Degrees and Muscle Strength 4/5 Left AC Joint Tenderness  Thoracic Hypersensitivity: T-1- T-7  Lumbar Paraspinal Tenderness: L-4-L-5 Lower Extremities: Full ROM and Muscle Strength 5/5 Arises from chair with ease Narrow Based Gait   Skin:    General: Skin is warm and dry.  Neurological:     Mental Status: She is alert and oriented to person, place, and time.  Psychiatric:        Mood and Affect: Mood normal.        Behavior: Behavior normal.           Assessment & Plan:  1.Post-traumatic arthritis left shoulder s/p 2 shoulder surgeries. Now with adhesive capsulitis left shoulder.04/17/2019. Refilled:Hydrocodone7.5/325mg  one tablet dailyas needed. #30.  We will continue the opioid monitoring program, this consists of regular clinic visits, examinations, urine drug screen, pill counts as well as use of [place] Controlled Substance Reporting System. 2. Cervicalgia,Cervicalspondylosis/ Cervical Radiculitis: Continue Current Medication Regime.Robaxinand Gabapentin.04/17/2019. 3. Depression/insomnia/anxiety: Continue Lexapro.Dr. Livia Snellen following. 04/17/2019 4. Myofascial Pain: Continue Robaxin. Continue to monitor. 04/17/2019 5. Chronic Pain  of Knees: Continue to Monitor. 08/05/202020 6. Chronic Bilateral Thoracic Back Pain: Continue HEP as Tolerated. Continue to Alternate Ice and Heat Therapy. Continue current medication regimen.   103minutes of face to face patient care time was spent during this visit. All questions were encouraged and answered.   F/U in 2 months

## 2019-04-22 ENCOUNTER — Ambulatory Visit (INDEPENDENT_AMBULATORY_CARE_PROVIDER_SITE_OTHER): Payer: Medicare Other | Admitting: *Deleted

## 2019-04-22 DIAGNOSIS — F3289 Other specified depressive episodes: Secondary | ICD-10-CM

## 2019-04-22 DIAGNOSIS — G8929 Other chronic pain: Secondary | ICD-10-CM | POA: Insufficient documentation

## 2019-04-22 DIAGNOSIS — H8109 Meniere's disease, unspecified ear: Secondary | ICD-10-CM | POA: Insufficient documentation

## 2019-04-22 DIAGNOSIS — N8189 Other female genital prolapse: Secondary | ICD-10-CM | POA: Insufficient documentation

## 2019-04-22 DIAGNOSIS — F411 Generalized anxiety disorder: Secondary | ICD-10-CM

## 2019-04-22 DIAGNOSIS — G8928 Other chronic postprocedural pain: Secondary | ICD-10-CM

## 2019-04-22 NOTE — Chronic Care Management (AMB) (Signed)
Chronic Care Management   Initial Visit Note  04/22/2019 Name: MEYER DOCKERY MRN: [id number] DOB: 07/21/1956  Referred by: [name], MD Reason for referral : Chronic Care Management (RNCM Initial Outreach  regarding [facility] referral s/p ED visit. Referral scanned under media. )   ZARYAH SECKEL is a 63 y.o. year old female who is a primary care patient of Stacks, Cletus Gash, MD. The CCM team was consulted for assistance with chronic disease management and care coordination needs.   Review of patient status, including review of consultants reports, relevant laboratory and other test results, and collaboration with appropriate care team members and the patient's provider was performed as part of comprehensive patient evaluation and provision of chronic care management services.    SDOH (Social Determinants of Health) screening performed today. See Care Plan Entry related to challenges with: None  Subjective: "Right now I feel like I'm where I'm supposed to be with my health. Things are much better than they were two weeks ago".   I spoke with Ms Esters by telephone today. She had an ED visit on 04/03/19 for atypical chest pain and is waiting for an appointment with Dr Percival Spanish. Verified referral is pending. She was diagnosed with a UTI. In the past she was on a daily antibiotic prophylactically but that was discontinued about 5 years ago. She has a bladder sling that perforated her vaginal walls years ago and she has episodes of constipation. Both of which can increase risk for UTIs. Overall she feels that she is managing well but she would like to be followed by the CCM team.    Objective:  Lab Results  Component Value Date   WBC 6.6 04/03/2019   HGB 14.0 04/03/2019   HCT 43.3 04/03/2019   MCV 92.1 04/03/2019   PLT 319 04/03/2019   Urine culture on 04/03/19 grew >100,000 colonies of Ecoli and was not susceptible to Cipro. She was switched to [place].  Assessment: Goals Addressed            This Visit's Progress     Patient Stated   . "I want to prevent UTIs" (pt-stated)       Current Barriers:  . Chronic Disease Management support and education needs related to recurrent UTIs  Nurse Case Manager Clinical Goal(s):  Marland Kitchen Over the next 30 days, patient will verbalize understanding of plan for UTI prevention  Interventions:  . Evaluation of current treatment plan related to recurrent UTIs and patient's adherence to plan as established by provider. . Advised patient to drink water throughout the day, empty bladder every 2 to 3 hours, and prevent constipation (water, fiber, stool softener) . Cause likely anatomical structure in combination with frequent constipation and complications of bladder sling positioned incorrectly (per patient's report) . Reviewed medications with patient and discussed prior use of daily antibiotic, Macrobid, to prevent recurrent UTIs. She was taken off of about 5 years ago by her then PCP.  Marland Kitchen Will collaborate with PCP regarding restarting daily trimethoprim or macrobid for prevention  Patient Self Care Activities:  . Performs ADL's independently . Performs IADL's independently  Initial goal documentation         Plan:  RNCM will consult with Dr Livia Snellen regarding patient's request for daily prophylactic antibiotic RNCM will f/u with patient over the next 30 days Patient provided contact information  Chong Sicilian BSN, RN-BC [place] [facility] [place] [facility] Direct Dial: [phone]

## 2019-04-22 NOTE — Patient Instructions (Signed)
Visit Information  Goals Addressed            This Visit's Progress     Patient Stated   . "I want to prevent UTIs" (pt-stated)       Current Barriers:  . Chronic Disease Management support and education needs related to recurrent UTIs  Nurse Case Manager Clinical Goal(s):  Marland Kitchen Over the next 30 days, patient will verbalize understanding of plan for UTI prevention  Interventions:  . Evaluation of current treatment plan related to recurrent UTIs and patient's adherence to plan as established by provider. . Advised patient to drink water throughout the day, empty bladder every 2 to 3 hours, and prevent constipation (water, fiber, stool softener) . Cause likely anatomical structure in combination with frequent constipation and complications of bladder sling positioned incorrectly (per patient's report) . Reviewed medications with patient and discussed prior use of daily antibiotic, Macrobid, to prevent recurrent UTIs. She was taken off of about 5 years ago by her then PCP.  Marland Kitchen Will collaborate with PCP regarding restarting daily trimethoprim or macrobid for prevention  Patient Self Care Activities:  . Performs ADL's independently . Performs IADL's independently  Initial goal documentation        The patient verbalized understanding of instructions provided today and declined a print copy of patient instruction materials.   Ms. Mccomb was given information about Chronic Care Management services today including:  1. CCM service includes personalized support from designated clinical staff supervised by her physician, including individualized plan of care and coordination with other care providers 2. 24/7 contact phone numbers for assistance for urgent and routine care needs. 3. Service will only be billed when office clinical staff spend 20 minutes or more in a month to coordinate care. 4. Only one practitioner may furnish and bill the service in a calendar month. 5. The patient may stop  CCM services at any time (effective at the end of the month) by phone call to the office staff. 6. The patient will be responsible for cost sharing (co-pay) of up to 20% of the service fee (after annual deductible is met).  Patient agreed to services and verbal consent obtained.     Plan:  RNCM will consult with Dr Livia Snellen regarding patient's request for daily prophylactic antibiotic RNCM will f/u with patient over the next 30 days Patient provided contact information  Chong Sicilian BSN, RN-BC [place] / [place] Management Direct Dial: [phone]

## 2019-04-26 ENCOUNTER — Ambulatory Visit: Payer: Medicare Other | Admitting: *Deleted

## 2019-04-26 DIAGNOSIS — G8928 Other chronic postprocedural pain: Secondary | ICD-10-CM

## 2019-04-26 DIAGNOSIS — N8189 Other female genital prolapse: Secondary | ICD-10-CM

## 2019-04-26 NOTE — Chronic Care Management (AMB) (Signed)
  Chronic Care Management   Care Coordination Note  04/26/2019 Name: Cassandra [name] MRN: [id number] DOB: [birth year]  I reached out to patient by telephone today to talk with her about Dr Livia Snellen' recommendations for treating recurrent UTIs but I was unable to talk with her.   Goals Addressed            This Visit's Progress     Patient Stated   . "I want to prevent UTIs" (pt-stated)       Current Barriers:  . Chronic Disease Management support and education needs related to recurrent UTIs  Nurse Case Manager Clinical Goal(s):  Marland Kitchen Over the next 30 days, patient will verbalize understanding of plan for UTI prevention  Interventions:  . Consulted with PCP, Dr Livia Snellen and he recommended holding off on prophylaxis for now because she's only had two documented infections in the past year. Stated that he generally prescribes prophylaxis for 4+ infections in one year.   Patient Self Care Activities:  . Performs ADL's independently . Performs IADL's independently  Initial goal documentation          Follow up plan: A HIPPA compliant phone message was left for the patient providing contact information and requesting a return call.  The care management team will reach out to the patient again over the next 14 days. Advised that my call is not urgent and that I will be out of the office for one week after close of business today.   Chong Sicilian BSN, RN-BC Embedded Chronic Care Manager [facility] [place] [facility] Direct Dial: [phone]

## 2019-04-30 ENCOUNTER — Ambulatory Visit: Payer: Self-pay | Admitting: *Deleted

## 2019-04-30 DIAGNOSIS — R5383 Other fatigue: Secondary | ICD-10-CM

## 2019-04-30 DIAGNOSIS — R8290 Unspecified abnormal findings in urine: Secondary | ICD-10-CM

## 2019-04-30 DIAGNOSIS — R55 Syncope and collapse: Secondary | ICD-10-CM

## 2019-05-01 NOTE — Chronic Care Management (AMB) (Signed)
Chronic Care Management   Follow Up Note   05/01/2019 Name: Cassandra [name] MRN: [id number] DOB: [birth year]  Referred by: [name], MD Reason for referral : Chronic Care Management (UTI, CP follow up)   Cassandra [name] is a 63 y.o. year old female who is a primary care patient of Stacks, Cletus Gash, MD. The CCM team was consulted for assistance with chronic disease management and care coordination needs.    Review of patient status, including review of consultants reports, relevant laboratory and other test results, and collaboration with appropriate care team members and the patient's provider was performed as part of comprehensive patient evaluation and provision of chronic care management services.    SDOH (Social Determinants of Health) screening performed today: Physical Activity. See Care Plan for related entries.   BP Readings from Last 3 Encounters:  04/17/19 124/82  04/03/19 130/71  12/12/18 118/78   Lab Results  Component Value Date   WBC 6.6 04/03/2019   HGB 14.0 04/03/2019   HCT 43.3 04/03/2019   PLT 319 04/03/2019   GLUCOSE 120 (H) 04/03/2019   CHOL 244 (H) 06/20/2018   TRIG 86 06/20/2018   HDL 53 06/20/2018   LDLCALC 174 (H) 06/20/2018   ALT 11 04/03/2019   AST 16 04/03/2019   NA 140 04/03/2019   K 3.9 04/03/2019   CL 105 04/03/2019   CREATININE 1.01 (H) 04/03/2019   BUN 14 04/03/2019   CO2 24 04/03/2019   TSH 1.410 11/26/2018   Outpatient Encounter Medications as of 04/30/2019  Medication Sig Note  . chlorhexidine (HIBICLENS) 4 % external liquid Shower daily for 5 days. Apply all over, including scalp and leave on  5 min before rinsing. (Patient taking differently: Apply 1 application topically See admin instructions. Shower daily for 5 days. Apply all over, including scalp and leave on  5 min before rinsing.) 04/03/2019: Pt uses PRN  . cholecalciferol (VITAMIN D) 1000 units tablet Take 1,000 Units by mouth daily.   Marland Kitchen conjugated estrogens (PREMARIN) vaginal  cream Place vaginally daily. (Patient taking differently: Place 1 Applicatorful vaginally daily. )   . diclofenac (VOLTAREN) 75 MG EC tablet Take 1 tablet (75 mg total) by mouth 2 (two) times daily with a meal. (Patient not taking: Reported on 04/22/2019)   . fexofenadine (ALLEGRA) 180 MG tablet Take 1 tablet (180 mg total) by mouth daily as needed for allergies.   Marland Kitchen gabapentin (NEURONTIN) 300 MG capsule Take 300 mg by mouth 2 (two) times daily.   Marland Kitchen HYDROcodone-acetaminophen (NORCO) 7.5-325 MG tablet Take 1 tablet by mouth daily as needed for moderate pain.   . hydroxypropyl methylcellulose / hypromellose (ISOPTO TEARS / GONIOVISC) 2.5 % ophthalmic solution Place 1 drop into both eyes as needed for dry eyes.   . meclizine (ANTIVERT) 25 MG tablet TAKE 1 TABLET BY MOUTH TWO  TIMES DAILY AS NEEDED (Patient taking differently: Take 25 mg by mouth 2 (two) times daily as needed for dizziness or nausea. )   . methocarbamol (ROBAXIN) 500 MG tablet TAKE 1 TABLET BY MOUTH TWICE A DAY (Patient taking differently: Take 500 mg by mouth 2 (two) times a day. )   . mupirocin ointment (BACTROBAN) 2 % Place 1 application into the nose 2 (two) times daily.   . nitrofurantoin, macrocrystal-monohydrate, (MACROBID) 100 MG capsule Take 1 capsule (100 mg total) by mouth 2 (two) times daily. (Patient not taking: Reported on 04/22/2019)   . ondansetron (ZOFRAN-ODT) 8 MG disintegrating tablet Take 1 tablet (8 mg  total) by mouth every 6 (six) hours as needed for nausea or vomiting.    No facility-administered encounter medications on file as of 04/30/2019.      Goals Addressed            This Visit's Progress   . "I want some help figuring out about this chest pain" (pt-stated)       Current Barriers:  Marland Kitchen Knowledge Deficits related to symptoms and symptom management: patient reports intermittent "shooting" chest pain and intermittent "band underneath my breast". Reports at least one episode of "almost passing out in the  grocery store" and ongoing fatigue over the last approximately 2 months; reports mild to moderate swelling of the legs/feet with exercise and/or when temperatures outside are high; reports one episode of "severe" swelling from toes to mid-thigh which resolved without intervention  Nurse Case Manager Clinical Goal(s):  Marland Kitchen Over the next 60 days, patient will verbalize understanding of plan for management of chest discomfort/symptoms . Over the next 30 days, patient will attend all scheduled medical appointments: Cardiology Vita Barley MD Jun 21, 2019 9:40am  Interventions:  . Evaluation of current treatment plan related to chest discomfort and possible cardiovascular symptoms and patient's adherence to plan as established by provider. . Advised patient to call 911 for chest discomfort/pain, lightheadedness/dizziness or new/worsened symptoms that do not resolve with rest or measures as advised by providers thus far; advised to call PCP office for questions/concerns about symptom manageent . Provided education to patient re: signs and symptoms of heart attack, stroke . Reviewed medications with patient and discussed use of baby asa for chest pain - patient reports she has taken baby asa on occasion when she experiences chest pain or the "band around my chest" . Reviewed scheduled/upcoming provider appointments including: Cardiology, Marijo File MD Jun 21, 2019 9:40am  Patient Self Care Activities:  . Currently UNABLE TO independently self manage current new symptoms . Self administers medications as prescribed . Attends all scheduled provider appointments . Performs ADL's independently . Performs IADL's independently . Calls provider office for new concerns or questions  Initial goal documentation     . "I want to prevent UTIs" (pt-stated)       Current Barriers:  . Chronic Disease Management support and education needs related to recurrent UTIs . Patient reports she has 3 days remaining on  course of oral abx prescribed by PCP and is taking daily as prescribed  Nurse Case Manager Clinical Goal(s):  Marland Kitchen Over the next 30 days, patient will verbalize understanding of plan for UTI prevention  Interventions:  . Reviewed report of UTI symptoms 04/30/19: discomfort resolved; patient reports urine still appears "mildly cloudy" . Reviewed results of RNCM consult with PCP, Dr Livia Snellen who recommended holding off on prophylaxis for now because she's only had two documented infections in the past year. Stated that he generally prescribes prophylaxis for 4+ infections in one year.  . Advised patient to continue self care interventions as outlined by primary care manager . Addressed patient question regarding whether "IV Antibiotics" is an appropriate treatment option to "knock it out for good". Advised that provider would determine next most appropriate course of treatment based on results of completion of current course of abx.   Patient Self Care Activities:  . Self administers medications as prescribed . Attends all scheduled provider appointments . Performs ADL's independently . Performs IADL's independently . Calls provider office for new concerns or questions  Please see past updates related to this goal by  clicking on the "Past Updates" button in the selected goal          Telephone follow up appointment with care management team member scheduled for: 05/06/19 1pm   [place] Nurse Care Coordinator [facility] Care Management  [phone]

## 2019-05-01 NOTE — Patient Instructions (Signed)
Visit Information  Goals Addressed            This Visit's Progress   . "I want some help figuring out about this chest pain" (pt-stated)       Current Barriers:  Marland Kitchen Knowledge Deficits related to symptoms and symptom management: patient reports intermittent "shooting" chest pain and intermittent "band underneath my breast". Reports at least one episode of "almost passing out in the grocery store" and ongoing fatigue over the last approximately 2 months; reports mild to moderate swelling of the legs/feet with exercise and/or when temperatures outside are high; reports one episode of "severe" swelling from toes to mid-thigh which resolved without intervention  Nurse Case Manager Clinical Goal(s):  Marland Kitchen Over the next 60 days, patient will verbalize understanding of plan for management of chest discomfort/symptoms . Over the next 30 days, patient will attend all scheduled medical appointments: Cardiology Vita Barley MD Jun 21, 2019 9:40am  Interventions:  . Evaluation of current treatment plan related to chest discomfort and possible cardiovascular symptoms and patient's adherence to plan as established by provider. . Advised patient to call 911 for chest discomfort/pain, lightheadedness/dizziness or new/worsened symptoms that do not resolve with rest or measures as advised by providers thus far; advised to call PCP office for questions/concerns about symptom manageent . Provided education to patient re: signs and symptoms of heart attack, stroke . Reviewed medications with patient and discussed use of baby asa for chest pain - patient reports she has taken baby asa on occasion when she experiences chest pain or the "band around my chest" . Reviewed scheduled/upcoming provider appointments including: Cardiology, Marijo File MD Jun 21, 2019 9:40am  Patient Self Care Activities:  . Currently UNABLE TO independently self manage current new symptoms . Self administers medications as prescribed . Attends  all scheduled provider appointments . Performs ADL's independently . Performs IADL's independently . Calls provider office for new concerns or questions  Initial goal documentation     . "I want to prevent UTIs" (pt-stated)       Current Barriers:  . Chronic Disease Management support and education needs related to recurrent UTIs . Patient reports she has 3 days remaining on course of oral abx prescribed by PCP and is taking daily as prescribed  Nurse Case Manager Clinical Goal(s):  Marland Kitchen Over the next 30 days, patient will verbalize understanding of plan for UTI prevention  Interventions:  . Reviewed report of UTI symptoms 04/30/19: discomfort resolved; patient reports urine still appears "mildly cloudy" . Reviewed results of RNCM consult with PCP, Dr Livia Snellen who recommended holding off on prophylaxis for now because she's only had two documented infections in the past year. Stated that he generally prescribes prophylaxis for 4+ infections in one year.  . Advised patient to continue self care interventions as outlined by primary care manager . Addressed patient question regarding whether "IV Antibiotics" is an appropriate treatment option to "knock it out for good". Advised that provider would determine next most appropriate course of treatment based on results of completion of current course of abx.   Patient Self Care Activities:  . Self administers medications as prescribed . Attends all scheduled provider appointments . Performs ADL's independently . Performs IADL's independently . Calls provider office for new concerns or questions  Please see past updates related to this goal by clicking on the "Past Updates" button in the selected goal         The patient verbalized understanding of instructions provided today and declined  a print copy of patient instruction materials.   Telephone follow up appointment with care management team member scheduled for: 05/06/19 1pm  Janalyn Shy  [facility] Nurse Care Coordinator [facility] Care Management [phone]

## 2019-05-06 ENCOUNTER — Ambulatory Visit: Payer: Medicare Other | Admitting: *Deleted

## 2019-05-06 DIAGNOSIS — N8189 Other female genital prolapse: Secondary | ICD-10-CM

## 2019-05-06 DIAGNOSIS — N3941 Urge incontinence: Secondary | ICD-10-CM

## 2019-05-06 DIAGNOSIS — G8928 Other chronic postprocedural pain: Secondary | ICD-10-CM

## 2019-05-06 DIAGNOSIS — I700 Atherosclerosis of aorta: Secondary | ICD-10-CM

## 2019-05-06 NOTE — Patient Instructions (Signed)
  Follow Up Plan: A HIPPA compliant phone message was left for the patient providing contact information and requesting a return call.  The care management team will reach out to the patient again over the next 2 days.   Chong Sicilian BSN, RN-BC Embedded Chronic Care Manager [facility] [place] Management Direct Dial: [phone]

## 2019-05-06 NOTE — Chronic Care Management (AMB) (Signed)
  Chronic Care Management   Follow-up Note  05/06/2019 Name: Cassandra [name] MRN: [id number] DOB: [birth year]  Referred by: [name], MD Reason for referral : Chronic Care Management (RNCM follow up)   An unsuccessful telephone follow-up was attempted today. The patient was referred to the case management team by for assistance with chronic care management and care coordination. Chart reviewed in preparation for follow-up call. Patient has an appointment scheduled with Dr Percival Spanish in October to follow-up chest pains and she was provided information on self management s/s of heart attack during her CCM call on 04/30/2019. UTI management was also discussed.   Follow Up Plan: A HIPPA compliant phone message was left for the patient providing contact information and requesting a return call.  The care management team will reach out to the patient again over the next 2 days.   Chong Sicilian BSN, RN-BC Embedded Chronic Care Manager [facility] [place] Management Direct Dial: [phone]

## 2019-05-07 ENCOUNTER — Other Ambulatory Visit: Payer: Self-pay | Admitting: Family Medicine

## 2019-05-07 DIAGNOSIS — N390 Urinary tract infection, site not specified: Secondary | ICD-10-CM

## 2019-05-08 ENCOUNTER — Telehealth: Payer: Medicare Other

## 2019-05-09 ENCOUNTER — Other Ambulatory Visit: Payer: Self-pay

## 2019-05-09 ENCOUNTER — Other Ambulatory Visit: Payer: Medicare Other

## 2019-05-09 DIAGNOSIS — N390 Urinary tract infection, site not specified: Secondary | ICD-10-CM | POA: Diagnosis not present

## 2019-05-09 LAB — URINALYSIS, COMPLETE
Bilirubin, UA: NEGATIVE
Glucose, UA: NEGATIVE
Ketones, UA: NEGATIVE
Leukocytes,UA: NEGATIVE
Nitrite, UA: NEGATIVE
Protein,UA: NEGATIVE
RBC, UA: NEGATIVE
Specific Gravity, UA: 1.02 (ref 1.005–1.030)
Urobilinogen, Ur: 0.2 mg/dL (ref 0.2–1.0)
pH, UA: 7.5 (ref 5.0–7.5)

## 2019-05-09 LAB — MICROSCOPIC EXAMINATION
Epithelial Cells (non renal): >10 /hpf — AB (ref 0–10)
Renal Epithel, UA: NONE SEEN /hpf

## 2019-05-09 NOTE — Progress Notes (Signed)
Cardiology Office Note   Date:  05/10/2019   ID:  Cassandra [name], DOB [birth year], MRN [id number]  PCP:  Cassandra Fraise, MD  Cardiologist:   Minus Breeding, MD Referring:  Cassandra Fraise, MD  Chief Complaint  Patient presents with  . Loss of Consciousness      History of Present Illness: Cassandra [name] is a 63 y.o. female who is referred by Cassandra Fraise, MD for evaluation of syncope and orthostatic hypotension.  The patient has no past cardiac history.  However, she does have cardiovascular risk factors.  She is not felt well for several weeks.  She has had decreased exercise tolerance.  She is even had trouble doing things like taking a shower and she had to sit in the shower.  She has been battling apparently recurrent UTI.  She had some sharp discomfort under her left breast recently.  She actually presented to the emergency room the day after this and I did review this.  She had no acute EKG findings.  Enzymes were negative.  She was found to have a urinary infection at that time.  She reports also that she has had some orthostatic symptoms.  She feels dizzy when she stands up.  She had an episode of frank syncope when she was walking through the store with her sister.  She felt everything go away.  Her sister had decreased her down.  She probably lost consciousness but briefly.  She did not have trauma.  She is not really describing palpitations, presyncope or syncope.  She is not having any substernal chest pressure, neck or arm discomfort.  Said no weight gain or edema.  She has had no new PND or orthopnea.  She does have some dyspnea with exertion.  Past Medical History:  Diagnosis Date  . Anxiety   . Bowel habit changes    excessive gas /frequent BM's with change in shape with pus  . Chronic left shoulder pain    frozen   . Chronic pain    left upper shoulder ,neck and back  . COPD (chronic obstructive pulmonary disease) ([place])    Dr. Redge Gainer  . Depression   . GERD  (gastroesophageal reflux disease)   . Hyperlipidemia   . Maternal complication related to childbirth    Sepsis, staph  . Meniere's disease   . Nicotine addiction    2 packs per day   . Skin cancer    Rt. leg, squamous cell  . Tubular adenoma of colon 01/31/2000   Dr. Richmond Campbell  . Weakness of pelvic floor    prolapse -  MRI from 2017    Past Surgical History:  Procedure Laterality Date  . ABDOMINAL HYSTERECTOMY     age 61  . ANAL RECTAL MANOMETRY N/A 12/09/2015   Procedure: ANO RECTAL MANOMETRY;  Surgeon: Mauri Pole, MD;  Location: [facility] ENDOSCOPY;  Service: Endoscopy;  Laterality: N/A;  . BLADDER REPAIR     sling/with perforation  . CESAREAN SECTION     1 time  . KNEE ARTHROSCOPY Left   . ruptured uterus    . SHOULDER ARTHROSCOPY     multiple     Current Outpatient Medications  Medication Sig Dispense Refill  . chlorhexidine (HIBICLENS) 4 % external liquid Shower daily for 5 days. Apply all over, including scalp and leave on  5 min before rinsing. (Patient taking differently: Apply 1 application topically See admin instructions. Shower daily for 5 days. Apply all over, including  scalp and leave on  5 min before rinsing.) 236 mL 1  . cholecalciferol (VITAMIN D) 1000 units tablet Take 1,000 Units by mouth daily.    Marland Kitchen conjugated estrogens (PREMARIN) vaginal cream Place vaginally daily. (Patient taking differently: Place 1 Applicatorful vaginally daily as needed. ) 42.5 g 5  . fexofenadine (ALLEGRA) 180 MG tablet Take 1 tablet (180 mg total) by mouth daily as needed for allergies. 90 tablet 3  . gabapentin (NEURONTIN) 300 MG capsule Take 300 mg by mouth 2 (two) times daily.    Marland Kitchen HYDROcodone-acetaminophen (NORCO) 7.5-325 MG tablet Take 1 tablet by mouth daily as needed for moderate pain. 30 tablet 0  . hydroxypropyl methylcellulose / hypromellose (ISOPTO TEARS / GONIOVISC) 2.5 % ophthalmic solution Place 1 drop into both eyes as needed for dry eyes.    . meclizine  (ANTIVERT) 25 MG tablet TAKE 1 TABLET BY MOUTH TWO  TIMES DAILY AS NEEDED (Patient taking differently: Take 25 mg by mouth 2 (two) times daily as needed for dizziness or nausea. ) 60 tablet 2  . methocarbamol (ROBAXIN) 500 MG tablet TAKE 1 TABLET BY MOUTH TWICE A DAY (Patient taking differently: Take 500 mg by mouth 2 (two) times a day. ) 60 tablet 5  . nitrofurantoin, macrocrystal-monohydrate, (MACROBID) 100 MG capsule Take 1 capsule (100 mg total) by mouth 2 (two) times daily. 14 capsule 0  . ondansetron (ZOFRAN-ODT) 8 MG disintegrating tablet Take 1 tablet (8 mg total) by mouth every 6 (six) hours as needed for nausea or vomiting. 30 tablet 6   No current facility-administered medications for this visit.     Allergies:   Chantix [varenicline], Codeine, Cymbalta [duloxetine hcl], Livalo [pitavastatin], Metoprolol, Morphine and related, Oxycodone-aspirin, Oxycontin [oxycodone hcl], Propoxyphene n-acetaminophen, Remeron [mirtazapine], Trazodone and nefazodone, Zithromax [azithromycin], Other, Oxycodone-acetaminophen, and Septra [sulfamethoxazole-trimethoprim]    Social History:  The patient  reports that she quit smoking about 3 years ago. Her smoking use included cigarettes. She has a 42.00 pack-year smoking history. She has never used smokeless tobacco. She reports that she does not drink alcohol or use drugs.   Family History:  The patient's family history includes COPD in her brother; Colon cancer in an other family member; Colon polyps in her sister; Coronary artery disease in her brother, father, and mother; Diabetes in her mother; Heart disease in her sister; Kidney disease in her mother; Liver cancer in her maternal grandmother; Liver disease in her maternal grandmother; Lung cancer in her paternal grandmother; Lung disease in her father; Polymyalgia rheumatica in her sister; Prostate cancer in her father.    ROS:  Please see the history of present illness.   Otherwise, review of systems are  positive for none.   All other systems are reviewed and negative.    PHYSICAL EXAM: VS:  Temp (!) 97 F (36.1 C)   Ht 5\' 6"  (1.676 m)   Wt 149 lb 6.4 oz (67.8 kg)   BMI 24.11 kg/m  , BMI Body mass index is 24.11 kg/m. GENERAL:  Well appearing HEENT:  Pupils equal round and reactive, fundi not visualized, oral mucosa unremarkable NECK:  No jugular venous distention, waveform within normal limits, carotid upstroke brisk and symmetric, right bruits, no thyromegaly LYMPHATICS:  No cervical, inguinal adenopathy LUNGS:  Clear to auscultation bilaterally BACK:  No CVA tenderness CHEST:  Unremarkable HEART:  PMI not displaced or sustained,S1 and S2 within normal limits, no S3, no S4, no clicks, no rubs, no murmurs ABD:  Flat, positive bowel sounds  normal in frequency in pitch, no bruits, no rebound, no guarding, no midline pulsatile mass, no hepatomegaly, no splenomegaly EXT:  2 plus pulses throughout, no edema, no cyanosis no clubbing SKIN:  No rashes no nodules NEURO:  Cranial nerves II through XII grossly intact, motor grossly intact throughout PSYCH:  Cognitively intact, oriented to person place and time    EKG:  EKG is ordered today. The ekg ordered today demonstrates sinus rhythm, rate 96, axis within normal limits, intervals within normal limits, RSR prime V1 and V2, no acute ST-T wave changes.   Recent Labs: 11/26/2018: TSH 1.410 04/03/2019: ALT 11; BUN 14; Creatinine, Ser 1.01; Hemoglobin 14.0; Platelets 319; Potassium 3.9; Sodium 140    Lipid Panel    Component Value Date/Time   CHOL 244 (H) 06/20/2018 1307   TRIG 86 06/20/2018 1307   TRIG 71 12/10/2013 1702   HDL 53 06/20/2018 1307   HDL 64 12/10/2013 1702   CHOLHDL 4.6 (H) 06/20/2018 1307   LDLCALC 174 (H) 06/20/2018 1307   LDLCALC 79 12/10/2013 1702      Wt Readings from Last 3 Encounters:  05/10/19 149 lb 6.4 oz (67.8 kg)  04/17/19 151 lb 9.6 oz (68.8 kg)  04/03/19 150 lb (68 kg)      Other studies  Reviewed: Additional studies/ records that were reviewed today include: ED records. Review of the above records demonstrates:  Please see elsewhere in the note.     ASSESSMENT AND PLAN:  SYNCOPE:   Ms. start with a 1 month event monitor.  Further evaluation will be based on these results and future symptoms.  BRUIT: She does have cardiovascular risk factors.  I am going to check carotid Dopplers.  ORTHOSTATIC HYPOTENSION: She had an increased heart rate with standing but no decrease in her blood pressure.  No change in therapy.  DYSLIPIDEMIA: She has been intolerant of statins.  I will have a low threshold for coronary calcium scoring or even CTA based on the results above as this may help me guide the necessity of more aggressive therapy such as PCSK9.  Current medicines are reviewed at length with the patient today.  The patient does not have concerns regarding medicines.  The following changes have been made:  no change  Labs/ tests ordered today include:   Orders Placed This Encounter  Procedures  . CARDIAC EVENT MONITOR  . VAS US CAROTID     Disposition:   FU with me after the results of the above.    Signed, Minus Breeding, MD  05/10/2019 4:36 PM    [facility]

## 2019-05-10 ENCOUNTER — Encounter: Payer: Self-pay | Admitting: Cardiology

## 2019-05-10 ENCOUNTER — Encounter

## 2019-05-10 ENCOUNTER — Ambulatory Visit (INDEPENDENT_AMBULATORY_CARE_PROVIDER_SITE_OTHER): Payer: Medicare Other | Admitting: Cardiology

## 2019-05-10 VITALS — Temp 97.0°F | Ht 66.0 in | Wt 149.4 lb

## 2019-05-10 DIAGNOSIS — I951 Orthostatic hypotension: Secondary | ICD-10-CM

## 2019-05-10 DIAGNOSIS — R55 Syncope and collapse: Secondary | ICD-10-CM

## 2019-05-10 DIAGNOSIS — R0989 Other specified symptoms and signs involving the circulatory and respiratory systems: Secondary | ICD-10-CM

## 2019-05-10 NOTE — Patient Instructions (Signed)
Medication Instructions:  Your physician recommends that you continue on your current medications as directed. Please refer to the Current Medication list given to you today.  If you need a refill on your cardiac medications before your next appointment, please call your pharmacy.   Lab work: NONE  Testing/Procedures: Your physician has requested that you have a carotid duplex. This test is an ultrasound of the carotid arteries in your neck. It looks at blood flow through these arteries that supply the brain with blood. Allow one hour for this exam. There are no restrictions or special instructions.  Your physician has recommended that you wear an event monitor. Event monitors are medical devices that record the heart's electrical activity. Doctors most often [place] these monitors to diagnose arrhythmias. Arrhythmias are problems with the speed or rhythm of the heartbeat. The monitor is a small, portable device. You can wear one while you do your normal daily activities. This is usually used to diagnose what is causing palpitations/syncope (passing out). 1 MONTH  Follow-Up: AFTER ECHO AND MONITOR BACK

## 2019-05-11 LAB — URINE CULTURE

## 2019-05-13 ENCOUNTER — Telehealth: Payer: Self-pay | Admitting: *Deleted

## 2019-05-13 ENCOUNTER — Other Ambulatory Visit: Payer: Self-pay | Admitting: Cardiology

## 2019-05-13 ENCOUNTER — Other Ambulatory Visit: Payer: Self-pay | Admitting: Family Medicine

## 2019-05-13 DIAGNOSIS — R55 Syncope and collapse: Secondary | ICD-10-CM

## 2019-05-13 DIAGNOSIS — R0989 Other specified symptoms and signs involving the circulatory and respiratory systems: Secondary | ICD-10-CM

## 2019-05-13 MED ORDER — NITROFURANTOIN MONOHYD MACRO 100 MG PO CAPS: 100.0000 mg | ORAL_CAPSULE | Freq: Two times a day (BID) | ORAL | 0 refills | Status: DC

## 2019-05-13 NOTE — Telephone Encounter (Signed)
[employer] to ship 30 day cardiac event monitor to patients home.  Instructions reviewed briefly as they are included in the monitor kit.

## 2019-05-14 ENCOUNTER — Ambulatory Visit: Payer: Self-pay | Admitting: *Deleted

## 2019-05-14 DIAGNOSIS — N8189 Other female genital prolapse: Secondary | ICD-10-CM

## 2019-05-14 DIAGNOSIS — N3941 Urge incontinence: Secondary | ICD-10-CM

## 2019-05-14 DIAGNOSIS — F411 Generalized anxiety disorder: Secondary | ICD-10-CM

## 2019-05-14 NOTE — Chronic Care Management (AMB) (Signed)
  Chronic Care Management   Care Coordination Note   05/14/2019 Name: Cassandra [name] MRN: [id number] DOB: 09/29/1955  Referred by: [name], MD Reason for referral : Chronic Care Management (Care Coordination)   Cassandra [name] is a 63 y.o. year old female who is a primary care patient of Stacks, Cletus Gash, MD. The CCM team was consulted for assistance with chronic disease management and care coordination needs.    Review of patient status, including review of consultants reports, relevant laboratory and other test results, and collaboration with appropriate care team members and the patient's provider was performed as part of comprehensive patient evaluation and provision of chronic care management services.   Refer to previous CCM telephone visit regarding pelvic organ prolapse and complications with mesh sling. Patient requests second opinion.    Goals Addressed            This Visit's Progress     Patient Stated   . "I would like a second opinion on the bladder sling and prolapse" (pt-stated)       Current Barriers:  Marland Kitchen Knowledge Deficits related to provider options . Transportation barriers  Nurse Case Manager Clinical Goal(s):  Marland Kitchen Over the next 7 days, patient will verbalize understanding of plan for scheduling a second opinion with a urological gynecologist through [facility]  . Over the next 30 days, patient will attend all scheduled medical appointments: Referral to Urological Gynecologist pending  Interventions:  . Collaborated with Dr Livia Snellen regarding referral to urological gynecologist . Researched provider options in [place].  o [facility] Urological Gynecology at [address] [place] Number [phone] o Providers: Maryland Pink, MD & Rockey Situ, MD  Patient Self Care Activities:  . Performs ADL's independently . Performs IADL's independently  Initial goal documentation        Plan  Orders Placed This Encounter  .  Ambulatory referral to Urogynecology    Referral Priority:   Routine    Referral Type:   Consultation (2nd opinion)    Referred to Provider:   Marti Sleigh, MD    Requested Specialty:   Urological Gynecology    Location:   Phone Number: [place], [place] [address] [phone]   Patient scheduled for telephone f/u with RNCM on 05/16/2019    Chong Sicilian BSN, RN-BC [place] / [place] Management Direct Dial: [phone]

## 2019-05-14 NOTE — Patient Instructions (Signed)
Plan  Orders Placed This Encounter  . Ambulatory referral to Urogynecology    Referral Priority:   Routine    Referral Type:   Consultation (2nd opinion)    Referred to Provider:   Marti Sleigh, MD    Requested Specialty:   Urological Gynecology    Location:   Phone Number: [place], [place] [address] [phone]   Patient scheduled for telephone f/u with [facility] on 05/16/2019  The patient verbalized understanding of instructions provided today and declined a print copy of patient instruction materials.    Chong Sicilian BSN, RN-BC Embedded Chronic Care Manager [facility] / [place] Management Direct Dial: [phone]

## 2019-05-15 ENCOUNTER — Ambulatory Visit (HOSPITAL_COMMUNITY)
Admission: RE | Admit: 2019-05-15 | Discharge: 2019-05-15 | Disposition: A | Discharge status: Home / Self Care | Payer: Medicare Other | Source: Ambulatory Visit | Attending: Cardiology | Admitting: Cardiology

## 2019-05-15 ENCOUNTER — Other Ambulatory Visit: Payer: Self-pay

## 2019-05-15 ENCOUNTER — Ambulatory Visit (INDEPENDENT_AMBULATORY_CARE_PROVIDER_SITE_OTHER): Payer: Medicare Other

## 2019-05-15 DIAGNOSIS — R0989 Other specified symptoms and signs involving the circulatory and respiratory systems: Secondary | ICD-10-CM | POA: Diagnosis not present

## 2019-05-15 DIAGNOSIS — R55 Syncope and collapse: Secondary | ICD-10-CM | POA: Diagnosis not present

## 2019-05-15 DIAGNOSIS — I951 Orthostatic hypotension: Secondary | ICD-10-CM

## 2019-05-16 ENCOUNTER — Ambulatory Visit: Payer: Medicare Other | Admitting: *Deleted

## 2019-05-16 DIAGNOSIS — F411 Generalized anxiety disorder: Secondary | ICD-10-CM

## 2019-05-16 DIAGNOSIS — G8928 Other chronic postprocedural pain: Secondary | ICD-10-CM

## 2019-05-16 DIAGNOSIS — N3941 Urge incontinence: Secondary | ICD-10-CM

## 2019-05-16 DIAGNOSIS — N8189 Other female genital prolapse: Secondary | ICD-10-CM

## 2019-05-16 NOTE — Chronic Care Management (AMB) (Signed)
  Chronic Care Management   Follow Up Note   05/16/2019 Name: Cassandra [name] MRN: [id number] DOB: 10/31/1955  Referred by: Cassandra Fraise, MD Reason for referral : Chronic Care Management (Care Coordination)   Cassandra [name] is a 63 y.o. year old female who is a primary care patient of Stacks, Cletus Gash, MD. The CCM team was consulted for assistance with chronic disease management and care coordination needs.    Review of patient status, including review of consultants reports, relevant laboratory and other test results, and collaboration with appropriate care team members and the patient's provider was performed as part of comprehensive patient evaluation and provision of chronic care management services.     Goals Addressed            This Visit's Progress     Patient Stated   . "I want to prevent UTIs" (pt-stated)       Current Barriers:  . Chronic Disease Management support and education needs related to recurrent UTIs . Patient reports she has 3 days remaining on course of oral abx prescribed by PCP and is taking daily as prescribed  Nurse Case Manager Clinical Goal(s):  Marland Kitchen Over the next 7 days, patient will complete macrobid prescription for UTI treatment  Interventions:  . Chart reviewed o Urine specimen was brought and a UA was positive for bacteria o Urine culture came back on 05/13/19 and was positive for an E. Coli infection and sensitive to Macrobid o Script was sent in for 7 day course to be taken twice a day o Patient is supposed to leave a urine for repeat UA once antibiotics are completed . Will have urine specimen cup left up front for patient to pickup   Patient Self Care Activities:  . Self administers medications as prescribed . Attends all scheduled provider appointments . Performs ADL's independently . Performs IADL's independently . Calls provider office for new concerns or questions  Please see past updates related to this goal by clicking on the "Past  Updates" button in the selected goal      . "I would like a second opinion on the bladder sling and prolapse" (pt-stated)       Current Barriers:  Marland Kitchen Knowledge Deficits related to provider options . Transportation barriers  Nurse Case Manager Clinical Goal(s):  Marland Kitchen Over the next 30 days, patient will attend all scheduled medical appointments: Cassandra [name] 06/19/2019 at 11:30 through [facility] at [address] [place] location [phone])  Interventions:  Marland Kitchen Verified that patient is aware of the appointment and location . Discussed prior surgery and assessment. Patient said that Cassandra [name] did not feel surgery to remove the mesh sling would improve her symptoms and may actually make things worse  Patient Self Care Activities:  . Performs ADL's independently . Performs IADL's independently  Please see past updates related to this goal by clicking on the "Past Updates" button in the selected goal          Plan The care management team will reach out to the patient again over the next 7 days.    Cassandra [name] BSN, RN-BC Embedded Chronic Care Manager [facility] / [place] Management Direct Dial: [phone]

## 2019-05-16 NOTE — Patient Instructions (Addendum)
Visit Information  Goals Addressed            This Visit's Progress     Patient Stated   . "I want to prevent UTIs" (pt-stated)       Current Barriers:  . Chronic Disease Management support and education needs related to recurrent UTIs . Patient reports she has 3 days remaining on course of oral abx prescribed by PCP and is taking daily as prescribed  Nurse Case Manager Clinical Goal(s):  Marland Kitchen Over the next 7 days, patient will complete macrobid prescription for UTI treatment  Interventions:  . Chart reviewed o Urine specimen was brought and a UA was positive for bacteria o Urine culture came back on 05/13/19 and was positive for an E. Coli infection and sensitive to Macrobid o Script was sent in for 7 day course to be taken twice a day o Patient is supposed to leave a urine for repeat UA once antibiotics are completed . Will have urine specimen cup left up front for patient to pickup   Patient Self Care Activities:  . Self administers medications as prescribed . Attends all scheduled provider appointments . Performs ADL's independently . Performs IADL's independently . Calls provider office for new concerns or questions  Please see past updates related to this goal by clicking on the "Past Updates" button in the selected goal      . "I would like a second opinion on the bladder sling and prolapse" (pt-stated)       Current Barriers:  Marland Kitchen Knowledge Deficits related to provider options . Transportation barriers  Nurse Case Manager Clinical Goal(s):  Marland Kitchen Over the next 30 days, patient will attend all scheduled medical appointments: [name] 06/19/2019 at 11:30 through [facility] at [address] [place] location [phone])  Interventions:  Marland Kitchen Verified that patient is aware of the appointment and location . Discussed prior surgery and assessment. Patient said that Dr Amalia Hailey did not feel surgery to remove the mesh sling would improve her symptoms and may actually make things  worse  Patient Self Care Activities:  . Performs ADL's independently . Performs IADL's independently  Please see past updates related to this goal by clicking on the "Past Updates" button in the selected goal         The patient verbalized understanding of instructions provided today and declined a print copy of patient instruction materials.   Plan The care management team will reach out to the patient again over the next 7 days.    Chong Sicilian BSN, RN-BC Embedded Chronic Care Manager [facility] / [place] Management Direct Dial: [phone]

## 2019-05-17 ENCOUNTER — Ambulatory Visit (INDEPENDENT_AMBULATORY_CARE_PROVIDER_SITE_OTHER): Payer: Medicare Other | Admitting: *Deleted

## 2019-05-17 DIAGNOSIS — Z0000 Encounter for general adult medical examination without abnormal findings: Secondary | ICD-10-CM

## 2019-05-17 NOTE — Progress Notes (Signed)
MEDICARE ANNUAL WELLNESS VISIT  05/17/2019  Telephone Visit Disclaimer This Medicare AWV was conducted by telephone due to national recommendations for restrictions regarding the COVID-19 Pandemic (e.g. social distancing).  I verified, using two identifiers, that I am speaking with Cassandra [name] or their authorized healthcare agent. I discussed the limitations, risks, security, and privacy concerns of performing an evaluation and management service by telephone and the potential availability of an in-person appointment in the future. The patient expressed understanding and agreed to proceed.   Subjective:  Cassandra [name] is a 63 y.o. female patient of Stacks, Cassandra Gash, MD who had a Medicare Annual Wellness Visit today via telephone. Cassandra [name] is Disabled and lives alone. She had 2  Children and 1 died right after birth. She reports that she is socially active and does interact with friends/family regularly. She is minimally physically active and enjoys painting, planting flowers, sitting on porch and fishing.  Patient Care Team: [name], MD as PCP - General (Family Medicine) Minus Breeding, MD as PCP - Cardiology (Cardiology) Meredith Staggers, MD as Consulting Physician (Physical Medicine and Rehabilitation) Ilean China, RN as Registered Nurse Ilean China, RN as Registered Nurse  Advanced Directives 05/17/2019 04/03/2019 12/20/2017 06/12/2017 05/16/2017 04/14/2017 10/18/2016  Does Patient Have a Medical Advance Directive? No No No No No No Yes  Would patient like information on creating a medical advance directive? Yes (MAU/Ambulatory/Procedural Areas - Information given) No - Patient declined No - Patient declined - (No Data) - -    Hospital Utilization Over the Past 12 Months: # of hospitalizations or ER visits: 1 # of surgeries: 0  Review of Systems    Patient reports that her overall health is worse compared to last year.  History obtained from chart review and the patient   Patient Reported Readings (BP, Pulse, CBG, Weight, etc) none  Pain Assessment       Current Medications & Allergies (verified) Allergies as of 05/17/2019      Reactions   Chantix [varenicline] Rash   Codeine Itching, Nausea And Vomiting   Cymbalta [duloxetine Hcl] Nausea Only   Livalo [pitavastatin] Other (See Comments)   Seizures    Metoprolol Other (See Comments)   Had episode of possible hypotension or seizure described and muscles clamping down and searing pain toe to head and near fall x2. Loss of bowel control. No further symptoms after stopped.   Morphine And Related Hives   Itching hive rash   Oxycodone-aspirin Nausea And Vomiting   Oxycontin [oxycodone Hcl] Other (See Comments)   Altered mental status   Propoxyphene N-acetaminophen Nausea And Vomiting   Remeron [mirtazapine]    Shakes/tremors   Trazodone And Nefazodone Other (See Comments)   Medication is too strong   Zithromax [azithromycin] Other (See Comments)   Unknown    Other Rash   Lipturzet caused enlarged veins in legs   Oxycodone-acetaminophen Hives, Nausea And Vomiting, Rash   Septra [sulfamethoxazole-trimethoprim] Rash      Medication List       Accurate as of May 17, 2019  9:30 AM. If you have any questions, ask your nurse or doctor.        chlorhexidine 4 % external liquid Commonly known as: Hibiclens Shower daily for 5 days. Apply all over, including scalp and leave on  5 min before rinsing. What changed:   how much to take  how to take this  when to take this   cholecalciferol 1000 units tablet  Commonly known as: VITAMIN D Take 1,000 Units by mouth daily.   conjugated estrogens vaginal cream Commonly known as: PREMARIN Place vaginally daily. What changed:   how much to take  when to take this  reasons to take this   fexofenadine 180 MG tablet Commonly known as: ALLEGRA Take 1 tablet (180 mg total) by mouth daily as needed for allergies.   gabapentin 300 MG capsule  Commonly known as: NEURONTIN Take 300 mg by mouth 2 (two) times daily.   HYDROcodone-acetaminophen 7.5-325 MG tablet Commonly known as: NORCO Take 1 tablet by mouth daily as needed for moderate pain.   hydroxypropyl methylcellulose / hypromellose 2.5 % ophthalmic solution Commonly known as: ISOPTO TEARS / GONIOVISC Place 1 drop into both eyes as needed for dry eyes.   meclizine 25 MG tablet Commonly known as: ANTIVERT TAKE 1 TABLET BY MOUTH TWO  TIMES DAILY AS NEEDED What changed:   how much to take  how to take this  when to take this  reasons to take this  additional instructions   methocarbamol 500 MG tablet Commonly known as: ROBAXIN TAKE 1 TABLET BY MOUTH TWICE A DAY What changed: when to take this   nitrofurantoin (macrocrystal-monohydrate) 100 MG capsule Commonly known as: Macrobid Take 1 capsule (100 mg total) by mouth 2 (two) times daily.   ondansetron 8 MG disintegrating tablet Commonly known as: ZOFRAN-ODT Take 1 tablet (8 mg total) by mouth every 6 (six) hours as needed for nausea or vomiting.       History (reviewed): Past Medical History:  Diagnosis Date  . Anxiety   . Bowel habit changes    excessive gas /frequent BM's with change in shape with pus  . Chronic left shoulder pain    frozen   . Chronic pain    left upper shoulder ,neck and back  . COPD (chronic obstructive pulmonary disease) ([place])    Dr. Redge Gainer  . Depression   . Emphysema lung ([place])   . GERD (gastroesophageal reflux disease)   . Hyperlipidemia   . IBS (irritable bowel syndrome)   . Maternal complication related to childbirth    Sepsis, staph  . Meniere's disease   . Nicotine addiction    2 packs per day   . Skin cancer    Rt. leg, squamous cell  . Tubular adenoma of colon 01/31/2000   Dr. Richmond Campbell  . Weakness of pelvic floor    prolapse -  MRI from 2017   Past Surgical History:  Procedure Laterality Date  . ABDOMINAL HYSTERECTOMY     age 51  . ANAL  RECTAL MANOMETRY N/A 12/09/2015   Procedure: ANO RECTAL MANOMETRY;  Surgeon: Mauri Pole, MD;  Location: [facility];  Service: Endoscopy;  Laterality: N/A;  . BLADDER REPAIR     sling/with perforation  . CESAREAN SECTION     1 time  . KNEE ARTHROSCOPY Left   . ruptured uterus    . SHOULDER ARTHROSCOPY     multiple   Family History  Problem Relation Age of Onset  . Coronary artery disease Mother   . Diabetes Mother        borderline  . Kidney disease Mother        stage 4  . Coronary artery disease Father   . Prostate cancer Father   . Lung disease Father        black lung  . Liver cancer Maternal Grandmother   . Liver disease Maternal Grandmother   .  Polymyalgia rheumatica Sister   . Heart disease Sister   . Coronary artery disease Brother        heart attack  . COPD Brother   . Colon polyps Sister   . Lung cancer Paternal Grandmother   . Colon cancer Other        Both sides   Social History   Socioeconomic History  . Marital status: Divorced    Spouse name: Not on file  . Number of children: 1  . Years of education: Not on file  . Highest education level: GED or equivalent  Occupational History  . Occupation: Agricultural engineer  Social Needs  . Financial resource strain: Not hard at all  . Food insecurity    Worry: Never true    Inability: Never true  . Transportation needs    Medical: No    Non-medical: No  Tobacco Use  . Smoking status: Former Smoker    Packs/day: 1.00    Years: 42.00    Pack years: 42.00    Types: Cigarettes    Quit date: 06/01/2015    Years since quitting: 3.9  . Smokeless tobacco: Never Used  Substance and Sexual Activity  . Alcohol use: No    Alcohol/week: 0.0 standard drinks  . Drug use: No  . Sexual activity: Yes  Lifestyle  . Physical activity    Days per week: 3 days    Minutes per session: 30 min  . Stress: Not at all  Relationships  . Social connections    Talks on phone: More than three times a week    Gets  together: More than three times a week    Attends religious service: 1 to 4 times per year    Active member of club or organization: No    Attends meetings of clubs or organizations: Never    Relationship status: Married  Other Topics Concern  . Not on file  Social History Narrative   Lives alone.  One child.  Two grands.      Activities of Daily Living In your present state of health, do you have any difficulty performing the following activities: 05/17/2019 04/22/2019  Hearing? N N  Vision? N -  Difficulty concentrating or making decisions? N -  Walking or climbing stairs? N -  Dressing or bathing? N -  Doing errands, shopping? N -  Preparing Food and eating ? N -  Using the Toilet? Y -  Comment Patient has had a bladder tack and it makes it difficult for her to urinate -  In the past six months, have you accidently leaked urine? Y -  Do you have problems with loss of bowel control? Y -  Some recent data might be hidden    Patient Education/ Literacy    Exercise Current Exercise Habits: Home exercise routine(Patient is currently not able to exercise due to syncopal episode and cardiologist recommending not exercisng right now.), Type of exercise: walking, Exercise limited by: cardiac condition(s)  Diet Patient reports consuming 2 meals a day and 0 snack(s) a day Patient reports that her primary diet is: Regular Patient reports that she does have regular access to food.   Depression Screen PHQ 2/9 Scores 05/17/2019 04/22/2019 12/12/2018 11/26/2018 10/04/2018 06/20/2018 06/19/2018  PHQ - 2 Score 0 0 2 3 2  0 2  PHQ- 9 Score - - - 13 11 - -  Exception Documentation - - - - - - -  Not completed - - - - - - -  Fall Risk Fall Risk  05/17/2019 04/22/2019 02/06/2019 12/12/2018 06/20/2018  Falls in the past year? 1 1 0 1 Yes  Comment - - - july 2019 -  Number falls in past yr: 0 0 - 0 1  Comment - - - - -  Injury with Fall? 0 0 - - -  Comment - - - - -  Risk Factor Category  - - - - -   Comment - - - - -  Risk for fall due to : Other (Comment) Other (Comment) - - -  Risk for fall due to: Comment - vertigo - - -  Follow up Falls prevention discussed Falls prevention discussed - - -  Comment vertigo - - - -     Objective:  Cassandra [name] seemed alert and oriented and she participated appropriately during our telephone visit.  Blood Pressure Weight BMI  BP Readings from Last 3 Encounters:  04/17/19 124/82  04/03/19 130/71  12/12/18 118/78   Wt Readings from Last 3 Encounters:  05/10/19 149 lb 6.4 oz (67.8 kg)  04/17/19 151 lb 9.6 oz (68.8 kg)  04/03/19 150 lb (68 kg)   BMI Readings from Last 1 Encounters:  05/10/19 24.11 kg/m    *Unable to obtain current vital signs, weight, and BMI due to telephone visit type  Hearing/Vision  . [place] did not seem to have difficulty with hearing/understanding during the telephone conversation . Reports that she has not had a formal eye exam by an eye care professional within the past year . Reports that she has not had a formal hearing evaluation within the past year *Unable to fully assess hearing and vision during telephone visit type  Cognitive Function: 6CIT Screen 05/17/2019  What Year? 0 points  What month? 0 points  What time? 0 points  Count back from 20 0 points  Months in reverse 0 points  Repeat phrase 0 points  Total Score 0   (Normal:0-7, Significant for Dysfunction: >8)  Normal Cognitive Function Screening: Yes   Immunization & Health Maintenance Record Immunization History  Administered Date(s) Administered  . Influenza Split 06/21/2010  . Influenza, Seasonal, Injecte, Preservative Fre 06/25/2014  . Influenza,inj,Quad PF,6+ Mos 07/09/2013, 06/09/2015, 06/27/2016, 06/20/2017, 06/20/2018  . Td 05/13/2008    Health Maintenance  Topic Date Due  . TETANUS/TDAP  05/13/2018  . INFLUENZA VACCINE  04/13/2019  . PAP SMEAR-Modifier  03/25/2019  . MAMMOGRAM  10/11/2019  . COLONOSCOPY  08/04/2025  .  Hepatitis C Screening  Completed  . HIV Screening  Completed       Assessment  This is a routine wellness examination for KALESHIA MUELLER.  Health Maintenance: Due or Overdue Health Maintenance Due  Topic Date Due  . TETANUS/TDAP  05/13/2018  . INFLUENZA VACCINE  04/13/2019  . PAP SMEAR-Modifier  03/25/2019    Cassandra [name] does not need a referral for Community Assistance: Care Management:   Yes patient currently has a referral in place.  Social Work:    no Prescription Assistance:  no Nutrition/Diabetes Education:  no   Plan:  Personalized Goals Goals Addressed            This Visit's Progress   . AWV       05/17/2019 AWV Goal: Fall Prevention  . Over the next year, patient will decrease their risk for falls by: o Using assistive devices, such as a cane or walker, as needed o Identifying fall risks within their home and correcting them by: -  Removing throw rugs - Adding handrails to stairs or ramps - Removing clutter and keeping a clear pathway throughout the home - Increasing light, especially at night - Adding shower handles/bars - Raising toilet seat o Identifying potential personal risk factors for falls: - Medication side effects - Incontinence/urgency - Vestibular dysfunction - Hearing loss - Musculoskeletal disorders - Neurological disorders - Orthostatic hypotension        Personalized Health Maintenance & Screening Recommendations  Pneumococcal vaccine  Influenza vaccine Screening Pap smear and pelvic exam   Lung Cancer Screening Recommended: no (Low Dose CT Chest recommended if Age 50-80 years, 30 pack-year currently smoking OR have quit w/in past 15 years) Hepatitis C Screening recommended: no HIV Screening recommended: no  Advanced Directives: Written information was not prepared per patient's request.  Referrals & Orders No orders of the defined types were placed in this encounter.   Follow-up Plan . Follow-up with [name], MD  as planned . Schedule pap . Schedule appointment for flu shot  . We will discuss tdap at next appointment.   I have personally reviewed and noted the following in the patient's chart:   . Medical and social history . Use of alcohol, tobacco or illicit drugs  . Current medications and supplements . Functional ability and status . Nutritional status . Physical activity . Advanced directives . List of other physicians . Hospitalizations, surgeries, and ER visits in previous 12 months . Vitals . Screenings to include cognitive, depression, and falls . Referrals and appointments  In addition, I have reviewed and discussed with Cassandra [name] certain preventive protocols, quality metrics, and best practice recommendations. A written personalized care plan for preventive services as well as general preventive health recommendations is available and can be mailed to the patient at her request.      Gareth Morgan, LPN  [id number]

## 2019-06-11 ENCOUNTER — Other Ambulatory Visit: Payer: Self-pay | Admitting: Family Medicine

## 2019-06-12 DIAGNOSIS — I951 Orthostatic hypotension: Secondary | ICD-10-CM

## 2019-06-12 DIAGNOSIS — E785 Hyperlipidemia, unspecified: Secondary | ICD-10-CM | POA: Insufficient documentation

## 2019-06-12 DIAGNOSIS — R55 Syncope and collapse: Secondary | ICD-10-CM

## 2019-06-12 HISTORY — DX: Orthostatic hypotension: I95.1

## 2019-06-12 HISTORY — DX: Syncope and collapse: R55

## 2019-06-12 NOTE — Progress Notes (Signed)
Virtual Visit via Telephone Note   This visit type was conducted due to national recommendations for restrictions regarding the COVID-19 Pandemic (e.g. social distancing) in an effort to limit this patient's exposure and mitigate transmission in our community.  Due to her co-morbid illnesses, this patient is at least at moderate risk for complications without adequate follow up.  This format is felt to be most appropriate for this patient at this time.  The patient did not have access to video technology/had technical difficulties with video requiring transitioning to audio format only (telephone).  All issues noted in this document were discussed and addressed.  No physical exam could be performed with this format.  Please refer to the patient's chart for her  consent to telehealth for [facility].   Date:  06/13/2019   ID:  Cassandra [name], DOB [birth year], MRN [id number]  Patient Location: Home Provider Location: Home  PCP:  [name], MD  Cardiologist:  Minus Breeding, MD  Electrophysiologist:  None   Evaluation Performed:  Follow-Up Visit  Chief Complaint:  Fatigue  History of Present Illness:    Cassandra [name] is a 63 y.o. female with who was referred by [name], MD for evaluation of syncope and orthostatic hypotension.  I saw her in August for this.  She has been wearing an event monitor to see if any of her symptoms could be related to an arrhythmia.  I did review this today at this visit.  She has sinus rhythm and no significant arrhythmias.  She still wearing a monitor.  She really has not indicated any symptomatic events.  She says mostly what she is having is fatigue.  She has shortness of breath but this is been chronic.  She has been told she has some emphysema.  She is not describing chest pressure, neck or arm discomfort.  She is not describing any specific palpitations.  She is not been having any PND or orthopnea.  She is had no weight gain or edema.  She  preferred a video visit today.    The patient does not have symptoms concerning for COVID-19 infection (fever, chills, cough, or new shortness of breath).    Past Medical History:  Diagnosis Date  . Anxiety   . Bowel habit changes    excessive gas /frequent BM's with change in shape with pus  . Chronic left shoulder pain    frozen   . Chronic pain    left upper shoulder ,neck and back  . COPD (chronic obstructive pulmonary disease) ([place])    Dr. Redge Gainer  . Depression   . Emphysema lung ([place])   . GERD (gastroesophageal reflux disease)   . Hyperlipidemia   . IBS (irritable bowel syndrome)   . Maternal complication related to childbirth    Sepsis, staph  . Meniere's disease   . Nicotine addiction    2 packs per day   . Skin cancer    Rt. leg, squamous cell  . Tubular adenoma of colon 01/31/2000   Dr. Richmond Campbell  . Weakness of pelvic floor    prolapse -  MRI from 2017   Past Surgical History:  Procedure Laterality Date  . ABDOMINAL HYSTERECTOMY     age 47  . ANAL RECTAL MANOMETRY N/A 12/09/2015   Procedure: ANO RECTAL MANOMETRY;  Surgeon: Mauri Pole, MD;  Location: [facility];  Service: Endoscopy;  Laterality: N/A;  . BLADDER REPAIR     sling/with perforation  . CESAREAN SECTION  1 time  . KNEE ARTHROSCOPY Left   . ruptured uterus    . SHOULDER ARTHROSCOPY     multiple     Current Meds  Medication Sig  . chlorhexidine (HIBICLENS) 4 % external liquid Shower daily for 5 days. Apply all over, including scalp and leave on  5 min before rinsing. (Patient taking differently: Apply 1 application topically See admin instructions. Shower daily for 5 days. Apply all over, including scalp and leave on  5 min before rinsing.)  . cholecalciferol (VITAMIN D) 1000 units tablet Take 1,000 Units by mouth daily.  Marland Kitchen conjugated estrogens (PREMARIN) vaginal cream Place vaginally daily. (Patient taking differently: Place 1 Applicatorful vaginally daily as needed. )  .  fexofenadine (ALLEGRA) 180 MG tablet Take 1 tablet (180 mg total) by mouth daily as needed for allergies.  Marland Kitchen gabapentin (NEURONTIN) 300 MG capsule Take 300 mg by mouth 2 (two) times daily.  Marland Kitchen HYDROcodone-acetaminophen (NORCO) 7.5-325 MG tablet Take 1 tablet by mouth daily as needed for moderate pain.  . hydroxypropyl methylcellulose / hypromellose (ISOPTO TEARS / GONIOVISC) 2.5 % ophthalmic solution Place 1 drop into both eyes as needed for dry eyes.  . meclizine (ANTIVERT) 25 MG tablet TAKE 1 TABLET BY MOUTH TWO  TIMES DAILY AS NEEDED (Patient taking differently: Take 25 mg by mouth 2 (two) times daily as needed for dizziness or nausea. )  . methocarbamol (ROBAXIN) 500 MG tablet TAKE 1 TABLET BY MOUTH TWICE A DAY (Patient taking differently: Take 500 mg by mouth 2 (two) times a day. )  . nitrofurantoin, macrocrystal-monohydrate, (MACROBID) 100 MG capsule Take 1 capsule (100 mg total) by mouth 2 (two) times daily.  . ondansetron (ZOFRAN-ODT) 8 MG disintegrating tablet Take 1 tablet (8 mg total) by mouth every 6 (six) hours as needed for nausea or vomiting.     Allergies:   Chantix [varenicline], Codeine, Cymbalta [duloxetine hcl], Livalo [pitavastatin], Metoprolol, Morphine and related, Oxycodone-aspirin, Oxycontin [oxycodone hcl], Propoxyphene n-acetaminophen, Remeron [mirtazapine], Trazodone and nefazodone, Zithromax [azithromycin], Other, Oxycodone-acetaminophen, and Septra [sulfamethoxazole-trimethoprim]   Social History   Tobacco Use  . Smoking status: Former Smoker    Packs/day: 1.00    Years: 42.00    Pack years: 42.00    Types: Cigarettes    Quit date: 06/01/2015    Years since quitting: 4.0  . Smokeless tobacco: Never Used  Substance Use Topics  . Alcohol use: No    Alcohol/week: 0.0 standard drinks  . Drug use: No     Family Hx: The patient's family history includes COPD in her brother; Colon cancer in an other family member; Colon polyps in her sister; Coronary artery disease  in her brother, father, and mother; Diabetes in her mother; Early death in her son; Heart disease in her sister; Kidney disease in her mother; Liver cancer in her maternal grandmother; Liver disease in her maternal grandmother; Lung cancer in her paternal grandmother; Lung disease in her father; Polymyalgia rheumatica in her sister; Prostate cancer in her father.  ROS:    As stated in the HPI and negative for all other systems. All other systems reviewed and are negative.   Prior CV studies:   The following studies were reviewed today:  Event monitor  Labs/Other Tests and Data Reviewed:    EKG:  NA  Recent Labs: 11/26/2018: TSH 1.410 04/03/2019: ALT 11; BUN 14; Creatinine, Ser 1.01; Hemoglobin 14.0; Platelets 319; Potassium 3.9; Sodium 140   Recent Lipid Panel Lab Results  Component Value Date/Time  CHOL 244 (H) 06/20/2018 01:07 PM   TRIG 86 06/20/2018 01:07 PM   TRIG 71 12/10/2013 05:02 PM   HDL 53 06/20/2018 01:07 PM   HDL 64 12/10/2013 05:02 PM   CHOLHDL 4.6 (H) 06/20/2018 01:07 PM   LDLCALC 174 (H) 06/20/2018 01:07 PM   LDLCALC 79 12/10/2013 05:02 PM    Wt Readings from Last 3 Encounters:  05/10/19 149 lb 6.4 oz (67.8 kg)  04/17/19 151 lb 9.6 oz (68.8 kg)  04/03/19 150 lb (68 kg)     Objective:    Vital Signs:  Ht 5\' 6"  (1.676 m)   BMI 24.11 kg/m    NA  ASSESSMENT & PLAN:    SYNCOPE:    The patient is had no further events.  No further work-up is suggested.  BRUIT:    She had minimal plaque on Doppler earlier this month.  No further imaging is indicated..  ORTHOSTATIC HYPOTENSION:  She is having no symptoms consistent with this and there were no recorded drops in her blood pressure previously.  No change in therapy.   DYSLIPIDEMIA: She has been intolerant of statins.    She will continue with risk factor modification.  FATIGUE: She is not describing any overt cardiovascular symptoms.  She had a normal thyroid.  She has had normal CBC.  I reviewed these  labs for this appointment.  No change in therapy.  COVID-19 Education: The signs and symptoms of COVID-19 were discussed with the patient and how to seek care for testing (follow up with PCP or arrange E-visit).  The importance of social distancing was discussed today.  Time:   Today, I have spent 16 minutes (25 minutes total with chart and monitor review.) with the patient with telehealth technology discussing the above problems.     Medication Adjustments/Labs and Tests Ordered: Current medicines are reviewed at length with the patient today.  Concerns regarding medicines are outlined above.   Tests Ordered: No orders of the defined types were placed in this encounter.   Medication Changes: No orders of the defined types were placed in this encounter.   Follow Up:  In Person prn  Signed, Minus Breeding, MD  06/13/2019 1:16 PM    [facility]

## 2019-06-13 ENCOUNTER — Telehealth (INDEPENDENT_AMBULATORY_CARE_PROVIDER_SITE_OTHER): Payer: Medicare Other | Admitting: Cardiology

## 2019-06-13 ENCOUNTER — Encounter: Payer: Self-pay | Admitting: Cardiology

## 2019-06-13 VITALS — Ht 66.0 in

## 2019-06-13 DIAGNOSIS — I951 Orthostatic hypotension: Secondary | ICD-10-CM | POA: Diagnosis not present

## 2019-06-13 DIAGNOSIS — R55 Syncope and collapse: Secondary | ICD-10-CM

## 2019-06-13 DIAGNOSIS — E785 Hyperlipidemia, unspecified: Secondary | ICD-10-CM | POA: Diagnosis not present

## 2019-06-13 NOTE — Patient Instructions (Signed)
Medication Instructions:  Your physician recommends that you continue on your current medications as directed. Please refer to the Current Medication list given to you today.  If you need a refill on your cardiac medications before your next appointment, please call your pharmacy.   Lab work: NONE  Testing/Procedures: NONE  Follow-Up: Make appointment as needed.

## 2019-06-14 ENCOUNTER — Encounter: Payer: Medicare Other | Admitting: Registered Nurse

## 2019-06-17 ENCOUNTER — Telehealth: Payer: Self-pay

## 2019-06-17 DIAGNOSIS — M7502 Adhesive capsulitis of left shoulder: Secondary | ICD-10-CM

## 2019-06-17 NOTE — Telephone Encounter (Signed)
Patient called stating she is still having a terrible cough and wants to know what to do about her appt and medication. When I looked in appts her appt already cancelled for tomorrow.

## 2019-06-18 ENCOUNTER — Ambulatory Visit: Payer: Medicare Other | Admitting: Registered Nurse

## 2019-06-18 MED ORDER — HYDROCODONE-ACETAMINOPHEN 7.5-325 MG PO TABS: 1.0000 | ORAL_TABLET | Freq: Every day | ORAL | 0 refills | Status: DC | PRN

## 2019-06-18 NOTE — Telephone Encounter (Signed)
Placed a call to Ms. Badour this morning. Hydrocodone e-scribed, she verbalizes understanding.

## 2019-06-21 ENCOUNTER — Ambulatory Visit: Payer: Medicare Other | Admitting: Cardiology

## 2019-06-26 DIAGNOSIS — Z87440 Personal history of urinary (tract) infections: Secondary | ICD-10-CM | POA: Diagnosis not present

## 2019-06-26 DIAGNOSIS — R319 Hematuria, unspecified: Secondary | ICD-10-CM | POA: Diagnosis not present

## 2019-07-02 ENCOUNTER — Encounter: Payer: Medicare Other | Admitting: Registered Nurse

## 2019-07-15 ENCOUNTER — Other Ambulatory Visit: Payer: Self-pay

## 2019-07-15 ENCOUNTER — Encounter: Payer: Self-pay | Admitting: Registered Nurse

## 2019-07-15 ENCOUNTER — Encounter: Payer: Medicare Other | Attending: Physical Medicine & Rehabilitation | Admitting: Registered Nurse

## 2019-07-15 VITALS — BP 116/77 | HR 90 | Temp 97.8°F | Ht 66.0 in | Wt 150.0 lb

## 2019-07-15 DIAGNOSIS — M5412 Radiculopathy, cervical region: Secondary | ICD-10-CM | POA: Diagnosis not present

## 2019-07-15 DIAGNOSIS — M542 Cervicalgia: Secondary | ICD-10-CM

## 2019-07-15 DIAGNOSIS — F419 Anxiety disorder, unspecified: Secondary | ICD-10-CM | POA: Diagnosis not present

## 2019-07-15 DIAGNOSIS — G8929 Other chronic pain: Secondary | ICD-10-CM | POA: Diagnosis not present

## 2019-07-15 DIAGNOSIS — F329 Major depressive disorder, single episode, unspecified: Secondary | ICD-10-CM | POA: Diagnosis not present

## 2019-07-15 DIAGNOSIS — Z5181 Encounter for therapeutic drug level monitoring: Secondary | ICD-10-CM

## 2019-07-15 DIAGNOSIS — M7631 Iliotibial band syndrome, right leg: Secondary | ICD-10-CM | POA: Insufficient documentation

## 2019-07-15 DIAGNOSIS — Z8042 Family history of malignant neoplasm of prostate: Secondary | ICD-10-CM | POA: Insufficient documentation

## 2019-07-15 DIAGNOSIS — Z833 Family history of diabetes mellitus: Secondary | ICD-10-CM | POA: Insufficient documentation

## 2019-07-15 DIAGNOSIS — M25562 Pain in left knee: Secondary | ICD-10-CM | POA: Insufficient documentation

## 2019-07-15 DIAGNOSIS — M25561 Pain in right knee: Secondary | ICD-10-CM | POA: Insufficient documentation

## 2019-07-15 DIAGNOSIS — Z825 Family history of asthma and other chronic lower respiratory diseases: Secondary | ICD-10-CM | POA: Insufficient documentation

## 2019-07-15 DIAGNOSIS — Z841 Family history of disorders of kidney and ureter: Secondary | ICD-10-CM | POA: Insufficient documentation

## 2019-07-15 DIAGNOSIS — M7502 Adhesive capsulitis of left shoulder: Secondary | ICD-10-CM | POA: Diagnosis not present

## 2019-07-15 DIAGNOSIS — Z85828 Personal history of other malignant neoplasm of skin: Secondary | ICD-10-CM | POA: Insufficient documentation

## 2019-07-15 DIAGNOSIS — G4700 Insomnia, unspecified: Secondary | ICD-10-CM | POA: Insufficient documentation

## 2019-07-15 DIAGNOSIS — G894 Chronic pain syndrome: Secondary | ICD-10-CM

## 2019-07-15 DIAGNOSIS — E785 Hyperlipidemia, unspecified: Secondary | ICD-10-CM | POA: Diagnosis not present

## 2019-07-15 DIAGNOSIS — M47812 Spondylosis without myelopathy or radiculopathy, cervical region: Secondary | ICD-10-CM

## 2019-07-15 DIAGNOSIS — Z79891 Long term (current) use of opiate analgesic: Secondary | ICD-10-CM

## 2019-07-15 DIAGNOSIS — M545 Low back pain, unspecified: Secondary | ICD-10-CM

## 2019-07-15 DIAGNOSIS — Z8249 Family history of ischemic heart disease and other diseases of the circulatory system: Secondary | ICD-10-CM | POA: Insufficient documentation

## 2019-07-15 DIAGNOSIS — Z87891 Personal history of nicotine dependence: Secondary | ICD-10-CM | POA: Diagnosis not present

## 2019-07-15 DIAGNOSIS — J449 Chronic obstructive pulmonary disease, unspecified: Secondary | ICD-10-CM | POA: Insufficient documentation

## 2019-07-15 DIAGNOSIS — M19012 Primary osteoarthritis, left shoulder: Secondary | ICD-10-CM | POA: Insufficient documentation

## 2019-07-15 DIAGNOSIS — K219 Gastro-esophageal reflux disease without esophagitis: Secondary | ICD-10-CM | POA: Diagnosis not present

## 2019-07-15 DIAGNOSIS — Z8371 Family history of colonic polyps: Secondary | ICD-10-CM | POA: Insufficient documentation

## 2019-07-15 DIAGNOSIS — Z801 Family history of malignant neoplasm of trachea, bronchus and lung: Secondary | ICD-10-CM | POA: Diagnosis not present

## 2019-07-15 DIAGNOSIS — Z90710 Acquired absence of both cervix and uterus: Secondary | ICD-10-CM | POA: Diagnosis not present

## 2019-07-15 MED ORDER — GABAPENTIN 100 MG PO CAPS: 100.0000 mg | ORAL_CAPSULE | Freq: Two times a day (BID) | ORAL | 3 refills | Status: DC

## 2019-07-15 MED ORDER — HYDROCODONE-ACETAMINOPHEN 7.5-325 MG PO TABS: 1.0000 | ORAL_TABLET | Freq: Every day | ORAL | 0 refills | Status: DC | PRN

## 2019-07-15 NOTE — Progress Notes (Signed)
Subjective:    Patient ID: Cassandra [name], female    DOB: 1955-11-23, 63 y.o.   MRN: [id number]  HPI: Cassandra [name] is a 63 y.o. female who returns for follow up appointment for chronic pain and medication refill. She states her pain is located in her neck radiating into left shoulder and left arm with tingling, burning and numbness and lower back pain and occasionally radiates into her left lower extremity she reports. Cassandra [name] reports increase intensity of neck pain at times she refuses X-ray at this time, she was instructed to call office if she changes her mind, she verbalizes understanding. Last MRI was in 2015. She rates her  Pain 7. Her current exercise regime is walking and performing stretching exercises.  Cassandra [name] Morphine equivalent is 7.50 MME.  UDS ordered today.   Pain Inventory Average Pain 7 Pain Right Now 7 My pain is sharp, burning, dull, tingling and aching  In the last 24 hours, has pain interfered with the following? General activity 8 Relation with others 8 Enjoyment of life 8 What TIME of day is your pain at its worst? night Sleep (in general) Fair  Pain is worse with: bending, sitting, inactivity and some activites Pain improves with: rest, heat/ice, therapy/exercise, pacing activities and medication Relief from Meds: 6  Mobility walk without assistance ability to climb steps?  yes do you drive?  yes  Function disabled: date disabled 2007  Neuro/Psych bladder control problems weakness numbness tremor tingling spasms dizziness  Prior Studies Any changes since last visit?  no  Physicians involved in your care Any changes since last visit?  no   Family History  Problem Relation Age of Onset  . Coronary artery disease Mother   . Diabetes Mother        borderline  . Kidney disease Mother        stage 4  . Coronary artery disease Father   . Prostate cancer Father   . Lung disease Father        black lung  . Liver cancer Maternal  Grandmother   . Liver disease Maternal Grandmother   . Polymyalgia rheumatica Sister   . Heart disease Sister   . Coronary artery disease Brother        heart attack  . COPD Brother   . Colon polyps Sister   . Lung cancer Paternal Grandmother   . Colon cancer Other        Both sides  . Early death Son    Social History   Socioeconomic History  . Marital status: Divorced    Spouse name: Not on file  . Number of children: 1  . Years of education: Not on file  . Highest education level: GED or equivalent  Occupational History  . Occupation: Agricultural engineer  Social Needs  . Financial resource strain: Not hard at all  . Food insecurity    Worry: Never true    Inability: Never true  . Transportation needs    Medical: No    Non-medical: No  Tobacco Use  . Smoking status: Former Smoker    Packs/day: 1.00    Years: 42.00    Pack years: 42.00    Types: Cigarettes    Quit date: 06/01/2015    Years since quitting: 4.1  . Smokeless tobacco: Never Used  Substance and Sexual Activity  . Alcohol use: No    Alcohol/week: 0.0 standard drinks  . Drug use: No  . Sexual activity: Yes  Lifestyle  . Physical activity    Days per week: 3 days    Minutes per session: 30 min  . Stress: Not at all  Relationships  . Social connections    Talks on phone: More than three times a week    Gets together: More than three times a week    Attends religious service: 1 to 4 times per year    Active member of club or organization: No    Attends meetings of clubs or organizations: Never    Relationship status: Married  Other Topics Concern  . Not on file  Social History Narrative   Lives alone.  One child.  Two grands.     Past Surgical History:  Procedure Laterality Date  . ABDOMINAL HYSTERECTOMY     age 24  . ANAL RECTAL MANOMETRY N/A 12/09/2015   Procedure: ANO RECTAL MANOMETRY;  Surgeon: Mauri Pole, MD;  Location: [facility];  Service: Endoscopy;  Laterality: N/A;  . BLADDER REPAIR      sling/with perforation  . CESAREAN SECTION     1 time  . KNEE ARTHROSCOPY Left   . ruptured uterus    . SHOULDER ARTHROSCOPY     multiple   Past Medical History:  Diagnosis Date  . Anxiety   . Bowel habit changes    excessive gas /frequent BM's with change in shape with pus  . Chronic left shoulder pain    frozen   . Chronic pain    left upper shoulder ,neck and back  . COPD (chronic obstructive pulmonary disease) ([place])    Dr. Redge Gainer  . Depression   . Emphysema lung ([place])   . GERD (gastroesophageal reflux disease)   . Hyperlipidemia   . IBS (irritable bowel syndrome)   . Maternal complication related to childbirth    Sepsis, staph  . Meniere's disease   . Nicotine addiction    2 packs per day   . Skin cancer    Rt. leg, squamous cell  . Tubular adenoma of colon 01/31/2000   Dr. Richmond Campbell  . Weakness of pelvic floor    prolapse -  MRI from 2017   BP 116/77   Pulse 90   Temp 97.8 F (36.6 C)   Ht 5\' 6"  (1.676 m)   Wt 150 lb (68 kg)   SpO2 98%   BMI 24.21 kg/m   Opioid Risk Score:   Fall Risk Score:  `1  Depression screen PHQ 2/9  Depression screen [facility] 2/9 05/17/2019 04/22/2019 12/12/2018 11/26/2018 10/04/2018 06/20/2018 06/19/2018  Decreased Interest 0 0 1 2 1  0 1  Down, Depressed, Hopeless 0 0 1 1 1  0 1  PHQ - 2 Score 0 0 2 3 2  0 2  Altered sleeping - - - 3 2 - -  Tired, decreased energy - - - 3 3 - -  Change in appetite - - - 3 3 - -  Feeling bad or failure about yourself  - - - 0 0 - -  Trouble concentrating - - - 1 1 - -  Moving slowly or fidgety/restless - - - 0 0 - -  Suicidal thoughts - - - 0 0 - -  PHQ-9 Score - - - 13 11 - -  Some recent data might be hidden     Review of Systems  Constitutional: Positive for appetite change.  HENT: Negative.   Eyes: Negative.   Respiratory: Positive for cough and shortness of breath.  Cardiovascular: Negative.   Gastrointestinal: Positive for constipation, diarrhea, nausea and vomiting.   Endocrine: Negative.   Genitourinary: Positive for difficulty urinating.  Musculoskeletal: Positive for myalgias.  Skin: Positive for rash.  Allergic/Immunologic: Negative.   Neurological: Positive for dizziness, tremors, weakness and numbness.  Hematological: Negative.   Psychiatric/Behavioral: Negative.   All other systems reviewed and are negative.      Objective:   Physical Exam Vitals signs and nursing note reviewed.  Constitutional:      Appearance: Normal appearance.  Neck:     Musculoskeletal: Normal range of motion and neck supple.     Comments: Cervical Paraspinal Tenderness: C-5-C-6 Cardiovascular:     Rate and Rhythm: Normal rate and regular rhythm.     Pulses: Normal pulses.     Heart sounds: Normal heart sounds.  Pulmonary:     Effort: Pulmonary effort is normal.     Breath sounds: Normal breath sounds.  Musculoskeletal:     Comments: Normal Muscle Bulk and Muscle Testing Reveals:  Upper Extremities: Right: Full ROM and Muscle Strength 5/5 Left: Decreased ROM 30 Degrees and Muscle Strength 3/5 Left AC Joint Tenderness  Thoracic Hypersensitivity: TY-1-T-7 Lower Extremities: Full ROM and Muscle Strength 5/5 Arises from chair with ease Narrow Based Gait   Skin:    General: Skin is warm and dry.  Neurological:     Mental Status: She is alert and oriented to person, place, and time.  Psychiatric:        Mood and Affect: Mood normal.        Behavior: Behavior normal.           Assessment & Plan:  1.Post-traumatic arthritis left shoulder s/p 2 shoulder surgeries. Now with adhesive capsulitis left shoulder.07/15/2019. Refilled:Hydrocodone7.5/325mg  one tablet dailyas needed. #30.  We will continue the opioid monitoring program, this consists of regular clinic visits, examinations, urine drug screen, pill counts as well as use of [place] Controlled Substance Reporting System. 2. Cervicalgia,Cervicalspondylosis/ Cervical Radiculitis: Continue  Current Medication Regime.Robaxinand Gabapentin dose changed to 100 mg BID, Gabapentin 300 mg discontinued due to daytime drowsiness. .07/15/2019. 3. Depression/insomnia/anxiety: Continue  To monitor. .Dr. Livia Snellen following. 07/15/2019 4. Myofascial Pain: Continue Robaxin. Continue to monitor. 07/15/2019 5. Chronic Pain of Knees: No complaints today. Continue to Monitor. 11/02/202020 6. Chronic Bilateral Thoracic Back Pain: Continue HEP as Tolerated. Continue to Alternate Ice and Heat Therapy. Continue current medication regimen.07/15/2019  13minutes of face to face patient care time was spent during this visit. All questions were encouraged and answered.   F/U in 2 months

## 2019-07-18 LAB — TOXASSURE SELECT,+ANTIDEPR,UR

## 2019-07-22 ENCOUNTER — Telehealth: Payer: Self-pay | Admitting: *Deleted

## 2019-07-22 NOTE — Telephone Encounter (Signed)
Urine drug screen for this encounter is consistent for prescribed medication 

## 2019-08-19 DIAGNOSIS — N390 Urinary tract infection, site not specified: Secondary | ICD-10-CM | POA: Diagnosis not present

## 2019-08-21 DIAGNOSIS — N3281 Overactive bladder: Secondary | ICD-10-CM | POA: Diagnosis not present

## 2019-08-21 DIAGNOSIS — N390 Urinary tract infection, site not specified: Secondary | ICD-10-CM | POA: Diagnosis not present

## 2019-09-16 ENCOUNTER — Encounter: Payer: Self-pay | Admitting: Registered Nurse

## 2019-09-16 ENCOUNTER — Encounter: Payer: Medicare Other | Attending: Physical Medicine & Rehabilitation | Admitting: Registered Nurse

## 2019-09-16 ENCOUNTER — Other Ambulatory Visit: Payer: Self-pay

## 2019-09-16 VITALS — BP 148/82 | HR 94 | Temp 97.7°F | Ht 66.0 in | Wt 147.6 lb

## 2019-09-16 DIAGNOSIS — Z833 Family history of diabetes mellitus: Secondary | ICD-10-CM | POA: Insufficient documentation

## 2019-09-16 DIAGNOSIS — Z87891 Personal history of nicotine dependence: Secondary | ICD-10-CM | POA: Insufficient documentation

## 2019-09-16 DIAGNOSIS — Z5181 Encounter for therapeutic drug level monitoring: Secondary | ICD-10-CM | POA: Diagnosis not present

## 2019-09-16 DIAGNOSIS — Z79891 Long term (current) use of opiate analgesic: Secondary | ICD-10-CM | POA: Insufficient documentation

## 2019-09-16 DIAGNOSIS — M25561 Pain in right knee: Secondary | ICD-10-CM | POA: Diagnosis not present

## 2019-09-16 DIAGNOSIS — F329 Major depressive disorder, single episode, unspecified: Secondary | ICD-10-CM | POA: Insufficient documentation

## 2019-09-16 DIAGNOSIS — M7631 Iliotibial band syndrome, right leg: Secondary | ICD-10-CM | POA: Insufficient documentation

## 2019-09-16 DIAGNOSIS — Z90710 Acquired absence of both cervix and uterus: Secondary | ICD-10-CM | POA: Insufficient documentation

## 2019-09-16 DIAGNOSIS — Z801 Family history of malignant neoplasm of trachea, bronchus and lung: Secondary | ICD-10-CM | POA: Diagnosis not present

## 2019-09-16 DIAGNOSIS — M542 Cervicalgia: Secondary | ICD-10-CM | POA: Diagnosis not present

## 2019-09-16 DIAGNOSIS — Z841 Family history of disorders of kidney and ureter: Secondary | ICD-10-CM | POA: Insufficient documentation

## 2019-09-16 DIAGNOSIS — J449 Chronic obstructive pulmonary disease, unspecified: Secondary | ICD-10-CM | POA: Diagnosis not present

## 2019-09-16 DIAGNOSIS — Z8249 Family history of ischemic heart disease and other diseases of the circulatory system: Secondary | ICD-10-CM | POA: Diagnosis not present

## 2019-09-16 DIAGNOSIS — Z8371 Family history of colonic polyps: Secondary | ICD-10-CM | POA: Diagnosis not present

## 2019-09-16 DIAGNOSIS — M546 Pain in thoracic spine: Secondary | ICD-10-CM

## 2019-09-16 DIAGNOSIS — Z8042 Family history of malignant neoplasm of prostate: Secondary | ICD-10-CM | POA: Insufficient documentation

## 2019-09-16 DIAGNOSIS — M19012 Primary osteoarthritis, left shoulder: Secondary | ICD-10-CM | POA: Insufficient documentation

## 2019-09-16 DIAGNOSIS — G894 Chronic pain syndrome: Secondary | ICD-10-CM

## 2019-09-16 DIAGNOSIS — E785 Hyperlipidemia, unspecified: Secondary | ICD-10-CM | POA: Insufficient documentation

## 2019-09-16 DIAGNOSIS — M47812 Spondylosis without myelopathy or radiculopathy, cervical region: Secondary | ICD-10-CM | POA: Diagnosis not present

## 2019-09-16 DIAGNOSIS — M25562 Pain in left knee: Secondary | ICD-10-CM | POA: Insufficient documentation

## 2019-09-16 DIAGNOSIS — F419 Anxiety disorder, unspecified: Secondary | ICD-10-CM | POA: Diagnosis not present

## 2019-09-16 DIAGNOSIS — G4700 Insomnia, unspecified: Secondary | ICD-10-CM | POA: Insufficient documentation

## 2019-09-16 DIAGNOSIS — M5412 Radiculopathy, cervical region: Secondary | ICD-10-CM

## 2019-09-16 DIAGNOSIS — G8929 Other chronic pain: Secondary | ICD-10-CM | POA: Diagnosis not present

## 2019-09-16 DIAGNOSIS — K219 Gastro-esophageal reflux disease without esophagitis: Secondary | ICD-10-CM | POA: Insufficient documentation

## 2019-09-16 DIAGNOSIS — M7502 Adhesive capsulitis of left shoulder: Secondary | ICD-10-CM | POA: Insufficient documentation

## 2019-09-16 DIAGNOSIS — Z825 Family history of asthma and other chronic lower respiratory diseases: Secondary | ICD-10-CM | POA: Insufficient documentation

## 2019-09-16 DIAGNOSIS — Z85828 Personal history of other malignant neoplasm of skin: Secondary | ICD-10-CM | POA: Insufficient documentation

## 2019-09-16 MED ORDER — HYDROCODONE-ACETAMINOPHEN 7.5-325 MG PO TABS: 1.0000 | ORAL_TABLET | Freq: Every day | ORAL | 0 refills | Status: DC | PRN

## 2019-09-16 NOTE — Progress Notes (Signed)
Subjective:    Patient ID: Cassandra [name], female    DOB: Dec 03, 1955, 64 y.o.   MRN: [id number]  HPI: Cassandra [name] is a 64 y.o. female who returns for follow up appointment for chronic pain and medication refill. She states her pain is located in her neck radiating into her left shoulder, left arm and upper back mainly left side with tingling. She rates her  Pain 7. Her current exercise regime is walking and performing stretching exercises.  Cassandra [name] Morphine equivalent is 7.50  MME.  Last UDS was Performed on 07/15/2019, it was consistent.   Pain Inventory Average Pain 7 Pain Right Now 7 My pain is sharp, burning, dull, stabbing, tingling and aching  In the last 24 hours, has pain interfered with the following? General activity 6 Relation with others 6 Enjoyment of life 6 What TIME of day is your pain at its worst? morning evening and night Sleep (in general) Poor  Pain is worse with: bending, inactivity and some activites Pain improves with: heat/ice, pacing activities and medication Relief from Meds: 5  Mobility walk without assistance ability to climb steps?  yes do you drive?  yes  Function disabled: date disabled 2007 I need assistance with the following:  dressing, meal prep and household duties  Neuro/Psych bladder control problems bowel control problems weakness numbness tremor tingling spasms  Prior Studies Any changes since last visit?  no  Physicians involved in your care Any changes since last visit?  no   Family History  Problem Relation Age of Onset  . Coronary artery disease Mother   . Diabetes Mother        borderline  . Kidney disease Mother        stage 4  . Coronary artery disease Father   . Prostate cancer Father   . Lung disease Father        black lung  . Liver cancer Maternal Grandmother   . Liver disease Maternal Grandmother   . Polymyalgia rheumatica Sister   . Heart disease Sister   . Coronary artery disease Brother    heart attack  . COPD Brother   . Colon polyps Sister   . Lung cancer Paternal Grandmother   . Colon cancer Other        Both sides  . Early death Son    Social History   Socioeconomic History  . Marital status: Divorced    Spouse name: Not on file  . Number of children: 1  . Years of education: Not on file  . Highest education level: GED or equivalent  Occupational History  . Occupation: Homemaker  Tobacco Use  . Smoking status: Former Smoker    Packs/day: 1.00    Years: 42.00    Pack years: 42.00    Types: Cigarettes    Quit date: 06/01/2015    Years since quitting: 4.2  . Smokeless tobacco: Never Used  Substance and Sexual Activity  . Alcohol use: No    Alcohol/week: 0.0 standard drinks  . Drug use: No  . Sexual activity: Yes  Other Topics Concern  . Not on file  Social History Narrative   Lives alone.  One child.  Two grands.     Social Determinants of Health   Financial Resource Strain:   . Difficulty of Paying Living Expenses: Not on file  Food Insecurity:   . Worried About Charity fundraiser in the Last Year: Not on file  . Ran Out of  Food in the Last Year: Not on file  Transportation Needs:   . Lack of Transportation (Medical): Not on file  . Lack of Transportation (Non-Medical): Not on file  Physical Activity:   . Days of Exercise per Week: Not on file  . Minutes of Exercise per Session: Not on file  Stress:   . Feeling of Stress : Not on file  Social Connections:   . Frequency of Communication with Friends and Family: Not on file  . Frequency of Social Gatherings with Friends and Family: Not on file  . Attends Religious Services: Not on file  . Active Member of Clubs or Organizations: Not on file  . Attends Archivist Meetings: Not on file  . Marital Status: Not on file   Past Surgical History:  Procedure Laterality Date  . ABDOMINAL HYSTERECTOMY     age 37  . ANAL RECTAL MANOMETRY N/A 12/09/2015   Procedure: ANO RECTAL MANOMETRY;   Surgeon: Mauri Pole, MD;  Location: [facility];  Service: Endoscopy;  Laterality: N/A;  . BLADDER REPAIR     sling/with perforation  . CESAREAN SECTION     1 time  . KNEE ARTHROSCOPY Left   . ruptured uterus    . SHOULDER ARTHROSCOPY     multiple   Past Medical History:  Diagnosis Date  . Anxiety   . Bowel habit changes    excessive gas /frequent BM's with change in shape with pus  . Chronic left shoulder pain    frozen   . Chronic pain    left upper shoulder ,neck and back  . COPD (chronic obstructive pulmonary disease) ([place])    Dr. Redge Gainer  . Depression   . Emphysema lung ([place])   . GERD (gastroesophageal reflux disease)   . Hyperlipidemia   . IBS (irritable bowel syndrome)   . Maternal complication related to childbirth    Sepsis, staph  . Meniere's disease   . Nicotine addiction    2 packs per day   . Skin cancer    Rt. leg, squamous cell  . Tubular adenoma of colon 01/31/2000   Dr. Richmond Campbell  . Weakness of pelvic floor    prolapse -  MRI from 2017   BP (!) 148/82 (BP Location: Right Arm, Patient Position: Sitting, Cuff Size: Normal)   Pulse 94   Temp 97.7 F (36.5 C)   Ht 5\' 6"  (1.676 m)   Wt 147 lb 9.6 oz (67 kg)   BMI 23.82 kg/m   Opioid Risk Score:   Fall Risk Score:  `1  Depression screen PHQ 2/9  Depression screen [facility] 2/9 05/17/2019 04/22/2019 12/12/2018 11/26/2018 10/04/2018 06/20/2018 06/19/2018  Decreased Interest 0 0 1 2 1  0 1  Down, Depressed, Hopeless 0 0 1 1 1  0 1  PHQ - 2 Score 0 0 2 3 2  0 2  Altered sleeping - - - 3 2 - -  Tired, decreased energy - - - 3 3 - -  Change in appetite - - - 3 3 - -  Feeling bad or failure about yourself  - - - 0 0 - -  Trouble concentrating - - - 1 1 - -  Moving slowly or fidgety/restless - - - 0 0 - -  Suicidal thoughts - - - 0 0 - -  PHQ-9 Score - - - 13 11 - -  Some recent data might be hidden    Review of Systems  Constitutional: Positive for appetite  change.  HENT: Negative.   Eyes:  Negative.   Respiratory: Negative.   Cardiovascular: Negative.   Gastrointestinal: Positive for abdominal pain, constipation, diarrhea and nausea.  Genitourinary: Positive for difficulty urinating and dysuria.  Musculoskeletal:       Spasms  Skin: Positive for rash.  Allergic/Immunologic: Negative.   Neurological: Positive for tremors, weakness and numbness.       Tingling  Hematological: Negative.   Psychiatric/Behavioral: Negative.   All other systems reviewed and are negative.      Objective:   Physical Exam Vitals and nursing note reviewed.  Constitutional:      Appearance: Normal appearance.  Neck:     Comments: Cervical Paraspinal Tenderness: C-5-C-6 Mainly Left Side Cardiovascular:     Rate and Rhythm: Normal rate and regular rhythm.     Pulses: Normal pulses.     Heart sounds: Normal heart sounds.  Pulmonary:     Effort: Pulmonary effort is normal.     Breath sounds: Normal breath sounds.  Musculoskeletal:     Cervical back: Normal range of motion and neck supple.     Comments: Normal Muscle Bulk and Muscle Testing Reveals:  Upper Extremities: Right: Full ROM and Muscle Strength 5/5 Left Upper Extremity: Decreased ROM 30 Degrees and Muscle Strength 4/5 Left AC Joint Tenderness  Thoracic Hypersensitivity: T-1-T-7  Lower Extremities: Full ROM and Muscle Strength 5/5  Arises from chair with ease Narrow Based Gait   Skin:    General: Skin is warm and dry.  Neurological:     Mental Status: She is alert and oriented to person, place, and time.  Psychiatric:        Mood and Affect: Mood normal.        Behavior: Behavior normal.           Assessment & Plan:  1.Post-traumatic arthritis left shoulder s/p 2 shoulder surgeries. Now with adhesive capsulitis left shoulder.09/16/2019. Refilled:Hydrocodone7.5/325mg  one tablet dailyas needed. #30.  We will continue the opioid monitoring program, this consists of regular clinic visits, examinations, urine drug  screen, pill counts as well as use of [place] Controlled Substance Reporting System. 2. Cervicalgia,Cervicalspondylosis/ Cervical Radiculitis: Continue Current Medication Regime.Robaxinand Gabapentin dose changed to 100 mg BID, Gabapentin 300 mg discontinued due to daytime drowsiness. Marland Kitchen01/12/2019. 3. Depression/insomnia/anxiety: Continue To monitor. .Dr. Kelby Fam. 09/16/2019. 4. Myofascial Pain: Continue Robaxin. Continue to monitor.09/16/2019 5. Chronic Pain of Knees: No complaints today. Continueto Monitor. 09/16/2019. 6. Chronic Bilateral Thoracic Back Pain: Continue HEP as Tolerated. Continue to Alternate Ice and Heat Therapy. Continue current medication regimen.09/16/2019  57minutes of face to face patient care time was spent during this visit. All questions were encouraged and answered.   F/U in 2 months

## 2019-10-22 ENCOUNTER — Ambulatory Visit: Payer: Medicare Other | Admitting: Registered Nurse

## 2019-11-19 ENCOUNTER — Encounter: Payer: Medicare Other | Attending: Physical Medicine & Rehabilitation | Admitting: Registered Nurse

## 2019-11-19 ENCOUNTER — Other Ambulatory Visit: Payer: Self-pay

## 2019-11-19 ENCOUNTER — Encounter: Payer: Self-pay | Admitting: Registered Nurse

## 2019-11-19 VITALS — BP 113/73 | HR 77 | Temp 97.9°F | Ht 66.0 in | Wt 147.0 lb

## 2019-11-19 DIAGNOSIS — M5412 Radiculopathy, cervical region: Secondary | ICD-10-CM

## 2019-11-19 DIAGNOSIS — M25562 Pain in left knee: Secondary | ICD-10-CM | POA: Insufficient documentation

## 2019-11-19 DIAGNOSIS — Z5181 Encounter for therapeutic drug level monitoring: Secondary | ICD-10-CM | POA: Diagnosis not present

## 2019-11-19 DIAGNOSIS — Z801 Family history of malignant neoplasm of trachea, bronchus and lung: Secondary | ICD-10-CM | POA: Diagnosis not present

## 2019-11-19 DIAGNOSIS — Z825 Family history of asthma and other chronic lower respiratory diseases: Secondary | ICD-10-CM | POA: Insufficient documentation

## 2019-11-19 DIAGNOSIS — M47812 Spondylosis without myelopathy or radiculopathy, cervical region: Secondary | ICD-10-CM | POA: Insufficient documentation

## 2019-11-19 DIAGNOSIS — Z87891 Personal history of nicotine dependence: Secondary | ICD-10-CM | POA: Insufficient documentation

## 2019-11-19 DIAGNOSIS — Z79891 Long term (current) use of opiate analgesic: Secondary | ICD-10-CM | POA: Diagnosis not present

## 2019-11-19 DIAGNOSIS — M19012 Primary osteoarthritis, left shoulder: Secondary | ICD-10-CM | POA: Diagnosis not present

## 2019-11-19 DIAGNOSIS — M25561 Pain in right knee: Secondary | ICD-10-CM | POA: Insufficient documentation

## 2019-11-19 DIAGNOSIS — Z841 Family history of disorders of kidney and ureter: Secondary | ICD-10-CM | POA: Insufficient documentation

## 2019-11-19 DIAGNOSIS — M546 Pain in thoracic spine: Secondary | ICD-10-CM

## 2019-11-19 DIAGNOSIS — M7502 Adhesive capsulitis of left shoulder: Secondary | ICD-10-CM | POA: Diagnosis not present

## 2019-11-19 DIAGNOSIS — G8929 Other chronic pain: Secondary | ICD-10-CM | POA: Insufficient documentation

## 2019-11-19 DIAGNOSIS — J449 Chronic obstructive pulmonary disease, unspecified: Secondary | ICD-10-CM | POA: Diagnosis not present

## 2019-11-19 DIAGNOSIS — F419 Anxiety disorder, unspecified: Secondary | ICD-10-CM | POA: Diagnosis not present

## 2019-11-19 DIAGNOSIS — Z90710 Acquired absence of both cervix and uterus: Secondary | ICD-10-CM | POA: Diagnosis not present

## 2019-11-19 DIAGNOSIS — G894 Chronic pain syndrome: Secondary | ICD-10-CM | POA: Insufficient documentation

## 2019-11-19 DIAGNOSIS — Z833 Family history of diabetes mellitus: Secondary | ICD-10-CM | POA: Diagnosis not present

## 2019-11-19 DIAGNOSIS — K219 Gastro-esophageal reflux disease without esophagitis: Secondary | ICD-10-CM | POA: Insufficient documentation

## 2019-11-19 DIAGNOSIS — M542 Cervicalgia: Secondary | ICD-10-CM | POA: Diagnosis not present

## 2019-11-19 DIAGNOSIS — M7631 Iliotibial band syndrome, right leg: Secondary | ICD-10-CM | POA: Insufficient documentation

## 2019-11-19 DIAGNOSIS — G4700 Insomnia, unspecified: Secondary | ICD-10-CM | POA: Diagnosis not present

## 2019-11-19 DIAGNOSIS — Z8249 Family history of ischemic heart disease and other diseases of the circulatory system: Secondary | ICD-10-CM | POA: Diagnosis not present

## 2019-11-19 DIAGNOSIS — Z8042 Family history of malignant neoplasm of prostate: Secondary | ICD-10-CM | POA: Insufficient documentation

## 2019-11-19 DIAGNOSIS — F329 Major depressive disorder, single episode, unspecified: Secondary | ICD-10-CM | POA: Insufficient documentation

## 2019-11-19 DIAGNOSIS — Z8371 Family history of colonic polyps: Secondary | ICD-10-CM | POA: Insufficient documentation

## 2019-11-19 DIAGNOSIS — Z85828 Personal history of other malignant neoplasm of skin: Secondary | ICD-10-CM | POA: Insufficient documentation

## 2019-11-19 DIAGNOSIS — E785 Hyperlipidemia, unspecified: Secondary | ICD-10-CM | POA: Insufficient documentation

## 2019-11-19 MED ORDER — HYDROCODONE-ACETAMINOPHEN 7.5-325 MG PO TABS: 1.0000 | ORAL_TABLET | Freq: Every day | ORAL | 0 refills | Status: DC | PRN

## 2019-11-19 NOTE — Progress Notes (Signed)
Subjective:    Patient ID: Cassandra [name], female    DOB: [birth year], 64 y.o.   MRN: [id number]  HPI: Cassandra [name] is a 64 y.o. female who returns for follow up appointment for chronic pain and medication refill. She states her pain is located in her neck radiating into her left shoulder and left arm and upper back pain mainly left side.  She rates her pain 7. Her current exercise regime is walking and performing stretching exercises.  Cassandra [name] Morphine equivalent is 7.50  MME.  UDS ordered today.    Pain Inventory Average Pain 7 Pain Right Now 7 My pain is constant, sharp, burning, dull, stabbing, tingling and aching  In the last 24 hours, has pain interfered with the following? General activity 8 Relation with others 8 Enjoyment of life 8 What TIME of day is your pain at its worst? morning, evening, night Sleep (in general) Poor  Pain is worse with: walking, bending, sitting, inactivity and some activites Pain improves with: heat/ice and medication Relief from Meds: 8  Mobility walk without assistance  Function disabled: date disabled .  Neuro/Psych bladder control problems bowel control problems weakness numbness tremor tingling spasms  Prior Studies Any changes since last visit?  no  Physicians involved in your care    Family History  Problem Relation Age of Onset  . Coronary artery disease Mother   . Diabetes Mother        borderline  . Kidney disease Mother        stage 4  . Coronary artery disease Father   . Prostate cancer Father   . Lung disease Father        black lung  . Liver cancer Maternal Grandmother   . Liver disease Maternal Grandmother   . Polymyalgia rheumatica Sister   . Heart disease Sister   . Coronary artery disease Brother        heart attack  . COPD Brother   . Colon polyps Sister   . Lung cancer Paternal Grandmother   . Colon cancer Other        Both sides  . Early death Son    Social History   Socioeconomic History    . Marital status: Divorced    Spouse name: Not on file  . Number of children: 1  . Years of education: Not on file  . Highest education level: GED or equivalent  Occupational History  . Occupation: Homemaker  Tobacco Use  . Smoking status: Former Smoker    Packs/day: 1.00    Years: 42.00    Pack years: 42.00    Types: Cigarettes    Quit date: 06/01/2015    Years since quitting: 4.4  . Smokeless tobacco: Never Used  Substance and Sexual Activity  . Alcohol use: No    Alcohol/week: 0.0 standard drinks  . Drug use: No  . Sexual activity: Yes  Other Topics Concern  . Not on file  Social History Narrative   Lives alone.  One child.  Two grands.     Social Determinants of Health   Financial Resource Strain:   . Difficulty of Paying Living Expenses: Not on file  Food Insecurity:   . Worried About Charity fundraiser in the Last Year: Not on file  . Ran Out of Food in the Last Year: Not on file  Transportation Needs:   . Lack of Transportation (Medical): Not on file  . Lack of Transportation (Non-Medical): Not on  file  Physical Activity:   . Days of Exercise per Week: Not on file  . Minutes of Exercise per Session: Not on file  Stress:   . Feeling of Stress : Not on file  Social Connections:   . Frequency of Communication with Friends and Family: Not on file  . Frequency of Social Gatherings with Friends and Family: Not on file  . Attends Religious Services: Not on file  . Active Member of Clubs or Organizations: Not on file  . Attends Archivist Meetings: Not on file  . Marital Status: Not on file   Past Surgical History:  Procedure Laterality Date  . ABDOMINAL HYSTERECTOMY     age 34  . ANAL RECTAL MANOMETRY N/A 12/09/2015   Procedure: ANO RECTAL MANOMETRY;  Surgeon: Mauri Pole, MD;  Location: [facility];  Service: Endoscopy;  Laterality: N/A;  . BLADDER REPAIR     sling/with perforation  . CESAREAN SECTION     1 time  . KNEE ARTHROSCOPY Left    . ruptured uterus    . SHOULDER ARTHROSCOPY     multiple   Past Medical History:  Diagnosis Date  . Anxiety   . Bowel habit changes    excessive gas /frequent BM's with change in shape with pus  . Chronic left shoulder pain    frozen   . Chronic pain    left upper shoulder ,neck and back  . COPD (chronic obstructive pulmonary disease) ([place])    Dr. Redge Gainer  . Depression   . Emphysema lung ([place])   . GERD (gastroesophageal reflux disease)   . Hyperlipidemia   . IBS (irritable bowel syndrome)   . Maternal complication related to childbirth    Sepsis, staph  . Meniere's disease   . Nicotine addiction    2 packs per day   . Skin cancer    Rt. leg, squamous cell  . Tubular adenoma of colon 01/31/2000   Dr. Richmond Campbell  . Weakness of pelvic floor    prolapse -  MRI from 2017   Temp 97.9 F (36.6 C)   Ht 5\' 6"  (1.676 m)   Wt 147 lb (66.7 kg)   BMI 23.73 kg/m   Opioid Risk Score:   Fall Risk Score:  `1  Depression screen PHQ 2/9  Depression screen [facility] 2/9 05/17/2019 04/22/2019 12/12/2018 11/26/2018 10/04/2018 06/20/2018 06/19/2018  Decreased Interest 0 0 1 2 1  0 1  Down, Depressed, Hopeless 0 0 1 1 1  0 1  PHQ - 2 Score 0 0 2 3 2  0 2  Altered sleeping - - - 3 2 - -  Tired, decreased energy - - - 3 3 - -  Change in appetite - - - 3 3 - -  Feeling bad or failure about yourself  - - - 0 0 - -  Trouble concentrating - - - 1 1 - -  Moving slowly or fidgety/restless - - - 0 0 - -  Suicidal thoughts - - - 0 0 - -  PHQ-9 Score - - - 13 11 - -  Some recent data might be hidden     Review of Systems  Constitutional: Positive for appetite change.  HENT: Negative.   Eyes: Negative.   Respiratory: Negative.   Cardiovascular: Negative.   Gastrointestinal: Positive for constipation and diarrhea.  Endocrine: Negative.   Genitourinary: Positive for difficulty urinating.  Musculoskeletal: Positive for arthralgias, back pain, neck pain and neck stiffness.  Spasms   Skin:  Negative.   Allergic/Immunologic: Negative.   Neurological: Positive for tremors, weakness and numbness.       Tingling  All other systems reviewed and are negative.      Objective:   Physical Exam Vitals and nursing note reviewed.  Constitutional:      Appearance: Normal appearance.  Cardiovascular:     Rate and Rhythm: Normal rate and regular rhythm.     Pulses: Normal pulses.     Heart sounds: Normal heart sounds.  Pulmonary:     Effort: Pulmonary effort is normal.     Breath sounds: Normal breath sounds.  Musculoskeletal:     Cervical back: Normal range of motion and neck supple.     Comments: Normal Muscle Bulk and Muscle Testing Reveals:  Upper Extremities: Right: Full ROM and Muscle Strength 5/5 Left: Decreased ROM 30 Degrees and Muscle Strength 4/5 Left AC Joint Tenderness Lower Extremities: Full ROM and Muscle Strength 5/5 Arises from chair with ease Narrow Based Gait   Skin:    General: Skin is warm and dry.  Neurological:     Mental Status: She is alert and oriented to person, place, and time.  Psychiatric:        Mood and Affect: Mood normal.        Behavior: Behavior normal.           Assessment & Plan:  1.Post-traumatic arthritis left shoulder s/p 2 shoulder surgeries. Now with adhesive capsulitis left shoulder.11/19/2019. Refilled:Hydrocodone7.5/325mg  one tablet dailyas needed. #30.  We will continue the opioid monitoring program, this consists of regular clinic visits, examinations, urine drug screen, pill counts as well as use of [place] Controlled Substance Reporting System. 2. Cervicalgia,Cervicalspondylosis/ Cervical Radiculitis: Continue Current Medication Regime.Robaxinand Gabapentindose changed to 100 mg BID, Gabapentin 300 mg discontinued due to daytime drowsiness.Marland Kitchen03/05/2020. 3. Depression/insomnia/anxiety: ContinueTo monitor..Dr. Kelby Fam.11/19/2019. 4. Myofascial Pain: Continue Robaxin. Continue to  monitor.11/19/2019 5. Chronic Pain of Knees:No complaints today.Continueto Monitor.11/19/2019. 6. Chronic Bilateral Thoracic Back Pain: Continue HEP as Tolerated. Continue to Alternate Ice and Heat Therapy. Continue current medication regimen.11/19/2019  3minutes of face to face patient care time was spent during this visit. All questions were encouraged and answered.   F/U in 2 months

## 2019-11-20 ENCOUNTER — Ambulatory Visit: Payer: Medicare Other | Admitting: *Deleted

## 2019-11-20 DIAGNOSIS — M19012 Primary osteoarthritis, left shoulder: Secondary | ICD-10-CM

## 2019-11-20 DIAGNOSIS — G8928 Other chronic postprocedural pain: Secondary | ICD-10-CM

## 2019-11-20 DIAGNOSIS — E782 Mixed hyperlipidemia: Secondary | ICD-10-CM

## 2019-11-20 NOTE — Chronic Care Management (AMB) (Signed)
  Chronic Care Management   Outreach Note  11/20/2019 Name: Cassandra [name] MRN: [id number] DOB: [birth year]  Referred by: [name], MD Reason for referral : Chronic Care Management (RN follow up)   An unsuccessful telephone follow-up was attempted today. The patient was referred to the case management team for assistance with care management and care coordination. Chart review shows that the patient has been seeing pain management and has seen urogynecologist with [facility].   Follow Up Plan: A HIPPA compliant phone message was left for the patient providing contact information and requesting a return call.  The care management team will reach out to the patient again over the next 45 days.   Chong Sicilian, BSN, RN-BC Embedded Chronic Care Manager [facility] [place] [facility] Direct Dial: [phone]

## 2019-11-23 LAB — TOXASSURE SELECT,+ANTIDEPR,UR

## 2019-12-03 ENCOUNTER — Telehealth: Payer: Self-pay

## 2019-12-03 NOTE — Telephone Encounter (Signed)
UDS RESULTS CONSISTENT WITH MEDICATIONS ON FILE  

## 2019-12-26 ENCOUNTER — Ambulatory Visit: Payer: Medicare Other | Attending: Internal Medicine

## 2019-12-26 DIAGNOSIS — Z23 Encounter for immunization: Secondary | ICD-10-CM

## 2019-12-26 NOTE — Progress Notes (Signed)
   Covid-19 Vaccination Clinic  Name:  Cassandra [name]    MRN: [id number] DOB: 08/22/1956  12/26/2019  [name] was observed post Covid-19 immunization for 30 minutes based on pre-vaccination screening without incident. She was provided with Vaccine Information Sheet and instruction to access the V-Safe system.   [name] was instructed to call 911 with any severe reactions post vaccine: Marland Kitchen Difficulty breathing  . Swelling of face and throat  . A fast heartbeat  . A bad rash all over body  . Dizziness and weakness   Immunizations Administered    Name Date Dose VIS Date Route   Moderna COVID-19 Vaccine 12/26/2019 10:58 AM 0.5 mL 08/13/2019 Intramuscular   Manufacturer: Moderna   Lot: [id number]   [id number]

## 2019-12-27 ENCOUNTER — Telehealth: Payer: Medicare Other

## 2020-01-16 ENCOUNTER — Encounter: Payer: Self-pay | Admitting: Registered Nurse

## 2020-01-16 ENCOUNTER — Other Ambulatory Visit: Payer: Self-pay

## 2020-01-16 ENCOUNTER — Encounter: Payer: Medicare Other | Attending: Physical Medicine & Rehabilitation | Admitting: Registered Nurse

## 2020-01-16 VITALS — BP 112/71 | Temp 96.5°F | Ht 66.0 in | Wt 145.6 lb

## 2020-01-16 DIAGNOSIS — Z8371 Family history of colonic polyps: Secondary | ICD-10-CM | POA: Insufficient documentation

## 2020-01-16 DIAGNOSIS — M7631 Iliotibial band syndrome, right leg: Secondary | ICD-10-CM | POA: Diagnosis not present

## 2020-01-16 DIAGNOSIS — M47812 Spondylosis without myelopathy or radiculopathy, cervical region: Secondary | ICD-10-CM | POA: Diagnosis not present

## 2020-01-16 DIAGNOSIS — F329 Major depressive disorder, single episode, unspecified: Secondary | ICD-10-CM | POA: Insufficient documentation

## 2020-01-16 DIAGNOSIS — Z841 Family history of disorders of kidney and ureter: Secondary | ICD-10-CM | POA: Diagnosis not present

## 2020-01-16 DIAGNOSIS — M542 Cervicalgia: Secondary | ICD-10-CM

## 2020-01-16 DIAGNOSIS — Z833 Family history of diabetes mellitus: Secondary | ICD-10-CM | POA: Diagnosis not present

## 2020-01-16 DIAGNOSIS — M546 Pain in thoracic spine: Secondary | ICD-10-CM

## 2020-01-16 DIAGNOSIS — M19012 Primary osteoarthritis, left shoulder: Secondary | ICD-10-CM | POA: Insufficient documentation

## 2020-01-16 DIAGNOSIS — Z8042 Family history of malignant neoplasm of prostate: Secondary | ICD-10-CM | POA: Insufficient documentation

## 2020-01-16 DIAGNOSIS — Z90710 Acquired absence of both cervix and uterus: Secondary | ICD-10-CM | POA: Insufficient documentation

## 2020-01-16 DIAGNOSIS — G8929 Other chronic pain: Secondary | ICD-10-CM | POA: Diagnosis not present

## 2020-01-16 DIAGNOSIS — F419 Anxiety disorder, unspecified: Secondary | ICD-10-CM | POA: Diagnosis not present

## 2020-01-16 DIAGNOSIS — K219 Gastro-esophageal reflux disease without esophagitis: Secondary | ICD-10-CM | POA: Insufficient documentation

## 2020-01-16 DIAGNOSIS — E785 Hyperlipidemia, unspecified: Secondary | ICD-10-CM | POA: Insufficient documentation

## 2020-01-16 DIAGNOSIS — Z8249 Family history of ischemic heart disease and other diseases of the circulatory system: Secondary | ICD-10-CM | POA: Diagnosis not present

## 2020-01-16 DIAGNOSIS — M25562 Pain in left knee: Secondary | ICD-10-CM | POA: Diagnosis not present

## 2020-01-16 DIAGNOSIS — G894 Chronic pain syndrome: Secondary | ICD-10-CM

## 2020-01-16 DIAGNOSIS — H532 Diplopia: Secondary | ICD-10-CM | POA: Diagnosis not present

## 2020-01-16 DIAGNOSIS — Z85828 Personal history of other malignant neoplasm of skin: Secondary | ICD-10-CM | POA: Insufficient documentation

## 2020-01-16 DIAGNOSIS — Z5181 Encounter for therapeutic drug level monitoring: Secondary | ICD-10-CM

## 2020-01-16 DIAGNOSIS — R202 Paresthesia of skin: Secondary | ICD-10-CM | POA: Diagnosis not present

## 2020-01-16 DIAGNOSIS — Z801 Family history of malignant neoplasm of trachea, bronchus and lung: Secondary | ICD-10-CM | POA: Diagnosis not present

## 2020-01-16 DIAGNOSIS — R200 Anesthesia of skin: Secondary | ICD-10-CM

## 2020-01-16 DIAGNOSIS — M7502 Adhesive capsulitis of left shoulder: Secondary | ICD-10-CM

## 2020-01-16 DIAGNOSIS — Z825 Family history of asthma and other chronic lower respiratory diseases: Secondary | ICD-10-CM | POA: Diagnosis not present

## 2020-01-16 DIAGNOSIS — G4700 Insomnia, unspecified: Secondary | ICD-10-CM | POA: Diagnosis not present

## 2020-01-16 DIAGNOSIS — Z87891 Personal history of nicotine dependence: Secondary | ICD-10-CM | POA: Diagnosis not present

## 2020-01-16 DIAGNOSIS — Z79891 Long term (current) use of opiate analgesic: Secondary | ICD-10-CM

## 2020-01-16 DIAGNOSIS — J449 Chronic obstructive pulmonary disease, unspecified: Secondary | ICD-10-CM | POA: Insufficient documentation

## 2020-01-16 DIAGNOSIS — M5412 Radiculopathy, cervical region: Secondary | ICD-10-CM | POA: Diagnosis not present

## 2020-01-16 DIAGNOSIS — M25561 Pain in right knee: Secondary | ICD-10-CM | POA: Diagnosis not present

## 2020-01-16 MED ORDER — HYDROCODONE-ACETAMINOPHEN 7.5-325 MG PO TABS: 1.0000 | ORAL_TABLET | Freq: Every day | ORAL | 0 refills | Status: DC | PRN

## 2020-01-16 NOTE — Progress Notes (Signed)
Subjective:    Patient ID: Cassandra [name], female    DOB: 08-08-1956, 64 y.o.   MRN: [id number]  HPI: Cassandra [name] is a 64 y.o. female who returns for follow up appointment for chronic pain and medication refill. She states her pain is located in her neck radiating into her left shoulder and upper back mainly left side. Also reports a few weeks ago she developed numbness in her head radiating into her upper extremities with loss of function in her upper extremities she states, she didn't seek medical attention. She was instructed to call her PCP regarding the above and neurology consult was placed. Also reports two weeks ago she experienced double vision, she didn't seek medical attention, she had experienced the above in 2016. She was instructed to call her ophthalmologist to schedule an appointment and neurology consult was placed. She verbalizes understanding.    She denies double vision and numbness at this time. She rates her pain 7. Her current exercise regime is walking and performing stretching exercises.  Cassandra [name] Morphine equivalent is 7.50 MME.  Last UDS was Performed  On 11/19/2019, it was consistent.     Pain Inventory Average Pain 7 Pain Right Now 7 My pain is aching  In the last 24 hours, has pain interfered with the following? General activity 9 Relation with others 9 Enjoyment of life 9 What TIME of day is your pain at its worst? morning evening night Sleep (in general) Poor  Pain is worse with: bending, standing and some activites Pain improves with: rest, heat/ice and medication Relief from Meds: 6  Mobility walk without assistance ability to climb steps?  yes do you drive?  yes  Function disabled: date disabled 2007 I need assistance with the following:  household duties and shopping  Neuro/Psych bladder control problems weakness numbness tremor tingling spasms dizziness  Prior Studies Any changes since last visit?  no  Physicians involved in your  care Any changes since last visit?  no   Family History  Problem Relation Age of Onset  . Coronary artery disease Mother   . Diabetes Mother        borderline  . Kidney disease Mother        stage 4  . Coronary artery disease Father   . Prostate cancer Father   . Lung disease Father        black lung  . Liver cancer Maternal Grandmother   . Liver disease Maternal Grandmother   . Polymyalgia rheumatica Sister   . Heart disease Sister   . Coronary artery disease Brother        heart attack  . COPD Brother   . Colon polyps Sister   . Lung cancer Paternal Grandmother   . Colon cancer Other        Both sides  . Early death Son    Social History   Socioeconomic History  . Marital status: Divorced    Spouse name: Not on file  . Number of children: 1  . Years of education: Not on file  . Highest education level: GED or equivalent  Occupational History  . Occupation: Homemaker  Tobacco Use  . Smoking status: Former Smoker    Packs/day: 1.00    Years: 42.00    Pack years: 42.00    Types: Cigarettes    Quit date: 06/01/2015    Years since quitting: 4.6  . Smokeless tobacco: Never Used  Substance and Sexual Activity  . Alcohol use:  No    Alcohol/week: 0.0 standard drinks  . Drug use: No  . Sexual activity: Yes  Other Topics Concern  . Not on file  Social History Narrative   Lives alone.  One child.  Two grands.     Social Determinants of Health   Financial Resource Strain:   . Difficulty of Paying Living Expenses:   Food Insecurity:   . Worried About Charity fundraiser in the Last Year:   . Arboriculturist in the Last Year:   Transportation Needs:   . Film/video editor (Medical):   Marland Kitchen Lack of Transportation (Non-Medical):   Physical Activity:   . Days of Exercise per Week:   . Minutes of Exercise per Session:   Stress:   . Feeling of Stress :   Social Connections:   . Frequency of Communication with Friends and Family:   . Frequency of Social  Gatherings with Friends and Family:   . Attends Religious Services:   . Active Member of Clubs or Organizations:   . Attends Archivist Meetings:   Marland Kitchen Marital Status:    Past Surgical History:  Procedure Laterality Date  . ABDOMINAL HYSTERECTOMY     age 26  . ANAL RECTAL MANOMETRY N/A 12/09/2015   Procedure: ANO RECTAL MANOMETRY;  Surgeon: Mauri Pole, MD;  Location: [facility];  Service: Endoscopy;  Laterality: N/A;  . BLADDER REPAIR     sling/with perforation  . CESAREAN SECTION     1 time  . KNEE ARTHROSCOPY Left   . ruptured uterus    . SHOULDER ARTHROSCOPY     multiple   Past Medical History:  Diagnosis Date  . Anxiety   . Bowel habit changes    excessive gas /frequent BM's with change in shape with pus  . Chronic left shoulder pain    frozen   . Chronic pain    left upper shoulder ,neck and back  . COPD (chronic obstructive pulmonary disease) ([place])    Dr. Redge Gainer  . Depression   . Emphysema lung ([place])   . GERD (gastroesophageal reflux disease)   . Hyperlipidemia   . IBS (irritable bowel syndrome)   . Maternal complication related to childbirth    Sepsis, staph  . Meniere's disease   . Nicotine addiction    2 packs per day   . Skin cancer    Rt. leg, squamous cell  . Tubular adenoma of colon 01/31/2000   Dr. Richmond Campbell  . Weakness of pelvic floor    prolapse -  MRI from 2017   Temp (!) 96.5 F (35.8 C)   Ht 5\' 6"  (1.676 m)   Wt 145 lb 9.6 oz (66 kg)   BMI 23.50 kg/m   Opioid Risk Score:   Fall Risk Score:  `1  Depression screen PHQ 2/9  Depression screen [facility] 2/9 05/17/2019 04/22/2019 12/12/2018 11/26/2018 10/04/2018 06/20/2018 06/19/2018  Decreased Interest 0 0 1 2 1  0 1  Down, Depressed, Hopeless 0 0 1 1 1  0 1  PHQ - 2 Score 0 0 2 3 2  0 2  Altered sleeping - - - 3 2 - -  Tired, decreased energy - - - 3 3 - -  Change in appetite - - - 3 3 - -  Feeling bad or failure about yourself  - - - 0 0 - -  Trouble concentrating - - - 1 1  - -  Moving slowly or fidgety/restless - - -  0 0 - -  Suicidal thoughts - - - 0 0 - -  PHQ-9 Score - - - 13 11 - -  Some recent data might be hidden    Review of Systems  Gastrointestinal: Positive for abdominal pain, constipation and diarrhea.  Neurological: Positive for dizziness, tremors, weakness and numbness.       Tingling spasms  All other systems reviewed and are negative.      Objective:   Physical Exam Vitals and nursing note reviewed.  Constitutional:      Appearance: Normal appearance.  Neck:     Comments: Cervical Paraspinal Tenderness: C-5-C-6 Cardiovascular:     Rate and Rhythm: Normal rate and regular rhythm.     Pulses: Normal pulses.     Heart sounds: Normal heart sounds.  Pulmonary:     Effort: Pulmonary effort is normal.     Breath sounds: Normal breath sounds.  Musculoskeletal:     Cervical back: Normal range of motion and neck supple.     Comments: Normal Muscle Bulk and Muscle Testing Reveals:  Upper Extremities: Right: Full ROM and Muscle Strength 5/5 Left: Decreased ROM 30 Degrees and Muscle Strength 5/5  Thoracic Hypersensitivity: T-1-T-7 Mainly Left Side  Lower Extremities: Full ROM and Muscle Strength 5/5 Arises from Chair with Ease Narrow Based  Gait   Skin:    General: Skin is warm and dry.  Neurological:     Mental Status: She is alert and oriented to person, place, and time.  Psychiatric:        Mood and Affect: Mood normal.        Behavior: Behavior normal.           Assessment & Plan:  1.Post-traumatic arthritis left shoulder s/p 2 shoulder surgeries. Now with adhesive capsulitis left shoulder.01/16/2020. Refilled:Hydrocodone7.5/325mg  one tablet dailyas needed. #30.  We will continue the opioid monitoring program, this consists of regular clinic visits, examinations, urine drug screen, pill counts as well as use of [place] Controlled Substance Reporting System. 2. Cervicalgia,Cervicalspondylosis/ Cervical  Radiculitis: Continue Current Medication Regime.Robaxinand Gabapentindose changed to 100 mg BID, Gabapentin 300 mg discontinued due to daytime drowsiness.Marland Kitchen05/02/2020. 3. Depression/insomnia/anxiety: ContinueTo monitor..Dr. Kelby Fam.01/16/2020. 4. Myofascial Pain: Continue Robaxin. Continue to monitor.01/16/2020 5. Chronic Pain of Knees:No complaints today.Continueto Monitor.01/16/2020. 6. Chronic Bilateral Thoracic Back Pain: Continue HEP as Tolerated. Continue to Alternate Ice and Heat Therapy. Continue current medication regimen.01/16/2020 7. Numbness and Tingling: RX: Neurology Consult 8. Diplopia: Cassandra [name] was instructed to call her opthalmologist and Neurology Consult Placed. She verbalized understanding.   21minutes of face to face patient care time was spent during this visit. All questions were encouraged and answered.   F/U in 2 months

## 2020-01-16 NOTE — Patient Instructions (Addendum)
You seen Dr Laury Axon on : 12/02/2014 Please call for an appointment Call Dr Livia Snellen regarding  you're double vision and numbness and tingling in scalp radiating into you're upper extremities and loss of mobility in you're upper extremities.  I have placed the Neurology Consult Today Truc Ly Elmarie Shiley in [place], [place] in: [facility] Address: [address] [place],  [address] Hours:  Open ? Closes 5PM Phone: [phone] Check insurance info

## 2020-01-17 ENCOUNTER — Encounter: Payer: Self-pay | Admitting: Neurology

## 2020-01-21 DIAGNOSIS — H4921 Sixth [abducent] nerve palsy, right eye: Secondary | ICD-10-CM | POA: Diagnosis not present

## 2020-01-22 ENCOUNTER — Other Ambulatory Visit: Payer: Self-pay

## 2020-01-22 ENCOUNTER — Encounter: Payer: Medicare Other | Admitting: Physical Medicine & Rehabilitation

## 2020-01-22 ENCOUNTER — Encounter: Payer: Self-pay | Admitting: Physical Medicine & Rehabilitation

## 2020-01-22 DIAGNOSIS — Z841 Family history of disorders of kidney and ureter: Secondary | ICD-10-CM | POA: Diagnosis not present

## 2020-01-22 DIAGNOSIS — Z825 Family history of asthma and other chronic lower respiratory diseases: Secondary | ICD-10-CM | POA: Diagnosis not present

## 2020-01-22 DIAGNOSIS — Z8249 Family history of ischemic heart disease and other diseases of the circulatory system: Secondary | ICD-10-CM | POA: Diagnosis not present

## 2020-01-22 DIAGNOSIS — H532 Diplopia: Secondary | ICD-10-CM | POA: Diagnosis not present

## 2020-01-22 DIAGNOSIS — Z87891 Personal history of nicotine dependence: Secondary | ICD-10-CM | POA: Diagnosis not present

## 2020-01-22 DIAGNOSIS — Z5181 Encounter for therapeutic drug level monitoring: Secondary | ICD-10-CM | POA: Diagnosis not present

## 2020-01-22 DIAGNOSIS — M47812 Spondylosis without myelopathy or radiculopathy, cervical region: Secondary | ICD-10-CM | POA: Diagnosis not present

## 2020-01-22 DIAGNOSIS — R202 Paresthesia of skin: Secondary | ICD-10-CM | POA: Diagnosis not present

## 2020-01-22 DIAGNOSIS — M7502 Adhesive capsulitis of left shoulder: Secondary | ICD-10-CM

## 2020-01-22 DIAGNOSIS — M25562 Pain in left knee: Secondary | ICD-10-CM | POA: Diagnosis not present

## 2020-01-22 DIAGNOSIS — Z801 Family history of malignant neoplasm of trachea, bronchus and lung: Secondary | ICD-10-CM | POA: Diagnosis not present

## 2020-01-22 DIAGNOSIS — M19012 Primary osteoarthritis, left shoulder: Secondary | ICD-10-CM | POA: Diagnosis not present

## 2020-01-22 DIAGNOSIS — Z79891 Long term (current) use of opiate analgesic: Secondary | ICD-10-CM | POA: Diagnosis not present

## 2020-01-22 DIAGNOSIS — Z8371 Family history of colonic polyps: Secondary | ICD-10-CM | POA: Diagnosis not present

## 2020-01-22 DIAGNOSIS — M7631 Iliotibial band syndrome, right leg: Secondary | ICD-10-CM | POA: Diagnosis not present

## 2020-01-22 DIAGNOSIS — M25561 Pain in right knee: Secondary | ICD-10-CM | POA: Diagnosis not present

## 2020-01-22 DIAGNOSIS — Z833 Family history of diabetes mellitus: Secondary | ICD-10-CM | POA: Diagnosis not present

## 2020-01-22 DIAGNOSIS — R200 Anesthesia of skin: Secondary | ICD-10-CM | POA: Diagnosis not present

## 2020-01-22 DIAGNOSIS — G4700 Insomnia, unspecified: Secondary | ICD-10-CM | POA: Diagnosis not present

## 2020-01-22 DIAGNOSIS — G8929 Other chronic pain: Secondary | ICD-10-CM | POA: Diagnosis not present

## 2020-01-22 DIAGNOSIS — G894 Chronic pain syndrome: Secondary | ICD-10-CM | POA: Diagnosis not present

## 2020-01-22 MED ORDER — HYDROCODONE-ACETAMINOPHEN 7.5-325 MG PO TABS: 1.0000 | ORAL_TABLET | Freq: Every day | ORAL | 0 refills | Status: DC | PRN

## 2020-01-22 NOTE — Patient Instructions (Addendum)
PLEASE FEEL FREE TO CALL OUR OFFICE WITH ANY PROBLEMS OR QUESTIONS [phone])  IF YOU HAVE PROBLEMS GETTING YOUR MRI THEN CALL ME.

## 2020-01-22 NOTE — Progress Notes (Signed)
Subjective:    Patient ID: Cassandra [name], female    DOB: Mar 04, 1956, 64 y.o.   MRN: [id number]  HPI   Cassandra [name] is here in follow up of her chronic pain. She's had numerous medical issues since I last saw her.  Most recently she has had further issues with diplopia.  She also reported to my nurse practitioner that she developed numbness in her head radiating into her upper extremities and into her lower extremities to a lesser extent, right more than left.  She has had these sensations in the past but they have been more significant recently.  She has ongoing neck pain as well.  I looked back into the chart and she is experience symptoms of diplopia as early as 2010-2011.  MRI in 2011 was read as essentially normal.  MRI of her cervical spine in 2015 did not show critical central or foraminal stenosis.  After she saw my nurse practitioner last week she went to see her ophthalmologist who was supposed to be ordering an MRI.  Also referral was made to [facility] to assess her for these deficits.  She denies any associated headaches.  She denies fatigue afterwards.  Lid the symptoms seem to be self-limiting.  She denies any depression or anxiety and in fact has taken herself off of Xanax and Lexapro because her mood has been improved.  History is also positive for urethral stricture.  She is being followed by urology for this believe she had stenting done.  She is on prophylactic Macrodantin 50 mg at bedtime.  For pain she is taking hydrocodone 1 tablet daily as needed as well as gabapentin 1 mg twice daily. Pain Inventory Average Pain 7 Pain Right Now 7 My pain is sharp, burning, dull, stabbing, tingling and aching  In the last 24 hours, has pain interfered with the following? General activity 7 Relation with others 7 Enjoyment of life 7 What TIME of day is your pain at its worst? morning evening night Sleep (in general) Poor  Pain is worse with: walking, bending, inactivity  and some activites Pain improves with: rest, heat/ice and medication Relief from Meds: 5  Mobility ability to climb steps?  yes do you drive?  yes  Function disabled: date disabled 2007 I need assistance with the following:  meal prep, household duties and shopping  Neuro/Psych bladder control problems bowel control problems weakness numbness tremor tingling spasms dizziness  Prior Studies Any changes since last visit?  no  Physicians involved in your care Eye doctor   Family History  Problem Relation Age of Onset  . Coronary artery disease Mother   . Diabetes Mother        borderline  . Kidney disease Mother        stage 4  . Coronary artery disease Father   . Prostate cancer Father   . Lung disease Father        black lung  . Liver cancer Maternal Grandmother   . Liver disease Maternal Grandmother   . Polymyalgia rheumatica Sister   . Heart disease Sister   . Coronary artery disease Brother        heart attack  . COPD Brother   . Colon polyps Sister   . Lung cancer Paternal Grandmother   . Colon cancer Other        Both sides  . Early death Son    Social History   Socioeconomic History  . Marital status: Divorced  Spouse name: Not on file  . Number of children: 1  . Years of education: Not on file  . Highest education level: GED or equivalent  Occupational History  . Occupation: Homemaker  Tobacco Use  . Smoking status: Former Smoker    Packs/day: 1.00    Years: 42.00    Pack years: 42.00    Types: Cigarettes    Quit date: 06/01/2015    Years since quitting: 4.6  . Smokeless tobacco: Never Used  Substance and Sexual Activity  . Alcohol use: No    Alcohol/week: 0.0 standard drinks  . Drug use: No  . Sexual activity: Yes  Other Topics Concern  . Not on file  Social History Narrative   Lives alone.  One child.  Two grands.     Social Determinants of Health   Financial Resource Strain:   . Difficulty of Paying Living Expenses:     Food Insecurity:   . Worried About Charity fundraiser in the Last Year:   . Arboriculturist in the Last Year:   Transportation Needs:   . Film/video editor (Medical):   Marland Kitchen Lack of Transportation (Non-Medical):   Physical Activity:   . Days of Exercise per Week:   . Minutes of Exercise per Session:   Stress:   . Feeling of Stress :   Social Connections:   . Frequency of Communication with Friends and Family:   . Frequency of Social Gatherings with Friends and Family:   . Attends Religious Services:   . Active Member of Clubs or Organizations:   . Attends Archivist Meetings:   Marland Kitchen Marital Status:    Past Surgical History:  Procedure Laterality Date  . ABDOMINAL HYSTERECTOMY     age 20  . ANAL RECTAL MANOMETRY N/A 12/09/2015   Procedure: ANO RECTAL MANOMETRY;  Surgeon: Mauri Pole, MD;  Location: [facility];  Service: Endoscopy;  Laterality: N/A;  . BLADDER REPAIR     sling/with perforation  . CESAREAN SECTION     1 time  . KNEE ARTHROSCOPY Left   . ruptured uterus    . SHOULDER ARTHROSCOPY     multiple   Past Medical History:  Diagnosis Date  . Anxiety   . Bowel habit changes    excessive gas /frequent BM's with change in shape with pus  . Chronic left shoulder pain    frozen   . Chronic pain    left upper shoulder ,neck and back  . COPD (chronic obstructive pulmonary disease) ([place])    Dr. Redge Gainer  . Depression   . Emphysema lung ([place])   . GERD (gastroesophageal reflux disease)   . Hyperlipidemia   . IBS (irritable bowel syndrome)   . Maternal complication related to childbirth    Sepsis, staph  . Meniere's disease   . Nicotine addiction    2 packs per day   . Skin cancer    Rt. leg, squamous cell  . Tubular adenoma of colon 01/31/2000   Dr. Richmond Campbell  . Weakness of pelvic floor    prolapse -  MRI from 2017   BP 120/71   Pulse 73   Temp 98.5 F (36.9 C)   Ht 5\' 6"  (1.676 m)   Wt 146 lb (66.2 kg)   SpO2 97%   BMI 23.57  kg/m   Opioid Risk Score:   Fall Risk Score:  `1  Depression screen PHQ 2/9  Depression screen [facility] 2/9 05/17/2019  04/22/2019 12/12/2018 11/26/2018 10/04/2018 06/20/2018 06/19/2018  Decreased Interest 0 0 1 2 1  0 1  Down, Depressed, Hopeless 0 0 1 1 1  0 1  PHQ - 2 Score 0 0 2 3 2  0 2  Altered sleeping - - - 3 2 - -  Tired, decreased energy - - - 3 3 - -  Change in appetite - - - 3 3 - -  Feeling bad or failure about yourself  - - - 0 0 - -  Trouble concentrating - - - 1 1 - -  Moving slowly or fidgety/restless - - - 0 0 - -  Suicidal thoughts - - - 0 0 - -  PHQ-9 Score - - - 13 11 - -  Some recent data might be hidden     Review of Systems  Gastrointestinal: Positive for constipation and diarrhea.  Neurological: Positive for dizziness, tremors, weakness and numbness.       Spasms  All other systems reviewed and are negative.      Objective:   Physical Exam Gen: no distress, normal appearing HEENT: oral mucosa pink and moist, NCAT, extraocular eye movements are intact Cardio: Reg rate Chest: normal effort, normal rate of breathing Abd: soft, non-distended Ext: no edema Skin: intact Neuro: alert and oriented. No focal CN abnl. DTR's 2++ all 4's. Good insight and awareness.  No sensory loss  Musculoskeletal: left shoulder without changes.  She remains severely limited with any types of range of motion for the left shoulder. Psych: pleasant, normal affect        Assessment & Plan:  1. Post-traumatic arthritis left shoulder s/p 2 shoulder surgeries. Now with adhesive capsulitis left shoulder and chronic pain 2. Cervicalgia, spondylosis.  3. Right ITB syndrome  4. Depression/insomnia/anxiety  5. IBS 6. Bilateral knee pain, Left > right. ?likely meniscal/soft tissue injury 7. Multiple drug allergies 8. Hx of urinary incontinence, urethral stricture.  9  Recurrent diplopia and episodes of "numbness in her head". Pt has a 10+ year hx of diplopia     Plan:  1.    Pursue MRI  as ordered, recommending MRI with contrast.  Might be worthwhile to look at her neck as well.  -Follow-up with neurology as recommended.  Presentation raises concerns regarding MS or some other type of neurodegenerative disorder.  Reflexes are hyperreflexic throughout.  Question whether she may be having seizures or some sort of migraine event also.  -recommend TFT's, Vitamin B levels 2. Frequent UTI's. Urethral stricture       -Urology  3. Gabapentin,  300mg  TID  5. Knee pain stable at present 6.  Maintain hydrocodone at 7.5/325 one qday prn #30.  Refill today.  We will continue the controlled substance monitoring program, this consists of regular clinic visits, examinations, routine drug screening, pill counts as well as use of [place] Controlled Substance Reporting System. [place] today.  -Medication was refilled and a second prescription was sent to the patient's pharmacy for next month.    .   7. IBS: continue with probiotic.  7. Fatigue: better with improved mood.          8. Lexapro--- patient has discontinued  Fifteen minutes of face to face patient care time were spent during this visit. All questions were encouraged and answered.  Follow up with me or NP in 2 mos .

## 2020-01-24 ENCOUNTER — Ambulatory Visit: Payer: Medicare Other | Admitting: *Deleted

## 2020-01-28 ENCOUNTER — Ambulatory Visit: Payer: Medicare Other | Attending: Internal Medicine

## 2020-01-28 DIAGNOSIS — Z23 Encounter for immunization: Secondary | ICD-10-CM

## 2020-01-28 NOTE — Progress Notes (Signed)
   Covid-19 Vaccination Clinic  Name:  [name]    MRN: [id number] DOB: 01/03/1956  01/28/2020  [name] was observed post Covid-19 immunization for 15 minutes without incident. She was provided with Vaccine Information Sheet and instruction to access the V-Safe system.   [name] was instructed to call 911 with any severe reactions post vaccine: Marland Kitchen Difficulty breathing  . Swelling of face and throat  . A fast heartbeat  . A bad rash all over body  . Dizziness and weakness   Immunizations Administered    Name Date Dose VIS Date Route   Moderna COVID-19 Vaccine 01/28/2020 10:22 AM 0.5 mL 08/2019 Intramuscular   Manufacturer: Moderna   Lot: [id number]   [id number]

## 2020-01-28 NOTE — Progress Notes (Signed)
   Covid-19 Vaccination Clinic  Name:  Cassandra [name]    MRN: [id number] DOB: [birth year]  01/28/2020  [name] was observed post Covid-19 immunization for 15 minutes without incident. She was provided with Vaccine Information Sheet and instruction to access the V-Safe system.   Ms. East was instructed to call 911 with any severe reactions post vaccine: Marland Kitchen Difficulty breathing  . Swelling of face and throat  . A fast heartbeat  . A bad rash all over body  . Dizziness and weakness   Immunizations Administered    Name Date Dose VIS Date Route   Moderna COVID-19 Vaccine 01/28/2020 10:22 AM 0.5 mL 08/2019 Intramuscular   Manufacturer: Moderna   Lot: [id number]   [id number]

## 2020-01-31 ENCOUNTER — Other Ambulatory Visit: Payer: Self-pay | Admitting: Ophthalmology

## 2020-01-31 DIAGNOSIS — H532 Diplopia: Secondary | ICD-10-CM

## 2020-02-05 ENCOUNTER — Ambulatory Visit: Payer: Medicare Other | Admitting: Physical Medicine & Rehabilitation

## 2020-02-06 ENCOUNTER — Other Ambulatory Visit: Payer: Self-pay

## 2020-02-06 ENCOUNTER — Encounter: Payer: Self-pay | Admitting: Family Medicine

## 2020-02-06 ENCOUNTER — Ambulatory Visit (INDEPENDENT_AMBULATORY_CARE_PROVIDER_SITE_OTHER): Payer: Medicare Other | Admitting: Family Medicine

## 2020-02-06 VITALS — BP 130/74 | HR 86 | Temp 97.2°F | Resp 20 | Ht 66.0 in | Wt 145.0 lb

## 2020-02-06 DIAGNOSIS — R202 Paresthesia of skin: Secondary | ICD-10-CM | POA: Diagnosis not present

## 2020-02-11 ENCOUNTER — Encounter: Payer: Self-pay | Admitting: Family Medicine

## 2020-02-11 NOTE — Progress Notes (Signed)
Subjective:  Patient ID: Cassandra [name], female    DOB: 1956/04/25  Age: 64 y.o. MRN: [id number]  CC: No chief complaint on file.   HPI Cassandra [name] presents for cold sensation running from her brain down her right side down her arms and into to the fingers.  They feel stiff and cold and she cannot lift her arms.  Depression screen [facility] 2/9 02/06/2020 05/17/2019 04/22/2019  Decreased Interest 0 0 0  Down, Depressed, Hopeless 0 0 0  PHQ - 2 Score 0 0 0  Altered sleeping - - -  Tired, decreased energy - - -  Change in appetite - - -  Feeling bad or failure about yourself  - - -  Trouble concentrating - - -  Moving slowly or fidgety/restless - - -  Suicidal thoughts - - -  PHQ-9 Score - - -  Some recent data might be hidden    History Cassandra [name] has a past medical history of Anxiety, Bowel habit changes, Chronic left shoulder pain, Chronic pain, COPD (chronic obstructive pulmonary disease) (HCC), Depression, Emphysema lung (HCC), GERD (gastroesophageal reflux disease), Hyperlipidemia, IBS (irritable bowel syndrome), Maternal complication related to childbirth, Meniere's disease, Nicotine addiction, Skin cancer, Tubular adenoma of colon (01/31/2000), and Weakness of pelvic floor.   She has a past surgical history that includes Bladder repair; Shoulder arthroscopy; Abdominal hysterectomy; Knee arthroscopy (Left); Cesarean section; ruptured uterus; and Anal Rectal manometry (N/A, 12/09/2015).   Her family history includes COPD in her brother; Colon cancer in an other family member; Colon polyps in her sister; Coronary artery disease in her brother, father, and mother; Diabetes in her mother; Early death in her son; Heart disease in her sister; Kidney disease in her mother; Liver cancer in her maternal grandmother; Liver disease in her maternal grandmother; Lung cancer in her paternal grandmother; Lung disease in her father; Polymyalgia rheumatica in her sister; Prostate cancer in her father.She reports  that she quit smoking about 4 years ago. Her smoking use included cigarettes. She has a 42.00 pack-year smoking history. She has never used smokeless tobacco. She reports that she does not drink alcohol or use drugs.    ROS Review of Systems  Constitutional: Negative.   HENT: Negative.   Eyes: Negative for visual disturbance.  Respiratory: Negative for shortness of breath.   Cardiovascular: Negative for chest pain.  Gastrointestinal: Negative for abdominal pain.  Musculoskeletal: Negative for arthralgias.    Objective:  BP 130/74   Pulse 86   Temp (!) 97.2 F (36.2 C) (Temporal)   Resp 20   Ht 5\' 6"  (1.676 m)   Wt 145 lb (65.8 kg)   SpO2 98%   BMI 23.40 kg/m   BP Readings from Last 3 Encounters:  02/06/20 130/74  01/22/20 120/71  01/16/20 112/71    Wt Readings from Last 3 Encounters:  02/06/20 145 lb (65.8 kg)  01/22/20 146 lb (66.2 kg)  01/16/20 145 lb 9.6 oz (66 kg)     Physical Exam Constitutional:      General: She is not in acute distress.    Appearance: She is well-developed.  Cardiovascular:     Rate and Rhythm: Normal rate and regular rhythm.  Pulmonary:     Breath sounds: Normal breath sounds.  Skin:    General: Skin is warm and dry.  Neurological:     Mental Status: She is alert and oriented to person, place, and time.       Assessment & Plan:  Diagnoses and all orders for this visit:  Paresthesias       I have discontinued Cassandra [name]. Cassandra "[name]"'s chlorhexidine, nitrofurantoin (macrocrystal-monohydrate), Myrbetriq, and nitrofurantoin. I am also having her maintain her conjugated estrogens, hydroxypropyl methylcellulose / hypromellose, cholecalciferol, ondansetron, fexofenadine, meclizine, methocarbamol, gabapentin, and HYDROcodone-acetaminophen.  Allergies as of 02/06/2020      Reactions   Chantix [varenicline] Rash   Codeine Itching, Nausea And Vomiting   Cymbalta [duloxetine Hcl] Nausea Only   Livalo [pitavastatin] Other (See  Comments)   Seizures    Metoprolol Other (See Comments)   Had episode of possible hypotension or seizure described and muscles clamping down and searing pain toe to head and near fall x2. Loss of bowel control. No further symptoms after stopped.   Morphine And Related Hives   Itching hive rash   Oxycodone-aspirin Nausea And Vomiting   Oxycontin [oxycodone Hcl] Other (See Comments)   Altered mental status   Propoxyphene N-acetaminophen Nausea And Vomiting   Remeron [mirtazapine]    Shakes/tremors   Trazodone And Nefazodone Other (See Comments)   Medication is too strong   Zithromax [azithromycin] Other (See Comments)   Unknown    Other Rash   Lipturzet caused enlarged veins in legs   Oxycodone-acetaminophen Hives, Nausea And Vomiting, Rash   Septra [sulfamethoxazole-trimethoprim] Rash      Medication List       Accurate as of Feb 06, 2020 11:59 PM. If you have any questions, ask your nurse or doctor.        STOP taking these medications   chlorhexidine 4 % external liquid Commonly known as: Hibiclens Stopped by: [name], MD   Myrbetriq 50 MG Tb24 tablet Generic drug: mirabegron ER Stopped by: [name], MD   nitrofurantoin (macrocrystal-monohydrate) 100 MG capsule Commonly known as: Macrobid Stopped by: [name], MD   nitrofurantoin 50 MG capsule Commonly known as: MACRODANTIN Stopped by: [name], MD     TAKE these medications   cholecalciferol 1000 units tablet Commonly known as: VITAMIN D Take 1,000 Units by mouth daily.   conjugated estrogens vaginal cream Commonly known as: PREMARIN Place vaginally daily. What changed:   how much to take  when to take this  reasons to take this   fexofenadine 180 MG tablet Commonly known as: ALLEGRA Take 1 tablet (180 mg total) by mouth daily as needed for allergies.   gabapentin 100 MG capsule Commonly known as: Neurontin Take 1 capsule (100 mg total) by mouth 2 (two) times daily.     HYDROcodone-acetaminophen 7.5-325 MG tablet Commonly known as: NORCO Take 1 tablet by mouth daily as needed for moderate pain.   hydroxypropyl methylcellulose / hypromellose 2.5 % ophthalmic solution Commonly known as: ISOPTO TEARS / GONIOVISC Place 1 drop into both eyes as needed for dry eyes.   meclizine 25 MG tablet Commonly known as: ANTIVERT TAKE 1 TABLET BY MOUTH TWO  TIMES DAILY AS NEEDED What changed:   how much to take  how to take this  when to take this  reasons to take this  additional instructions   methocarbamol 500 MG tablet Commonly known as: ROBAXIN TAKE 1 TABLET BY MOUTH TWICE A DAY What changed: when to take this   ondansetron 8 MG disintegrating tablet Commonly known as: ZOFRAN-ODT Take 1 tablet (8 mg total) by mouth every 6 (six) hours as needed for nausea or vomiting.        Follow-up: Return in about 3 months (around 05/08/2020).  [name], M.D.

## 2020-02-27 ENCOUNTER — Other Ambulatory Visit: Payer: Self-pay | Admitting: Physical Medicine & Rehabilitation

## 2020-03-04 ENCOUNTER — Ambulatory Visit
Admission: RE | Admit: 2020-03-04 | Discharge: 2020-03-04 | Disposition: A | Discharge status: Home / Self Care | Payer: Medicare Other | Source: Ambulatory Visit | Attending: Ophthalmology | Admitting: Ophthalmology

## 2020-03-04 ENCOUNTER — Other Ambulatory Visit: Payer: Self-pay

## 2020-03-04 DIAGNOSIS — H532 Diplopia: Secondary | ICD-10-CM

## 2020-03-04 MED ORDER — GADOBENATE DIMEGLUMINE 529 MG/ML IV SOLN
13.0000 mL | Freq: Once | INTRAVENOUS | Status: AC | PRN
  Administered 2020-03-04: 13 mL via INTRAVENOUS

## 2020-03-17 DIAGNOSIS — E0500 Thyrotoxicosis with diffuse goiter without thyrotoxic crisis or storm: Secondary | ICD-10-CM | POA: Diagnosis not present

## 2020-03-26 ENCOUNTER — Encounter: Payer: Medicare Other | Admitting: Registered Nurse

## 2020-03-26 ENCOUNTER — Encounter: Payer: Medicare Other | Attending: Physical Medicine & Rehabilitation | Admitting: Registered Nurse

## 2020-03-26 ENCOUNTER — Other Ambulatory Visit: Payer: Self-pay

## 2020-03-26 VITALS — BP 127/74 | HR 79 | Temp 98.0°F | Ht 66.0 in | Wt 145.8 lb

## 2020-03-26 DIAGNOSIS — Z841 Family history of disorders of kidney and ureter: Secondary | ICD-10-CM | POA: Diagnosis not present

## 2020-03-26 DIAGNOSIS — Z79891 Long term (current) use of opiate analgesic: Secondary | ICD-10-CM

## 2020-03-26 DIAGNOSIS — Z8371 Family history of colonic polyps: Secondary | ICD-10-CM | POA: Insufficient documentation

## 2020-03-26 DIAGNOSIS — M25561 Pain in right knee: Secondary | ICD-10-CM | POA: Insufficient documentation

## 2020-03-26 DIAGNOSIS — Z8249 Family history of ischemic heart disease and other diseases of the circulatory system: Secondary | ICD-10-CM | POA: Diagnosis not present

## 2020-03-26 DIAGNOSIS — M25562 Pain in left knee: Secondary | ICD-10-CM | POA: Insufficient documentation

## 2020-03-26 DIAGNOSIS — Z833 Family history of diabetes mellitus: Secondary | ICD-10-CM | POA: Insufficient documentation

## 2020-03-26 DIAGNOSIS — Z87891 Personal history of nicotine dependence: Secondary | ICD-10-CM | POA: Insufficient documentation

## 2020-03-26 DIAGNOSIS — R202 Paresthesia of skin: Secondary | ICD-10-CM | POA: Insufficient documentation

## 2020-03-26 DIAGNOSIS — J449 Chronic obstructive pulmonary disease, unspecified: Secondary | ICD-10-CM | POA: Insufficient documentation

## 2020-03-26 DIAGNOSIS — F419 Anxiety disorder, unspecified: Secondary | ICD-10-CM | POA: Diagnosis not present

## 2020-03-26 DIAGNOSIS — Z825 Family history of asthma and other chronic lower respiratory diseases: Secondary | ICD-10-CM | POA: Insufficient documentation

## 2020-03-26 DIAGNOSIS — R200 Anesthesia of skin: Secondary | ICD-10-CM | POA: Diagnosis not present

## 2020-03-26 DIAGNOSIS — F329 Major depressive disorder, single episode, unspecified: Secondary | ICD-10-CM | POA: Diagnosis not present

## 2020-03-26 DIAGNOSIS — M7502 Adhesive capsulitis of left shoulder: Secondary | ICD-10-CM | POA: Diagnosis not present

## 2020-03-26 DIAGNOSIS — M19012 Primary osteoarthritis, left shoulder: Secondary | ICD-10-CM | POA: Diagnosis not present

## 2020-03-26 DIAGNOSIS — M7631 Iliotibial band syndrome, right leg: Secondary | ICD-10-CM | POA: Insufficient documentation

## 2020-03-26 DIAGNOSIS — G8929 Other chronic pain: Secondary | ICD-10-CM | POA: Diagnosis not present

## 2020-03-26 DIAGNOSIS — G894 Chronic pain syndrome: Secondary | ICD-10-CM

## 2020-03-26 DIAGNOSIS — Z801 Family history of malignant neoplasm of trachea, bronchus and lung: Secondary | ICD-10-CM | POA: Diagnosis not present

## 2020-03-26 DIAGNOSIS — M47812 Spondylosis without myelopathy or radiculopathy, cervical region: Secondary | ICD-10-CM

## 2020-03-26 DIAGNOSIS — M542 Cervicalgia: Secondary | ICD-10-CM

## 2020-03-26 DIAGNOSIS — G4700 Insomnia, unspecified: Secondary | ICD-10-CM | POA: Insufficient documentation

## 2020-03-26 DIAGNOSIS — Z5181 Encounter for therapeutic drug level monitoring: Secondary | ICD-10-CM

## 2020-03-26 DIAGNOSIS — H532 Diplopia: Secondary | ICD-10-CM | POA: Insufficient documentation

## 2020-03-26 DIAGNOSIS — K219 Gastro-esophageal reflux disease without esophagitis: Secondary | ICD-10-CM | POA: Insufficient documentation

## 2020-03-26 DIAGNOSIS — M546 Pain in thoracic spine: Secondary | ICD-10-CM | POA: Diagnosis not present

## 2020-03-26 DIAGNOSIS — Z90710 Acquired absence of both cervix and uterus: Secondary | ICD-10-CM | POA: Insufficient documentation

## 2020-03-26 DIAGNOSIS — M5412 Radiculopathy, cervical region: Secondary | ICD-10-CM | POA: Diagnosis not present

## 2020-03-26 DIAGNOSIS — Z8042 Family history of malignant neoplasm of prostate: Secondary | ICD-10-CM | POA: Insufficient documentation

## 2020-03-26 DIAGNOSIS — Z85828 Personal history of other malignant neoplasm of skin: Secondary | ICD-10-CM | POA: Insufficient documentation

## 2020-03-26 DIAGNOSIS — E785 Hyperlipidemia, unspecified: Secondary | ICD-10-CM | POA: Insufficient documentation

## 2020-03-26 MED ORDER — HYDROCODONE-ACETAMINOPHEN 7.5-325 MG PO TABS: 1.0000 | ORAL_TABLET | Freq: Every day | ORAL | 0 refills | Status: DC | PRN

## 2020-03-26 NOTE — Progress Notes (Signed)
Subjective:    Patient ID: Cassandra [name], female    DOB: Feb 06, 1956, 64 y.o.   MRN: [id number]  HPI: Cassandra [name] is a 64 y.o. female who returns for follow up appointment for chronic pain and medication refill. She states her pain is located in her neck radiating into her left shoulder, left arm, left hand with tingling and burning. Also reports right shoulder pain occasionally and bilateral knee pain. She rates her pain 7. Her current exercise regime is she's attending [employer] weekly at the [employer]. walking and performing stretching exercises.  Cassandra [name] states at times she feels as though she has electrical current that flows from her head into her entire body. Cassandra [name] denies any paresthesia at this time and denies any double vision. Cassandra [name] has an appointment scheduled with Neurology on 04/08/2020 with Dr Tomi Likens.  Cassandra [name] Morphine equivalent is 7.50 MME.    Last UDS was Performed on 11/19/2019, it was consistent.   Pain Inventory Average Pain 7 Pain Right Now 7 My pain is sharp, burning, dull, stabbing, tingling and aching  In the last 24 hours, has pain interfered with the following? General activity 7 Relation with others 7 Enjoyment of life 7 What TIME of day is your pain at its worst? morning night Sleep (in general) Poor  Pain is worse with: walking, bending, inactivity and some activites Pain improves with: rest, heat/ice, pacing activities and medication Relief from Meds: 5  Mobility ability to climb steps?  yes do you drive?  yes  Function disabled: date disabled 2007 I need assistance with the following:  meal prep, household duties and shopping  Neuro/Psych bladder control problems bowel control problems weakness numbness tremor tingling spasms dizziness  Prior Studies Any changes since last visit?  no CT/MRI  Physicians involved in your care Any changes since last visit?  no   Family History  Problem Relation Age of Onset  .  Coronary artery disease Mother   . Diabetes Mother        borderline  . Kidney disease Mother        stage 4  . Coronary artery disease Father   . Prostate cancer Father   . Lung disease Father        black lung  . Liver cancer Maternal Grandmother   . Liver disease Maternal Grandmother   . Polymyalgia rheumatica Sister   . Heart disease Sister   . Coronary artery disease Brother        heart attack  . COPD Brother   . Colon polyps Sister   . Lung cancer Paternal Grandmother   . Colon cancer Other        Both sides  . Early death Son    Social History   Socioeconomic History  . Marital status: Divorced    Spouse name: Not on file  . Number of children: 1  . Years of education: Not on file  . Highest education level: GED or equivalent  Occupational History  . Occupation: Homemaker  Tobacco Use  . Smoking status: Former Smoker    Packs/day: 1.00    Years: 42.00    Pack years: 42.00    Types: Cigarettes    Quit date: 06/01/2015    Years since quitting: 4.8  . Smokeless tobacco: Never Used  Vaping Use  . Vaping Use: Never used  Substance and Sexual Activity  . Alcohol use: No    Alcohol/week: 0.0 standard drinks  .  Drug use: No  . Sexual activity: Yes  Other Topics Concern  . Not on file  Social History Narrative   Lives alone.  One child.  Two grands.     Social Determinants of Health   Financial Resource Strain:   . Difficulty of Paying Living Expenses:   Food Insecurity:   . Worried About Charity fundraiser in the Last Year:   . Arboriculturist in the Last Year:   Transportation Needs:   . Film/video editor (Medical):   Marland Kitchen Lack of Transportation (Non-Medical):   Physical Activity:   . Days of Exercise per Week:   . Minutes of Exercise per Session:   Stress:   . Feeling of Stress :   Social Connections:   . Frequency of Communication with Friends and Family:   . Frequency of Social Gatherings with Friends and Family:   . Attends Religious  Services:   . Active Member of Clubs or Organizations:   . Attends Archivist Meetings:   Marland Kitchen Marital Status:    Past Surgical History:  Procedure Laterality Date  . ABDOMINAL HYSTERECTOMY     age 26  . ANAL RECTAL MANOMETRY N/A 12/09/2015   Procedure: ANO RECTAL MANOMETRY;  Surgeon: Mauri Pole, MD;  Location: [facility];  Service: Endoscopy;  Laterality: N/A;  . BLADDER REPAIR     sling/with perforation  . CESAREAN SECTION     1 time  . KNEE ARTHROSCOPY Left   . ruptured uterus    . SHOULDER ARTHROSCOPY     multiple   Past Medical History:  Diagnosis Date  . Anxiety   . Bowel habit changes    excessive gas /frequent BM's with change in shape with pus  . Chronic left shoulder pain    frozen   . Chronic pain    left upper shoulder ,neck and back  . COPD (chronic obstructive pulmonary disease) ([place])    Dr. Redge Gainer  . Depression   . Emphysema lung ([place])   . GERD (gastroesophageal reflux disease)   . Hyperlipidemia   . IBS (irritable bowel syndrome)   . Maternal complication related to childbirth    Sepsis, staph  . Meniere's disease   . Nicotine addiction    2 packs per day   . Skin cancer    Rt. leg, squamous cell  . Tubular adenoma of colon 01/31/2000   Dr. Richmond Campbell  . Weakness of pelvic floor    prolapse -  MRI from 2017   BP 127/74   Pulse 79   Temp 98 F (36.7 C)   Ht 5\' 6"  (1.676 m)   Wt 145 lb 12.8 oz (66.1 kg)   SpO2 97%   BMI 23.53 kg/m   Opioid Risk Score:   Fall Risk Score:  `1  Depression screen PHQ 2/9  Depression screen [facility] 2/9 02/06/2020 05/17/2019 04/22/2019 12/12/2018 11/26/2018 10/04/2018 06/20/2018  Decreased Interest 0 0 0 1 2 1  0  Down, Depressed, Hopeless 0 0 0 1 1 1  0  PHQ - 2 Score 0 0 0 2 3 2  0  Altered sleeping - - - - 3 2 -  Tired, decreased energy - - - - 3 3 -  Change in appetite - - - - 3 3 -  Feeling bad or failure about yourself  - - - - 0 0 -  Trouble concentrating - - - - 1 1 -  Moving slowly or  fidgety/restless - - - -  0 0 -  Suicidal thoughts - - - - 0 0 -  PHQ-9 Score - - - - 13 11 -  Some recent data might be hidden    Review of Systems  Musculoskeletal:       Spasms  Neurological: Positive for dizziness, tremors, weakness and numbness.       Tingling  All other systems reviewed and are negative.      Objective:   Physical Exam Vitals and nursing note reviewed.  Constitutional:      Appearance: Normal appearance.  Cardiovascular:     Rate and Rhythm: Normal rate and regular rhythm.     Pulses: Normal pulses.     Heart sounds: Normal heart sounds.  Pulmonary:     Effort: Pulmonary effort is normal.     Breath sounds: Normal breath sounds.  Musculoskeletal:     Cervical back: Normal range of motion and neck supple.     Comments: Normal Muscle Bulk and Muscle Testing Reveals:  Upper Extremities: Right: Full ROM and Muscle Strength 5/5 Left Lower Extremity: Decreased ROM 20 Degrees and Muscle Strength 4/5  Thoracic Hypersensitivity: T-1- T-7 Mainly Left Side  Lower Extremities" Full ROM and Muscle Strength 5/5 Arises from Table with Ease Narrow Based Gait   Skin:    General: Skin is warm and dry.  Neurological:     Mental Status: She is alert and oriented to person, place, and time.  Psychiatric:        Mood and Affect: Mood normal.        Behavior: Behavior normal.           Assessment & Plan:  1.Post-traumatic arthritis left shoulder s/p 2 shoulder surgeries. Now with adhesive capsulitis left shoulder.03/26/2020. Refilled:Hydrocodone7.5/325mg  one tablet dailyas needed. #30.  We will continue the opioid monitoring program, this consists of regular clinic visits, examinations, urine drug screen, pill counts as well as use of [place] Controlled Substance Reporting System. 2. Cervicalgia,Cervicalspondylosis/ Cervical Radiculitis: Continue Current Medication Regime.Robaxinand Gabapentindose changed to 100 mg BID, Gabapentin 300 mg discontinued  due to daytime drowsiness.Marland Kitchen07/15/2021. 3. Depression/insomnia/anxiety: ContinueTo monitor..Dr. Stacksfollowing.03/26/2020. 4. Myofascial Pain: Continue Robaxin. Continue to monitor.03/26/2020 5. Chronic Pain of Knees:Continue HEP as Tolerated. Continue to Monitor. 03/26/2020. 6. Chronic Left- Sided Thoracic Back Pain: Continue HEP as Tolerated. Continue to Alternate Ice and Heat Therapy. Continue current medication regimen.03/26/2020 7. Numbness and Tingling/ Paresthesia: RX: She has a scheduled appointment with Neurology. Continue to Monitor. 03/26/2020 8. Diplopia: No complaints today.  Ophthalmology following and she has an appointment with Neurology. Continue to monitor. 03/26/2020.  76minutes of face to face patient care time was spent during this visit. All questions were encouraged and answered.   F/U in 2 months

## 2020-03-28 ENCOUNTER — Encounter: Payer: Self-pay | Admitting: Registered Nurse

## 2020-04-07 NOTE — Progress Notes (Signed)
NEUROLOGY CONSULTATION NOTE  Cassandra [name] MRN: [id number] DOB: [birth year]  Referring provider: [name], MD Primary care provider: [name], MD  Reason for consult:  Diplopia, numbness  HISTORY OF PRESENT ILLNESS: Cassandra [name] is a 64 year old right-handed female with chronic pain secondary to cervical spondylosis and post-traumatic arthritis of left shoulder and Meniere's disease who presents for episode of numbness and diplopia.  History supplemented by PM&R notes.   About 6 months ago, she began experiencing recurrent transient symptoms: 1.  Mild dull occipital headache for a few seconds. 2.  She may develop a cold sensation from her head down both arms with associated bilateral arm heaviness. 3.  Sometimes when she sits down hard with jarring motion, she feels electricity throughout her body. 4.  Horizontal diplopia when looking right.  Similar episode in February 2011.  MRI of brain at that time was unremarkable.  Her ophthalmologist recently checked labs and stated that the only abnormality was her thyroid hormone, which may be the cause of the diplopia.  All of these episodes last just a few seconds.  They are infrequent and the double vision only occurred once.  MRI of brain with and without contrast on 03/04/2020 personally reviewed showed few nonspecific non-enhancing T2 hyperintense foci within the cerebral white matter but otherwise unremarkable.  She has chronic left shoulder pain related to post-traumatic arthritis and has undergone multiple surgeries.  She has known cervical spondylosis.  MRI of cervical spine from 07/09/2014 personally reviewed and demonstrated disc degeneration and spondylosis causing mild bilateral spinal stenosis and foraminal stenosis at C5-6 and C6-7.   PAST MEDICAL HISTORY: Past Medical History:  Diagnosis Date  . Anxiety   . Bowel habit changes    excessive gas /frequent BM's with change in shape with pus  . Chronic left shoulder  pain    frozen   . Chronic pain    left upper shoulder ,neck and back  . COPD (chronic obstructive pulmonary disease) ([place])    Dr. Redge Gainer  . Depression   . Emphysema lung ([place])   . GERD (gastroesophageal reflux disease)   . Hyperlipidemia   . IBS (irritable bowel syndrome)   . Maternal complication related to childbirth    Sepsis, staph  . Meniere's disease   . Nicotine addiction    2 packs per day   . Skin cancer    Rt. leg, squamous cell  . Tubular adenoma of colon 01/31/2000   Dr. Richmond Campbell  . Weakness of pelvic floor    prolapse -  MRI from 2017    PAST SURGICAL HISTORY: Past Surgical History:  Procedure Laterality Date  . ABDOMINAL HYSTERECTOMY     age 30  . ANAL RECTAL MANOMETRY N/A 12/09/2015   Procedure: ANO RECTAL MANOMETRY;  Surgeon: Mauri Pole, MD;  Location: [facility];  Service: Endoscopy;  Laterality: N/A;  . BLADDER REPAIR     sling/with perforation  . CESAREAN SECTION     1 time  . KNEE ARTHROSCOPY Left   . ruptured uterus    . SHOULDER ARTHROSCOPY     multiple    MEDICATIONS: Current Outpatient Medications on File Prior to Visit  Medication Sig Dispense Refill  . cholecalciferol (VITAMIN D) 1000 units tablet Take 1,000 Units by mouth daily.    Marland Kitchen conjugated estrogens (PREMARIN) vaginal cream Place vaginally daily. (Patient taking differently: Place 1 Applicatorful vaginally daily as needed. ) 42.5 g 5  . fexofenadine (ALLEGRA) 180 MG  tablet Take 1 tablet (180 mg total) by mouth daily as needed for allergies. 90 tablet 3  . gabapentin (NEURONTIN) 100 MG capsule Take 1 capsule (100 mg total) by mouth 2 (two) times daily. 60 capsule 3  . HYDROcodone-acetaminophen (NORCO) 7.5-325 MG tablet Take 1 tablet by mouth daily as needed for moderate pain. 30 tablet 0  . hydroxypropyl methylcellulose / hypromellose (ISOPTO TEARS / GONIOVISC) 2.5 % ophthalmic solution Place 1 drop into both eyes as needed for dry eyes.    . meclizine (ANTIVERT) 25  MG tablet TAKE 1 TABLET BY MOUTH TWO  TIMES DAILY AS NEEDED (Patient taking differently: Take 25 mg by mouth 2 (two) times daily as needed for dizziness or nausea. ) 60 tablet 2  . methocarbamol (ROBAXIN) 500 MG tablet TAKE 1 TABLET BY MOUTH TWICE A DAY (Patient taking differently: Take 500 mg by mouth 2 (two) times a day. ) 60 tablet 5  . ondansetron (ZOFRAN-ODT) 8 MG disintegrating tablet Take 1 tablet (8 mg total) by mouth every 6 (six) hours as needed for nausea or vomiting. 30 tablet 6   No current facility-administered medications on file prior to visit.    ALLERGIES: Allergies  Allergen Reactions  . Chantix [Varenicline] Rash  . Codeine Itching and Nausea And Vomiting  . Cymbalta [Duloxetine Hcl] Nausea Only  . Livalo [Pitavastatin] Other (See Comments)    Seizures   . Metoprolol Other (See Comments)    Had episode of possible hypotension or seizure described and muscles clamping down and searing pain toe to head and near fall x2. Loss of bowel control. No further symptoms after stopped.  . Morphine And Related Hives    Itching hive rash  . Oxycodone-Aspirin Nausea And Vomiting  . Oxycontin [Oxycodone Hcl] Other (See Comments)    Altered mental status  . Propoxyphene N-Acetaminophen Nausea And Vomiting  . Remeron [Mirtazapine]     Shakes/tremors  . Trazodone And Nefazodone Other (See Comments)    Medication is too strong  . Zithromax [Azithromycin] Other (See Comments)    Unknown   . Other Rash    Lipturzet caused enlarged veins in legs  . Oxycodone-Acetaminophen Hives, Nausea And Vomiting and Rash  . Septra [Sulfamethoxazole-Trimethoprim] Rash    FAMILY HISTORY: Family History  Problem Relation Age of Onset  . Coronary artery disease Mother   . Diabetes Mother        borderline  . Kidney disease Mother        stage 4  . Coronary artery disease Father   . Prostate cancer Father   . Lung disease Father        black lung  . Liver cancer Maternal Grandmother   .  Liver disease Maternal Grandmother   . Polymyalgia rheumatica Sister   . Heart disease Sister   . Coronary artery disease Brother        heart attack  . COPD Brother   . Colon polyps Sister   . Lung cancer Paternal Grandmother   . Colon cancer Other        Both sides  . Early death Son    SOCIAL HISTORY: Social History   Socioeconomic History  . Marital status: Divorced    Spouse name: Not on file  . Number of children: 1  . Years of education: Not on file  . Highest education level: GED or equivalent  Occupational History  . Occupation: Homemaker  Tobacco Use  . Smoking status: Former Smoker    Packs/day:  1.00    Years: 42.00    Pack years: 42.00    Types: Cigarettes    Quit date: 06/01/2015    Years since quitting: 4.8  . Smokeless tobacco: Never Used  Vaping Use  . Vaping Use: Never used  Substance and Sexual Activity  . Alcohol use: No    Alcohol/week: 0.0 standard drinks  . Drug use: No  . Sexual activity: Yes  Other Topics Concern  . Not on file  Social History Narrative   Lives alone.  One child.  Two grands.     Social Determinants of Health   Financial Resource Strain:   . Difficulty of Paying Living Expenses:   Food Insecurity:   . Worried About Charity fundraiser in the Last Year:   . Arboriculturist in the Last Year:   Transportation Needs:   . Film/video editor (Medical):   Marland Kitchen Lack of Transportation (Non-Medical):   Physical Activity:   . Days of Exercise per Week:   . Minutes of Exercise per Session:   Stress:   . Feeling of Stress :   Social Connections:   . Frequency of Communication with Friends and Family:   . Frequency of Social Gatherings with Friends and Family:   . Attends Religious Services:   . Active Member of Clubs or Organizations:   . Attends Archivist Meetings:   Marland Kitchen Marital Status:   Intimate Partner Violence:   . Fear of Current or Ex-Partner:   . Emotionally Abused:   Marland Kitchen Physically Abused:   . Sexually  Abused:     PHYSICAL EXAM: Blood pressure 111/71, pulse 76, height 5\' 6"  (1.676 m), weight 146 lb 3.2 oz (66.3 kg), SpO2 96 %. General: No acute distress.  Patient appears well-groomed.   Head:  Normocephalic/atraumatic Eyes:  fundi examined but not visualized Neck: supple, no paraspinal tenderness, full range of motion Back: No paraspinal tenderness Heart: regular rate and rhythm Lungs: Clear to auscultation bilaterally. Vascular: No carotid bruits. Neurological Exam: Mental status: alert and oriented to person, place, and time, recent and remote memory intact, fund of knowledge intact, attention and concentration intact, speech fluent and not dysarthric, language intact. Cranial nerves: CN I: not tested CN II: pupils equal, round and reactive to light, visual fields intact CN III, IV, VI:  full range of motion, no nystagmus, no ptosis CN V: facial sensation intact CN VII: upper and lower face symmetric CN VIII: hearing intact CN IX, X: gag intact, uvula midline CN XI: sternocleidomastoid and trapezius muscles intact CN XII: tongue midline Bulk & Tone: normal, no fasciculations. Motor:  5/5 throughout  Sensation:  Pinprick and vibration sensation intact.   Deep Tendon Reflexes:  3+ throughout, toes downgoing.   Finger to nose testing:  Without dysmetria.   Heel to shin:  Without dysmetria.   Gait:  Normal station and stride.  Able to turn and tandem walk. Romberg negative.  IMPRESSION: 1.  Recurrent brief occipital headache, numbness paresthesias radiating down head into both arms with associated arm weakness, electric pain.   Given history of cervical spondylosis, would want to evaluate for cervical spinal stenosis with myelopathy. 2.  Transient diplopia.  PLAN: 1.  MRI of cervical spine 2.  Will obtain labs from her ophthalmologist to determine if further testing is warranted.  Reportedly thyroid was slightly abnormal.  Will check to see if Myasthenia gravis panel was  checked. 3.  Further recommendations pending results.  Thank you for  allowing me to take part in the care of this patient.  Metta Clines, DO  CC:  [name], MD

## 2020-04-08 ENCOUNTER — Other Ambulatory Visit: Payer: Self-pay

## 2020-04-08 ENCOUNTER — Ambulatory Visit: Payer: Medicare Other | Admitting: Neurology

## 2020-04-08 ENCOUNTER — Encounter: Payer: Self-pay | Admitting: Neurology

## 2020-04-08 VITALS — BP 111/71 | HR 76 | Ht 66.0 in | Wt 146.2 lb

## 2020-04-08 DIAGNOSIS — R200 Anesthesia of skin: Secondary | ICD-10-CM | POA: Diagnosis not present

## 2020-04-08 DIAGNOSIS — M47812 Spondylosis without myelopathy or radiculopathy, cervical region: Secondary | ICD-10-CM

## 2020-04-08 DIAGNOSIS — R519 Headache, unspecified: Secondary | ICD-10-CM | POA: Diagnosis not present

## 2020-04-08 DIAGNOSIS — H532 Diplopia: Secondary | ICD-10-CM

## 2020-04-08 DIAGNOSIS — R29898 Other symptoms and signs involving the musculoskeletal system: Secondary | ICD-10-CM

## 2020-04-08 NOTE — Patient Instructions (Addendum)
1.  I will get MRI of cervical spine to evaluate for any worsening of arthritis that may be causing bilateral arm numbness and weakness. We have sent a referral to [facility] for your MRI and they will call you directly to schedule your appointment. They are located at [place]. If you need to contact them directly please call [phone].  2.  Will get labs from your eye doctor.  Will contact you if I need to check anything else. 3.  Further recommendations pending results. 4.  Follow up after testing

## 2020-05-04 ENCOUNTER — Other Ambulatory Visit: Payer: Self-pay

## 2020-05-04 ENCOUNTER — Ambulatory Visit
Admission: RE | Admit: 2020-05-04 | Discharge: 2020-05-04 | Disposition: A | Discharge status: Home / Self Care | Payer: Medicare Other | Source: Ambulatory Visit | Attending: Neurology | Admitting: Neurology

## 2020-05-04 DIAGNOSIS — R200 Anesthesia of skin: Secondary | ICD-10-CM

## 2020-05-04 DIAGNOSIS — M47812 Spondylosis without myelopathy or radiculopathy, cervical region: Secondary | ICD-10-CM

## 2020-05-04 DIAGNOSIS — R519 Headache, unspecified: Secondary | ICD-10-CM

## 2020-05-04 DIAGNOSIS — R29898 Other symptoms and signs involving the musculoskeletal system: Secondary | ICD-10-CM

## 2020-05-04 DIAGNOSIS — M4802 Spinal stenosis, cervical region: Secondary | ICD-10-CM | POA: Diagnosis not present

## 2020-05-21 ENCOUNTER — Telehealth: Payer: Self-pay

## 2020-05-21 DIAGNOSIS — M7502 Adhesive capsulitis of left shoulder: Secondary | ICD-10-CM

## 2020-05-21 MED ORDER — HYDROCODONE-ACETAMINOPHEN 7.5-325 MG PO TABS
1.0000 | ORAL_TABLET | Freq: Every day | ORAL | 0 refills | Status: DC | PRN
Start: 2020-05-21 — End: 2020-06-15

## 2020-05-21 NOTE — Telephone Encounter (Signed)
pLEASE REFILL RX TO [facility] - OTHER FAMILY MEMBER WILL PICK UP AND TAKE TO HER - BACK AT END OF MONTH - RESCHEDULE TO FIRST WEEK 07/13/23 --- SIS DIED OF COVID

## 2020-05-21 NOTE — Telephone Encounter (Signed)
PMP was Reviewed: Hydrocodone e-scribed today. Ms. Kelson is aware of the above.

## 2020-05-25 ENCOUNTER — Encounter: Payer: Medicare Other | Admitting: Registered Nurse

## 2020-06-03 ENCOUNTER — Other Ambulatory Visit: Payer: Self-pay | Admitting: Registered Nurse

## 2020-06-11 NOTE — Chronic Care Management (AMB) (Signed)
  Chronic Care Management   Outreach Note  01/24/2020 Name: Cassandra [name] MRN: [id number] DOB: 1956-08-11  Referred by: [name], MD Reason for referral : Chronic Care Management (RN follow up)   An unsuccessful telephone outreach was attempted today. The patient was referred to the case management team for assistance with care management and care coordination.   Follow Up Plan: A HIPAA compliant phone message was left for the patient providing contact information and requesting a return call.   Chong Sicilian, BSN, RN-BC Embedded Chronic Care Manager [facility] Direct Dial: [phone]

## 2020-06-15 ENCOUNTER — Encounter: Payer: Self-pay | Admitting: Registered Nurse

## 2020-06-15 ENCOUNTER — Other Ambulatory Visit: Payer: Self-pay

## 2020-06-15 ENCOUNTER — Encounter: Payer: Medicare Other | Attending: Physical Medicine & Rehabilitation | Admitting: Registered Nurse

## 2020-06-15 VITALS — BP 131/81 | HR 72 | Temp 98.3°F | Ht 66.0 in | Wt 150.6 lb

## 2020-06-15 DIAGNOSIS — M7502 Adhesive capsulitis of left shoulder: Secondary | ICD-10-CM | POA: Insufficient documentation

## 2020-06-15 DIAGNOSIS — M546 Pain in thoracic spine: Secondary | ICD-10-CM | POA: Diagnosis not present

## 2020-06-15 DIAGNOSIS — G8929 Other chronic pain: Secondary | ICD-10-CM | POA: Diagnosis not present

## 2020-06-15 DIAGNOSIS — M5412 Radiculopathy, cervical region: Secondary | ICD-10-CM | POA: Insufficient documentation

## 2020-06-15 DIAGNOSIS — M542 Cervicalgia: Secondary | ICD-10-CM | POA: Diagnosis not present

## 2020-06-15 DIAGNOSIS — Z5181 Encounter for therapeutic drug level monitoring: Secondary | ICD-10-CM | POA: Insufficient documentation

## 2020-06-15 DIAGNOSIS — Z79891 Long term (current) use of opiate analgesic: Secondary | ICD-10-CM | POA: Diagnosis not present

## 2020-06-15 DIAGNOSIS — M47812 Spondylosis without myelopathy or radiculopathy, cervical region: Secondary | ICD-10-CM | POA: Insufficient documentation

## 2020-06-15 DIAGNOSIS — G894 Chronic pain syndrome: Secondary | ICD-10-CM | POA: Diagnosis not present

## 2020-06-15 MED ORDER — HYDROCODONE-ACETAMINOPHEN 7.5-325 MG PO TABS: 1.0000 | ORAL_TABLET | Freq: Every day | ORAL | 0 refills | Status: DC | PRN

## 2020-06-15 NOTE — Progress Notes (Signed)
Subjective:    Patient ID: Cassandra [name], female    DOB: Sep 24, 1955, 64 y.o.   MRN: [id number]  HPI: Cassandra [name] is a 64 y.o. female who returns for follow up appointment for chronic pain and medication refill. She states her  pain is located in her neck radiating into her left shoulder, left arm and upper- mid back mainly left side. She rates her pain 7. Her current exercise regime is walking and performing stretching exercises.  Cassandra [name] states her sister passed away, she was diagnosed with COVID-19, emotional support given.   Cassandra [name] Morphine equivalent is 7.50 MME.  Last UDS was Performed on 11/19/2019, it was consistent.   Pain Inventory Average Pain 7 Pain Right Now 7 My pain is sharp, burning, dull, stabbing and aching  In the last 24 hours, has pain interfered with the following? General activity 5 Relation with others 5 Enjoyment of life 5 What TIME of day is your pain at its worst? morning , evening and night Sleep (in general) Poor  Pain is worse with: bending, sitting and inactivity Pain improves with: heat/ice, pacing activities and medication Relief from Meds: 5  Family History  Problem Relation Age of Onset  . Coronary artery disease Mother   . Diabetes Mother        borderline  . Kidney disease Mother        stage 4  . Coronary artery disease Father   . Prostate cancer Father   . Lung disease Father        black lung  . Liver cancer Maternal Grandmother   . Liver disease Maternal Grandmother   . Polymyalgia rheumatica Sister   . Heart disease Sister   . Coronary artery disease Brother        heart attack  . COPD Brother   . Colon polyps Sister   . Lung cancer Paternal Grandmother   . Colon cancer Other        Both sides  . Early death Son    Social History   Socioeconomic History  . Marital status: Divorced    Spouse name: Not on file  . Number of children: 1  . Years of education: Not on file  . Highest education level: GED or equivalent    Occupational History  . Occupation: Homemaker  Tobacco Use  . Smoking status: Former Smoker    Packs/day: 1.00    Years: 42.00    Pack years: 42.00    Types: Cigarettes    Quit date: 06/01/2015    Years since quitting: 5.0  . Smokeless tobacco: Never Used  Vaping Use  . Vaping Use: Never used  Substance and Sexual Activity  . Alcohol use: No    Alcohol/week: 0.0 standard drinks  . Drug use: No  . Sexual activity: Yes  Other Topics Concern  . Not on file  Social History Narrative   Lives alone.  One child.  Two grands.     Right handed   Social Determinants of Health   Financial Resource Strain:   . Difficulty of Paying Living Expenses: Not on file  Food Insecurity:   . Worried About Charity fundraiser in the Last Year: Not on file  . Ran Out of Food in the Last Year: Not on file  Transportation Needs:   . Lack of Transportation (Medical): Not on file  . Lack of Transportation (Non-Medical): Not on file  Physical Activity:   . Days of Exercise  per Week: Not on file  . Minutes of Exercise per Session: Not on file  Stress:   . Feeling of Stress : Not on file  Social Connections:   . Frequency of Communication with Friends and Family: Not on file  . Frequency of Social Gatherings with Friends and Family: Not on file  . Attends Religious Services: Not on file  . Active Member of Clubs or Organizations: Not on file  . Attends Archivist Meetings: Not on file  . Marital Status: Not on file   Past Surgical History:  Procedure Laterality Date  . ABDOMINAL HYSTERECTOMY     age 58  . ANAL RECTAL MANOMETRY N/A 12/09/2015   Procedure: ANO RECTAL MANOMETRY;  Surgeon: Mauri Pole, MD;  Location: [facility];  Service: Endoscopy;  Laterality: N/A;  . BLADDER REPAIR     sling/with perforation  . CESAREAN SECTION     1 time  . KNEE ARTHROSCOPY Left   . ruptured uterus    . SHOULDER ARTHROSCOPY     multiple   Past Surgical History:  Procedure Laterality  Date  . ABDOMINAL HYSTERECTOMY     age 59  . ANAL RECTAL MANOMETRY N/A 12/09/2015   Procedure: ANO RECTAL MANOMETRY;  Surgeon: Mauri Pole, MD;  Location: [facility];  Service: Endoscopy;  Laterality: N/A;  . BLADDER REPAIR     sling/with perforation  . CESAREAN SECTION     1 time  . KNEE ARTHROSCOPY Left   . ruptured uterus    . SHOULDER ARTHROSCOPY     multiple   Past Medical History:  Diagnosis Date  . Anxiety   . Bowel habit changes    excessive gas /frequent BM's with change in shape with pus  . Chronic left shoulder pain    frozen   . Chronic pain    left upper shoulder ,neck and back  . COPD (chronic obstructive pulmonary disease) ([place])    Dr. Redge Gainer  . Depression   . Emphysema lung ([place])   . GERD (gastroesophageal reflux disease)   . Hyperlipidemia   . IBS (irritable bowel syndrome)   . Maternal complication related to childbirth    Sepsis, staph  . Meniere's disease   . Nicotine addiction    2 packs per day   . Skin cancer    Rt. leg, squamous cell  . Tubular adenoma of colon 01/31/2000   Dr. Richmond Campbell  . Weakness of pelvic floor    prolapse -  MRI from 2017   BP 131/81   Pulse 72   Temp 98.3 F (36.8 C)   Ht 5\' 6"  (1.676 m)   Wt 150 lb 9.6 oz (68.3 kg)   SpO2 95%   BMI 24.31 kg/m   Opioid Risk Score:   Fall Risk Score:  `1  Depression screen PHQ 2/9  Depression screen [facility] 2/9 03/26/2020 02/06/2020 05/17/2019 04/22/2019 12/12/2018 11/26/2018 10/04/2018  Decreased Interest 0 0 0 0 1 2 1   Down, Depressed, Hopeless 0 0 0 0 1 1 1   PHQ - 2 Score 0 0 0 0 2 3 2   Altered sleeping - - - - - 3 2  Tired, decreased energy - - - - - 3 3  Change in appetite - - - - - 3 3  Feeling bad or failure about yourself  - - - - - 0 0  Trouble concentrating - - - - - 1 1  Moving slowly or fidgety/restless - - - - -  0 0  Suicidal thoughts - - - - - 0 0  PHQ-9 Score - - - - - 13 11  Some recent data might be hidden    Review of Systems  Musculoskeletal:  Positive for neck pain.  All other systems reviewed and are negative.      Objective:   Physical Exam Vitals and nursing note reviewed.  Constitutional:      Appearance: Normal appearance.  Cardiovascular:     Rate and Rhythm: Normal rate and regular rhythm.     Pulses: Normal pulses.     Heart sounds: Normal heart sounds.  Pulmonary:     Effort: Pulmonary effort is normal.     Breath sounds: Normal breath sounds.  Musculoskeletal:     Cervical back: Normal range of motion and neck supple.     Comments: Normal Muscle Bulk and Muscle Testing Reveals:  Upper Extremities: Right: Full ROM and Muscle Strength 5/5 Left Lower Extremity: Decreased ROM 30 Degrees and Muscle Strength 4/5 Left AC Joint Tenderness  Thoracic Hypersensitivity: T-1-T-7  Lower Extremities: Full ROM and Muscle Strength 5/5 Arises from chair with ease Narrow Based  Gait   Skin:    General: Skin is warm and dry.  Neurological:     Mental Status: She is alert and oriented to person, place, and time.  Psychiatric:        Mood and Affect: Mood normal.        Behavior: Behavior normal.           Assessment & Plan:  1.Post-traumatic arthritis left shoulder s/p 2 shoulder surgeries. Now with adhesive capsulitis left shoulder.010/12/2019. Refilled:Hydrocodone7.5/325mg  one tablet dailyas needed. #30.  We will continue the opioid monitoring program, this consists of regular clinic visits, examinations, urine drug screen, pill counts as well as use of [place] Controlled Substance Reporting system. A 12 month History has been reviewed on the [facility] on 06/15/2020.  2. Cervicalgia,Cervicalspondylosis/ Cervical Radiculitis: Continue Current Medication Regime.Robaxinand Gabapentindose changed to 100 mg BID, Gabapentin 300 mg discontinued due to daytime drowsiness..06/15/2020. 3. Depression/insomnia/anxiety: ContinueTo monitor..Dr.  Kelby Fam.06/15/2020. 4. Myofascial Pain: Continue Robaxin. Continue to monitor.06/15/2020 5. Chronic Pain of Knees: No complaints today.Continue HEP as Tolerated. Continue to Monitor. 06/15/2020. 6. Chronic Left- Sided Thoracic Back Pain: Continue HEP as Tolerated. Continue to Alternate Ice and Heat Therapy. Continue current medication regimen.06/15/2020 7. Numbness and Tingling/ Paresthesia: No complaints Today: Neurology Following.Continue to Monitor. 06/15/2020 8. Diplopia: No complaints today.  Ophthalmology following and she has an appointment with Neurology. Continue to monitor. 06/15/2020.  58minutes of face to face patient care time was spent during this visit. All questions were encouraged and answered.   F/U in 2 months

## 2020-06-23 ENCOUNTER — Telehealth: Payer: Self-pay | Admitting: *Deleted

## 2020-06-23 NOTE — Telephone Encounter (Signed)
  Chronic Care Management   Note  06/23/2020 Name: Cassandra [name] MRN: [id number] DOB: 15-Jun-1956   Last two outreach attempts were unsuccessful and a return call was not received. Patient was well controlled at the time of our last telephone visit.   Follow up plan: Removing patient from CCM program. Patient can be added back at anytime she or her provided think it is necessary.   Chong Sicilian, BSN, RN-BC Embedded Chronic Care Manager [facility] Direct Dial: [phone]

## 2020-08-03 ENCOUNTER — Encounter: Payer: Self-pay | Admitting: Family Medicine

## 2020-08-03 ENCOUNTER — Ambulatory Visit (INDEPENDENT_AMBULATORY_CARE_PROVIDER_SITE_OTHER): Payer: Medicare Other | Admitting: Family Medicine

## 2020-08-03 ENCOUNTER — Other Ambulatory Visit: Payer: Self-pay

## 2020-08-03 DIAGNOSIS — M47812 Spondylosis without myelopathy or radiculopathy, cervical region: Secondary | ICD-10-CM | POA: Diagnosis not present

## 2020-08-03 DIAGNOSIS — M7918 Myalgia, other site: Secondary | ICD-10-CM

## 2020-08-03 DIAGNOSIS — K581 Irritable bowel syndrome with constipation: Secondary | ICD-10-CM | POA: Diagnosis not present

## 2020-08-03 DIAGNOSIS — N135 Crossing vessel and stricture of ureter without hydronephrosis: Secondary | ICD-10-CM | POA: Diagnosis not present

## 2020-08-03 MED ORDER — METHOCARBAMOL 500 MG PO TABS
500.0000 mg | ORAL_TABLET | Freq: Two times a day (BID) | ORAL | 5 refills | Status: DC
Start: 2020-08-03 — End: 2022-08-15

## 2020-08-03 MED ORDER — AMOXICILLIN-POT CLAVULANATE 875-125 MG PO TABS: 1.0000 | ORAL_TABLET | Freq: Two times a day (BID) | ORAL | 0 refills | Status: DC

## 2020-08-03 MED ORDER — MIRABEGRON ER 50 MG PO TB24: 50.0000 mg | ORAL_TABLET | Freq: Every day | ORAL | 1 refills | Status: DC

## 2020-08-03 NOTE — Progress Notes (Signed)
Subjective:    Patient ID: Cassandra [name], female    DOB: 01-01-1956, 64 y.o.   MRN: [id number]   HPI: [name] is a 64 y.o. female presenting for sinus symptoms for a while. At first felt it was allergy, but now having facial pain in cheeks, teeth and a little in ears. Yellow mucopurulent rhinorrhea. No fever. Has a cough. Productive. Feels it is caused by PND. Using OTC zind, Vit. C, Sinex and mucinex liquid. Discovered that she was intolerant of cipro. Made her weak and shaky, "falling apart.   She tells me that she still occasionally uses the Robaxin for muscle aches but not eating very often she would like to have it refilled for use as needed.  She additionally mentions that she is using Myrbetriq successfully now.  She was seeing a specialist who gave it to her for a urethral stricture.  She was told that it was something she just have to live with but that the Myrbetriq would help.  She feels the Myrbetriq is working well and would like to have that renewed as well.   Depression screen [facility] 2/9 03/26/2020 02/06/2020 05/17/2019 04/22/2019 12/12/2018  Decreased Interest 0 0 0 0 1  Down, Depressed, Hopeless 0 0 0 0 1  PHQ - 2 Score 0 0 0 0 2  Altered sleeping - - - - -  Tired, decreased energy - - - - -  Change in appetite - - - - -  Feeling bad or failure about yourself  - - - - -  Trouble concentrating - - - - -  Moving slowly or fidgety/restless - - - - -  Suicidal thoughts - - - - -  PHQ-9 Score - - - - -  Some recent data might be hidden     Relevant past medical, surgical, family and social history reviewed and updated as indicated.  Interim medical history since our last visit reviewed. Allergies and medications reviewed and updated.  ROS:  Review of Systems  Constitutional: Negative.   HENT: Negative.   Eyes: Negative for visual disturbance.  Respiratory: Negative for shortness of breath.   Cardiovascular: Negative for chest pain.  Gastrointestinal: Negative for  abdominal pain.  Genitourinary: Positive for difficulty urinating.  Musculoskeletal: Positive for myalgias. Negative for arthralgias.     Social History   Tobacco Use  Smoking Status Former Smoker   Packs/day: 1.00   Years: 42.00   Pack years: 42.00   Types: Cigarettes   Quit date: 06/01/2015   Years since quitting: 5.1  Smokeless Tobacco Never Used       Objective:     Wt Readings from Last 3 Encounters:  06/15/20 150 lb 9.6 oz (68.3 kg)  04/08/20 146 lb 3.2 oz (66.3 kg)  03/26/20 145 lb 12.8 oz (66.1 kg)     Exam deferred. Pt. Harboring due to COVID 19. Phone visit performed.   Assessment & Plan:   1. Myofascial pain   2. Irritable bowel syndrome with constipation   3. Cervical spondylosis without myelopathy   4. Ureteral stricture     Meds ordered this encounter  Medications   mirabegron ER (MYRBETRIQ) 50 MG TB24 tablet    Sig: Take 1 tablet (50 mg total) by mouth daily.    Dispense:  90 tablet    Refill:  1   amoxicillin-clavulanate (AUGMENTIN) 875-125 MG tablet    Sig: Take 1 tablet by mouth 2 (two) times daily. Take all of this  medication    Dispense:  20 tablet    Refill:  0   methocarbamol (ROBAXIN) 500 MG tablet    Sig: Take 1 tablet (500 mg total) by mouth 2 (two) times daily.    Dispense:  60 tablet    Refill:  5    No orders of the defined types were placed in this encounter.     Diagnoses and all orders for this visit:  Myofascial pain  Irritable bowel syndrome with constipation  Cervical spondylosis without myelopathy  Ureteral stricture  Other orders -     mirabegron ER (MYRBETRIQ) 50 MG TB24 tablet; Take 1 tablet (50 mg total) by mouth daily. -     amoxicillin-clavulanate (AUGMENTIN) 875-125 MG tablet; Take 1 tablet by mouth 2 (two) times daily. Take all of this medication -     methocarbamol (ROBAXIN) 500 MG tablet; Take 1 tablet (500 mg total) by mouth 2 (two) times daily.    Virtual Visit via telephone Note  I  discussed the limitations, risks, security and privacy concerns of performing an evaluation and management service by telephone and the availability of in person appointments. The patient was identified with two identifiers. Pt.expressed understanding and agreed to proceed. Pt. Is at home. Dr. Livia Snellen is in his office.  Follow Up Instructions:   I discussed the assessment and treatment plan with the patient. The patient was provided an opportunity to ask questions and all were answered. The patient agreed with the plan and demonstrated an understanding of the instructions.   The patient was advised to call back or seek an in-person evaluation if the symptoms worsen or if the condition fails to improve as anticipated.   Total minutes including chart review and phone contact time: 31   Follow up plan: Return in about 6 months (around 01/31/2021).  [name], MD [place]

## 2020-08-19 ENCOUNTER — Ambulatory Visit: Payer: Medicare Other | Admitting: Neurology

## 2020-08-27 ENCOUNTER — Encounter: Payer: Medicare Other | Attending: Physical Medicine & Rehabilitation | Admitting: Registered Nurse

## 2020-08-27 ENCOUNTER — Encounter: Payer: Self-pay | Admitting: Registered Nurse

## 2020-08-27 ENCOUNTER — Other Ambulatory Visit: Payer: Self-pay

## 2020-08-27 VITALS — BP 110/65 | HR 75 | Temp 98.7°F | Ht 66.0 in | Wt 153.0 lb

## 2020-08-27 DIAGNOSIS — M5412 Radiculopathy, cervical region: Secondary | ICD-10-CM | POA: Insufficient documentation

## 2020-08-27 DIAGNOSIS — M542 Cervicalgia: Secondary | ICD-10-CM | POA: Insufficient documentation

## 2020-08-27 DIAGNOSIS — G894 Chronic pain syndrome: Secondary | ICD-10-CM | POA: Diagnosis not present

## 2020-08-27 DIAGNOSIS — M47812 Spondylosis without myelopathy or radiculopathy, cervical region: Secondary | ICD-10-CM | POA: Insufficient documentation

## 2020-08-27 DIAGNOSIS — G8929 Other chronic pain: Secondary | ICD-10-CM | POA: Diagnosis not present

## 2020-08-27 DIAGNOSIS — M7502 Adhesive capsulitis of left shoulder: Secondary | ICD-10-CM | POA: Insufficient documentation

## 2020-08-27 DIAGNOSIS — M546 Pain in thoracic spine: Secondary | ICD-10-CM | POA: Diagnosis not present

## 2020-08-27 DIAGNOSIS — Z79891 Long term (current) use of opiate analgesic: Secondary | ICD-10-CM | POA: Diagnosis not present

## 2020-08-27 DIAGNOSIS — Z5181 Encounter for therapeutic drug level monitoring: Secondary | ICD-10-CM | POA: Diagnosis not present

## 2020-08-27 MED ORDER — HYDROCODONE-ACETAMINOPHEN 5-325 MG PO TABS
1.0000 | ORAL_TABLET | Freq: Every day | ORAL | 0 refills | Status: DC | PRN
Start: 2020-08-27 — End: 2020-11-06

## 2020-08-27 MED ORDER — HYDROCODONE-ACETAMINOPHEN 5-325 MG PO TABS: 1.0000 | ORAL_TABLET | Freq: Every day | ORAL | 0 refills | Status: DC | PRN

## 2020-08-27 MED ORDER — HYDROCODONE-ACETAMINOPHEN 5-325 MG PO TABS
1.0000 | ORAL_TABLET | Freq: Every day | ORAL | 0 refills | Status: DC | PRN
Start: 2020-08-27 — End: 2020-08-27

## 2020-08-27 NOTE — Progress Notes (Signed)
Subjective:    Patient ID: Cassandra [name], female    DOB: 1956/08/16, 64 y.o.   MRN: [id number]  HPI: Cassandra [name] is a 64 y.o. female who returns for follow up appointment for chronic pain and medication refill. She states her pain is located in her neck radiating into her left shoulder, left arm and left hand with tingling and numbness. Also reports upper back pain maily upper left side. She rates her pain 7. Her  current exercise regime is walking and performing stretching exercises.  [name] Morphine equivalent is 7.50 MME.  UDS ordered for today.    Pain Inventory Average Pain 7 Pain Right Now 7 My pain is sharp, burning, dull, stabbing, tingling and aching  In the last 24 hours, has pain interfered with the following? General activity 5 Relation with others 5 Enjoyment of life 5 What TIME of day is your pain at its worst? morning , evening and night Sleep (in general) Poor  Pain is worse with: bending, sitting, inactivity and some activites Pain improves with: rest, heat/ice, pacing activities and medication Relief from Meds: 6  Family History  Problem Relation Age of Onset  . Coronary artery disease Mother   . Diabetes Mother        borderline  . Kidney disease Mother        stage 4  . Coronary artery disease Father   . Prostate cancer Father   . Lung disease Father        black lung  . Liver cancer Maternal Grandmother   . Liver disease Maternal Grandmother   . Polymyalgia rheumatica Sister   . Heart disease Sister   . Coronary artery disease Brother        heart attack  . COPD Brother   . Colon polyps Sister   . Lung cancer Paternal Grandmother   . Colon cancer Other        Both sides  . Early death Son    Social History   Socioeconomic History  . Marital status: Divorced    Spouse name: Not on file  . Number of children: 1  . Years of education: Not on file  . Highest education level: GED or equivalent  Occupational History  . Occupation:  Homemaker  Tobacco Use  . Smoking status: Former Smoker    Packs/day: 1.00    Years: 42.00    Pack years: 42.00    Types: Cigarettes    Quit date: 06/01/2015    Years since quitting: 5.2  . Smokeless tobacco: Never Used  Vaping Use  . Vaping Use: Never used  Substance and Sexual Activity  . Alcohol use: No    Alcohol/week: 0.0 standard drinks  . Drug use: No  . Sexual activity: Yes  Other Topics Concern  . Not on file  Social History Narrative   Lives alone.  One child.  Two grands.     Right handed   Social Determinants of Health   Financial Resource Strain: Not on file  Food Insecurity: Not on file  Transportation Needs: Not on file  Physical Activity: Not on file  Stress: Not on file  Social Connections: Not on file   Past Surgical History:  Procedure Laterality Date  . ABDOMINAL HYSTERECTOMY     age 15  . ANAL RECTAL MANOMETRY N/A 12/09/2015   Procedure: ANO RECTAL MANOMETRY;  Surgeon: Cassandra Pole, MD;  Location: [facility];  Service: Endoscopy;  Laterality: N/A;  . BLADDER  REPAIR     sling/with perforation  . CESAREAN SECTION     1 time  . KNEE ARTHROSCOPY Left   . ruptured uterus    . SHOULDER ARTHROSCOPY     multiple   Past Surgical History:  Procedure Laterality Date  . ABDOMINAL HYSTERECTOMY     age 41  . ANAL RECTAL MANOMETRY N/A 12/09/2015   Procedure: ANO RECTAL MANOMETRY;  Surgeon: Cassandra Pole, MD;  Location: [facility];  Service: Endoscopy;  Laterality: N/A;  . BLADDER REPAIR     sling/with perforation  . CESAREAN SECTION     1 time  . KNEE ARTHROSCOPY Left   . ruptured uterus    . SHOULDER ARTHROSCOPY     multiple   Past Medical History:  Diagnosis Date  . Anxiety   . Bowel habit changes    excessive gas /frequent BM's with change in shape with pus  . Chronic left shoulder pain    frozen   . Chronic pain    left upper shoulder ,neck and back  . COPD (chronic obstructive pulmonary disease) ([place])    Dr. Redge [name]   . Depression   . Emphysema lung ([place])   . GERD (gastroesophageal reflux disease)   . Hyperlipidemia   . IBS (irritable bowel syndrome)   . Maternal complication related to childbirth    Sepsis, staph  . Meniere's disease   . Nicotine addiction    2 packs per day   . Skin cancer    Rt. leg, squamous cell  . Tubular adenoma of colon 01/31/2000   Dr. Richmond [name]  . Weakness of pelvic floor    prolapse -  MRI from 2017   BP 110/65   Pulse 75   Temp 98.7 F (37.1 C)   Ht 5\' 6"  (1.676 m)   Wt 153 lb (69.4 kg)   SpO2 98%   BMI 24.69 kg/m   Opioid Risk Score:   Fall Risk Score:  `1  Depression screen PHQ 2/9  Depression screen [facility] 2/9 03/26/2020 02/06/2020 05/17/2019 04/22/2019 12/12/2018 11/26/2018 10/04/2018  Decreased Interest 0 0 0 0 1 2 1   Down, Depressed, Hopeless 0 0 0 0 1 1 1   PHQ - 2 Score 0 0 0 0 2 3 2   Altered sleeping - - - - - 3 2  Tired, decreased energy - - - - - 3 3  Change in appetite - - - - - 3 3  Feeling bad or failure about yourself  - - - - - 0 0  Trouble concentrating - - - - - 1 1  Moving slowly or fidgety/restless - - - - - 0 0  Suicidal thoughts - - - - - 0 0  PHQ-9 Score - - - - - 13 11  Some recent data might be hidden    Review of Systems  Constitutional: Negative.   HENT: Negative.   Eyes: Negative.   Respiratory: Negative.   Cardiovascular: Negative.   Gastrointestinal: Negative.   Endocrine: Negative.   Genitourinary: Negative.   Musculoskeletal: Positive for arthralgias, back pain and neck pain.  Allergic/Immunologic: Negative.   Neurological: Positive for weakness.       Tingling   Hematological: Negative.   Psychiatric/Behavioral: Negative.   All other systems reviewed and are negative.      Objective:   Physical Exam Vitals and nursing note reviewed.  Constitutional:      Appearance: Normal appearance.  Cardiovascular:     Rate  and Rhythm: Normal rate and regular rhythm.     Pulses: Normal pulses.     Heart sounds:  Normal heart sounds.  Pulmonary:     Effort: Pulmonary effort is normal.     Breath sounds: Normal breath sounds.  Musculoskeletal:     Cervical back: Normal range of motion and neck supple.     Comments: Normal Muscle Bulk and Muscle Testing Reveals:  Upper Extremities: Right: Full ROM and Muscle Strength 5/5 Left Upper Extremity: Decreased ROM 30 Degrees and Muscle Strength 5/5 Left AC Joint Tenderness  Thoracic Hypersensitivity: T-1-T-7  Lower Extremities: Full ROM and Muscle Strength 5/5 Arises from chair with ease Narrow Based  Gait   Skin:    General: Skin is warm and dry.  Neurological:     Mental Status: She is alert and oriented to person, place, and time.  Psychiatric:        Mood and Affect: Mood normal.        Behavior: Behavior normal.           Assessment & Plan:  1.Post-traumatic arthritis left shoulder s/p 2 shoulder surgeries. Now with adhesive capsulitis left shoulder.08/27/2020. Refilled:Decreased: Hydrocodone5.0/325mg  one tablet dailyas needed. #30.  We will continue the opioid monitoring program, this consists of regular clinic visits, examinations, urine drug screen, pill counts as well as use of [place] Controlled Substance Reporting system. A 12 month History has been reviewed on the [facility] on 08/27/2020.  2. Cervicalgia,Cervicalspondylosis/ Cervical Radiculitis: Continue Current Medication Regime.Robaxinand Gabapentindose changed to 100 mg BID, Gabapentin 300 mg discontinued due to daytime drowsiness..08/27/2020. 3. Depression/insomnia/anxiety: ContinueTo monitor..Dr. Kelby Fam.08/27/2020. 4. Myofascial Pain: Continue Robaxin. Continue to monitor.08/27/2020 5. Chronic Pain of Knees: No complaints today.Continue HEP as Tolerated. Continue to Monitor.08/27/2020. 6. ChronicLeft- SidedThoracic Back Pain: Continue HEP as Tolerated. Continue to Alternate Ice and Heat Therapy. Continue  current medication regimen.08/27/2020 7. Numbness and Tingling/ Paresthesia:  Neurology Following.Continue to Monitor. 08/27/2020 8. Diplopia:No complaints today. Ophthalmology following and she has an appointment withNeurology. Continue to monitor. 08/27/2020.  F/U in 2 months

## 2020-09-04 LAB — TOXASSURE SELECT,+ANTIDEPR,UR

## 2020-10-20 ENCOUNTER — Telehealth: Payer: Self-pay | Admitting: Neurology

## 2020-10-20 NOTE — Telephone Encounter (Signed)
Telephone call to pt, Advised pt that a My chart message was sent to the pt 05/05/20 with her results and per chart it stated pt read the message.  As well as the message read that we need the labs from her Eye provider. Looking back a the chart it looks like we sent a records request to the provider on 04/16/20.  I will call [facility] to see if they received the records request. Had to resend request, Per Ivin Booty they never got the request.

## 2020-10-20 NOTE — Telephone Encounter (Signed)
Patient called and cancelled her follow up appointment with Dr. Tomi Likens for 10/22/20. She said she still hasn't received her MRI results from August 2021 yet and she wants those results before rescheduling.

## 2020-10-22 ENCOUNTER — Ambulatory Visit: Payer: Medicare Other | Admitting: Neurology

## 2020-10-28 ENCOUNTER — Encounter: Payer: Medicare Other | Admitting: Registered Nurse

## 2020-11-06 ENCOUNTER — Other Ambulatory Visit: Payer: Self-pay

## 2020-11-06 ENCOUNTER — Encounter: Payer: Medicare Other | Attending: Physical Medicine & Rehabilitation | Admitting: Registered Nurse

## 2020-11-06 ENCOUNTER — Encounter: Payer: Self-pay | Admitting: Registered Nurse

## 2020-11-06 VITALS — BP 130/74 | HR 71 | Temp 97.8°F | Ht 66.0 in | Wt 155.4 lb

## 2020-11-06 DIAGNOSIS — M7502 Adhesive capsulitis of left shoulder: Secondary | ICD-10-CM

## 2020-11-06 DIAGNOSIS — Z79891 Long term (current) use of opiate analgesic: Secondary | ICD-10-CM

## 2020-11-06 DIAGNOSIS — Z5181 Encounter for therapeutic drug level monitoring: Secondary | ICD-10-CM | POA: Diagnosis not present

## 2020-11-06 DIAGNOSIS — M5412 Radiculopathy, cervical region: Secondary | ICD-10-CM | POA: Diagnosis not present

## 2020-11-06 DIAGNOSIS — M546 Pain in thoracic spine: Secondary | ICD-10-CM | POA: Diagnosis not present

## 2020-11-06 DIAGNOSIS — G894 Chronic pain syndrome: Secondary | ICD-10-CM | POA: Diagnosis not present

## 2020-11-06 DIAGNOSIS — G8929 Other chronic pain: Secondary | ICD-10-CM | POA: Diagnosis not present

## 2020-11-06 DIAGNOSIS — M542 Cervicalgia: Secondary | ICD-10-CM | POA: Diagnosis not present

## 2020-11-06 DIAGNOSIS — M47812 Spondylosis without myelopathy or radiculopathy, cervical region: Secondary | ICD-10-CM | POA: Diagnosis not present

## 2020-11-06 MED ORDER — HYDROCODONE-ACETAMINOPHEN 5-325 MG PO TABS
1.0000 | ORAL_TABLET | Freq: Every day | ORAL | 0 refills | Status: DC | PRN
Start: 2020-11-06 — End: 2021-01-14

## 2020-11-06 MED ORDER — HYDROCODONE-ACETAMINOPHEN 5-325 MG PO TABS
1.0000 | ORAL_TABLET | Freq: Every day | ORAL | 0 refills | Status: DC | PRN
Start: 2020-11-06 — End: 2020-11-06

## 2020-11-06 NOTE — Progress Notes (Signed)
Subjective:    Patient ID: Cassandra [name], female    DOB: 1956/01/24, 65 y.o.   MRN: [id number]  HPI: Cassandra [name] is a 65 y.o. female who returns for follow up appointment for chronic pain and medication refill. She states her  pain is located in her neck radiating into her left shoulder and upper back mainly left side. He rates his pain 6. His current exercise regime is walking and performing stretching exercises.  Ms. [place] Morphine equivalent is 5.00 MME.     Last UDS was performed on 08/27/2020, no medication detected, she uses her hydrocodone sparingly..    Pain Inventory Average Pain 7 Pain Right Now 6 My pain is intermittent, sharp, burning, dull and stabbing  In the last 24 hours, has pain interfered with the following? General activity 6 Relation with others 5 Enjoyment of life 5 What TIME of day is your pain at its worst? morning , daytime, evening and night Sleep (in general) Poor  Pain is worse with: bending, sitting and some activites Pain improves with: heat/ice, pacing activities and medication Relief from Meds: 5  Family History  Problem Relation Age of Onset  . Coronary artery disease Mother   . Diabetes Mother        borderline  . Kidney disease Mother        stage 4  . Coronary artery disease Father   . Prostate cancer Father   . Lung disease Father        black lung  . Liver cancer Maternal Grandmother   . Liver disease Maternal Grandmother   . Polymyalgia rheumatica Sister   . Heart disease Sister   . Coronary artery disease Brother        heart attack  . COPD Brother   . Colon polyps Sister   . Lung cancer Paternal Grandmother   . Colon cancer Other        Both sides  . Early death Son    Social History   Socioeconomic History  . Marital status: Divorced    Spouse name: Not on file  . Number of children: 1  . Years of education: Not on file  . Highest education level: GED or equivalent  Occupational History  . Occupation: Homemaker   Tobacco Use  . Smoking status: Former Smoker    Packs/day: 1.00    Years: 42.00    Pack years: 42.00    Types: Cigarettes    Quit date: 06/01/2015    Years since quitting: 5.4  . Smokeless tobacco: Never Used  Vaping Use  . Vaping Use: Never used  Substance and Sexual Activity  . Alcohol use: No    Alcohol/week: 0.0 standard drinks  . Drug use: No  . Sexual activity: Yes  Other Topics Concern  . Not on file  Social History Narrative   Lives alone.  One child.  Two grands.     Right handed   Social Determinants of Health   Financial Resource Strain: Not on file  Food Insecurity: Not on file  Transportation Needs: Not on file  Physical Activity: Not on file  Stress: Not on file  Social Connections: Not on file   Past Surgical History:  Procedure Laterality Date  . ABDOMINAL HYSTERECTOMY     age 25  . ANAL RECTAL MANOMETRY N/A 12/09/2015   Procedure: ANO RECTAL MANOMETRY;  Surgeon: Mauri Pole, MD;  Location: [facility];  Service: Endoscopy;  Laterality: N/A;  . BLADDER REPAIR  sling/with perforation  . CESAREAN SECTION     1 time  . KNEE ARTHROSCOPY Left   . ruptured uterus    . SHOULDER ARTHROSCOPY     multiple   Past Surgical History:  Procedure Laterality Date  . ABDOMINAL HYSTERECTOMY     age 10  . ANAL RECTAL MANOMETRY N/A 12/09/2015   Procedure: ANO RECTAL MANOMETRY;  Surgeon: Mauri Pole, MD;  Location: [facility];  Service: Endoscopy;  Laterality: N/A;  . BLADDER REPAIR     sling/with perforation  . CESAREAN SECTION     1 time  . KNEE ARTHROSCOPY Left   . ruptured uterus    . SHOULDER ARTHROSCOPY     multiple   Past Medical History:  Diagnosis Date  . Anxiety   . Bowel habit changes    excessive gas /frequent BM's with change in shape with pus  . Chronic left shoulder pain    frozen   . Chronic pain    left upper shoulder ,neck and back  . COPD (chronic obstructive pulmonary disease) ([place])    Dr. Redge Gainer  .  Depression   . Emphysema lung ([place])   . GERD (gastroesophageal reflux disease)   . Hyperlipidemia   . IBS (irritable bowel syndrome)   . Maternal complication related to childbirth    Sepsis, staph  . Meniere's disease   . Nicotine addiction    2 packs per day   . Skin cancer    Rt. leg, squamous cell  . Tubular adenoma of colon 01/31/2000   Dr. Richmond Campbell  . Weakness of pelvic floor    prolapse -  MRI from 2017   There were no vitals taken for this visit.  Opioid Risk Score:   Fall Risk Score:  `1  Depression screen PHQ 2/9  Depression screen [facility] 2/9 03/26/2020 02/06/2020 05/17/2019 04/22/2019 12/12/2018 11/26/2018 10/04/2018  Decreased Interest 0 0 0 0 1 2 1   Down, Depressed, Hopeless 0 0 0 0 1 1 1   PHQ - 2 Score 0 0 0 0 2 3 2   Altered sleeping - - - - - 3 2  Tired, decreased energy - - - - - 3 3  Change in appetite - - - - - 3 3  Feeling bad or failure about yourself  - - - - - 0 0  Trouble concentrating - - - - - 1 1  Moving slowly or fidgety/restless - - - - - 0 0  Suicidal thoughts - - - - - 0 0  PHQ-9 Score - - - - - 13 11  Some recent data might be hidden   Review of Systems  Musculoskeletal: Positive for back pain and gait problem.       Pain in left neck, left shoulder,left upper back, left arm, left hand  All other systems reviewed and are negative.      Objective:   Physical Exam Vitals and nursing note reviewed.  Constitutional:      Appearance: Normal appearance.  Neck:     Comments: Cervical Paraspinal Tenderness: C-5-C-6  Cardiovascular:     Rate and Rhythm: Normal rate and regular rhythm.     Pulses: Normal pulses.     Heart sounds: Normal heart sounds.  Pulmonary:     Effort: Pulmonary effort is normal.     Breath sounds: Normal breath sounds.  Musculoskeletal:     Cervical back: Normal range of motion and neck supple.     Comments: Normal  Muscle Bulk and Muscle Testing Reveals:  Upper Extremities: Right: Full ROM and Muscle Strength  5/5 Left Upper Extremity: Decreased ROM 30 Degrees and Muscle Strength 3/4 Left AC Joint Tenderness  Thoracic Hypersensitivity: T-1-T-4 Lower Extremities: Full ROM and Muscle Strength 5/5 Arises from chair with ease Narrow Based  Gait   Skin:    General: Skin is warm and dry.  Neurological:     Mental Status: She is alert and oriented to person, place, and time.  Psychiatric:        Mood and Affect: Mood normal.        Behavior: Behavior normal.           Assessment & Plan:  1.Post-traumatic arthritis left shoulder s/p 2 shoulder surgeries. Now with adhesive capsulitis left shoulder.11/07/2023. Refilled: Hydrocodone5.0/325mg  one tablet dailyas needed. #30. We will continue the opioid monitoring program, this consists of regular clinic visits, examinations, urine drug screen, pill counts as well as use of [place] Controlled Substance Reporting system. A 12 month History has been reviewed on the [place] Controlled Substance Reporting Systemon 11/06/2020. 2. Cervicalgia,Cervicalspondylosis/ Cervical Radiculitis: Continue Current Medication Regime.Robaxinand Gabapentindose changed to 100 mg BID, Gabapentin 300 mg discontinued due to daytime drowsiness.Marland Kitchen02/25/2022. 3. Depression/insomnia/anxiety: ContinueTo monitor..Dr. Kelby Fam.11/06/2020. 4. Myofascial Pain: Continue Robaxin. Continue to monitor.11/06/2020 5. Chronic Pain of Knees:No complaints today.Continue HEP as Tolerated. Continue to Monitor.11/06/2020. 6. ChronicLeft- SidedThoracic Back Pain: Continue HEP as Tolerated. Continue to Alternate Ice and Heat Therapy. Continue current medication regimen.11/06/2020 7. Numbness and Tingling/ Paresthesia:No complaints today. Neurology Following.Continue to Monitor.11/06/2020 8. Diplopia:No complaints today. Ophthalmology following and  Continue to monitor.11/06/2020.  F/U in 2 months

## 2021-01-11 ENCOUNTER — Encounter: Payer: Medicare Other | Admitting: Registered Nurse

## 2021-01-14 ENCOUNTER — Encounter: Payer: Medicare Other | Attending: Physical Medicine & Rehabilitation | Admitting: Registered Nurse

## 2021-01-14 ENCOUNTER — Encounter: Payer: Self-pay | Admitting: Registered Nurse

## 2021-01-14 ENCOUNTER — Other Ambulatory Visit: Payer: Self-pay

## 2021-01-14 VITALS — BP 121/74 | HR 74 | Temp 98.3°F | Ht 66.0 in | Wt 157.0 lb

## 2021-01-14 DIAGNOSIS — M5412 Radiculopathy, cervical region: Secondary | ICD-10-CM

## 2021-01-14 DIAGNOSIS — M546 Pain in thoracic spine: Secondary | ICD-10-CM | POA: Diagnosis not present

## 2021-01-14 DIAGNOSIS — M778 Other enthesopathies, not elsewhere classified: Secondary | ICD-10-CM | POA: Diagnosis not present

## 2021-01-14 DIAGNOSIS — M7711 Lateral epicondylitis, right elbow: Secondary | ICD-10-CM

## 2021-01-14 DIAGNOSIS — M7502 Adhesive capsulitis of left shoulder: Secondary | ICD-10-CM | POA: Diagnosis not present

## 2021-01-14 DIAGNOSIS — G894 Chronic pain syndrome: Secondary | ICD-10-CM

## 2021-01-14 DIAGNOSIS — M47812 Spondylosis without myelopathy or radiculopathy, cervical region: Secondary | ICD-10-CM | POA: Diagnosis not present

## 2021-01-14 DIAGNOSIS — Z79891 Long term (current) use of opiate analgesic: Secondary | ICD-10-CM

## 2021-01-14 DIAGNOSIS — G8929 Other chronic pain: Secondary | ICD-10-CM | POA: Diagnosis not present

## 2021-01-14 DIAGNOSIS — M542 Cervicalgia: Secondary | ICD-10-CM

## 2021-01-14 DIAGNOSIS — Z5181 Encounter for therapeutic drug level monitoring: Secondary | ICD-10-CM

## 2021-01-14 MED ORDER — METHYLPREDNISOLONE 4 MG PO TBPK: ORAL_TABLET | ORAL | 0 refills | Status: DC

## 2021-01-14 MED ORDER — GABAPENTIN 100 MG PO CAPS: 100.0000 mg | ORAL_CAPSULE | Freq: Two times a day (BID) | ORAL | 3 refills | Status: DC

## 2021-01-14 MED ORDER — HYDROCODONE-ACETAMINOPHEN 5-325 MG PO TABS
1.0000 | ORAL_TABLET | Freq: Every day | ORAL | 0 refills | Status: DC | PRN
Start: 2021-01-14 — End: 2022-08-15

## 2021-01-14 NOTE — Progress Notes (Signed)
Subjective:    Patient ID: Cassandra [name], female    DOB: 02/22/1956, 65 y.o.   MRN: [id number]  HPI: Cassandra [name] is a 65 y.o. female who returns for follow up appointment for chronic pain and medication refill. She states her pain is located in her neck radiating into her left shoulder and left arm with tingling and numbness and upper back mainly left side. Also  right shoulder and right elbow pain. She rates her pain 6. Her current exercise regime is walking and performing stretching exercises.  Ms. Metzger Morphine equivalent is 5.00  MME.  UDS ordered today.  Pain Inventory Average Pain 6 Pain Right Now 6 My pain is constant, dull, stabbing, tingling and aching  In the last 24 hours, has pain interfered with the following? General activity 7 Relation with others 6 Enjoyment of life 6 What TIME of day is your pain at its worst? morning , daytime, evening and night Sleep (in general) Poor  Pain is worse with: walking, bending, sitting, inactivity and standing Pain improves with: rest, heat/ice, pacing activities and medication Relief from Meds: 5  Family History  Problem Relation Age of Onset  . Coronary artery disease Mother   . Diabetes Mother        borderline  . Kidney disease Mother        stage 4  . Coronary artery disease Father   . Prostate cancer Father   . Lung disease Father        black lung  . Liver cancer Maternal Grandmother   . Liver disease Maternal Grandmother   . Polymyalgia rheumatica Sister   . Heart disease Sister   . Coronary artery disease Brother        heart attack  . COPD Brother   . Colon polyps Sister   . Lung cancer Paternal Grandmother   . Colon cancer Other        Both sides  . Early death Son    Social History   Socioeconomic History  . Marital status: Divorced    Spouse name: Not on file  . Number of children: 1  . Years of education: Not on file  . Highest education level: GED or equivalent  Occupational History  .  Occupation: Homemaker  Tobacco Use  . Smoking status: Former Smoker    Packs/day: 1.00    Years: 42.00    Pack years: 42.00    Types: Cigarettes    Quit date: 06/01/2015    Years since quitting: 5.6  . Smokeless tobacco: Never Used  Vaping Use  . Vaping Use: Never used  Substance and Sexual Activity  . Alcohol use: No    Alcohol/week: 0.0 standard drinks  . Drug use: No  . Sexual activity: Yes  Other Topics Concern  . Not on file  Social History Narrative   Lives alone.  One child.  Two grands.     Right handed   Social Determinants of Health   Financial Resource Strain: Not on file  Food Insecurity: Not on file  Transportation Needs: Not on file  Physical Activity: Not on file  Stress: Not on file  Social Connections: Not on file   Past Surgical History:  Procedure Laterality Date  . ABDOMINAL HYSTERECTOMY     age 61  . ANAL RECTAL MANOMETRY N/A 12/09/2015   Procedure: ANO RECTAL MANOMETRY;  Surgeon: Mauri Pole, MD;  Location: [facility];  Service: Endoscopy;  Laterality: N/A;  . BLADDER  REPAIR     sling/with perforation  . CESAREAN SECTION     1 time  . KNEE ARTHROSCOPY Left   . ruptured uterus    . SHOULDER ARTHROSCOPY     multiple   Past Surgical History:  Procedure Laterality Date  . ABDOMINAL HYSTERECTOMY     age 55  . ANAL RECTAL MANOMETRY N/A 12/09/2015   Procedure: ANO RECTAL MANOMETRY;  Surgeon: Mauri Pole, MD;  Location: [facility];  Service: Endoscopy;  Laterality: N/A;  . BLADDER REPAIR     sling/with perforation  . CESAREAN SECTION     1 time  . KNEE ARTHROSCOPY Left   . ruptured uterus    . SHOULDER ARTHROSCOPY     multiple   Past Medical History:  Diagnosis Date  . Anxiety   . Bowel habit changes    excessive gas /frequent BM's with change in shape with pus  . Chronic left shoulder pain    frozen   . Chronic pain    left upper shoulder ,neck and back  . COPD (chronic obstructive pulmonary disease) ([place])    Dr.  Redge Gainer  . Depression   . Emphysema lung ([place])   . GERD (gastroesophageal reflux disease)   . Hyperlipidemia   . IBS (irritable bowel syndrome)   . Maternal complication related to childbirth    Sepsis, staph  . Meniere's disease   . Nicotine addiction    2 packs per day   . Skin cancer    Rt. leg, squamous cell  . Tubular adenoma of colon 01/31/2000   Dr. Richmond Campbell  . Weakness of pelvic floor    prolapse -  MRI from 2017   BP 121/74   Pulse 74   Temp 98.3 F (36.8 C)   Ht 5\' 6"  (1.676 m)   Wt 157 lb (71.2 kg)   SpO2 98%   BMI 25.34 kg/m   Opioid Risk Score:   Fall Risk Score:  `1  Depression screen PHQ 2/9  Depression screen [facility] 2/9 11/06/2020 03/26/2020 02/06/2020 05/17/2019 04/22/2019 12/12/2018 11/26/2018  Decreased Interest 0 0 0 0 0 1 2  Down, Depressed, Hopeless 0 0 0 0 0 1 1  PHQ - 2 Score 0 0 0 0 0 2 3  Altered sleeping - - - - - - 3  Tired, decreased energy - - - - - - 3  Change in appetite - - - - - - 3  Feeling bad or failure about yourself  - - - - - - 0  Trouble concentrating - - - - - - 1  Moving slowly or fidgety/restless - - - - - - 0  Suicidal thoughts - - - - - - 0  PHQ-9 Score - - - - - - 13  Some recent data might be hidden     Review of Systems  Constitutional: Negative.   HENT: Negative.   Eyes: Negative.   Respiratory: Negative.   Cardiovascular: Negative.   Gastrointestinal: Negative.   Endocrine: Negative.   Genitourinary: Negative.   Musculoskeletal: Positive for back pain and neck pain.       LEFT ARM PAIN , RIGHT ELBOW  Skin: Negative.   Allergic/Immunologic: Negative.   Neurological: Negative.   Hematological: Negative.   Psychiatric/Behavioral: Negative.        Objective:   Physical Exam Vitals and nursing note reviewed.  Constitutional:      Appearance: Normal appearance.  Neck:     Comments:  Cervical Paraspinal Tenderness: C-5-C-6 Cardiovascular:     Rate and Rhythm: Normal rate and regular rhythm.      Pulses: Normal pulses.     Heart sounds: Normal heart sounds.  Pulmonary:     Effort: Pulmonary effort is normal.     Breath sounds: Normal breath sounds.  Musculoskeletal:     Cervical back: Normal range of motion and neck supple.     Comments: Normal Muscle Bulk and Muscle Testing Reveals:  Right: Upper Extremities:Full  ROM and Muscle Strength 5/5 Left Upper Extremity: Decreased ROM 30 Degrees  And Muscle Strength 4/5  Left AC Joint Tenderness  Thoracic Hypersensitivity: T-1-T-7 Lower Extremities: Full ROM and Muscle Strength 5/5 Arises from Table with ease Narrow Based Gait   Skin:    General: Skin is warm and dry.  Neurological:     Mental Status: She is alert and oriented to person, place, and time.  Psychiatric:        Mood and Affect: Mood normal.        Behavior: Behavior normal.           Assessment & Plan:  1.Post-traumatic arthritis left shoulder s/p 2 shoulder surgeries. Now with adhesive capsulitis left shoulder.01/14/2021. Refilled:Hydrocodone5.0/325mg  one tablet dailyas needed. #30. We will continue the opioid monitoring program, this consists of regular clinic visits, examinations, urine drug screen, pill counts as well as use of [place] Controlled Substance Reporting system. A 12 month History has been reviewed on the [place] Controlled Substance Reporting Systemon 01/14/2021. 2. Cervicalgia,Cervicalspondylosis/ Cervical Radiculitis: Continue Current Medication Regime.Robaxinand Gabapentindose changed to 100 mg BID, Gabapentin 300 mg discontinued due to daytime drowsiness.Marland Kitchen05/01/2021. 3. Depression/insomnia/anxiety: ContinueTo monitor..Dr. Kelby Fam.01/14/2021. 4. Myofascial Pain: Continue Robaxin. Continue to monitor.01/14/2021 5. Chronic Pain of Knees:No complaints today.Continue HEP as Tolerated. Continue to Monitor.01/14/2021. 6. ChronicLeft- SidedThoracic Back Pain: Continue HEP as Tolerated. Continue to Alternate  Ice and Heat Therapy. Continue current medication regimen.01/14/2021 7. Numbness and Tingling/ Paresthesia:No complaints today.Neurology Following.Continue to Monitor.01/14/2021 8. Diplopia:No complaints today. Ophthalmology following and  Continue to monitor.01/14/2021.  F/U in 2 months

## 2021-01-29 LAB — TOXASSURE SELECT,+ANTIDEPR,UR

## 2021-02-02 ENCOUNTER — Telehealth: Payer: Self-pay | Admitting: *Deleted

## 2021-02-02 NOTE — Telephone Encounter (Signed)
Urine drug screen for this encounter is consistent for being negative for  prescribed medication. She is no longer taking her hydrocodone on a regular basis.

## 2021-04-14 ENCOUNTER — Encounter: Payer: Medicare Other | Admitting: Registered Nurse

## 2021-05-20 ENCOUNTER — Encounter: Payer: Self-pay | Admitting: Family Medicine

## 2021-05-20 ENCOUNTER — Other Ambulatory Visit: Payer: Self-pay | Admitting: Family Medicine

## 2021-05-20 DIAGNOSIS — Z1231 Encounter for screening mammogram for malignant neoplasm of breast: Secondary | ICD-10-CM

## 2021-05-21 ENCOUNTER — Ambulatory Visit (INDEPENDENT_AMBULATORY_CARE_PROVIDER_SITE_OTHER): Payer: Medicare Other

## 2021-05-21 DIAGNOSIS — Z0000 Encounter for general adult medical examination without abnormal findings: Secondary | ICD-10-CM

## 2021-05-21 NOTE — Progress Notes (Signed)
MEDICARE ANNUAL WELLNESS VISIT  05/21/2021  Telephone Visit Disclaimer This Medicare AWV was conducted by telephone due to national recommendations for restrictions regarding the COVID-19 Pandemic (e.g. social distancing).  I verified, using two identifiers, that I am speaking with Cassandra [name] or their authorized healthcare agent. I discussed the limitations, risks, security, and privacy concerns of performing an evaluation and management service by telephone and the potential availability of an in-person appointment in the future. The patient expressed understanding and agreed to proceed.  Location of Patient: Home Location of Provider (nurse):  [facility]  Subjective:    Cassandra [name] is a 65 y.o. female patient of Stacks, Cletus Gash, MD who had a Medicare Annual Wellness Visit today via telephone. Cassandra [name] is Retired and lives with their spouse. she has one child. she reports that she is socially active and does interact with friends/family regularly. she is minimally physically active and enjoys cleaning.  Patient Care Team: [name], MD as PCP - General (Family Medicine) Minus Breeding, MD as PCP - Cardiology (Cardiology) Meredith Staggers, MD as Consulting Physician (Physical Medicine and Rehabilitation)  Advanced Directives 05/21/2021 04/08/2020 05/17/2019 04/03/2019 12/20/2017 06/12/2017 05/16/2017  Does Patient Have a Medical Advance Directive? No No No No No No No  Would patient like information on creating a medical advance directive? No - Patient declined - Yes (MAU/Ambulatory/Procedural Areas - Information given) No - Patient declined No - Patient declined - (No Data)    Hospital Utilization Over the Past 12 Months: # of hospitalizations or ER visits: 0 # of surgeries: 0  Review of Systems    Patient reports that her overall health is unchanged compared to last year.  Patient Reported Readings (BP, Pulse, CBG, Weight, etc) none  Pain  Assessment Pain : 0-10 Pain Score: 7  Pain Type: Chronic pain Pain Location: Shoulder Pain Orientation: Left, Right Pain Radiating Towards: neck and back Pain Descriptors / Indicators: Aching, Tingling Pain Onset: Other (comment) Pain Frequency: Constant Pain Relieving Factors: Heating pad and numbing creams, anti inflammatory  Pain Relieving Factors: Heating pad and numbing creams, anti inflammatory  Current Medications & Allergies (verified) Allergies as of 05/21/2021       Reactions   Chantix [varenicline] Rash   Ciprofloxacin    Codeine Itching, Nausea And Vomiting   Cymbalta [duloxetine Hcl] Nausea Only   Livalo [pitavastatin] Other (See Comments)   Seizures    Metoprolol Other (See Comments)   Had episode of possible hypotension or seizure described and muscles clamping down and searing pain toe to head and near fall x2. Loss of bowel control. No further symptoms after stopped.   Morphine And Related Hives   Itching hive rash   Oxycodone-aspirin Nausea And Vomiting   Oxycontin [oxycodone Hcl] Other (See Comments)   Altered mental status   Propoxyphene N-acetaminophen Nausea And Vomiting   Remeron [mirtazapine]    Shakes/tremors   Trazodone And Nefazodone Other (See Comments)   Medication is too strong   Zithromax [azithromycin] Other (See Comments)   Unknown    Other Rash   Lipturzet caused enlarged veins in legs   Oxycodone-acetaminophen Hives, Nausea And Vomiting, Rash   Septra [sulfamethoxazole-trimethoprim] Rash        Medication List        Accurate as of May 21, 2021  8:40 AM. If you have any questions, ask your nurse or doctor.          cholecalciferol 1000 units tablet Commonly  known as: VITAMIN D Take 1,000 Units by mouth daily.   conjugated estrogens 0.625 MG/GM vaginal cream Commonly known as: PREMARIN Place vaginally daily. What changed:  how much to take when to take this reasons to take this   fexofenadine 180 MG  tablet Commonly known as: ALLEGRA Take 1 tablet (180 mg total) by mouth daily as needed for allergies.   gabapentin 100 MG capsule Commonly known as: NEURONTIN Take 1 capsule (100 mg total) by mouth 2 (two) times daily.   HYDROcodone-acetaminophen 5-325 MG tablet Commonly known as: NORCO/VICODIN Take 1 tablet by mouth daily as needed for moderate pain.   hydroxypropyl methylcellulose / hypromellose 2.5 % ophthalmic solution Commonly known as: ISOPTO TEARS / GONIOVISC Place 1 drop into both eyes as needed for dry eyes.   meclizine 25 MG tablet Commonly known as: ANTIVERT TAKE 1 TABLET BY MOUTH TWO  TIMES DAILY AS NEEDED What changed:  how much to take how to take this when to take this reasons to take this additional instructions   methocarbamol 500 MG tablet Commonly known as: ROBAXIN Take 1 tablet (500 mg total) by mouth 2 (two) times daily.   methylPREDNISolone 4 MG Tbpk tablet Commonly known as: MEDROL DOSEPAK Use as directed   mirabegron ER 50 MG Tb24 tablet Commonly known as: Myrbetriq Take 1 tablet (50 mg total) by mouth daily.   nitrofurantoin 50 MG capsule Commonly known as: MACRODANTIN TAKE 1 CAPSULE BY MOUTH EVERY DAY IN THE EVENING   ondansetron 8 MG disintegrating tablet Commonly known as: ZOFRAN-ODT Take 1 tablet (8 mg total) by mouth every 6 (six) hours as needed for nausea or vomiting.        History (reviewed): Past Medical History:  Diagnosis Date   Anxiety    Bowel habit changes    excessive gas /frequent BM's with change in shape with pus   Chronic left shoulder pain    frozen    Chronic pain    left upper shoulder ,neck and back   COPD (chronic obstructive pulmonary disease) ([place])    Dr. Redge Gainer   Depression    Emphysema lung ([facility])    GERD (gastroesophageal reflux disease)    Hyperlipidemia    IBS (irritable bowel syndrome)    Maternal complication related to childbirth    Sepsis, staph   Meniere's disease    Nicotine  addiction    2 packs per day    Skin cancer    Rt. leg, squamous cell   Tubular adenoma of colon 01/31/2000   Dr. Dellis Filbert Medoff   Weakness of pelvic floor    prolapse -  MRI from 2017   Past Surgical History:  Procedure Laterality Date   ABDOMINAL HYSTERECTOMY     age 24   ANAL RECTAL MANOMETRY N/A 12/09/2015   Procedure: ANO RECTAL MANOMETRY;  Surgeon: Mauri Pole, MD;  Location: [facility];  Service: Endoscopy;  Laterality: N/A;   BLADDER REPAIR     sling/with perforation   CESAREAN SECTION     1 time   KNEE ARTHROSCOPY Left    ruptured uterus     SHOULDER ARTHROSCOPY     multiple   Family History  Problem Relation Age of Onset   Coronary artery disease Mother    Diabetes Mother        borderline   Kidney disease Mother        stage 4   Coronary artery disease Father    Prostate cancer Father  Lung disease Father        black lung   Liver cancer Maternal Grandmother    Liver disease Maternal Grandmother    Polymyalgia rheumatica Sister    Heart disease Sister    Coronary artery disease Brother        heart attack   COPD Brother    Colon polyps Sister    Lung cancer Paternal Grandmother    Colon cancer Other        Both sides   Early death Son    Social History   Socioeconomic History   Marital status: Divorced    Spouse name: Not on file   Number of children: 1   Years of education: Not on file   Highest education level: GED or equivalent  Occupational History   Occupation: Homemaker  Tobacco Use   Smoking status: Former    Packs/day: 1.00    Years: 42.00    Pack years: 42.00    Types: Cigarettes    Quit date: 06/01/2015    Years since quitting: 5.9   Smokeless tobacco: Never  Vaping Use   Vaping Use: Never used  Substance and Sexual Activity   Alcohol use: No    Alcohol/week: 0.0 standard drinks   Drug use: No   Sexual activity: Yes  Other Topics Concern   Not on file  Social History Narrative   Lives alone.  One child.  Two  grands.     Right handed   Social Determinants of Health   Financial Resource Strain: Not on file  Food Insecurity: Not on file  Transportation Needs: Not on file  Physical Activity: Not on file  Stress: Not on file  Social Connections: Not on file    Activities of Daily Living In your present state of health, do you have any difficulty performing the following activities: 05/21/2021  Hearing? N  Vision? N  Difficulty concentrating or making decisions? N  Walking or climbing stairs? N  Dressing or bathing? N  Doing errands, shopping? N  Preparing Food and eating ? N  Using the Toilet? N  In the past six months, have you accidently leaked urine? N  Do you have problems with loss of bowel control? N  Managing your Medications? N  Managing your Finances? N  Housekeeping or managing your Housekeeping? N  Some recent data might be hidden    Patient Education/ Literacy How often do you need to have someone help you when you read instructions, pamphlets, or other written materials from your doctor or pharmacy?: 1 - Never What is the last grade level you completed in school?: GED  Exercise Current Exercise Habits: Home exercise routine, Type of exercise: walking;stretching, Frequency (Times/Week): 5  Diet Patient reports consuming 2 meals a day and 2 snack(s) a day Patient reports that her primary diet is: Regular Patient reports that she does have regular access to food.   Depression Screen PHQ 2/9 Scores 01/14/2021 11/06/2020 03/26/2020 02/06/2020 05/17/2019 04/22/2019 12/12/2018  PHQ - 2 Score 0 0 0 0 0 0 2  PHQ- 9 Score 0 - - - - - -  Exception Documentation - - - - - - -  Not completed - - - - - - -     Fall Risk Fall Risk  05/21/2021 01/14/2021 11/06/2020 08/27/2020 04/08/2020  Falls in the past year? 0 1 0 1 0  Comment - - - - -  Number falls in past yr: - 0 0 0 0  Comment - - - - -  Injury with Fall? - 1 0 0 0  Comment - - - - -  Risk Factor Category  - - - - -  Comment - -  - - -  Risk for fall due to : - - - - -  Risk for fall due to: Comment - - - - -  Follow up - - - - -  Comment - - - - -     Objective:  Cassandra [name] seemed alert and oriented and she participated appropriately during our telephone visit.  Blood Pressure Weight BMI  BP Readings from Last 3 Encounters:  01/14/21 121/74  11/06/20 130/74  08/27/20 110/65   Wt Readings from Last 3 Encounters:  01/14/21 157 lb (71.2 kg)  11/06/20 155 lb 6.4 oz (70.5 kg)  08/27/20 153 lb (69.4 kg)   BMI Readings from Last 1 Encounters:  01/14/21 25.34 kg/m    *Unable to obtain current vital signs, weight, and BMI due to telephone visit type  Hearing/Vision  Idamay did not seem to have difficulty with hearing/understanding during the telephone conversation Reports that she has had a formal eye exam by an eye care professional within the past year Reports that she has not had a formal hearing evaluation within the past year *Unable to fully assess hearing and vision during telephone visit type  Cognitive Function: 6CIT Screen 05/21/2021 05/17/2019  What Year? 0 points 0 points  What month? 0 points 0 points  What time? 0 points 0 points  Count back from 20 0 points 0 points  Months in reverse 0 points 0 points  Repeat phrase 0 points 0 points  Total Score 0 0   (Normal:0-7, Significant for Dysfunction: >8)  Normal Cognitive Function Screening: Yes   Immunization & Health Maintenance Record Immunization History  Administered Date(s) Administered   Influenza Split 06/21/2010   Influenza, Seasonal, Injecte, Preservative Fre 06/25/2014   Influenza,inj,Quad PF,6+ Mos 07/09/2013, 06/09/2015, 06/27/2016, 06/20/2017, 06/20/2018   Moderna Sars-Covid-2 Vaccination 12/26/2019, 01/28/2020   Td 05/13/2008    Health Maintenance  Topic Date Due   Zoster Vaccines- Shingrix (1 of 2) Never done   TETANUS/TDAP  05/13/2018   PAP SMEAR-Modifier  03/25/2019   MAMMOGRAM  10/11/2019   COVID-19 Vaccine (3 -  Moderna risk series) 02/25/2020   PNA vac Low Risk Adult (1 of 2 - PCV13) Never done   INFLUENZA VACCINE  04/12/2021   COLONOSCOPY (Pts 45-50yr Insurance coverage will need to be confirmed)  08/04/2025   DEXA SCAN  Completed   Hepatitis C Screening  Completed   HIV Screening  Completed   HPV VACCINES  Aged Out       Assessment  This is a routine wellness examination for [employer]  Health Maintenance: Due or Overdue Health Maintenance Due  Topic Date Due   Zoster Vaccines- Shingrix (1 of 2) Never done   TETANUS/TDAP  05/13/2018   PAP SMEAR-Modifier  03/25/2019   MAMMOGRAM  10/11/2019   COVID-19 Vaccine (3 - Moderna risk series) 02/25/2020   PNA vac Low Risk Adult (1 of 2 - PCV13) Never done   INFLUENZA VACCINE  04/12/2021    PSuszanne Finchdoes not need a referral for Community Assistance: Care Management:   no Social Work:    no Prescription Assistance:  no Nutrition/Diabetes Education:  no   Plan:  Personalized Goals  Goals Addressed  This Visit's Progress    AWV   On track    05/17/2019 AWV Goal: Fall Prevention  Over the next year, patient will decrease their risk for falls by: Using assistive devices, such as a cane or walker, as needed Identifying fall risks within their home and correcting them by: Removing throw rugs Adding handrails to stairs or ramps Removing clutter and keeping a clear pathway throughout the home Increasing light, especially at night Adding shower handles/bars Raising toilet seat Identifying potential personal risk factors for falls: Medication side effects Incontinence/urgency Vestibular dysfunction Hearing loss Musculoskeletal disorders Neurological disorders Orthostatic hypotension       DIET - EAT MORE FRUITS AND VEGETABLES   On track    Exercise 150 min/wk Moderate Activity   On track      Personalized Health Maintenance & Screening Recommendations  Pneumococcal vaccine  Screening  mammography Screening Pap smear and pelvic exam  Shingles  Lung Cancer Screening Recommended: no (Low Dose CT Chest recommended if Age 63-80 years, 30 pack-year currently smoking OR have quit w/in past 15 years) Hepatitis C Screening recommended: no HIV Screening recommended: no  Advanced Directives: Written information was not prepared per patient's request.  Referrals & Orders No orders of the defined types were placed in this encounter.   Follow-up Plan Follow-up with [name], MD as planned Schedule 07/28/2021    I have personally reviewed and noted the following in the patient's chart:   Medical and social history Use of alcohol, tobacco or illicit drugs  Current medications and supplements Functional ability and status Nutritional status Physical activity Advanced directives List of other physicians Hospitalizations, surgeries, and ER visits in previous 12 months Vitals Screenings to include cognitive, depression, and falls Referrals and appointments  In addition, I have reviewed and discussed with Cassandra [name] certain preventive protocols, quality metrics, and best practice recommendations. A written personalized care plan for preventive services as well as general preventive health recommendations is available and can be mailed to the patient at her request.      [facility]  [phone]

## 2021-05-21 NOTE — Patient Instructions (Signed)
  [facility] Maintenance Summary and Written Plan of Care  Cassandra [name] ,  Thank you for allowing me to perform your Medicare Annual Wellness Visit and for your ongoing commitment to your health.   Health Maintenance & Immunization History Health Maintenance  Topic Date Due   Zoster Vaccines- Shingrix (1 of 2) Never done   TETANUS/TDAP  05/13/2018   PAP SMEAR-Modifier  03/25/2019   MAMMOGRAM  10/11/2019   COVID-19 Vaccine (3 - Moderna risk series) 02/25/2020   PNA vac Low Risk Adult (1 of 2 - PCV13) Never done   INFLUENZA VACCINE  04/12/2021   COLONOSCOPY (Pts 45-14yr Insurance coverage will need to be confirmed)  08/04/2025   DEXA SCAN  Completed   Hepatitis C Screening  Completed   HIV Screening  Completed   HPV VACCINES  Aged Out   Immunization History  Administered Date(s) Administered   Influenza Split 06/21/2010   Influenza, Seasonal, Injecte, Preservative Fre 06/25/2014   Influenza,inj,Quad PF,6+ Mos 07/09/2013, 06/09/2015, 06/27/2016, 06/20/2017, 06/20/2018   Moderna Sars-Covid-2 Vaccination 12/26/2019, 01/28/2020   Td 05/13/2008    These are the patient goals that we discussed:  Goals Addressed             This Visit's Progress    AWV   On track    05/17/2019 AWV Goal: Fall Prevention  Over the next year, patient will decrease their risk for falls by: Using assistive devices, such as a cane or walker, as needed Identifying fall risks within their home and correcting them by: Removing throw rugs Adding handrails to stairs or ramps Removing clutter and keeping a clear pathway throughout the home Increasing light, especially at night Adding shower handles/bars Raising toilet seat Identifying potential personal risk factors for falls: Medication side effects Incontinence/urgency Vestibular dysfunction Hearing loss Musculoskeletal disorders Neurological disorders Orthostatic hypotension       DIET - EAT MORE FRUITS AND  VEGETABLES   On track    Exercise 150 min/wk Moderate Activity   On track        This is a list of Health Maintenance Items that are overdue or due now: Health Maintenance Due  Topic Date Due   Zoster Vaccines- Shingrix (1 of 2) Never done   TETANUS/TDAP  05/13/2018   PAP SMEAR-Modifier  03/25/2019   MAMMOGRAM  10/11/2019   COVID-19 Vaccine (3 - Moderna risk series) 02/25/2020   PNA vac Low Risk Adult (1 of 2 - PCV13) Never done   INFLUENZA VACCINE  04/12/2021     Orders/Referrals Placed Today: No orders of the defined types were placed in this encounter.  (Contact our referral department at [phone] you have not spoken with someone about your referral appointment within the next 5 days)    Follow-up Plan  Scheduled with Dr. SLivia Snellenon 07/28/21 at 1:40 pm.

## 2021-05-26 ENCOUNTER — Encounter: Payer: Medicare Other | Admitting: Registered Nurse

## 2021-07-02 ENCOUNTER — Ambulatory Visit (INDEPENDENT_AMBULATORY_CARE_PROVIDER_SITE_OTHER): Payer: Medicare Other | Admitting: Family Medicine

## 2021-07-02 ENCOUNTER — Encounter: Payer: Self-pay | Admitting: Family Medicine

## 2021-07-02 DIAGNOSIS — J069 Acute upper respiratory infection, unspecified: Secondary | ICD-10-CM | POA: Diagnosis not present

## 2021-07-02 MED ORDER — PSEUDOEPH-BROMPHEN-DM 30-2-10 MG/5ML PO SYRP: 5.0000 mL | ORAL_SOLUTION | Freq: Four times a day (QID) | ORAL | 0 refills | Status: DC | PRN

## 2021-07-02 NOTE — Progress Notes (Signed)
Virtual Visit via telephone Note Due to COVID-19 pandemic this visit was conducted virtually. This visit type was conducted due to national recommendations for restrictions regarding the COVID-19 Pandemic (e.g. social distancing, sheltering in place) in an effort to limit this patient's exposure and mitigate transmission in our community. All issues noted in this document were discussed and addressed.  A physical exam was not performed with this format.   I connected with [name] on 07/02/2021 at 1055 by telephone and verified that I am speaking with the correct person using two identifiers. Cassandra [name] is currently located at home and  no one  is currently with them during visit. The provider, Monia Pouch, FNP is located in their office at time of visit.  I discussed the limitations, risks, security and privacy concerns of performing an evaluation and management service by telephone and the availability of in person appointments. I also discussed with the patient that there may be a patient responsible charge related to this service. The patient expressed understanding and agreed to proceed.  Subjective:  Patient ID: Cassandra [name], female    DOB: 11-25-1955, 65 y.o.   MRN: [id number]  Chief Complaint:  Cough   HPI: Cassandra [name] is a 65 y.o. female presenting on 07/02/2021 for Cough   Pt reports cough, congestion, ear pressure, and malaise since Tuesday. No fever, chills, weakness, or confusion. No shortness of breath. She does have green nasal discharge. She took a home COVID test and reports it was negative but this was at onset of symptoms.   Cough This is a new problem. The current episode started in the past 7 days. The problem has been unchanged. The problem occurs constantly. The cough is Productive of sputum. Associated symptoms include ear congestion, headaches, nasal congestion, postnasal drip, rhinorrhea and a sore throat. Pertinent negatives include no chest pain,  chills, ear pain, fever, heartburn, hemoptysis, myalgias, rash, shortness of breath, sweats, weight loss or wheezing. Nothing aggravates the symptoms. She has tried OTC cough suppressant and rest for the symptoms. The treatment provided mild relief. Her past medical history is significant for emphysema.    Relevant past medical, surgical, family, and social history reviewed and updated as indicated.  Allergies and medications reviewed and updated.   Past Medical History:  Diagnosis Date   Anxiety    Bowel habit changes    excessive gas /frequent BM's with change in shape with pus   Chronic left shoulder pain    frozen    Chronic pain    left upper shoulder ,neck and back   COPD (chronic obstructive pulmonary disease) ([place])    Dr. Redge Gainer   Depression    Emphysema lung (HCC)    GERD (gastroesophageal reflux disease)    Hyperlipidemia    IBS (irritable bowel syndrome)    Maternal complication related to childbirth    Sepsis, staph   Meniere's disease    Nicotine addiction    2 packs per day    Skin cancer    Rt. leg, squamous cell   Tubular adenoma of colon 01/31/2000   Dr. Dellis Filbert Medoff   Weakness of pelvic floor    prolapse -  MRI from 2017    Past Surgical History:  Procedure Laterality Date   ABDOMINAL HYSTERECTOMY     age 65   ANAL RECTAL MANOMETRY N/A 12/09/2015   Procedure: ANO RECTAL MANOMETRY;  Surgeon: Mauri Pole, MD;  Location: [facility];  Service: Endoscopy;  Laterality: N/A;   BLADDER REPAIR     sling/with perforation   CESAREAN SECTION     1 time   KNEE ARTHROSCOPY Left    ruptured uterus     SHOULDER ARTHROSCOPY     multiple    Social History   Socioeconomic History   Marital status: Divorced    Spouse name: Not on file   Number of children: 1   Years of education: Not on file   Highest education level: GED or equivalent  Occupational History   Occupation: Homemaker  Tobacco Use   Smoking status: Former    Packs/day: 1.00     Years: 42.00    Pack years: 42.00    Types: Cigarettes    Quit date: 06/01/2015    Years since quitting: 6.0   Smokeless tobacco: Never  Vaping Use   Vaping Use: Never used  Substance and Sexual Activity   Alcohol use: No    Alcohol/week: 0.0 standard drinks   Drug use: No   Sexual activity: Yes  Other Topics Concern   Not on file  Social History Narrative   Lives alone.  One child.  Two grands.     Right handed   Social Determinants of Health   Financial Resource Strain: Not on file  Food Insecurity: Not on file  Transportation Needs: Not on file  Physical Activity: Not on file  Stress: Not on file  Social Connections: Not on file  Intimate Partner Violence: Not on file    Outpatient Encounter Medications as of 07/02/2021  Medication Sig   brompheniramine-pseudoephedrine-DM 30-2-10 MG/5ML syrup Take 5 mLs by mouth 4 (four) times daily as needed.   cholecalciferol (VITAMIN D) 1000 units tablet Take 1,000 Units by mouth daily.   conjugated estrogens (PREMARIN) vaginal cream Place vaginally daily. (Patient taking differently: Place 1 Applicatorful vaginally daily as needed.)   fexofenadine (ALLEGRA) 180 MG tablet Take 1 tablet (180 mg total) by mouth daily as needed for allergies.   gabapentin (NEURONTIN) 100 MG capsule Take 1 capsule (100 mg total) by mouth 2 (two) times daily.   HYDROcodone-acetaminophen (NORCO/VICODIN) 5-325 MG tablet Take 1 tablet by mouth daily as needed for moderate pain.   hydroxypropyl methylcellulose / hypromellose (ISOPTO TEARS / GONIOVISC) 2.5 % ophthalmic solution Place 1 drop into both eyes as needed for dry eyes.   meclizine (ANTIVERT) 25 MG tablet TAKE 1 TABLET BY MOUTH TWO  TIMES DAILY AS NEEDED (Patient taking differently: Take 25 mg by mouth 2 (two) times daily as needed for dizziness or nausea.)   methocarbamol (ROBAXIN) 500 MG tablet Take 1 tablet (500 mg total) by mouth 2 (two) times daily.   methylPREDNISolone (MEDROL DOSEPAK) 4 MG TBPK  tablet Use as directed   mirabegron ER (MYRBETRIQ) 50 MG TB24 tablet Take 1 tablet (50 mg total) by mouth daily.   nitrofurantoin (MACRODANTIN) 50 MG capsule TAKE 1 CAPSULE BY MOUTH EVERY DAY IN THE EVENING   ondansetron (ZOFRAN-ODT) 8 MG disintegrating tablet Take 1 tablet (8 mg total) by mouth every 6 (six) hours as needed for nausea or vomiting.   No facility-administered encounter medications on file as of 07/02/2021.    Allergies  Allergen Reactions   Chantix [Varenicline] Rash   Ciprofloxacin    Codeine Itching and Nausea And Vomiting   Cymbalta [Duloxetine Hcl] Nausea Only   Livalo [Pitavastatin] Other (See Comments)    Seizures    Metoprolol Other (See Comments)    Had episode of possible hypotension or  seizure described and muscles clamping down and searing pain toe to head and near fall x2. Loss of bowel control. No further symptoms after stopped.   Morphine And Related Hives    Itching hive rash   Oxycodone-Aspirin Nausea And Vomiting   Oxycontin [Oxycodone Hcl] Other (See Comments)    Altered mental status   Propoxyphene N-Acetaminophen Nausea And Vomiting   Remeron [Mirtazapine]     Shakes/tremors   Trazodone And Nefazodone Other (See Comments)    Medication is too strong   Zithromax [Azithromycin] Other (See Comments)    Unknown    Other Rash    Lipturzet caused enlarged veins in legs   Oxycodone-Acetaminophen Hives, Nausea And Vomiting and Rash   Septra [Sulfamethoxazole-Trimethoprim] Rash    Review of Systems  Constitutional:  Positive for activity change and appetite change. Negative for chills, diaphoresis, fatigue, fever, unexpected weight change and weight loss.  HENT:  Positive for congestion, postnasal drip, rhinorrhea and sore throat. Negative for dental problem, drooling, ear discharge, ear pain, facial swelling, hearing loss, mouth sores, nosebleeds, sinus pressure, sneezing, tinnitus, trouble swallowing and voice change.   Respiratory:  Positive for  cough. Negative for apnea, hemoptysis, choking, shortness of breath, wheezing and stridor.   Cardiovascular:  Negative for chest pain.  Gastrointestinal:  Negative for abdominal pain and heartburn.  Genitourinary:  Negative for decreased urine volume and difficulty urinating.  Musculoskeletal:  Negative for arthralgias and myalgias.  Skin:  Negative for rash.  Neurological:  Positive for headaches. Negative for dizziness, tremors, seizures, syncope, facial asymmetry, speech difficulty, weakness, light-headedness and numbness.  Psychiatric/Behavioral:  Negative for confusion.   All other systems reviewed and are negative.       Observations/Objective: No vital signs or physical exam, this was a telephone or virtual health encounter.  Pt alert and oriented, answers all questions appropriately, and able to speak in full sentences.    Assessment and Plan: Shoshanah was seen today for cough.  Diagnoses and all orders for this visit:  URI with cough and congestion Reported symptoms consistent with viral illness. Home COVID test was negative, will obtain send off testing today. No indications of acute bacterial infection. Will provide Bromfed for short term to help with symptom relief. Pt aware to report new, worsening, or persistent symptoms.  -     Novel Coronavirus, NAA ([facility]) -     brompheniramine-pseudoephedrine-DM 30-2-10 MG/5ML syrup; Take 5 mLs by mouth 4 (four) times daily as needed.    Follow Up Instructions: Return if symptoms worsen or fail to improve.    I discussed the assessment and treatment plan with the patient. The patient was provided an opportunity to ask questions and all were answered. The patient agreed with the plan and demonstrated an understanding of the instructions.   The patient was advised to call back or seek an in-person evaluation if the symptoms worsen or if the condition fails to improve as anticipated.  The above assessment and management plan was  discussed with the patient. The patient verbalized understanding of and has agreed to the management plan. Patient is aware to call the clinic if they develop any new symptoms or if symptoms persist or worsen. Patient is aware when to return to the clinic for a follow-up visit. Patient educated on when it is appropriate to go to the emergency department.    I provided 12 minutes of non-face-to-face time during this encounter. The call started at 1055. The call ended at 1105. The other time  was used for coordination of care.    Monia Pouch, FNP-C [facility] [address] [place], [place] [address] [phone] 07/02/2021

## 2021-07-03 LAB — SARS-COV-2, NAA 2 DAY TAT

## 2021-07-03 LAB — NOVEL CORONAVIRUS, NAA: SARS-CoV-2, NAA: NOT DETECTED

## 2021-07-16 ENCOUNTER — Ambulatory Visit (INDEPENDENT_AMBULATORY_CARE_PROVIDER_SITE_OTHER): Payer: Medicare Other | Admitting: Family Medicine

## 2021-07-16 ENCOUNTER — Encounter: Payer: Self-pay | Admitting: Family Medicine

## 2021-07-16 DIAGNOSIS — J069 Acute upper respiratory infection, unspecified: Secondary | ICD-10-CM | POA: Diagnosis not present

## 2021-07-16 DIAGNOSIS — J0140 Acute pansinusitis, unspecified: Secondary | ICD-10-CM | POA: Diagnosis not present

## 2021-07-16 MED ORDER — ALBUTEROL SULFATE HFA 108 (90 BASE) MCG/ACT IN AERS: 2.0000 | INHALATION_SPRAY | Freq: Four times a day (QID) | RESPIRATORY_TRACT | 2 refills | Status: DC | PRN

## 2021-07-16 MED ORDER — DOXYCYCLINE HYCLATE 100 MG PO TABS: 100.0000 mg | ORAL_TABLET | Freq: Two times a day (BID) | ORAL | 0 refills | Status: AC

## 2021-07-16 NOTE — Progress Notes (Signed)
Virtual Visit via telephone Note Due to COVID-19 pandemic this visit was conducted virtually. This visit type was conducted due to national recommendations for restrictions regarding the COVID-19 Pandemic (e.g. social distancing, sheltering in place) in an effort to limit this patient's exposure and mitigate transmission in our community. All issues noted in this document were discussed and addressed.  A physical exam was not performed with this format.   I connected with Cassandra [name] on 07/16/2021 at 0915 by telephone and verified that I am speaking with the correct person using two identifiers. Cassandra [name] is currently located at home and family is currently with them during visit. The provider, Monia Pouch, FNP is located in their office at time of visit.  I discussed the limitations, risks, security and privacy concerns of performing an evaluation and management service by telephone and the availability of in person appointments. I also discussed with the patient that there may be a patient responsible charge related to this service. The patient expressed understanding and agreed to proceed.  Subjective:  Patient ID: Cassandra [name], female    DOB: [birth year], 65 y.o.   MRN: [id number]  Chief Complaint:  Sinus Problem and Cough   HPI: Cassandra [name] is a 65 y.o. female presenting on 07/16/2021 for Sinus Problem and Cough   Pt reports ongoing URI symptoms despite symptomatic care at home. She had a telephone visit 07/02/2021 for same. COVID testing was negative. She was placedon Bromfed with some relief of symptoms but states she is not getting well.   Sinus Problem This is a recurrent problem. The current episode started 1 to 4 weeks ago. The problem has been gradually worsening since onset. Her pain is at a severity of 4/10. The pain is mild. Associated symptoms include chills, congestion, coughing, headaches and sinus pressure. Pertinent negatives include no diaphoresis, ear pain, hoarse  voice, neck pain, shortness of breath, sneezing, sore throat or swollen glands. Past treatments include oral decongestants and acetaminophen. The treatment provided no relief.  Cough This is a recurrent problem. The current episode started 1 to 4 weeks ago. The problem has been gradually worsening. The problem occurs every few minutes. The cough is Productive of purulent sputum. Associated symptoms include chills, headaches, nasal congestion, postnasal drip, rhinorrhea and wheezing. Pertinent negatives include no chest pain, ear congestion, ear pain, fever, heartburn, hemoptysis, myalgias, rash, sore throat, shortness of breath, sweats or weight loss. She has tried prescription cough suppressant for the symptoms. The treatment provided mild relief.    Relevant past medical, surgical, family, and social history reviewed and updated as indicated.  Allergies and medications reviewed and updated.   Past Medical History:  Diagnosis Date   Anxiety    Bowel habit changes    excessive gas /frequent BM's with change in shape with pus   Chronic left shoulder pain    frozen    Chronic pain    left upper shoulder ,neck and back   COPD (chronic obstructive pulmonary disease) ([place])    Dr. Redge Gainer   Depression    Emphysema lung (HCC)    GERD (gastroesophageal reflux disease)    Hyperlipidemia    IBS (irritable bowel syndrome)    Maternal complication related to childbirth    Sepsis, staph   Meniere's disease    Nicotine addiction    2 packs per day    Skin cancer    Rt. leg, squamous cell   Tubular adenoma of colon 01/31/2000  Dr. Dellis Filbert Medoff   Weakness of pelvic floor    prolapse -  MRI from 2017    Past Surgical History:  Procedure Laterality Date   ABDOMINAL HYSTERECTOMY     age 52   ANAL RECTAL MANOMETRY N/A 12/09/2015   Procedure: ANO RECTAL MANOMETRY;  Surgeon: Mauri Pole, MD;  Location: [facility];  Service: Endoscopy;  Laterality: N/A;   BLADDER REPAIR      sling/with perforation   CESAREAN SECTION     1 time   KNEE ARTHROSCOPY Left    ruptured uterus     SHOULDER ARTHROSCOPY     multiple    Social History   Socioeconomic History   Marital status: Divorced    Spouse name: Not on file   Number of children: 1   Years of education: Not on file   Highest education level: GED or equivalent  Occupational History   Occupation: Homemaker  Tobacco Use   Smoking status: Former    Packs/day: 1.00    Years: 42.00    Pack years: 42.00    Types: Cigarettes    Quit date: 06/01/2015    Years since quitting: 6.1   Smokeless tobacco: Never  Vaping Use   Vaping Use: Never used  Substance and Sexual Activity   Alcohol use: No    Alcohol/week: 0.0 standard drinks   Drug use: No   Sexual activity: Yes  Other Topics Concern   Not on file  Social History Narrative   Lives alone.  One child.  Two grands.     Right handed   Social Determinants of Health   Financial Resource Strain: Not on file  Food Insecurity: Not on file  Transportation Needs: Not on file  Physical Activity: Not on file  Stress: Not on file  Social Connections: Not on file  Intimate Partner Violence: Not on file    Outpatient Encounter Medications as of 07/16/2021  Medication Sig   albuterol (VENTOLIN HFA) 108 (90 Base) MCG/ACT inhaler Inhale 2 puffs into the lungs every 6 (six) hours as needed for wheezing or shortness of breath.   doxycycline (VIBRA-TABS) 100 MG tablet Take 1 tablet (100 mg total) by mouth 2 (two) times daily for 10 days. 1 po bid   brompheniramine-pseudoephedrine-DM 30-2-10 MG/5ML syrup Take 5 mLs by mouth 4 (four) times daily as needed.   cholecalciferol (VITAMIN D) 1000 units tablet Take 1,000 Units by mouth daily.   conjugated estrogens (PREMARIN) vaginal cream Place vaginally daily. (Patient taking differently: Place 1 Applicatorful vaginally daily as needed.)   fexofenadine (ALLEGRA) 180 MG tablet Take 1 tablet (180 mg total) by mouth daily as  needed for allergies.   gabapentin (NEURONTIN) 100 MG capsule Take 1 capsule (100 mg total) by mouth 2 (two) times daily.   HYDROcodone-acetaminophen (NORCO/VICODIN) 5-325 MG tablet Take 1 tablet by mouth daily as needed for moderate pain.   hydroxypropyl methylcellulose / hypromellose (ISOPTO TEARS / GONIOVISC) 2.5 % ophthalmic solution Place 1 drop into both eyes as needed for dry eyes.   meclizine (ANTIVERT) 25 MG tablet TAKE 1 TABLET BY MOUTH TWO  TIMES DAILY AS NEEDED (Patient taking differently: Take 25 mg by mouth 2 (two) times daily as needed for dizziness or nausea.)   methocarbamol (ROBAXIN) 500 MG tablet Take 1 tablet (500 mg total) by mouth 2 (two) times daily.   methylPREDNISolone (MEDROL DOSEPAK) 4 MG TBPK tablet Use as directed   mirabegron ER (MYRBETRIQ) 50 MG TB24 tablet Take 1 tablet (  50 mg total) by mouth daily.   nitrofurantoin (MACRODANTIN) 50 MG capsule TAKE 1 CAPSULE BY MOUTH EVERY DAY IN THE EVENING   ondansetron (ZOFRAN-ODT) 8 MG disintegrating tablet Take 1 tablet (8 mg total) by mouth every 6 (six) hours as needed for nausea or vomiting.   No facility-administered encounter medications on file as of 07/16/2021.    Allergies  Allergen Reactions   Chantix [Varenicline] Rash   Ciprofloxacin    Codeine Itching and Nausea And Vomiting   Cymbalta [Duloxetine Hcl] Nausea Only   Livalo [Pitavastatin] Other (See Comments)    Seizures    Metoprolol Other (See Comments)    Had episode of possible hypotension or seizure described and muscles clamping down and searing pain toe to head and near fall x2. Loss of bowel control. No further symptoms after stopped.   Morphine And Related Hives    Itching hive rash   Oxycodone-Aspirin Nausea And Vomiting   Oxycontin [Oxycodone Hcl] Other (See Comments)    Altered mental status   Propoxyphene N-Acetaminophen Nausea And Vomiting   Remeron [Mirtazapine]     Shakes/tremors   Trazodone And Nefazodone Other (See Comments)     Medication is too strong   Zithromax [Azithromycin] Other (See Comments)    Unknown    Other Rash    Lipturzet caused enlarged veins in legs   Oxycodone-Acetaminophen Hives, Nausea And Vomiting and Rash   Septra [Sulfamethoxazole-Trimethoprim] Rash    Review of Systems  Constitutional:  Positive for activity change, appetite change, chills and fatigue. Negative for diaphoresis, fever, unexpected weight change and weight loss.  HENT:  Positive for congestion, postnasal drip, rhinorrhea, sinus pressure and sinus pain. Negative for ear pain, hoarse voice, sneezing, sore throat, tinnitus, trouble swallowing and voice change.   Respiratory:  Positive for cough and wheezing. Negative for hemoptysis and shortness of breath.   Cardiovascular:  Negative for chest pain.  Gastrointestinal:  Negative for abdominal pain, constipation, diarrhea, heartburn, nausea and vomiting.  Genitourinary:  Negative for decreased urine volume and difficulty urinating.  Musculoskeletal:  Negative for myalgias and neck pain.  Skin:  Negative for rash.  Neurological:  Positive for headaches. Negative for dizziness, tremors, seizures, syncope, facial asymmetry, speech difficulty, weakness, light-headedness and numbness.  Psychiatric/Behavioral:  Negative for confusion.   All other systems reviewed and are negative.       Observations/Objective: No vital signs or physical exam, this was a telephone or virtual health encounter.  Pt alert and oriented, answers all questions appropriately, and able to speak in full sentences.    Assessment and Plan: Hermela was seen today for sinus problem and cough.  Diagnoses and all orders for this visit:  URI with cough and congestion Acute non-recurrent pansinusitis Ongoing and worsening symptoms despite symptomatic care over the last 2 weeks at home. COVID testing negative. Will initiate doxycycline. Albuterol as needed for cough and wheezing. Report any new, worsening, or  persistent symptoms.   -     doxycycline (VIBRA-TABS) 100 MG tablet; Take 1 tablet (100 mg total) by mouth 2 (two) times daily for 10 days. 1 po bid -     albuterol (VENTOLIN HFA) 108 (90 Base) MCG/ACT inhaler; Inhale 2 puffs into the lungs every 6 (six) hours as needed for wheezing or shortness of breath.   Follow Up Instructions: Return if symptoms worsen or fail to improve.    I discussed the assessment and treatment plan with the patient. The patient was provided an opportunity to ask  questions and all were answered. The patient agreed with the plan and demonstrated an understanding of the instructions.   The patient was advised to call back or seek an in-person evaluation if the symptoms worsen or if the condition fails to improve as anticipated.  The above assessment and management plan was discussed with the patient. The patient verbalized understanding of and has agreed to the management plan. Patient is aware to call the clinic if they develop any new symptoms or if symptoms persist or worsen. Patient is aware when to return to the clinic for a follow-up visit. Patient educated on when it is appropriate to go to the emergency department.    I provided 15 minutes of non-face-to-face time during this encounter. The call started at 0915. The call ended at 0930. The other time was used for coordination of care.    Monia Pouch, FNP-C [facility] [address] [place], [place] [phone] 07/16/2021

## 2021-07-19 ENCOUNTER — Other Ambulatory Visit: Payer: Self-pay

## 2021-07-19 ENCOUNTER — Ambulatory Visit
Admission: RE | Admit: 2021-07-19 | Discharge: 2021-07-19 | Disposition: A | Discharge status: Home / Self Care | Payer: Medicare Other | Source: Ambulatory Visit | Attending: Family Medicine | Admitting: Family Medicine

## 2021-07-19 DIAGNOSIS — Z1231 Encounter for screening mammogram for malignant neoplasm of breast: Secondary | ICD-10-CM

## 2021-07-26 ENCOUNTER — Telehealth: Payer: Self-pay | Admitting: Family Medicine

## 2021-07-26 NOTE — Telephone Encounter (Signed)
Pt has had 2 OV for this this: 10/21 and 11/4 both with [facility]   Please address what she can try next.

## 2021-07-26 NOTE — Telephone Encounter (Signed)
If she is afebrile, she can, and should come to her appointment

## 2021-07-26 NOTE — Telephone Encounter (Signed)
Called patient, no answer, left detailed voice message 

## 2021-07-28 ENCOUNTER — Ambulatory Visit: Payer: Medicare Other | Admitting: Family Medicine

## 2021-08-10 ENCOUNTER — Ambulatory Visit: Payer: Medicare Other | Admitting: Family Medicine

## 2021-08-12 ENCOUNTER — Other Ambulatory Visit: Payer: Self-pay

## 2021-08-12 ENCOUNTER — Encounter: Payer: Self-pay | Admitting: Family Medicine

## 2021-08-12 ENCOUNTER — Other Ambulatory Visit: Payer: Self-pay | Admitting: Family Medicine

## 2021-08-12 ENCOUNTER — Ambulatory Visit (INDEPENDENT_AMBULATORY_CARE_PROVIDER_SITE_OTHER): Payer: Medicare Other | Admitting: Family Medicine

## 2021-08-12 VITALS — BP 112/65 | HR 80 | Temp 97.6°F | Ht 66.0 in | Wt 156.8 lb

## 2021-08-12 DIAGNOSIS — H8109 Meniere's disease, unspecified ear: Secondary | ICD-10-CM | POA: Diagnosis not present

## 2021-08-12 DIAGNOSIS — R6889 Other general symptoms and signs: Secondary | ICD-10-CM | POA: Diagnosis not present

## 2021-08-12 DIAGNOSIS — G4700 Insomnia, unspecified: Secondary | ICD-10-CM

## 2021-08-12 DIAGNOSIS — E785 Hyperlipidemia, unspecified: Secondary | ICD-10-CM | POA: Diagnosis not present

## 2021-08-12 DIAGNOSIS — Z0001 Encounter for general adult medical examination with abnormal findings: Secondary | ICD-10-CM | POA: Diagnosis not present

## 2021-08-12 DIAGNOSIS — J439 Emphysema, unspecified: Secondary | ICD-10-CM

## 2021-08-12 DIAGNOSIS — Z0000 Encounter for general adult medical examination without abnormal findings: Secondary | ICD-10-CM

## 2021-08-12 DIAGNOSIS — I700 Atherosclerosis of aorta: Secondary | ICD-10-CM | POA: Diagnosis not present

## 2021-08-12 LAB — URINALYSIS
Bilirubin, UA: NEGATIVE
Glucose, UA: NEGATIVE
Leukocytes,UA: NEGATIVE
Nitrite, UA: POSITIVE — AB
Protein,UA: NEGATIVE
RBC, UA: NEGATIVE
Specific Gravity, UA: 1.025 (ref 1.005–1.030)
Urobilinogen, Ur: 0.2 mg/dL (ref 0.2–1.0)
pH, UA: 5.5 (ref 5.0–7.5)

## 2021-08-12 MED ORDER — NITROFURANTOIN MONOHYD MACRO 100 MG PO CAPS: 100.0000 mg | ORAL_CAPSULE | Freq: Two times a day (BID) | ORAL | 0 refills | Status: DC

## 2021-08-12 NOTE — Progress Notes (Signed)
Subjective:  Patient ID: Cassandra [name], female    DOB: [birth year]  Age: 65 y.o. MRN: [id number]  CC: Annual Exam   HPI Cassandra [name] presents for Annual exam. Denies depression. Tapered off of lexapro , xanax. Has weaned off of gabapentin. Occasionally taking a hydrocodone. Prescribed by pain clinic. Rare use of methocarbamol.  Saw Dr. Percival Spanish recently for work up. HE said she was okay.   Depression screen [facility] 2/9 01/14/2021 11/06/2020 03/26/2020  Decreased Interest 0 0 0  Down, Depressed, Hopeless 0 0 0  PHQ - 2 Score 0 0 0  Altered sleeping 0 - -  Tired, decreased energy - - -  Change in appetite - - -  Feeling bad or failure about yourself  - - -  Trouble concentrating - - -  Moving slowly or fidgety/restless - - -  Suicidal thoughts - - -  PHQ-9 Score 0 - -  Some recent data might be hidden    History Cassandra [name] has a past medical history of Anxiety, Bowel habit changes, Chronic left shoulder pain, Chronic pain, COPD (chronic obstructive pulmonary disease) (HCC), Depression, Emphysema lung (HCC), GERD (gastroesophageal reflux disease), Hyperlipidemia, IBS (irritable bowel syndrome), Maternal complication related to childbirth, Meniere's disease, Nicotine addiction, Skin cancer, Tubular adenoma of colon (01/31/2000), and Weakness of pelvic floor.   She has a past surgical history that includes Bladder repair; Shoulder arthroscopy; Abdominal hysterectomy; Knee arthroscopy (Left); Cesarean section; ruptured uterus; and Anal Rectal manometry (N/A, 12/09/2015).   Her family history includes COPD in her brother; Colon cancer in an other family member; Colon polyps in her sister; Coronary artery disease in her brother, father, and mother; Diabetes in her mother; Early death in her son; Heart disease in her sister; Kidney disease in her mother; Liver cancer in her maternal grandmother; Liver disease in her maternal grandmother; Lung cancer in her paternal grandmother; Lung disease in her father;  Polymyalgia rheumatica in her sister; Prostate cancer in her father.She reports that she quit smoking about 6 years ago. Her smoking use included cigarettes. She has a 42.00 pack-year smoking history. She has never used smokeless tobacco. She reports that she does not drink alcohol and does not use drugs.    ROS Review of Systems  Constitutional:  Negative for appetite change, chills, diaphoresis, fatigue, fever and unexpected weight change.  HENT:  Negative for congestion, ear pain, hearing loss, postnasal drip, rhinorrhea, sneezing, sore throat and trouble swallowing.   Eyes:  Negative for pain.  Respiratory:  Negative for cough, chest tightness and shortness of breath.   Cardiovascular:  Negative for chest pain and palpitations.  Gastrointestinal:  Negative for abdominal pain, constipation, diarrhea, nausea and vomiting.  Endocrine: Negative for cold intolerance, heat intolerance, polydipsia, polyphagia and polyuria.  Genitourinary:  Negative for dysuria, frequency and menstrual problem.  Musculoskeletal:  Negative for arthralgias and joint swelling.  Skin:  Negative for rash.  Allergic/Immunologic: Negative for environmental allergies.  Neurological:  Negative for dizziness, weakness, numbness and headaches.  Psychiatric/Behavioral:  Negative for agitation and dysphoric mood.    Objective:  BP 112/65   Pulse 80   Temp 97.6 F (36.4 C)   Ht _0  (1.676 m)   Wt 156 lb 12.8 oz (71.1 kg)   SpO2 95%   BMI 25.31 kg/m   BP Readings from Last 3 Encounters:  08/12/21 112/65  01/14/21 121/74  11/06/20 130/74    Wt Readings from Last 3 Encounters:  08/12/21 156 lb 12.8 oz (71.1  kg)  01/14/21 157 lb (71.2 kg)  11/06/20 155 lb 6.4 oz (70.5 kg)     Physical Exam Constitutional:      General: She is not in acute distress.    Appearance: Normal appearance. She is well-developed.  HENT:     Head: Normocephalic and atraumatic.     Right Ear: External ear normal.     Left Ear:  External ear normal.     Nose: Nose normal.  Eyes:     Conjunctiva/sclera: Conjunctivae normal.     Pupils: Pupils are equal, round, and reactive to light.  Neck:     Thyroid: No thyromegaly.  Cardiovascular:     Rate and Rhythm: Normal rate and regular rhythm.     Heart sounds: Normal heart sounds. No murmur heard. Pulmonary:     Effort: Pulmonary effort is normal. No respiratory distress.     Breath sounds: Normal breath sounds. No wheezing or rales.  Chest:  Breasts:    Breasts are symmetrical.     Right: No inverted nipple, mass or tenderness.     Left: No inverted nipple, mass or tenderness.  Abdominal:     General: Bowel sounds are normal. There is no distension or abdominal bruit.     Palpations: Abdomen is soft. There is no hepatomegaly, splenomegaly or mass.     Tenderness: There is no abdominal tenderness. Negative signs include Murphy's sign and McBurney's sign.  Musculoskeletal:        General: No tenderness. Normal range of motion.     Cervical back: Normal range of motion and neck supple.  Lymphadenopathy:     Cervical: No cervical adenopathy.  Skin:    General: Skin is warm and dry.     Findings: No rash.  Neurological:     Mental Status: She is alert and oriented to person, place, and time.     Deep Tendon Reflexes: Reflexes are normal and symmetric.  Psychiatric:        Behavior: Behavior normal.        Thought Content: Thought content normal.        Judgment: Judgment normal.      Assessment & Plan:   Cassandra [name] was seen today for annual exam.  Diagnoses and all orders for this visit:  Well adult exam -     CBC with Differential/Platelet -     CMP14+EGFR -     Lipid panel -     Urinalysis -     VITAMIN D 25 Hydroxy (Vit-D Deficiency, Fractures)  Aortic atherosclerosis (HCC)  Dyslipidemia -     Lipid panel  Pulmonary emphysema, unspecified emphysema type ([place]) -     CBC with Differential/Platelet  Insomnia, unspecified type -      CMP14+EGFR  Meniere's disease, unspecified laterality      I have discontinued [name]. Gallick "[name]"'s hydroxypropyl methylcellulose / hypromellose, ondansetron, mirabegron ER, nitrofurantoin, methylPREDNISolone, gabapentin, and brompheniramine-pseudoephedrine-DM. I am also having her maintain her conjugated estrogens, cholecalciferol, fexofenadine, meclizine, methocarbamol, HYDROcodone-acetaminophen, and albuterol.  Allergies as of 08/12/2021       Reactions   Chantix [varenicline] Rash   Ciprofloxacin    Codeine Itching, Nausea And Vomiting   Cymbalta [duloxetine Hcl] Nausea Only   Livalo [pitavastatin] Other (See Comments)   Seizures    Metoprolol Other (See Comments)   Had episode of possible hypotension or seizure described and muscles clamping down and searing pain toe to head and near fall x2. Loss of bowel control. No further  symptoms after stopped.   Morphine And Related Hives   Itching hive rash   Oxycodone-aspirin Nausea And Vomiting   Oxycontin [oxycodone Hcl] Other (See Comments)   Altered mental status   Propoxyphene N-acetaminophen Nausea And Vomiting   Remeron [mirtazapine]    Shakes/tremors   Trazodone And Nefazodone Other (See Comments)   Medication is too strong   Zithromax [azithromycin] Other (See Comments)   Unknown    Other Rash   Lipturzet caused enlarged veins in legs   Oxycodone-acetaminophen Hives, Nausea And Vomiting, Rash   Septra [sulfamethoxazole-trimethoprim] Rash        Medication List        Accurate as of August 12, 2021  2:44 PM. If you have any questions, ask your nurse or doctor.          STOP taking these medications    brompheniramine-pseudoephedrine-DM 30-2-10 MG/5ML syrup Stopped by: [name], MD   gabapentin 100 MG capsule Commonly known as: NEURONTIN Stopped by: [name], MD   hydroxypropyl methylcellulose / hypromellose 2.5 % ophthalmic solution Commonly known as: ISOPTO TEARS / GONIOVISC Stopped by:  [name], MD   methylPREDNISolone 4 MG Tbpk tablet Commonly known as: MEDROL DOSEPAK Stopped by: [name], MD   mirabegron ER 50 MG Tb24 tablet Commonly known as: Myrbetriq Stopped by: [name], MD   nitrofurantoin 50 MG capsule Commonly known as: MACRODANTIN Stopped by: [name], MD   ondansetron 8 MG disintegrating tablet Commonly known as: ZOFRAN-ODT Stopped by: [name], MD       TAKE these medications    albuterol 108 (90 Base) MCG/ACT inhaler Commonly known as: VENTOLIN HFA Inhale 2 puffs into the lungs every 6 (six) hours as needed for wheezing or shortness of breath.   cholecalciferol 1000 units tablet Commonly known as: VITAMIN D Take 1,000 Units by mouth daily.   conjugated estrogens 0.625 MG/GM vaginal cream Commonly known as: PREMARIN Place vaginally daily. What changed:  how much to take when to take this reasons to take this   fexofenadine 180 MG tablet Commonly known as: ALLEGRA Take 1 tablet (180 mg total) by mouth daily as needed for allergies.   HYDROcodone-acetaminophen 5-325 MG tablet Commonly known as: NORCO/VICODIN Take 1 tablet by mouth daily as needed for moderate pain.   meclizine 25 MG tablet Commonly known as: ANTIVERT TAKE 1 TABLET BY MOUTH TWO  TIMES DAILY AS NEEDED What changed:  how much to take how to take this when to take this reasons to take this additional instructions   methocarbamol 500 MG tablet Commonly known as: ROBAXIN Take 1 tablet (500 mg total) by mouth 2 (two) times daily.         Follow-up: Return in about 1 year (around 08/12/2022).  [name], M.D.

## 2021-08-13 LAB — CMP14+EGFR
ALT: 10 IU/L (ref 0–32)
AST: 20 IU/L (ref 0–40)
Albumin/Globulin Ratio: 1.6 (ref 1.2–2.2)
Albumin: 4.4 g/dL (ref 3.8–4.8)
Alkaline Phosphatase: 136 IU/L — ABNORMAL HIGH (ref 44–121)
BUN/Creatinine Ratio: 10 — ABNORMAL LOW (ref 12–28)
BUN: 11 mg/dL (ref 8–27)
Bilirubin Total: <0.2 mg/dL (ref 0.0–1.2)
CO2: 26 mmol/L (ref 20–29)
Calcium: 9.5 mg/dL (ref 8.7–10.3)
Chloride: 103 mmol/L (ref 96–106)
Creatinine, Ser: 1.06 mg/dL — ABNORMAL HIGH (ref 0.57–1.00)
Globulin, Total: 2.7 g/dL (ref 1.5–4.5)
Glucose: 62 mg/dL — ABNORMAL LOW (ref 70–99)
Potassium: 4.5 mmol/L (ref 3.5–5.2)
Sodium: 143 mmol/L (ref 134–144)
Total Protein: 7.1 g/dL (ref 6.0–8.5)
eGFR: 58 mL/min/{1.73_m2} — ABNORMAL LOW (ref >59)

## 2021-08-13 LAB — LIPID PANEL
Chol/HDL Ratio: 5 ratio — ABNORMAL HIGH (ref 0.0–4.4)
Cholesterol, Total: 261 mg/dL — ABNORMAL HIGH (ref 100–199)
HDL: 52 mg/dL (ref >39)
LDL Chol Calc (NIH): 186 mg/dL — ABNORMAL HIGH (ref 0–99)
Triglycerides: 129 mg/dL (ref 0–149)
VLDL Cholesterol Cal: 23 mg/dL (ref 5–40)

## 2021-08-13 LAB — CBC WITH DIFFERENTIAL/PLATELET
Basophils Absolute: 0.1 10*3/uL (ref 0.0–0.2)
Basos: 1 %
EOS (ABSOLUTE): 0.1 10*3/uL (ref 0.0–0.4)
Eos: 2 %
Hematocrit: 43.6 % (ref 34.0–46.6)
Hemoglobin: 15 g/dL (ref 11.1–15.9)
Immature Grans (Abs): 0 10*3/uL (ref 0.0–0.1)
Immature Granulocytes: 0 %
Lymphocytes Absolute: 2.2 10*3/uL (ref 0.7–3.1)
Lymphs: 31 %
MCH: 29.7 pg (ref 26.6–33.0)
MCHC: 34.4 g/dL (ref 31.5–35.7)
MCV: 86 fL (ref 79–97)
Monocytes Absolute: 0.6 10*3/uL (ref 0.1–0.9)
Monocytes: 9 %
Neutrophils Absolute: 3.9 10*3/uL (ref 1.4–7.0)
Neutrophils: 57 %
Platelets: 354 10*3/uL (ref 150–450)
RBC: 5.05 x10E6/uL (ref 3.77–5.28)
RDW: 14.1 % (ref 11.7–15.4)
WBC: 6.9 10*3/uL (ref 3.4–10.8)

## 2021-08-13 LAB — VITAMIN D 25 HYDROXY (VIT D DEFICIENCY, FRACTURES): Vit D, 25-Hydroxy: 32.2 ng/mL (ref 30.0–100.0)

## 2021-11-23 ENCOUNTER — Encounter: Payer: Self-pay | Admitting: Family Medicine

## 2022-04-05 DIAGNOSIS — M7581 Other shoulder lesions, right shoulder: Secondary | ICD-10-CM | POA: Diagnosis not present

## 2022-04-05 DIAGNOSIS — M5412 Radiculopathy, cervical region: Secondary | ICD-10-CM | POA: Diagnosis not present

## 2022-08-15 ENCOUNTER — Encounter: Payer: Medicare Other | Admitting: Family Medicine

## 2022-08-15 ENCOUNTER — Encounter: Payer: Self-pay | Admitting: Family Medicine

## 2022-08-15 ENCOUNTER — Encounter: Payer: Self-pay | Admitting: *Deleted

## 2022-08-15 ENCOUNTER — Ambulatory Visit (INDEPENDENT_AMBULATORY_CARE_PROVIDER_SITE_OTHER): Payer: Medicare Other | Admitting: Family Medicine

## 2022-08-15 DIAGNOSIS — R1314 Dysphagia, pharyngoesophageal phase: Secondary | ICD-10-CM | POA: Diagnosis not present

## 2022-08-15 DIAGNOSIS — L301 Dyshidrosis [pompholyx]: Secondary | ICD-10-CM

## 2022-08-15 DIAGNOSIS — Z0001 Encounter for general adult medical examination with abnormal findings: Secondary | ICD-10-CM

## 2022-08-15 DIAGNOSIS — Z0000 Encounter for general adult medical examination without abnormal findings: Secondary | ICD-10-CM

## 2022-08-15 MED ORDER — FLUOCINONIDE 0.05 % EX CREA: 1.0000 | TOPICAL_CREAM | Freq: Two times a day (BID) | CUTANEOUS | 5 refills | Status: AC

## 2022-08-15 NOTE — Progress Notes (Signed)
Subjective:  Patient ID: Cassandra [name], female    DOB: 1956-07-10  Age: 66 y.o. MRN: [id number]  CC: Annual Exam   HPI Cassandra [name] presents for annual exam. Has shoulder pain. Off pain meds.      08/15/2022    9:30 AM 01/14/2021   10:38 AM 11/06/2020    1:04 PM  Depression screen PHQ 2/9  Decreased Interest 1 0 0  Down, Depressed, Hopeless 0 0 0  PHQ - 2 Score 1 0 0  Altered sleeping 2 0   Tired, decreased energy 1    Change in appetite 0    Feeling bad or failure about yourself  0    Trouble concentrating 0    Moving slowly or fidgety/restless 0    Suicidal thoughts 0    PHQ-9 Score 4 0   Difficult doing work/chores Not difficult at all      History Cassandra [name] has a past medical history of Anxiety, Bowel habit changes, Chronic left shoulder pain, Chronic pain, COPD (chronic obstructive pulmonary disease) ([place]), Depression, Emphysema lung ([place]), GERD (gastroesophageal reflux disease), Hyperlipidemia, IBS (irritable bowel syndrome), Maternal complication related to childbirth, Meniere's disease, Nicotine addiction, Skin cancer, Tubular adenoma of colon (01/31/2000), and Weakness of pelvic floor.   Cassandra [name] has a past surgical history that includes Bladder repair; Shoulder arthroscopy; Abdominal hysterectomy; Knee arthroscopy (Left); Cesarean section; ruptured uterus; and Anal Rectal manometry (N/A, 12/09/2015).   Cassandra [name] family history includes COPD in Cassandra [name] brother; Colon cancer in an other family member; Colon polyps in Cassandra [name] sister; Coronary artery disease in Cassandra [name] brother, father, and mother; Diabetes in Cassandra [name] mother; Early death in Cassandra [name] son; Heart disease in Cassandra [name] sister; Kidney disease in Cassandra [name] mother; Liver cancer in Cassandra [name] maternal grandmother; Liver disease in Cassandra [name] maternal grandmother; Lung cancer in Cassandra [name] paternal grandmother; Lung disease in Cassandra [name] father; Polymyalgia rheumatica in Cassandra [name] sister; Prostate cancer in Cassandra [name] father.Cassandra [name] reports that Cassandra [name] quit smoking about 7 years ago. Cassandra [name] smoking use included  cigarettes. Cassandra [name] has a 42.00 pack-year smoking history. Cassandra [name] has never used smokeless tobacco. Cassandra [name] reports that Cassandra [name] does not drink alcohol and does not use drugs.    ROS Review of Systems  Constitutional:  Negative for appetite change, chills, diaphoresis, fatigue, fever and unexpected weight change.  HENT:  Positive for trouble swallowing (feels like Cassandra [name] will faint, has palpitations with swallowing. Feels faint for a minute. Onset 1 weeka go.). Negative for congestion, ear pain, hearing loss, postnasal drip, rhinorrhea, sneezing and sore throat.   Eyes:  Negative for pain.  Respiratory:  Negative for cough, chest tightness and shortness of breath.   Cardiovascular:  Negative for chest pain and palpitations.  Gastrointestinal:  Positive for constipation. Negative for abdominal pain, diarrhea, nausea and vomiting.  Endocrine: Negative for cold intolerance, heat intolerance, polydipsia, polyphagia and polyuria.  Genitourinary:  Negative for dysuria, frequency and menstrual problem.  Musculoskeletal:  Negative for joint swelling. Arthralgias: saw ortho for shoulder overuse. Had joint injection and takes meloxicam.. Skin:  Negative for rash.  Allergic/Immunologic: Negative for environmental allergies.  Neurological:  Negative for dizziness, weakness, numbness and headaches.  Psychiatric/Behavioral:  Negative for agitation and dysphoric mood.     Objective:  There were no vitals taken for this visit.  BP Readings from Last 3 Encounters:  08/12/21 112/65  01/14/21 121/74  11/06/20 130/74    Wt Readings from Last 3 Encounters:  08/12/21 156 lb 12.8 oz (71.1 kg)  01/14/21 157 lb (71.2 kg)  11/06/20 155  lb 6.4 oz (70.5 kg)     Physical Exam Constitutional:      General: Cassandra [name] is not in acute distress.    Appearance: Normal appearance. Cassandra [name] is well-developed.  HENT:     Head: Normocephalic and atraumatic.     Right Ear: External ear normal.     Left Ear: External ear normal.     Nose:  Nose normal.  Eyes:     Conjunctiva/sclera: Conjunctivae normal.     Pupils: Pupils are equal, round, and reactive to light.  Neck:     Thyroid: No thyromegaly.  Cardiovascular:     Rate and Rhythm: Normal rate and regular rhythm.     Heart sounds: Normal heart sounds. No murmur heard. Pulmonary:     Effort: Pulmonary effort is normal. No respiratory distress.     Breath sounds: Normal breath sounds. No wheezing or rales.  Chest:  Breasts:    Breasts are symmetrical.     Right: No inverted nipple, mass or tenderness.     Left: No inverted nipple, mass or tenderness.  Abdominal:     General: Bowel sounds are normal. There is no distension or abdominal bruit.     Palpations: Abdomen is soft. There is no hepatomegaly, splenomegaly or mass.     Tenderness: There is no abdominal tenderness. Negative signs include Murphy's sign and McBurney's sign.  Musculoskeletal:        General: No tenderness. Normal range of motion.     Cervical back: Normal range of motion and neck supple.  Lymphadenopathy:     Cervical: No cervical adenopathy.  Skin:    General: Skin is warm and dry.     Findings: No rash.  Neurological:     Mental Status: Cassandra [name] is alert and oriented to person, place, and time.     Deep Tendon Reflexes: Reflexes are normal and symmetric.  Psychiatric:        Behavior: Behavior normal.        Thought Content: Thought content normal.        Judgment: Judgment normal.       Assessment & Plan:   [name] was seen today for annual exam.  Diagnoses and all orders for this visit:  Pharyngoesophageal dysphagia  Well adult exam -     CBC with Differential/Platelet -     CMP14+EGFR -     Lipid panel -     VITAMIN D 25 Hydroxy (Vit-D Deficiency, Fractures) -     Urinalysis  Dyshidrotic eczema  Other orders -     fluocinonide cream (LIDEX) 0.05 %; Apply 1 Application topically 2 (two) times daily.       I have discontinued Hayzel Ruberg. Ells "[name]"'s conjugated estrogens,  cholecalciferol, fexofenadine, meclizine, methocarbamol, HYDROcodone-acetaminophen, albuterol, and nitrofurantoin (macrocrystal-monohydrate). I am also having Cassandra [name] start on fluocinonide cream. Additionally, I am having Cassandra [name] maintain Cassandra [name] meloxicam.  Allergies as of 08/15/2022       Reactions   Chantix [varenicline] Rash   Ciprofloxacin    Codeine Itching, Nausea And Vomiting   Cymbalta [duloxetine Hcl] Nausea Only   Livalo [pitavastatin] Other (See Comments)   Seizures    Metoprolol Other (See Comments)   Had episode of possible hypotension or seizure described and muscles clamping down and searing pain toe to head and near fall x2. Loss of bowel control. No further symptoms after stopped.   Morphine And Related Hives   Itching hive rash   Oxycodone-aspirin Nausea And Vomiting   Oxycontin [oxycodone  Hcl] Other (See Comments)   Altered mental status   Propoxyphene N-acetaminophen Nausea And Vomiting   Remeron [mirtazapine]    Shakes/tremors   Trazodone And Nefazodone Other (See Comments)   Medication is too strong   Zithromax [azithromycin] Other (See Comments)   Unknown    Other Rash   Lipturzet caused enlarged veins in legs   Oxycodone-acetaminophen Hives, Nausea And Vomiting, Rash   Septra [sulfamethoxazole-trimethoprim] Rash        Medication List        Accurate as of August 15, 2022  8:51 PM. If you have any questions, ask your nurse or doctor.          STOP taking these medications    albuterol 108 (90 Base) MCG/ACT inhaler Commonly known as: VENTOLIN HFA Stopped by: [name], MD   cholecalciferol 1000 units tablet Commonly known as: VITAMIN D Stopped by: [name], MD   conjugated estrogens 0.625 MG/GM vaginal cream Commonly known as: PREMARIN Stopped by: [name], MD   fexofenadine 180 MG tablet Commonly known as: ALLEGRA Stopped by: [name], MD   HYDROcodone-acetaminophen 5-325 MG tablet Commonly known as: NORCO/VICODIN Stopped  by: [name], MD   meclizine 25 MG tablet Commonly known as: ANTIVERT Stopped by: [name], MD   methocarbamol 500 MG tablet Commonly known as: ROBAXIN Stopped by: [name], MD   nitrofurantoin (macrocrystal-monohydrate) 100 MG capsule Commonly known as: Macrobid Stopped by: [name], MD       TAKE these medications    fluocinonide cream 0.05 % Commonly known as: LIDEX Apply 1 Application topically 2 (two) times daily. Started by: [name], MD   meloxicam 15 MG tablet Commonly known as: MOBIC Take 15 mg by mouth daily.        Moisturize BID with Aquaphor. Apply cream BID, but not at the same time, to the scaly areas Follow-up: Return in about 6 months (around 02/14/2023).  [name], M.D.

## 2022-10-16 NOTE — Patient Instructions (Signed)
Ms. Lanting , Thank you for taking time to come for your Medicare Wellness Visit. I appreciate your ongoing commitment to your health goals. Please review the following plan we discussed and let me know if I can assist you in the future.   These are the goals we discussed:  Goals      AWV     05/17/2019 AWV Goal: Fall Prevention  Over the next year, patient will decrease their risk for falls by: Using assistive devices, such as a cane or walker, as needed Identifying fall risks within their home and correcting them by: Removing throw rugs Adding handrails to stairs or ramps Removing clutter and keeping a clear pathway throughout the home Increasing light, especially at night Adding shower handles/bars Raising toilet seat Identifying potential personal risk factors for falls: Medication side effects Incontinence/urgency Vestibular dysfunction Hearing loss Musculoskeletal disorders Neurological disorders Orthostatic hypotension       DIET - EAT MORE FRUITS AND VEGETABLES     Exercise 150 min/wk Moderate Activity        This is a list of the screening recommended for you and due dates:  Health Maintenance  Topic Date Due   Screening for Lung Cancer  05/24/2017   COVID-19 Vaccine (3 - 2023-24 season) 05/13/2022   Zoster (Shingles) Vaccine (1 of 2) 11/14/2022*   Flu Shot  12/11/2022*   Pneumonia Vaccine (1 - PCV) 08/16/2023*   Mammogram  07/20/2023   Medicare Annual Wellness Visit  10/18/2023   Colon Cancer Screening  08/04/2025   DEXA scan (bone density measurement)  Completed   Hepatitis C Screening: USPSTF Recommendation to screen - Ages 67-79 yo.  Completed   HPV Vaccine  Aged Out   DTaP/Tdap/Td vaccine  Discontinued  *Topic was postponed. The date shown is not the original due date.    Advanced directives: Forms are available if you choose in the future to pursue completion.  This is recommended in order to make sure that your health wishes are honored in the event that  you are unable to verbalize them to the provider.    Conditions/risks identified: Aim for 30 minutes of exercise or brisk walking, 6-8 glasses of water, and 5 servings of fruits and vegetables each day.   Next appointment: Follow up in one year for your annual wellness visit    Preventive Care 65 Years and Older, Female Preventive care refers to lifestyle choices and visits with your health care provider that can promote health and wellness. What does preventive care include? A yearly physical exam. This is also called an annual well check. Dental exams once or twice a year. Routine eye exams. Ask your health care provider how often you should have your eyes checked. Personal lifestyle choices, including: Daily care of your teeth and gums. Regular physical activity. Eating a healthy diet. Avoiding tobacco and drug use. Limiting alcohol use. Practicing safe sex. Taking low-dose aspirin every day. Taking vitamin and mineral supplements as recommended by your health care provider. What happens during an annual well check? The services and screenings done by your health care provider during your annual well check will depend on your age, overall health, lifestyle risk factors, and family history of disease. Counseling  Your health care provider may ask you questions about your: Alcohol use. Tobacco use. Drug use. Emotional well-being. Home and relationship well-being. Sexual activity. Eating habits. History of falls. Memory and ability to understand (cognition). Work and work Statistician. Reproductive health. Screening  You may have the  following tests or measurements: Height, weight, and BMI. Blood pressure. Lipid and cholesterol levels. These may be checked every 5 years, or more frequently if you are over 48 years old. Skin check. Lung cancer screening. You may have this screening every year starting at age 62 if you have a 30-pack-year history of smoking and currently smoke  or have quit within the past 15 years. Fecal occult blood test (FOBT) of the stool. You may have this test every year starting at age 56. Flexible sigmoidoscopy or colonoscopy. You may have a sigmoidoscopy every 5 years or a colonoscopy every 10 years starting at age 61. Hepatitis C blood test. Hepatitis B blood test. Sexually transmitted disease (STD) testing. Diabetes screening. This is done by checking your blood sugar (glucose) after you have not eaten for a while (fasting). You may have this done every 1-3 years. Bone density scan. This is done to screen for osteoporosis. You may have this done starting at age 23. Mammogram. This may be done every 1-2 years. Talk to your health care provider about how often you should have regular mammograms. Talk with your health care provider about your test results, treatment options, and if necessary, the need for more tests. Vaccines  Your health care provider may recommend certain vaccines, such as: Influenza vaccine. This is recommended every year. Tetanus, diphtheria, and acellular pertussis (Tdap, Td) vaccine. You may need a Td booster every 10 years. Zoster vaccine. You may need this after age 60. Pneumococcal 13-valent conjugate (PCV13) vaccine. One dose is recommended after age 40. Pneumococcal polysaccharide (PPSV23) vaccine. One dose is recommended after age 24. Talk to your health care provider about which screenings and vaccines you need and how often you need them. This information is not intended to replace advice given to you by your health care provider. Make sure you discuss any questions you have with your health care provider. Document Released: 09/25/2015 Document Revised: 05/18/2016 Document Reviewed: 06/30/2015 Elsevier Interactive Patient Education  2017 [place] Prevention in the Home Falls can cause injuries. They can happen to people of all ages. There are many things you can do to make your home safe and to help  prevent falls. What can I do on the outside of my home? Regularly fix the edges of walkways and driveways and fix any cracks. Remove anything that might make you trip as you walk through a door, such as a raised step or threshold. Trim any bushes or trees on the path to your home. Use bright outdoor lighting. Clear any walking paths of anything that might make someone trip, such as rocks or tools. Regularly check to see if handrails are loose or broken. Make sure that both sides of any steps have handrails. Any raised decks and porches should have guardrails on the edges. Have any leaves, snow, or ice cleared regularly. Use sand or salt on walking paths during winter. Clean up any spills in your garage right away. This includes oil or grease spills. What can I do in the bathroom? Use night lights. Install grab bars by the toilet and in the tub and shower. Do not use towel bars as grab bars. Use non-skid mats or decals in the tub or shower. If you need to sit down in the shower, use a plastic, non-slip stool. Keep the floor dry. Clean up any water that spills on the floor as soon as it happens. Remove soap buildup in the tub or shower regularly. Attach bath mats securely  with double-sided non-slip rug tape. Do not have throw rugs and other things on the floor that can make you trip. What can I do in the bedroom? Use night lights. Make sure that you have a light by your bed that is easy to reach. Do not use any sheets or blankets that are too big for your bed. They should not hang down onto the floor. Have a firm chair that has side arms. You can use this for support while you get dressed. Do not have throw rugs and other things on the floor that can make you trip. What can I do in the kitchen? Clean up any spills right away. Avoid walking on wet floors. Keep items that you use a lot in easy-to-reach places. If you need to reach something above you, use a strong step stool that has a  grab bar. Keep electrical cords out of the way. Do not use floor polish or wax that makes floors slippery. If you must use wax, use non-skid floor wax. Do not have throw rugs and other things on the floor that can make you trip. What can I do with my stairs? Do not leave any items on the stairs. Make sure that there are handrails on both sides of the stairs and use them. Fix handrails that are broken or loose. Make sure that handrails are as long as the stairways. Check any carpeting to make sure that it is firmly attached to the stairs. Fix any carpet that is loose or worn. Avoid having throw rugs at the top or bottom of the stairs. If you do have throw rugs, attach them to the floor with carpet tape. Make sure that you have a light switch at the top of the stairs and the bottom of the stairs. If you do not have them, ask someone to add them for you. What else can I do to help prevent falls? Wear shoes that: Do not have high heels. Have rubber bottoms. Are comfortable and fit you well. Are closed at the toe. Do not wear sandals. If you use a stepladder: Make sure that it is fully opened. Do not climb a closed stepladder. Make sure that both sides of the stepladder are locked into place. Ask someone to hold it for you, if possible. Clearly mark and make sure that you can see: Any grab bars or handrails. First and last steps. Where the edge of each step is. Use tools that help you move around (mobility aids) if they are needed. These include: Canes. Walkers. Scooters. Crutches. Turn on the lights when you go into a dark area. Replace any light bulbs as soon as they burn out. Set up your furniture so you have a clear path. Avoid moving your furniture around. If any of your floors are uneven, fix them. If there are any pets around you, be aware of where they are. Review your medicines with your doctor. Some medicines can make you feel dizzy. This can increase your chance of  falling. Ask your doctor what other things that you can do to help prevent falls. This information is not intended to replace advice given to you by your health care provider. Make sure you discuss any questions you have with your health care provider. Document Released: 06/25/2009 Document Revised: 02/04/2016 Document Reviewed: 10/03/2014 Elsevier Interactive Patient Education  2017 [employer].

## 2022-10-16 NOTE — Progress Notes (Unsigned)
Subjective:   Cassandra [name] is a 67 y.o. female who presents for Medicare Annual (Subsequent) preventive examination.  Review of Systems    ***       Objective:    There were no vitals filed for this visit. There is no height or weight on file to calculate BMI.     05/21/2021    8:17 AM 04/08/2020   11:03 AM 05/17/2019    9:19 AM 04/03/2019   12:56 PM 12/20/2017   11:16 AM 06/12/2017   11:03 AM 05/16/2017   10:09 AM  Advanced Directives  Does Patient Have a Medical Advance Directive? No No No No No No No  Would patient like information on creating a medical advance directive? No - Patient declined  Yes (MAU/Ambulatory/Procedural Areas - Information given) No - Patient declined No - Patient declined      Current Medications (verified) Outpatient Encounter Medications as of 10/17/2022  Medication Sig   fluocinonide cream (LIDEX) 5.85 % Apply 1 Application topically 2 (two) times daily.   meloxicam (MOBIC) 15 MG tablet Take 15 mg by mouth daily.   No facility-administered encounter medications on file as of 10/17/2022.    Allergies (verified) Chantix [varenicline], Ciprofloxacin, Codeine, Cymbalta [duloxetine hcl], Livalo [pitavastatin], Metoprolol, Morphine and related, Oxycodone-aspirin, Oxycontin [oxycodone hcl], Propoxyphene n-acetaminophen, Remeron [mirtazapine], Trazodone and nefazodone, Zithromax [azithromycin], Other, Oxycodone-acetaminophen, and Septra [sulfamethoxazole-trimethoprim]   History: Past Medical History:  Diagnosis Date   Anxiety    Bowel habit changes    excessive gas /frequent BM's with change in shape with pus   Chronic left shoulder pain    frozen    Chronic pain    left upper shoulder ,neck and back   COPD (chronic obstructive pulmonary disease) ([place])    Dr. Redge Gainer   Depression    Emphysema lung ([place])    GERD (gastroesophageal reflux disease)    Hyperlipidemia    IBS (irritable bowel syndrome)    Maternal complication related to childbirth     Sepsis, staph   Meniere's disease    Nicotine addiction    2 packs per day    Skin cancer    Rt. leg, squamous cell   Tubular adenoma of colon 01/31/2000   Dr. Dellis Filbert Medoff   Weakness of pelvic floor    prolapse -  MRI from 2017   Past Surgical History:  Procedure Laterality Date   ABDOMINAL HYSTERECTOMY     age 49   ANAL RECTAL MANOMETRY N/A 12/09/2015   Procedure: ANO RECTAL MANOMETRY;  Surgeon: Mauri Pole, MD;  Location: [facility];  Service: Endoscopy;  Laterality: N/A;   BLADDER REPAIR     sling/with perforation   CESAREAN SECTION     1 time   KNEE ARTHROSCOPY Left    ruptured uterus     SHOULDER ARTHROSCOPY     multiple   Family History  Problem Relation Age of Onset   Coronary artery disease Mother    Diabetes Mother        borderline   Kidney disease Mother        stage 4   Coronary artery disease Father    Prostate cancer Father    Lung disease Father        black lung   Liver cancer Maternal Grandmother    Liver disease Maternal Grandmother    Polymyalgia rheumatica Sister    Heart disease Sister    Coronary artery disease Brother  heart attack   COPD Brother    Colon polyps Sister    Lung cancer Paternal Grandmother    Colon cancer Other        Both sides   Early death Son    Social History   Socioeconomic History   Marital status: Divorced    Spouse name: Not on file   Number of children: 1   Years of education: Not on file   Highest education level: GED or equivalent  Occupational History   Occupation: Homemaker  Tobacco Use   Smoking status: Former    Packs/day: 1.00    Years: 42.00    Total pack years: 42.00    Types: Cigarettes    Quit date: 06/01/2015    Years since quitting: 7.3   Smokeless tobacco: Never  Vaping Use   Vaping Use: Never used  Substance and Sexual Activity   Alcohol use: No    Alcohol/week: 0.0 standard drinks of alcohol   Drug use: No   Sexual activity: Yes  Other Topics Concern   Not on  file  Social History Narrative   Lives alone.  One child.  Two grands.     Right handed   Social Determinants of Health   Financial Resource Strain: Low Risk  (12/20/2017)   Overall Financial Resource Strain (CARDIA)    Difficulty of Paying Living Expenses: Not hard at all  Food Insecurity: No Food Insecurity (12/20/2017)   Hunger Vital Sign    Worried About Running Out of Food in the Last Year: Never true    Ran Out of Food in the Last Year: Never true  Transportation Needs: No Transportation Needs (12/20/2017)   PRAPARE - Hydrologist (Medical): No    Lack of Transportation (Non-Medical): No  Physical Activity: Insufficiently Active (12/20/2017)   Exercise Vital Sign    Days of Exercise per Week: 3 days    Minutes of Exercise per Session: 30 min  Stress: No Stress Concern Present (12/20/2017)   [place]    Feeling of Stress : Not at all  Social Connections: Moderately Integrated (12/20/2017)   Social Connection and Isolation Panel [NHANES]    Frequency of Communication with Friends and Family: More than three times a week    Frequency of Social Gatherings with Friends and Family: More than three times a week    Attends Religious Services: 1 to 4 times per year    Active Member of [employer] or Organizations: No    Attends Archivist Meetings: Never    Marital Status: Married    Tobacco Counseling Counseling given: Not Answered   Clinical Intake:                 Diabetic?No          Activities of Daily Living     No data to display          Patient Care Team: [name], MD as PCP - General (Family Medicine) Minus Breeding, MD as PCP - Cardiology (Cardiology) Meredith Staggers, MD as Consulting Physician (Physical Medicine and Rehabilitation)  Indicate any recent Medical Services you may have received from other than [facility] providers in the past  year (date may be approximate).     Assessment:   This is a routine wellness examination for Cassandra [name].  Hearing/Vision screen No results found.  Dietary issues and exercise activities discussed:     Goals Addressed  None    Depression Screen    08/15/2022    9:30 AM 01/14/2021   10:38 AM 11/06/2020    1:04 PM 03/26/2020   10:19 AM 02/06/2020    2:28 PM 05/17/2019    9:20 AM 04/22/2019    2:32 PM  PHQ 2/9 Scores  PHQ - 2 Score 1 0 0 0 0 0 0  PHQ- 9 Score 4 0         Fall Risk    08/15/2022    9:23 AM 05/21/2021    8:16 AM 01/14/2021   10:37 AM 11/06/2020    1:04 PM 08/27/2020   12:59 PM  Fall Risk   Falls in the past year? 0 0 1 0 1  Number falls in past yr:   0 0 0  Injury with Fall?   1 0 0    FALL RISK PREVENTION PERTAINING TO THE HOME:  Any stairs in or around the home? {YES/NO:21197} If so, are there any without handrails? {YES/NO:21197} Home free of loose throw rugs in walkways, pet beds, electrical cords, etc? {YES/NO:21197} Adequate lighting in your home to reduce risk of falls? {YES/NO:21197}  ASSISTIVE DEVICES UTILIZED TO PREVENT FALLS:  Life alert? {YES/NO:21197} Use of a cane, walker or w/c? {YES/NO:21197} Grab bars in the bathroom? {YES/NO:21197} Shower chair or bench in shower? {YES/NO:21197} Elevated toilet seat or a handicapped toilet? {YES/NO:21197}  TIMED UP AND GO:  Was the test performed? No . Telephonic visit   Cognitive Function:      05/21/2021    8:18 AM 05/17/2019    9:26 AM  6CIT Screen  What Year? 0 points 0 points  What month? 0 points 0 points  What time? 0 points 0 points  Count back from 20 0 points 0 points  Months in reverse 0 points 0 points  Repeat phrase 0 points 0 points  Total Score 0 points 0 points    Immunizations Immunization History  Administered Date(s) Administered   Influenza Split 06/21/2010   Influenza, Seasonal, Injecte, Preservative Fre 06/25/2014   Influenza,inj,Quad PF,6+ Mos 07/09/2013, 06/09/2015,  06/27/2016, 06/20/2017, 06/20/2018   Moderna Sars-Covid-2 Vaccination 12/26/2019, 01/28/2020   Td 05/13/2008    {TDAP status:2101805}  {Flu Vaccine status:2101806}  {Pneumococcal vaccine status:2101807}  {Covid-19 vaccine status:2101808}  Qualifies for Shingles Vaccine? {YES/NO:21197}  Zostavax completed {YES/NO:21197}  {Shingrix Completed?:2101804}  Screening Tests Health Maintenance  Topic Date Due   Lung Cancer Screening  05/24/2017   COVID-19 Vaccine (3 - 2023-24 season) 05/13/2022   Medicare Annual Wellness (AWV)  05/21/2022   Zoster Vaccines- Shingrix (1 of 2) 11/14/2022 (Originally 12/07/2005)   INFLUENZA VACCINE  12/11/2022 (Originally 04/12/2022)   Pneumonia Vaccine 76+ Years old (1 - PCV) 08/16/2023 (Originally 12/07/1961)   MAMMOGRAM  07/20/2023   COLONOSCOPY (Pts 45-65yr Insurance coverage will need to be confirmed)  08/04/2025   DEXA SCAN  Completed   Hepatitis C Screening  Completed   HPV VACCINES  Aged Out   DTaP/Tdap/Td  Discontinued    Health Maintenance  Health Maintenance Due  Topic Date Due   Lung Cancer Screening  05/24/2017   COVID-19 Vaccine (3 - 2023-24 season) 05/13/2022   Medicare Annual Wellness (AWV)  05/21/2022    Colorectal cancer screening: Type of screening: Colonoscopy. Completed 08/05/15. Repeat every 10 years  Mammogram status: Completed 07/19/21. Repeat every year  Bone Density status: Completed ***. Results reflect: {Bone density results:21018022}  Lung Cancer Screening: (Low Dose CT Chest recommended if Age 67-80years, 30 pack-year  currently smoking OR have quit w/in 15years.) does qualify.   Lung Cancer Screening Referral: ***  Additional Screening:  Hepatitis C Screening: does qualify; Completed 01/18/16  Vision Screening: Recommended annual ophthalmology exams for early detection of glaucoma and other disorders of the eye. Is the patient up to date with their annual eye exam?  {YES/NO:21197} Who is the provider or what is  the name of the office in which the patient attends annual eye exams? *** If pt is not established with a provider, would they like to be referred to a provider to establish care? {YES/NO:21197}.   Dental Screening: Recommended annual dental exams for proper oral hygiene  Community Resource Referral / Chronic Care Management: CRR required this visit?  {YES/NO:21197}  CCM required this visit?  {YES/NO:21197}     Plan:     I have personally reviewed and noted the following in the patient's chart:   Medical and social history Use of alcohol, tobacco or illicit drugs  Current medications and supplements including opioid prescriptions. {Opioid Prescriptions:[phone]} Functional ability and status Nutritional status Physical activity Advanced directives List of other physicians Hospitalizations, surgeries, and ER visits in previous 12 months Vitals Screenings to include cognitive, depression, and falls Referrals and appointments  In addition, I have reviewed and discussed with patient certain preventive protocols, quality metrics, and best practice recommendations. A written personalized care plan for preventive services as well as general preventive health recommendations were provided to patient.     Vanetta Mulders, [place]   01/18/5637   Due to this being a virtual visit, the after visit summary with patients personalized plan was offered to patient via mail or my-chart. ***Patient declined at this time./ Patient would like to access on my-chart/ per request, patient was mailed a copy of AVS./ Patient preferred to pick up at office at next visit   Nurse Notes: ***

## 2022-10-17 ENCOUNTER — Ambulatory Visit (INDEPENDENT_AMBULATORY_CARE_PROVIDER_SITE_OTHER): Payer: Medicare Other

## 2022-10-17 ENCOUNTER — Other Ambulatory Visit: Payer: Self-pay | Admitting: Family Medicine

## 2022-10-17 VITALS — Ht 66.0 in | Wt 155.0 lb

## 2022-10-17 DIAGNOSIS — Z1231 Encounter for screening mammogram for malignant neoplasm of breast: Secondary | ICD-10-CM

## 2022-10-17 DIAGNOSIS — Z0000 Encounter for general adult medical examination without abnormal findings: Secondary | ICD-10-CM | POA: Diagnosis not present

## 2022-10-19 ENCOUNTER — Other Ambulatory Visit: Payer: Medicare Other

## 2022-10-19 ENCOUNTER — Other Ambulatory Visit: Payer: Self-pay | Admitting: Family Medicine

## 2022-10-19 DIAGNOSIS — Z0000 Encounter for general adult medical examination without abnormal findings: Secondary | ICD-10-CM | POA: Diagnosis not present

## 2022-10-19 DIAGNOSIS — E785 Hyperlipidemia, unspecified: Secondary | ICD-10-CM | POA: Diagnosis not present

## 2022-10-19 DIAGNOSIS — Z136 Encounter for screening for cardiovascular disorders: Secondary | ICD-10-CM | POA: Diagnosis not present

## 2022-10-19 DIAGNOSIS — R6889 Other general symptoms and signs: Secondary | ICD-10-CM | POA: Diagnosis not present

## 2022-10-19 DIAGNOSIS — K589 Irritable bowel syndrome without diarrhea: Secondary | ICD-10-CM | POA: Diagnosis not present

## 2022-10-19 LAB — URINALYSIS
Bilirubin, UA: NEGATIVE
Glucose, UA: NEGATIVE
Ketones, UA: NEGATIVE
Leukocytes,UA: NEGATIVE
Nitrite, UA: POSITIVE — AB
Protein,UA: NEGATIVE
RBC, UA: NEGATIVE
Specific Gravity, UA: 1.015 (ref 1.005–1.030)
Urobilinogen, Ur: 0.2 mg/dL (ref 0.2–1.0)
pH, UA: 6 (ref 5.0–7.5)

## 2022-10-19 MED ORDER — AMOXICILLIN 500 MG PO CAPS: 500.0000 mg | ORAL_CAPSULE | Freq: Three times a day (TID) | ORAL | 0 refills | Status: DC

## 2022-10-20 LAB — CMP14+EGFR
ALT: 8 IU/L (ref 0–32)
AST: 16 IU/L (ref 0–40)
Albumin/Globulin Ratio: 1.6 (ref 1.2–2.2)
Albumin: 4.4 g/dL (ref 3.9–4.9)
Alkaline Phosphatase: 152 IU/L — ABNORMAL HIGH (ref 44–121)
BUN/Creatinine Ratio: 9 — ABNORMAL LOW (ref 12–28)
BUN: 9 mg/dL (ref 8–27)
Bilirubin Total: 0.2 mg/dL (ref 0.0–1.2)
CO2: 22 mmol/L (ref 20–29)
Calcium: 9.5 mg/dL (ref 8.7–10.3)
Chloride: 101 mmol/L (ref 96–106)
Creatinine, Ser: 1.04 mg/dL — ABNORMAL HIGH (ref 0.57–1.00)
Globulin, Total: 2.7 g/dL (ref 1.5–4.5)
Glucose: 107 mg/dL — ABNORMAL HIGH (ref 70–99)
Potassium: 4.8 mmol/L (ref 3.5–5.2)
Sodium: 140 mmol/L (ref 134–144)
Total Protein: 7.1 g/dL (ref 6.0–8.5)
eGFR: 59 mL/min/{1.73_m2} — ABNORMAL LOW (ref >59)

## 2022-10-20 LAB — CBC WITH DIFFERENTIAL/PLATELET
Basophils Absolute: 0.1 10*3/uL (ref 0.0–0.2)
Basos: 1 %
EOS (ABSOLUTE): 0.1 10*3/uL (ref 0.0–0.4)
Eos: 2 %
Hematocrit: 45.6 % (ref 34.0–46.6)
Hemoglobin: 15 g/dL (ref 11.1–15.9)
Immature Grans (Abs): 0 10*3/uL (ref 0.0–0.1)
Immature Granulocytes: 0 %
Lymphocytes Absolute: 1.7 10*3/uL (ref 0.7–3.1)
Lymphs: 28 %
MCH: 29.4 pg (ref 26.6–33.0)
MCHC: 32.9 g/dL (ref 31.5–35.7)
MCV: 89 fL (ref 79–97)
Monocytes Absolute: 0.5 10*3/uL (ref 0.1–0.9)
Monocytes: 8 %
Neutrophils Absolute: 3.9 10*3/uL (ref 1.4–7.0)
Neutrophils: 61 %
Platelets: 335 10*3/uL (ref 150–450)
RBC: 5.1 x10E6/uL (ref 3.77–5.28)
RDW: 13.2 % (ref 11.7–15.4)
WBC: 6.3 10*3/uL (ref 3.4–10.8)

## 2022-10-20 LAB — LIPID PANEL
Chol/HDL Ratio: 4.3 ratio (ref 0.0–4.4)
Cholesterol, Total: 269 mg/dL — ABNORMAL HIGH (ref 100–199)
HDL: 62 mg/dL (ref >39)
LDL Chol Calc (NIH): 194 mg/dL — ABNORMAL HIGH (ref 0–99)
Triglycerides: 81 mg/dL (ref 0–149)
VLDL Cholesterol Cal: 13 mg/dL (ref 5–40)

## 2022-10-20 LAB — VITAMIN D 25 HYDROXY (VIT D DEFICIENCY, FRACTURES): Vit D, 25-Hydroxy: 24.6 ng/mL — ABNORMAL LOW (ref 30.0–100.0)

## 2022-10-31 ENCOUNTER — Telehealth: Payer: Self-pay | Admitting: Family Medicine

## 2022-10-31 NOTE — Telephone Encounter (Signed)
Refer to Almyra Free done

## 2022-11-01 ENCOUNTER — Encounter: Payer: Self-pay | Admitting: Family Medicine

## 2022-11-02 ENCOUNTER — Ambulatory Visit
Admission: RE | Admit: 2022-11-02 | Discharge: 2022-11-02 | Disposition: A | Discharge status: Home / Self Care | Payer: Medicare Other | Source: Ambulatory Visit | Attending: Family Medicine | Admitting: Family Medicine

## 2022-11-02 DIAGNOSIS — Z1231 Encounter for screening mammogram for malignant neoplasm of breast: Secondary | ICD-10-CM

## 2022-11-03 NOTE — Telephone Encounter (Signed)
Patient calling to check on this because she has not heard back. Please call back and advise.

## 2022-11-03 NOTE — Telephone Encounter (Signed)
SPOKE WITH PATIENT AND APPOINTMENT SCHEDULED WITH JULIE

## 2022-11-08 ENCOUNTER — Other Ambulatory Visit: Payer: Self-pay | Admitting: Family Medicine

## 2022-11-08 DIAGNOSIS — R928 Other abnormal and inconclusive findings on diagnostic imaging of breast: Secondary | ICD-10-CM

## 2022-11-16 ENCOUNTER — Ambulatory Visit
Admission: RE | Admit: 2022-11-16 | Discharge: 2022-11-16 | Disposition: A | Discharge status: Home / Self Care | Payer: Medicare Other | Source: Ambulatory Visit | Attending: Family Medicine | Admitting: Family Medicine

## 2022-11-16 DIAGNOSIS — R921 Mammographic calcification found on diagnostic imaging of breast: Secondary | ICD-10-CM | POA: Diagnosis not present

## 2022-11-16 DIAGNOSIS — R928 Other abnormal and inconclusive findings on diagnostic imaging of breast: Secondary | ICD-10-CM

## 2022-11-17 ENCOUNTER — Other Ambulatory Visit: Payer: Self-pay | Admitting: Family Medicine

## 2022-11-17 DIAGNOSIS — R921 Mammographic calcification found on diagnostic imaging of breast: Secondary | ICD-10-CM

## 2022-11-24 ENCOUNTER — Ambulatory Visit (INDEPENDENT_AMBULATORY_CARE_PROVIDER_SITE_OTHER): Payer: Medicare Other | Admitting: Pharmacist

## 2022-11-24 DIAGNOSIS — E782 Mixed hyperlipidemia: Secondary | ICD-10-CM | POA: Diagnosis not present

## 2022-11-24 MED ORDER — PRALUENT 150 MG/ML ~~LOC~~ SOAJ: 150.0000 mg | SUBCUTANEOUS | 5 refills | Status: DC

## 2022-12-12 ENCOUNTER — Ambulatory Visit
Admission: RE | Admit: 2022-12-12 | Discharge: 2022-12-12 | Disposition: A | Discharge status: Home / Self Care | Payer: Medicare Other | Source: Ambulatory Visit | Attending: Family Medicine | Admitting: Family Medicine

## 2022-12-12 DIAGNOSIS — R921 Mammographic calcification found on diagnostic imaging of breast: Secondary | ICD-10-CM

## 2022-12-12 DIAGNOSIS — D241 Benign neoplasm of right breast: Secondary | ICD-10-CM | POA: Diagnosis not present

## 2022-12-12 DIAGNOSIS — N6021 Fibroadenosis of right breast: Secondary | ICD-10-CM | POA: Diagnosis not present

## 2022-12-12 HISTORY — PX: BREAST BIOPSY: SHX20

## 2022-12-23 ENCOUNTER — Encounter: Payer: Self-pay | Admitting: Family Medicine

## 2022-12-23 ENCOUNTER — Telehealth: Payer: Self-pay | Admitting: Pharmacist

## 2022-12-23 NOTE — Telephone Encounter (Signed)
  Please follow PA submitted

## 2022-12-23 NOTE — Progress Notes (Signed)
Name: Cassandra [name] MRN: [id number] DOB: Dec 27, 1955    HPI:   Cassandra [name] is a 67 y.o. female patient referred to lipid clinic by PCP. PMH is significant for:   Past Medical History:  Diagnosis Date   Anxiety    Bowel habit changes    excessive gas /frequent BM's with change in shape with pus   Chronic left shoulder pain    frozen    Chronic pain    left upper shoulder ,neck and back   COPD (chronic obstructive pulmonary disease)    Dr. Rudi Heap   Depression    Emphysema lung    GERD (gastroesophageal reflux disease)    Hyperlipidemia    IBS (irritable bowel syndrome)    Maternal complication related to childbirth    Sepsis, staph   Meniere's disease    Nicotine addiction    2 packs per day    Skin cancer    Rt. leg, squamous cell   Tubular adenoma of colon 01/31/2000   Dr. Tinnie Gens Medoff   Weakness of pelvic floor    prolapse -  MRI from 2017       Current Medications: Current Outpatient Medications on File Prior to Visit  Medication Sig Dispense Refill   amoxicillin (AMOXIL) 500 MG capsule Take 1 capsule (500 mg total) by mouth 3 (three) times daily. 21 capsule 0   fluocinonide cream (LIDEX) 0.05 % Apply 1 Application topically 2 (two) times daily. 30 g 5   meloxicam (MOBIC) 15 MG tablet Take 15 mg by mouth daily.     No current facility-administered medications on file prior to visit.    Intolerances: statins  Risk Factors: LDL 194; family history  LDL goal: <70   Diet: encourage mediterranean   Exercise: encouraged  Family History: cardiac, HLD, HTN Former smoker  Labs: Lipid Panel     Component Value Date/Time   CHOL 269 (H) 10/19/2022 0840   TRIG 81 10/19/2022 0840   TRIG 71 12/10/2013 1702   HDL 62 10/19/2022 0840   HDL 64 12/10/2013 1702   CHOLHDL 4.3 10/19/2022 0840   LDLCALC 194 (H) 10/19/2022 0840   LDLCALC 79 12/10/2013 1702   LABVLDL 13 10/19/2022 0840   Allergies  Allergen Reactions   Chantix [Varenicline] Rash    Ciprofloxacin    Codeine Itching and Nausea And Vomiting   Cymbalta [Duloxetine Hcl] Nausea Only   Livalo [Pitavastatin] Other (See Comments)    Seizures    Metoprolol Other (See Comments)    Had episode of possible hypotension or seizure described and muscles clamping down and searing pain toe to head and near fall x2. Loss of bowel control. No further symptoms after stopped.   Morphine And Related Hives    Itching hive rash   Oxycodone-Aspirin Nausea And Vomiting   Oxycontin [Oxycodone Hcl] Other (See Comments)    Altered mental status   Propoxyphene N-Acetaminophen Nausea And Vomiting   Remeron [Mirtazapine]     Shakes/tremors   Trazodone And Nefazodone Other (See Comments)    Medication is too strong   Zithromax [Azithromycin] Other (See Comments)    Unknown    Other Rash    Lipturzet caused enlarged veins in legs   Oxycodone-Acetaminophen Hives, Nausea And Vomiting and Rash   Septra [Sulfamethoxazole-Trimethoprim] Rash     Assessment/Plan:     1. Hyperlipidemia - Patient intolerant to statins Significant family cardiac history LDL 194-- will start Praluent PA pending  Kieth Brightly, PharmD,  BCACP Clinical Pharmacist, [facility]

## 2022-12-26 ENCOUNTER — Other Ambulatory Visit (HOSPITAL_COMMUNITY): Payer: Self-pay

## 2022-12-26 NOTE — Telephone Encounter (Signed)
Patient Advocate Encounter  Prior Authorization for Praluent 150MG /ML auto-injectors has been approved.    PA# [id number] Effective dates: 12/23/22 through 06/24/23

## 2023-01-11 ENCOUNTER — Encounter: Payer: Self-pay | Admitting: Pharmacist

## 2023-01-11 NOTE — Telephone Encounter (Signed)
Praluent approved Message sent to patient

## 2023-02-14 ENCOUNTER — Encounter: Payer: Self-pay | Admitting: Family Medicine

## 2023-02-14 ENCOUNTER — Ambulatory Visit (INDEPENDENT_AMBULATORY_CARE_PROVIDER_SITE_OTHER): Payer: Medicare Other | Admitting: Family Medicine

## 2023-02-14 VITALS — BP 163/95 | HR 72 | Temp 97.3°F | Ht 66.0 in | Wt 161.2 lb

## 2023-02-14 DIAGNOSIS — F17210 Nicotine dependence, cigarettes, uncomplicated: Secondary | ICD-10-CM | POA: Diagnosis not present

## 2023-02-14 DIAGNOSIS — T466X5A Adverse effect of antihyperlipidemic and antiarteriosclerotic drugs, initial encounter: Secondary | ICD-10-CM | POA: Diagnosis not present

## 2023-02-14 DIAGNOSIS — G720 Drug-induced myopathy: Secondary | ICD-10-CM | POA: Diagnosis not present

## 2023-02-14 DIAGNOSIS — I700 Atherosclerosis of aorta: Secondary | ICD-10-CM

## 2023-02-14 DIAGNOSIS — E782 Mixed hyperlipidemia: Secondary | ICD-10-CM | POA: Diagnosis not present

## 2023-02-14 DIAGNOSIS — F17200 Nicotine dependence, unspecified, uncomplicated: Secondary | ICD-10-CM

## 2023-02-14 MED ORDER — FLUTICASONE PROPIONATE 50 MCG/ACT NA SUSP: 2.0000 | Freq: Every day | NASAL | 6 refills | Status: AC

## 2023-02-14 NOTE — Progress Notes (Signed)
Subjective:  Patient ID: Cassandra [name], female    DOB: 1956/05/12  Age: 67 y.o. MRN: [id number]  CC: Medical Management of Chronic Issues   HPI Cassandra [name] presents for concerned that praluent causing cough, sinus and bruising. Stiff when she starts moving. She has had sinus problems in the past, but the current sx started when she started the praluent between 4-6 weeks ago. Not sure whether to continue the med, praluent.  Couldn't tolerate multiple cholesterol meds in the past including statins. Has multiple risk factors. Still smoking. Positive family history. Has aortic atherosclerosis as well.     02/14/2023    9:17 AM 02/14/2023    9:09 AM 10/17/2022   10:26 AM  Depression screen PHQ 2/9  Decreased Interest 1 0 0  Down, Depressed, Hopeless 0 0 0  PHQ - 2 Score 1 0 0  Altered sleeping 1    Tired, decreased energy 1    Change in appetite 0    Feeling bad or failure about yourself  0    Trouble concentrating 1    Moving slowly or fidgety/restless 0    Suicidal thoughts 0    PHQ-9 Score 4    Difficult doing work/chores Not difficult at all      History Cassandra [name] has a past medical history of Anxiety, Bowel habit changes, Chronic left shoulder pain, Chronic pain, COPD (chronic obstructive pulmonary disease) (HCC), Depression, Emphysema lung (HCC), Fecal incontinence, GERD (gastroesophageal reflux disease), Hyperlipidemia, IBS (irritable bowel syndrome), Iliotibial band syndrome of right side (09/20/2013), Maternal complication related to childbirth, Meniere's disease, Nicotine addiction, Orthostatic hypotension (06/12/2019), Skin cancer, Syncope and collapse (06/12/2019), Tubular adenoma of colon (01/31/2000), and Weakness of pelvic floor.   She has a past surgical history that includes Bladder repair; Shoulder arthroscopy; Abdominal hysterectomy; Knee arthroscopy (Left); Cesarean section; ruptured uterus; Anal Rectal manometry (N/A, 12/09/2015); and Breast biopsy (Right, 12/12/2022).    Her family history includes COPD in her brother; Colon cancer in an other family member; Colon polyps in her sister; Coronary artery disease in her brother, father, and mother; Diabetes in her mother; Early death in her son; Heart disease in her sister; Kidney disease in her mother; Liver cancer in her maternal grandmother; Liver disease in her maternal grandmother; Lung cancer in her paternal grandmother; Lung disease in her father; Polymyalgia rheumatica in her sister; Prostate cancer in her father.She reports that she quit smoking about 7 years ago. Her smoking use included cigarettes. She has a 42.00 pack-year smoking history. She has never used smokeless tobacco. She reports that she does not drink alcohol and does not use drugs.    ROS Review of Systems  Constitutional: Negative.   HENT:  Negative for congestion.   Eyes:  Negative for visual disturbance.  Respiratory:  Negative for shortness of breath.   Cardiovascular:  Negative for chest pain.  Gastrointestinal:  Negative for abdominal pain, constipation, diarrhea, nausea and vomiting.  Genitourinary:  Negative for difficulty urinating.  Musculoskeletal:  Positive for arthralgias (some shoulder discomfort chronically.). Negative for myalgias.  Neurological:  Negative for headaches.  Psychiatric/Behavioral:  Negative for sleep disturbance.     Objective:  BP (!) 163/95   Pulse 72   Temp (!) 97.3 F (36.3 C)   Ht 5\' 6"  (1.676 m)   Wt 161 lb 3.2 oz (73.1 kg)   SpO2 98%   BMI 26.02 kg/m   BP Readings from Last 3 Encounters:  02/14/23 (!) 163/95  08/12/21 112/65  01/14/21  121/74    Wt Readings from Last 3 Encounters:  02/14/23 161 lb 3.2 oz (73.1 kg)  10/17/22 155 lb (70.3 kg)  08/12/21 156 lb 12.8 oz (71.1 kg)     Physical Exam Constitutional:      General: She is not in acute distress.    Appearance: She is well-developed.  Cardiovascular:     Rate and Rhythm: Normal rate and regular rhythm.  Pulmonary:      Breath sounds: Normal breath sounds.  Musculoskeletal:        General: Normal range of motion.  Skin:    General: Skin is warm and dry.  Neurological:     Mental Status: She is alert and oriented to person, place, and time.       Assessment & Plan:   Cassandra [name] was seen today for medical management of chronic issues.  Diagnoses and all orders for this visit:  Mixed hyperlipidemia -     CBC with Differential/Platelet -     CMP14+EGFR -     Lipid panel  Encounter for screening for malignant neoplasm of lung in current smoker with 30 pack year history or greater -     CT CHEST LUNG CANCER SCREENING LOW DOSE WO CONTRAST; Future  Statin myopathy  Aortic atherosclerosis (HCC)  Other orders -     fluticasone (FLONASE) 50 MCG/ACT nasal spray; Place 2 sprays into both nostrils daily.       I am having Cassandra Roup. Krohn "[name]" start on fluticasone. I am also having her maintain her meloxicam, fluocinonide cream, and Praluent.  Allergies as of 02/14/2023       Reactions   Chantix [varenicline] Rash   Ciprofloxacin    Codeine Itching, Nausea And Vomiting   Cymbalta [duloxetine Hcl] Nausea Only   Livalo [pitavastatin] Other (See Comments)   Seizures    Metoprolol Other (See Comments)   Had episode of possible hypotension or seizure described and muscles clamping down and searing pain toe to head and near fall x2. Loss of bowel control. No further symptoms after stopped.   Morphine And Codeine Hives   Itching hive rash   Oxycodone-aspirin Nausea And Vomiting   Oxycontin [oxycodone Hcl] Other (See Comments)   Altered mental status   Propoxyphene N-acetaminophen Nausea And Vomiting   Remeron [mirtazapine]    Shakes/tremors   Trazodone And Nefazodone Other (See Comments)   Medication is too strong   Zithromax [azithromycin] Other (See Comments)   Unknown    Other Rash   Lipturzet caused enlarged veins in legs   Oxycodone-acetaminophen Hives, Nausea And Vomiting, Rash   Septra  [sulfamethoxazole-trimethoprim] Rash        Medication List        Accurate as of February 14, 2023 10:25 AM. If you have any questions, ask your nurse or doctor.          fluocinonide cream 0.05 % Commonly known as: LIDEX Apply 1 Application topically 2 (two) times daily.   fluticasone 50 MCG/ACT nasal spray Commonly known as: FLONASE Place 2 sprays into both nostrils daily. Started by: Mechele Claude, MD   meloxicam 15 MG tablet Commonly known as: MOBIC Take 15 mg by mouth daily.   Praluent 150 MG/ML Soaj Generic drug: Alirocumab Inject 150 mg into the skin every 14 (fourteen) days.         Follow-up: Return in about 6 months (around 08/16/2023).  Mechele Claude, M.D.

## 2023-02-15 ENCOUNTER — Other Ambulatory Visit: Payer: Self-pay | Admitting: Family Medicine

## 2023-02-15 DIAGNOSIS — F17200 Nicotine dependence, unspecified, uncomplicated: Secondary | ICD-10-CM

## 2023-02-15 LAB — CMP14+EGFR
ALT: 9 IU/L (ref 0–32)
AST: 16 IU/L (ref 0–40)
Albumin/Globulin Ratio: 1.4 (ref 1.2–2.2)
Albumin: 4.2 g/dL (ref 3.9–4.9)
Alkaline Phosphatase: 147 IU/L — ABNORMAL HIGH (ref 44–121)
BUN/Creatinine Ratio: 10 — ABNORMAL LOW (ref 12–28)
BUN: 10 mg/dL (ref 8–27)
Bilirubin Total: 0.2 mg/dL (ref 0.0–1.2)
CO2: 24 mmol/L (ref 20–29)
Calcium: 9.5 mg/dL (ref 8.7–10.3)
Chloride: 104 mmol/L (ref 96–106)
Creatinine, Ser: 1.04 mg/dL — ABNORMAL HIGH (ref 0.57–1.00)
Globulin, Total: 3.1 g/dL (ref 1.5–4.5)
Glucose: 111 mg/dL — ABNORMAL HIGH (ref 70–99)
Potassium: 5.1 mmol/L (ref 3.5–5.2)
Sodium: 142 mmol/L (ref 134–144)
Total Protein: 7.3 g/dL (ref 6.0–8.5)
eGFR: 59 mL/min/{1.73_m2} — ABNORMAL LOW (ref >59)

## 2023-02-15 LAB — CBC WITH DIFFERENTIAL/PLATELET
Basophils Absolute: 0.1 10*3/uL (ref 0.0–0.2)
Basos: 1 %
EOS (ABSOLUTE): 0.1 10*3/uL (ref 0.0–0.4)
Eos: 2 %
Hematocrit: 45 % (ref 34.0–46.6)
Hemoglobin: 14.8 g/dL (ref 11.1–15.9)
Immature Grans (Abs): 0 10*3/uL (ref 0.0–0.1)
Immature Granulocytes: 0 %
Lymphocytes Absolute: 2.3 10*3/uL (ref 0.7–3.1)
Lymphs: 30 %
MCH: 28.7 pg (ref 26.6–33.0)
MCHC: 32.9 g/dL (ref 31.5–35.7)
MCV: 87 fL (ref 79–97)
Monocytes Absolute: 0.5 10*3/uL (ref 0.1–0.9)
Monocytes: 7 %
Neutrophils Absolute: 4.6 10*3/uL (ref 1.4–7.0)
Neutrophils: 60 %
Platelets: 376 10*3/uL (ref 150–450)
RBC: 5.15 x10E6/uL (ref 3.77–5.28)
RDW: 14 % (ref 11.7–15.4)
WBC: 7.6 10*3/uL (ref 3.4–10.8)

## 2023-02-15 LAB — LIPID PANEL
Chol/HDL Ratio: 2.8 ratio (ref 0.0–4.4)
Cholesterol, Total: 161 mg/dL (ref 100–199)
HDL: 57 mg/dL (ref >39)
LDL Chol Calc (NIH): 89 mg/dL (ref 0–99)
Triglycerides: 78 mg/dL (ref 0–149)
VLDL Cholesterol Cal: 15 mg/dL (ref 5–40)

## 2023-02-15 NOTE — Progress Notes (Signed)
Hello Tiki,  Your lab result is normal and/or stable.Some minor variations that are not significant are commonly marked abnormal, but do not represent any medical problem for you.  Best regards, Justyne Roell, M.D.

## 2023-03-03 ENCOUNTER — Other Ambulatory Visit: Payer: Self-pay | Admitting: *Deleted

## 2023-03-03 DIAGNOSIS — E782 Mixed hyperlipidemia: Secondary | ICD-10-CM

## 2023-03-03 MED ORDER — PRALUENT 150 MG/ML ~~LOC~~ SOAJ
150.0000 mg | SUBCUTANEOUS | 1 refills | Status: DC
Start: 2023-03-03 — End: 2023-08-16

## 2023-03-21 ENCOUNTER — Encounter: Payer: Self-pay | Admitting: Family Medicine

## 2023-08-16 ENCOUNTER — Ambulatory Visit (INDEPENDENT_AMBULATORY_CARE_PROVIDER_SITE_OTHER): Payer: Medicare Other | Admitting: Family Medicine

## 2023-08-16 ENCOUNTER — Encounter: Payer: Self-pay | Admitting: Family Medicine

## 2023-08-16 VITALS — BP 157/73 | HR 70 | Temp 96.8°F | Ht 66.0 in | Wt 163.2 lb

## 2023-08-16 DIAGNOSIS — M5412 Radiculopathy, cervical region: Secondary | ICD-10-CM

## 2023-08-16 DIAGNOSIS — E782 Mixed hyperlipidemia: Secondary | ICD-10-CM | POA: Diagnosis not present

## 2023-08-16 DIAGNOSIS — E559 Vitamin D deficiency, unspecified: Secondary | ICD-10-CM

## 2023-08-16 DIAGNOSIS — G720 Drug-induced myopathy: Secondary | ICD-10-CM | POA: Diagnosis not present

## 2023-08-16 DIAGNOSIS — T466X5A Adverse effect of antihyperlipidemic and antiarteriosclerotic drugs, initial encounter: Secondary | ICD-10-CM

## 2023-08-16 DIAGNOSIS — G4700 Insomnia, unspecified: Secondary | ICD-10-CM | POA: Diagnosis not present

## 2023-08-16 DIAGNOSIS — J0100 Acute maxillary sinusitis, unspecified: Secondary | ICD-10-CM

## 2023-08-16 DIAGNOSIS — N12 Tubulo-interstitial nephritis, not specified as acute or chronic: Secondary | ICD-10-CM | POA: Diagnosis not present

## 2023-08-16 DIAGNOSIS — R109 Unspecified abdominal pain: Secondary | ICD-10-CM

## 2023-08-16 DIAGNOSIS — M7918 Myalgia, other site: Secondary | ICD-10-CM | POA: Diagnosis not present

## 2023-08-16 LAB — URINALYSIS, ROUTINE W REFLEX MICROSCOPIC
Bilirubin, UA: NEGATIVE
Glucose, UA: NEGATIVE
Ketones, UA: NEGATIVE
Leukocytes,UA: NEGATIVE
Nitrite, UA: POSITIVE — AB
Protein,UA: NEGATIVE
RBC, UA: NEGATIVE
Specific Gravity, UA: 1.025 (ref 1.005–1.030)
Urobilinogen, Ur: 0.2 mg/dL (ref 0.2–1.0)
pH, UA: 5.5 (ref 5.0–7.5)

## 2023-08-16 LAB — MICROSCOPIC EXAMINATION
RBC, Urine: NONE SEEN /[HPF] (ref 0–2)
Renal Epithel, UA: NONE SEEN /[HPF]
Yeast, UA: NONE SEEN

## 2023-08-16 MED ORDER — LEVOFLOXACIN 500 MG PO TABS: 500.0000 mg | ORAL_TABLET | Freq: Every day | ORAL | 0 refills | Status: DC

## 2023-08-16 NOTE — Progress Notes (Signed)
Subjective:  Patient ID: Cassandra [name], female    DOB: 01/20/1956  Age: 67 y.o. MRN: [id number]  CC: Medical Management of Chronic Issues   HPI Cassandra [name] presents for follow-up of elevated cholesterol. Doing well without complaints on current medication. Had side effects of statin including myalgia and arthralgia and nausea. Tried praluent. Also in today for liver function testing. Currently no chest pain, shortness of breath or other cardiovascular related symptoms noted.   Having severe left flank pain. Moving to [place]. Onset a week. Stinging , burning, aching. 8/10 severity.   Sinus right cheek. Yellow rhino. Onset several weeks ago.   Happier off of pain meds. Felt like she was in a stupor.      08/16/2023    8:17 AM 08/16/2023    8:08 AM 02/14/2023    9:17 AM 02/14/2023    9:09 AM 10/17/2022   10:26 AM  Depression screen PHQ 2/9  Decreased Interest 2 0 1 0 0  Down, Depressed, Hopeless 1 0 0 0 0  PHQ - 2 Score 3 0 1 0 0  Altered sleeping 0  1    Tired, decreased energy 1  1    Change in appetite 0  0    Feeling bad or failure about yourself  0  0    Trouble concentrating 0  1    Moving slowly or fidgety/restless 0  0    Suicidal thoughts 0  0    PHQ-9 Score 4  4    Difficult doing work/chores Not difficult at all  Not difficult at all      History Cassandra [name] has a past medical history of Anxiety, Bowel habit changes, Chronic left shoulder pain, Chronic pain, COPD (chronic obstructive pulmonary disease) (HCC), Depression, Emphysema lung (HCC), Fecal incontinence, GERD (gastroesophageal reflux disease), Hyperlipidemia, IBS (irritable bowel syndrome), Iliotibial band syndrome of right side (09/20/2013), Maternal complication related to childbirth, Meniere's disease, Nicotine addiction, Orthostatic hypotension (06/12/2019), Skin cancer, Syncope and collapse (06/12/2019), Tubular adenoma of colon (01/31/2000), and Weakness of pelvic floor.   She has a past surgical history that  includes Bladder repair; Shoulder arthroscopy; Abdominal hysterectomy; Knee arthroscopy (Left); Cesarean section; ruptured uterus; Anal Rectal manometry (N/A, 12/09/2015); and Breast biopsy (Right, 12/12/2022).   Her family history includes COPD in her brother; Colon cancer in an other family member; Colon polyps in her sister; Coronary artery disease in her brother, father, and mother; Diabetes in her mother; Early death in her son; Heart disease in her sister; Kidney disease in her mother; Liver cancer in her maternal grandmother; Liver disease in her maternal grandmother; Lung cancer in her paternal grandmother; Lung disease in her father; Polymyalgia rheumatica in her sister; Prostate cancer in her father.She reports that she quit smoking about 8 years ago. Her smoking use included cigarettes. She started smoking about 50 years ago. She has a 42 pack-year smoking history. She has never used smokeless tobacco. She reports that she does not drink alcohol and does not use drugs.  Current Outpatient Medications on File Prior to Visit  Medication Sig Dispense Refill   Alirocumab (PRALUENT) 150 MG/ML SOAJ Inject 1 mL (150 mg total) into the skin every 14 (fourteen) days. 6 mL 1   fluocinonide cream (LIDEX) 0.05 % Apply 1 Application topically 2 (two) times daily. 30 g 5   fluticasone (FLONASE) 50 MCG/ACT nasal spray Place 2 sprays into both nostrils daily. 16 g 6   meloxicam (MOBIC) 15 MG tablet Take 15  mg by mouth daily.     No current facility-administered medications on file prior to visit.    ROS Review of Systems  Constitutional:  Positive for chills (a little, occasionally). Negative for fever.  HENT:  Negative for congestion.   Eyes:  Negative for visual disturbance.  Respiratory:  Negative for shortness of breath.   Cardiovascular:  Negative for chest pain.  Gastrointestinal:  Negative for abdominal pain, constipation, diarrhea, nausea and vomiting.  Genitourinary:  Negative for difficulty  urinating.  Musculoskeletal:  Positive for arthralgias and myalgias.  Neurological:  Negative for headaches.  Psychiatric/Behavioral:  Negative for sleep disturbance.     Objective:  BP (!) 157/73   Pulse 70   Temp (!) 96.8 F (36 C)   Ht 5\' 6"  (1.676 m)   Wt 163 lb 3.2 oz (74 kg)   SpO2 97%   BMI 26.34 kg/m   BP Readings from Last 3 Encounters:  08/16/23 (!) 157/73  02/14/23 (!) 163/95  08/12/21 112/65    Wt Readings from Last 3 Encounters:  08/16/23 163 lb 3.2 oz (74 kg)  02/14/23 161 lb 3.2 oz (73.1 kg)  10/17/22 155 lb (70.3 kg)     Physical Exam Constitutional:      General: She is not in acute distress.    Appearance: She is well-developed.  HENT:     Head: Normocephalic and atraumatic.     Nose: Congestion present.     Comments: Max sinuses tender Eyes:     Conjunctiva/sclera: Conjunctivae normal.     Pupils: Pupils are equal, round, and reactive to light.  Neck:     Thyroid: No thyromegaly.  Cardiovascular:     Rate and Rhythm: Normal rate and regular rhythm.     Heart sounds: Normal heart sounds. No murmur heard. Pulmonary:     Effort: Pulmonary effort is normal. No respiratory distress.     Breath sounds: Normal breath sounds. No wheezing or rales.  Abdominal:     General: Bowel sounds are normal. There is no distension.     Palpations: Abdomen is soft.     Tenderness: There is abdominal tenderness (LUQ).  Musculoskeletal:        General: Normal range of motion.     Cervical back: Normal range of motion and neck supple.  Lymphadenopathy:     Cervical: No cervical adenopathy.  Skin:    General: Skin is warm and dry.  Neurological:     Mental Status: She is alert and oriented to person, place, and time.  Psychiatric:        Behavior: Behavior normal.        Thought Content: Thought content normal.        Judgment: Judgment normal.     No results found for: "HGBA1C"  Lab Results  Component Value Date   WBC 7.6 02/14/2023   HGB 14.8  02/14/2023   HCT 45.0 02/14/2023   PLT 376 02/14/2023   GLUCOSE 111 (H) 02/14/2023   CHOL 161 02/14/2023   TRIG 78 02/14/2023   HDL 57 02/14/2023   LDLCALC 89 02/14/2023   ALT 9 02/14/2023   AST 16 02/14/2023   NA 142 02/14/2023   K 5.1 02/14/2023   CL 104 02/14/2023   CREATININE 1.04 (H) 02/14/2023   BUN 10 02/14/2023   CO2 24 02/14/2023   TSH 1.410 11/26/2018    MM RT BREAST BX W LOC DEV 1ST LESION IMAGE BX SPEC STEREO GUIDE  Addendum Date: 12/14/2022  ADDENDUM REPORT: 12/14/2022 07:55 ADDENDUM: Pathology revealed BENIGN BREAST TISSUE WITH SCLEROSING ADENOSIS AND FIBROADENOMA, BOTH WITH ASSOCIATED CALCIFICATIONS of the RIGHT breast, upper outer quadrant, (venus clip). This was found to be concordant by Dr. Norva Pavlov. Pathology results were discussed with the patient by telephone. The patient reported doing well after the biopsy with tenderness at the site. Post biopsy instructions and care were reviewed and questions were answered. The patient was encouraged to call [facility] for any additional concerns. The patient was instructed to return for annual screening mammography in February 2025. Pathology results reported by Rene Kocher, RN on 12/13/2022. Electronically Signed   By: Norva Pavlov M.D.   On: 12/14/2022 07:55   Result Date: 12/14/2022 CLINICAL DATA:  Patient presents for stereotactic biopsy of RIGHT breast calcifications. EXAM: RIGHT BREAST STEREOTACTIC CORE NEEDLE BIOPSY COMPARISON:  Previous exam(s). FINDINGS: The patient and I discussed the procedure of stereotactic-guided biopsy including benefits and alternatives. We discussed the high likelihood of a successful procedure. We discussed the risks of the procedure including infection, bleeding, tissue injury, clip migration, and inadequate sampling. Informed written consent was given. The usual time out protocol was performed immediately prior to the procedure. Using sterile technique and  lidocaine and lidocaine with epinephrine as local anesthetic, under stereotactic guidance, a 9 gauge vacuum assisted device was used to perform core needle biopsy of calcifications in the UPPER-OUTER QUADRANT the RIGHT breast using a craniocaudal approach. Specimen radiograph was performed showing calcifications in tissue samples. Specimens with calcifications are identified for pathology. Lesion quadrant: UPPER-OUTER QUADRANT RIGHT breast At the conclusion of the procedure, Venus shaped tissue marker clip was deployed into the biopsy cavity. Follow-up 2-view mammogram was performed and dictated separately. IMPRESSION: Stereotactic-guided biopsy of RIGHT breast calcifications. No apparent complications. Electronically Signed: By: Norva Pavlov M.D. On: 12/12/2022 11:17  MM CLIP PLACEMENT RIGHT  Result Date: 12/12/2022 CLINICAL DATA:  Status post biopsy of calcifications in the UPPER-OUTER QUADRANT of the RIGHT breast. EXAM: 3D DIAGNOSTIC RIGHT MAMMOGRAM POST STEREOTACTIC BIOPSY COMPARISON:  Previous exam(s). FINDINGS: 3D Mammographic images were obtained following stereotactic guided biopsy of calcifications in the UPPER-OUTER QUADRANT of the RIGHT breast and placement a Venus shaped. The biopsy marking clip is in expected position at the site of biopsy. IMPRESSION: Appropriate positioning of the Venus shaped biopsy marking clip at the site of biopsy in the UPPER-OUTER QUADRANT of the RIGHT breast. Final Assessment: Post Procedure Mammograms for Marker Placement Electronically Signed   By: Norva Pavlov M.D.   On: 12/12/2022 11:25   Assessment & Plan:   Cassandra "[name]" was seen today for medical management of chronic issues.  Diagnoses and all orders for this visit:  Mixed hyperlipidemia  Vitamin D deficiency  Flank pain   I am having Cassandra [name]. Cassandra "[name]" maintain her meloxicam, fluocinonide cream, fluticasone, and Praluent.  No orders of the defined types were placed in this  encounter.    Follow-up: No follow-ups on file.  Mechele Claude, M.D.

## 2023-08-16 NOTE — Addendum Note (Signed)
Addended by: Adella Hare B on: 08/16/2023 09:19 AM   Modules accepted: Orders

## 2023-09-05 ENCOUNTER — Encounter: Payer: Self-pay | Admitting: Family Medicine

## 2023-09-11 ENCOUNTER — Encounter: Payer: Self-pay | Admitting: Family Medicine

## 2023-09-11 ENCOUNTER — Ambulatory Visit: Payer: Medicare Other | Admitting: Family Medicine

## 2023-09-11 VITALS — BP 167/76 | HR 75 | Temp 97.2°F | Ht 66.0 in | Wt 156.8 lb

## 2023-09-11 DIAGNOSIS — M2550 Pain in unspecified joint: Secondary | ICD-10-CM

## 2023-09-11 DIAGNOSIS — R300 Dysuria: Secondary | ICD-10-CM

## 2023-09-11 LAB — URINALYSIS
Bilirubin, UA: NEGATIVE
Glucose, UA: NEGATIVE
Ketones, UA: NEGATIVE
Leukocytes,UA: NEGATIVE
Nitrite, UA: POSITIVE — AB
Protein,UA: NEGATIVE
RBC, UA: NEGATIVE
Specific Gravity, UA: 1.02 (ref 1.005–1.030)
Urobilinogen, Ur: 0.2 mg/dL (ref 0.2–1.0)
pH, UA: 7 (ref 5.0–7.5)

## 2023-09-11 MED ORDER — BETAMETHASONE SOD PHOS & ACET 6 (3-3) MG/ML IJ SUSP
6.0000 mg | Freq: Once | INTRAMUSCULAR | Status: AC
  Administered 2023-09-11: 6 mg via INTRAMUSCULAR

## 2023-09-11 NOTE — Progress Notes (Unsigned)
Subjective:  Patient ID: Cassandra [name], female    DOB: [birth year]  Age: 67 y.o. MRN: [id number]  CC: Medication Reaction (Levofloxacin= all over pain)   HPI Cassandra [name] presents for pain in hips and shoulders. Hurts to turn over in bed. Hurts all over. Started after she began taking levofloxacin. Took all 7 days of it. Feels emotionally "torn up" over the pain.      09/11/2023    9:58 AM 08/16/2023    8:17 AM 08/16/2023    8:08 AM  Depression screen PHQ 2/9  Decreased Interest 0 2 0  Down, Depressed, Hopeless 0 1 0  PHQ - 2 Score 0 3 0  Altered sleeping  0   Tired, decreased energy  1   Change in appetite  0   Feeling bad or failure about yourself   0   Trouble concentrating  0   Moving slowly or fidgety/restless  0   Suicidal thoughts  0   PHQ-9 Score  4   Difficult doing work/chores  Not difficult at all     History Cassandra [name] has a past medical history of Anxiety, Bowel habit changes, Chronic left shoulder pain, Chronic pain, COPD (chronic obstructive pulmonary disease) (HCC), Depression, Emphysema lung (HCC), Fecal incontinence, GERD (gastroesophageal reflux disease), Hyperlipidemia, IBS (irritable bowel syndrome), Iliotibial band syndrome of right side (09/20/2013), Maternal complication related to childbirth, Meniere's disease, Nicotine addiction, Orthostatic hypotension (06/12/2019), Skin cancer, Syncope and collapse (06/12/2019), Tubular adenoma of colon (01/31/2000), and Weakness of pelvic floor.   She has a past surgical history that includes Bladder repair; Shoulder arthroscopy; Abdominal hysterectomy; Knee arthroscopy (Left); Cesarean section; ruptured uterus; Anal Rectal manometry (N/A, 12/09/2015); and Breast biopsy (Right, 12/12/2022).   Her family history includes COPD in her brother; Colon cancer in an other family member; Colon polyps in her sister; Coronary artery disease in her brother, father, and mother; Diabetes in her mother; Early death in her son; Heart disease  in her sister; Kidney disease in her mother; Liver cancer in her maternal grandmother; Liver disease in her maternal grandmother; Lung cancer in her paternal grandmother; Lung disease in her father; Polymyalgia rheumatica in her sister; Prostate cancer in her father.She reports that she quit smoking about 8 years ago. Her smoking use included cigarettes. She started smoking about 50 years ago. She has a 42 pack-year smoking history. She has never used smokeless tobacco. She reports that she does not drink alcohol and does not use drugs.    ROS Review of Systems  Objective:  BP (!) 167/76   Pulse 75   Temp (!) 97.2 F (36.2 C)   Ht 5\' 6"  (1.676 m)   Wt 156 lb 12.8 oz (71.1 kg)   SpO2 95%   BMI 25.31 kg/m   BP Readings from Last 3 Encounters:  09/11/23 (!) 167/76  08/16/23 (!) 157/73  02/14/23 (!) 163/95    Wt Readings from Last 3 Encounters:  09/11/23 156 lb 12.8 oz (71.1 kg)  08/16/23 163 lb 3.2 oz (74 kg)  02/14/23 161 lb 3.2 oz (73.1 kg)     Physical Exam    Assessment & Plan:   There are no diagnoses linked to this encounter.     I have discontinued Cassandra [name]. [name] "[name]"'s levofloxacin. I am also having her maintain her meloxicam, fluocinonide cream, and fluticasone.  Allergies as of 09/11/2023       Reactions   Chantix [varenicline] Rash   Levofloxacin Other (See Comments)  Pain all over   Ciprofloxacin    Codeine Itching, Nausea And Vomiting   Cymbalta [duloxetine Hcl] Nausea Only   Livalo [pitavastatin] Other (See Comments)   Seizures    Metoprolol Other (See Comments)   Had episode of possible hypotension or seizure described and muscles clamping down and searing pain toe to head and near fall x2. Loss of bowel control. No further symptoms after stopped.   Morphine And Codeine Hives   Itching hive rash   Oxycodone-aspirin Nausea And Vomiting   Oxycontin [oxycodone Hcl] Other (See Comments)   Altered mental status   Propoxyphene N-acetaminophen  Nausea And Vomiting   Remeron [mirtazapine]    Shakes/tremors   Trazodone And Nefazodone Other (See Comments)   Medication is too strong   Zithromax [azithromycin] Other (See Comments)   Unknown    Other Rash   Lipturzet caused enlarged veins in legs   Oxycodone-acetaminophen Hives, Nausea And Vomiting, Rash   Septra [sulfamethoxazole-trimethoprim] Rash        Medication List        Accurate as of September 11, 2023 10:04 AM. If you have any questions, ask your nurse or doctor.          STOP taking these medications    levofloxacin 500 MG tablet Commonly known as: LEVAQUIN Stopped by: [facility]       TAKE these medications    fluocinonide cream 0.05 % Commonly known as: LIDEX Apply 1 Application topically 2 (two) times daily.   fluticasone 50 MCG/ACT nasal spray Commonly known as: FLONASE Place 2 sprays into both nostrils daily.   meloxicam 15 MG tablet Commonly known as: MOBIC Take 15 mg by mouth daily.         Follow-up: No follow-ups on file.  Mechele Claude, M.D.

## 2023-09-14 ENCOUNTER — Encounter (HOSPITAL_COMMUNITY): Payer: Self-pay

## 2023-09-14 ENCOUNTER — Encounter: Payer: Self-pay | Admitting: Family Medicine

## 2023-09-14 ENCOUNTER — Ambulatory Visit: Payer: Self-pay | Admitting: Family Medicine

## 2023-09-14 ENCOUNTER — Emergency Department (HOSPITAL_COMMUNITY)
Admission: EM | Admit: 2023-09-14 | Discharge: 2023-09-14 | Discharge status: Against Medical Advice | Payer: Medicare Other | Attending: Emergency Medicine | Admitting: Emergency Medicine

## 2023-09-14 ENCOUNTER — Emergency Department (HOSPITAL_COMMUNITY): Payer: Medicare Other

## 2023-09-14 DIAGNOSIS — R002 Palpitations: Secondary | ICD-10-CM | POA: Insufficient documentation

## 2023-09-14 DIAGNOSIS — R0602 Shortness of breath: Secondary | ICD-10-CM | POA: Insufficient documentation

## 2023-09-14 DIAGNOSIS — Z5321 Procedure and treatment not carried out due to patient leaving prior to being seen by health care provider: Secondary | ICD-10-CM | POA: Insufficient documentation

## 2023-09-14 DIAGNOSIS — I700 Atherosclerosis of aorta: Secondary | ICD-10-CM | POA: Diagnosis not present

## 2023-09-14 LAB — BASIC METABOLIC PANEL
Anion gap: 9 (ref 5–15)
BUN: 15 mg/dL (ref 8–23)
CO2: 25 mmol/L (ref 22–32)
Calcium: 8.6 mg/dL — ABNORMAL LOW (ref 8.9–10.3)
Chloride: 107 mmol/L (ref 98–111)
Creatinine, Ser: 0.89 mg/dL (ref 0.44–1.00)
GFR, Estimated: >60 mL/min (ref >60)
Glucose, Bld: 131 mg/dL — ABNORMAL HIGH (ref 70–99)
Potassium: 3.4 mmol/L — ABNORMAL LOW (ref 3.5–5.1)
Sodium: 141 mmol/L (ref 135–145)

## 2023-09-14 LAB — URINE CULTURE

## 2023-09-14 LAB — CBC
HCT: 42.3 % (ref 36.0–46.0)
Hemoglobin: 13.9 g/dL (ref 12.0–15.0)
MCH: 29.8 pg (ref 26.0–34.0)
MCHC: 32.9 g/dL (ref 30.0–36.0)
MCV: 90.6 fL (ref 80.0–100.0)
Platelets: 294 10*3/uL (ref 150–400)
RBC: 4.67 MIL/uL (ref 3.87–5.11)
RDW: 15.3 % (ref 11.5–15.5)
WBC: 9.7 10*3/uL (ref 4.0–10.5)
nRBC: 0 % (ref 0.0–0.2)

## 2023-09-14 LAB — TROPONIN I (HIGH SENSITIVITY): Troponin I (High Sensitivity): 13 ng/L (ref <18)

## 2023-09-14 MED ORDER — CEFUROXIME AXETIL 250 MG PO TABS: 250.0000 mg | ORAL_TABLET | Freq: Two times a day (BID) | ORAL | 0 refills | Status: AC

## 2023-09-14 NOTE — Telephone Encounter (Signed)
 Copied from CRM [id number]. Topic: Clinical - Red Word Triage >> Sep 14, 2023  3:19 PM Ivette P wrote: [facility] that prompted transfer to Nurse Triage: Irregular heart beat, hard to breath.  Chief Complaint: irregular heart beat Symptoms: states angry, upset, crying Frequency: constant Pertinent Negatives: Patient denies SENT TO ER Disposition: [x] ED /[] Urgent Care (no appt availability in office) / [] Appointment(In office/virtual)/ []  [facility]/ [] Home Care/ [] Refused Recommended Disposition /[] [facility]/ []  Follow-up with PCP Additional Notes: patient called in with c/o irregular heart beat.  Patient very upset and crying.  States unable to take pulse.  Instructed to go to ER if becomes worse.   Reason for Disposition  Passed out (i.e., lost consciousness, collapsed and was not responding)  Answer Assessment - Initial Assessment Questions 1. LOCATION: Where does it hurt?       Chest; it feels irregular 2. RADIATION: Does the pain go anywhere else? (e.g., into neck, jaw, arms, back)     Stays in chest 3. ONSET: When did the chest pain begin? (Minutes, hours or days)      States irregular heart beat 5. DURATION: How long does it last (e.g., seconds, minutes, hours)     constant 6. SEVERITY: How bad is the pain?  (e.g., Scale 1-10; mild, moderate, or severe)    - MILD (1-3): doesn't interfere with normal activities     - MODERATE (4-7): interferes with normal activities or awakens from sleep    - SEVERE (8-10): excruciating pain, unable to do any normal activities       Denies pain states an irregular heartbeat. Sent to ER.  Protocols used: Chest Pain-A-AH

## 2023-09-14 NOTE — ED Triage Notes (Signed)
 Pt presents to ED from home C/O palpitations and SOB since this AM. Pt thinks she is having reaction to a medication. Also reports mood changes, feeling "angry." Pt tearful in triage. Denies SI/HI. Spouse with pt in triage.

## 2023-09-17 ENCOUNTER — Encounter: Payer: Self-pay | Admitting: Cardiology

## 2023-09-25 ENCOUNTER — Other Ambulatory Visit: Payer: Self-pay

## 2023-09-25 ENCOUNTER — Telehealth: Payer: Self-pay

## 2023-09-25 DIAGNOSIS — F17210 Nicotine dependence, cigarettes, uncomplicated: Secondary | ICD-10-CM

## 2023-09-25 DIAGNOSIS — Z87891 Personal history of nicotine dependence: Secondary | ICD-10-CM

## 2023-09-25 DIAGNOSIS — Z122 Encounter for screening for malignant neoplasm of respiratory organs: Secondary | ICD-10-CM

## 2023-09-25 NOTE — Telephone Encounter (Signed)
.  Lung Cancer Screening Narrative/Criteria Questionnaire (Cigarette Smokers Only- No Cigars/Pipes/vapes)   Cassandra [name]     SDMV:10/02/2023 with Josette  [birth year]              LDCT: 10/04/2023 @ 10:30 at [facility]    68 y.o.   Phone: [phone]  Lung Screening Narrative   Before calling, confirm age (50-77 yrs Medicare / 50-80 yrs Private pay insurance)  Insurance information: [employer]   I am calling at the request of Dr. Zollie (referring provider) to schedule you for a lung screening.  Did your provider discuss this with you? Yes   This screening involves an initial meeting with our NP, who is the Nurse Navigator for the program.  It is called a shared decision making visit.  The initial meeting is required by insurance and Medicare to make sure you understand the program.  This appointment takes about 20 minutes to complete.  After you have spoken with the provider, we will schedule you for your screening scan.  This scan takes about 5-10 minutes to complete.  You can eat and drink normally before and after the scan.    I am going to ask you a few questions to make sure you meet the criteria to participate in the program.    Are you a current or former smoker? Current Age began smoking: 29  Quit 3 years   If you are a former smoker, what year did you quit smoking?  (must be within 15 yrs)    To calculate your smoking history, I need an accurate estimate of how many packs of cigarettes you smoked per day and   for how many years.    Years smoking 47 x Packs per day 2 = Pack years 65   (at least 20 pack yrs)   (Make sure they understand that we need to know how much they have smoked in the past, not just the number of PPD they are smoking now)  Do you have a personal history of cancer?  Yes - (type and when diagnosed - 5 yrs cancer free) skin    Do you have a family history of cancer? Yes  (cancer type and and relative) Sister brain, grandmother-liver cancer possible  unsure if it was liver  disease or cancer. Cousins-colon.    Are you having any of the following symptoms?  Coughing up blood?  No  Weight loss of 15 lbs or more in the last 6 months without trying / you cannot explain  No  It looks like you meet all criteria.  When would be a good time for us  to schedule you for this screening?   Additional information:

## 2023-10-02 ENCOUNTER — Ambulatory Visit (INDEPENDENT_AMBULATORY_CARE_PROVIDER_SITE_OTHER): Payer: Medicare Other | Admitting: Acute Care

## 2023-10-02 DIAGNOSIS — Z87891 Personal history of nicotine dependence: Secondary | ICD-10-CM | POA: Diagnosis not present

## 2023-10-02 NOTE — Patient Instructions (Signed)

## 2023-10-02 NOTE — Progress Notes (Signed)
 Provider Attestation I agree with the documentation of the Shared Decision Making visit,  smoking cessation counseling if appropriate, and verification or eligibility for lung cancer screening as documented by the RN Nurse Navigator.   Raejean Bullock, MSN, AGACNP-BC [facility] See Amion for personal pager PCCM on call pager [phone]     Virtual Visit via Telephone Note  I connected with Cassandra [name] on 10/02/23 at  9:30 AM EST by telephone and verified that I am speaking with the correct person using two identifiers.  Location: Patient: in home Provider: [address], [place], [place], [address]  Shared Decision Making Visit Lung Cancer Screening Program [phone])   Eligibility: Age 68 y.o. Pack Years Smoking History Calculation 94 (# packs/per year x # years smoked) Recent History of coughing up blood  no Unexplained weight loss? no ( >Than 15 pounds within the last 6 months ) Prior History Lung / other cancer no (Diagnosis within the last 5 years already requiring surveillance chest CT Scans). Smoking Status Former Smoker Former Smokers: Years since quit: 8 years  Quit Date: 06-01-15  Visit Components: Discussion included one or more decision making aids. yes Discussion included risk/benefits of screening. yes Discussion included potential follow up diagnostic testing for abnormal scans. yes Discussion included meaning and risk of over diagnosis. yes Discussion included meaning and risk of False Positives. yes Discussion included meaning of total radiation exposure. yes  Counseling Included: Importance of adherence to annual lung cancer LDCT screening. yes Impact of comorbidities on ability to participate in the program. yes Ability and willingness to under diagnostic treatment. yes  Smoking Cessation Counseling: Current Smokers:  Discussed importance of smoking cessation. yes Information about tobacco cessation classes and  interventions provided to patient. yes Patient provided with "ticket" for LDCT Scan. yes Symptomatic Patient. no  Counseling NA Diagnosis Code: Tobacco Use Z72.0 Asymptomatic Patient yes  Counseling (Intermediate counseling: > three minutes counseling) W2956 Former Smokers:  Discussed the importance of maintaining cigarette abstinence. yes Diagnosis Code: Personal History of Nicotine Dependence. O13.086 Information about tobacco cessation classes and interventions provided to patient. Yes Patient provided with "ticket" for LDCT Scan. yes Written Order for Lung Cancer Screening with LDCT placed in Epic. Yes (CT Chest Lung Cancer Screening Low Dose W/O CM) [id number] Z12.2-Screening of respiratory organs Z87.891-Personal history of nicotine dependence   Valentin Gaskins, RN 10/02/23

## 2023-10-04 ENCOUNTER — Ambulatory Visit (HOSPITAL_COMMUNITY)
Admission: RE | Admit: 2023-10-04 | Discharge: 2023-10-04 | Disposition: A | Discharge status: Home / Self Care | Payer: Medicare Other | Source: Ambulatory Visit | Attending: Family Medicine | Admitting: Family Medicine

## 2023-10-04 DIAGNOSIS — Z87891 Personal history of nicotine dependence: Secondary | ICD-10-CM | POA: Diagnosis not present

## 2023-10-04 DIAGNOSIS — F17210 Nicotine dependence, cigarettes, uncomplicated: Secondary | ICD-10-CM | POA: Insufficient documentation

## 2023-10-04 DIAGNOSIS — Z122 Encounter for screening for malignant neoplasm of respiratory organs: Secondary | ICD-10-CM | POA: Diagnosis not present

## 2023-10-11 ENCOUNTER — Other Ambulatory Visit: Payer: Self-pay | Admitting: Acute Care

## 2023-10-11 DIAGNOSIS — Z122 Encounter for screening for malignant neoplasm of respiratory organs: Secondary | ICD-10-CM

## 2023-10-11 DIAGNOSIS — Z87891 Personal history of nicotine dependence: Secondary | ICD-10-CM

## 2023-10-23 ENCOUNTER — Ambulatory Visit: Payer: Medicare Other

## 2023-10-23 VITALS — Ht 66.0 in | Wt 156.0 lb

## 2023-10-23 DIAGNOSIS — Z0000 Encounter for general adult medical examination without abnormal findings: Secondary | ICD-10-CM

## 2023-10-23 NOTE — Patient Instructions (Addendum)
 Ms. Cassandra [name] , Thank you for taking time to come for your Medicare Wellness Visit. I appreciate your ongoing commitment to your health goals. Please review the following plan we discussed and let me know if I can assist you in the future.   Referrals/Orders/Follow-Ups/Clinician Recommendations: Aim for 30 minutes of exercise or brisk walking, 6-8 glasses of water, and 5 servings of fruits and vegetables each day.  This is a list of the screening recommended for you and due dates:  Health Maintenance  Topic Date Due   COVID-19 Vaccine (3 - 2024-25 season) 05/14/2023   Zoster (Shingles) Vaccine (1 of 2) 11/14/2023*   Flu Shot  12/11/2023*   Pneumonia Vaccine (1 of 2 - PCV) 09/10/2024*   Screening for Lung Cancer  10/03/2024   Medicare Annual Wellness Visit  10/22/2024   Mammogram  11/15/2024   Colon Cancer Screening  08/04/2025   DEXA scan (bone density measurement)  Completed   Hepatitis C Screening  Completed   HPV Vaccine  Aged Out   DTaP/Tdap/Td vaccine  Discontinued  *Topic was postponed. The date shown is not the original due date.    Advanced directives: (ACP Link)Information on Advanced Care Planning can be found at [place]  Secretary of [facility] Advance Health Care Directives Advance Health Care Directives ([email or web address]   Next Medicare Annual Wellness Visit scheduled for next year: Yes

## 2023-10-23 NOTE — Progress Notes (Signed)
 Subjective:   Cassandra [name] is a 68 y.o. female who presents for Medicare Annual (Subsequent) preventive examination.  Visit Complete: Virtual I connected with  Cassandra [name] on 10/23/23 by a audio enabled telemedicine application and verified that I am speaking with the correct person using two identifiers.  Patient Location: Home  Provider Location: Home Office  This patient declined Interactive audio and video telecommunications. Therefore the visit was completed with audio only.  I discussed the limitations of evaluation and management by telemedicine. The patient expressed understanding and agreed to proceed.  Vital Signs: Because this visit was a virtual/telehealth visit, some criteria may be missing or patient reported. Any vitals not documented were not able to be obtained and vitals that have been documented are patient reported.  Cardiac Risk Factors include: advanced age (>24men, >23 women);smoking/ tobacco exposure     Objective:    Today's Vitals   10/23/23 1006  Weight: 156 lb (70.8 kg)  Height: 5\' 6"  (1.676 m)   Body mass index is 25.18 kg/m.     10/23/2023   10:09 AM 10/17/2022   10:33 AM 05/21/2021    8:17 AM 04/08/2020   11:03 AM 05/17/2019    9:19 AM 04/03/2019   12:56 PM 12/20/2017   11:16 AM  Advanced Directives  Does Patient Have a Medical Advance Directive? No No No No No No No  Would patient like information on creating a medical advance directive? Yes (MAU/Ambulatory/Procedural Areas - Information given) No - Patient declined No - Patient declined  Yes (MAU/Ambulatory/Procedural Areas - Information given) No - Patient declined No - Patient declined    Current Medications (verified) Outpatient Encounter Medications as of 10/23/2023  Medication Sig   fluocinonide cream (LIDEX) 0.05 % Apply 1 Application topically 2 (two) times daily.   fluticasone (FLONASE) 50 MCG/ACT nasal spray Place 2 sprays into both nostrils daily.   meloxicam (MOBIC) 15 MG tablet  Take 15 mg by mouth daily.   No facility-administered encounter medications on file as of 10/23/2023.    Allergies (verified) Chantix [varenicline], Levofloxacin, Ciprofloxacin, Codeine, Cymbalta [duloxetine hcl], Livalo [pitavastatin], Metoprolol, Morphine and codeine, Oxycodone-aspirin, Oxycontin [oxycodone hcl], Propoxyphene n-acetaminophen, Remeron [mirtazapine], Trazodone and nefazodone, Zithromax [azithromycin], Other, Oxycodone-acetaminophen, and Septra [sulfamethoxazole-trimethoprim]   History: Past Medical History:  Diagnosis Date   Anxiety    Bowel habit changes    excessive gas /frequent BM's with change in shape with pus   Chronic left shoulder pain    frozen    Chronic pain    left upper shoulder ,neck and back   COPD (chronic obstructive pulmonary disease) (HCC)    Dr. Rudi Heap   Depression    Emphysema lung [facility])    Fecal incontinence    GERD (gastroesophageal reflux disease)    Hyperlipidemia    IBS (irritable bowel syndrome)    Iliotibial band syndrome of right side 09/20/2013   Maternal complication related to childbirth    Sepsis, staph   Meniere's disease    Nicotine addiction    2 packs per day    Orthostatic hypotension 06/12/2019   Skin cancer    Rt. leg, squamous cell   Syncope and collapse 06/12/2019   Tubular adenoma of colon 01/31/2000   Dr. Tinnie Gens Medoff   Weakness of pelvic floor    prolapse -  MRI from 2017   Past Surgical History:  Procedure Laterality Date   ABDOMINAL HYSTERECTOMY     age 7   ANAL RECTAL MANOMETRY N/A  12/09/2015   Procedure: ANO RECTAL MANOMETRY;  Surgeon: Napoleon Form, MD;  Location: [facility];  Service: Endoscopy;  Laterality: N/A;   BLADDER REPAIR     sling/with perforation   BREAST BIOPSY Right 12/12/2022   MM RT BREAST BX W LOC DEV 1ST LESION IMAGE BX SPEC STEREO GUIDE 12/12/2022 [facility]   CESAREAN SECTION     1 time   KNEE ARTHROSCOPY Left    ruptured uterus     SHOULDER ARTHROSCOPY      multiple   Family History  Problem Relation Age of Onset   Coronary artery disease Mother    Diabetes Mother        borderline   Kidney disease Mother        stage 4   Coronary artery disease Father    Prostate cancer Father    Lung disease Father        black lung   Polymyalgia rheumatica Sister    Heart disease Sister    Colon polyps Sister    Liver cancer Maternal Grandmother    Liver disease Maternal Grandmother    Lung cancer Paternal Grandmother    Coronary artery disease Brother        heart attack   COPD Brother    Early death Son    Colon cancer Other        Both sides   Breast cancer Neg Hx    Social History   Socioeconomic History   Marital status: Divorced    Spouse name: Not on file   Number of children: 1   Years of education: Not on file   Highest education level: GED or equivalent  Occupational History   Occupation: Homemaker  Tobacco Use   Smoking status: Former    Current packs/day: 0.00    Average packs/day: 1 pack/day for 42.0 years (42.0 ttl pk-yrs)    Types: Cigarettes    Start date: 05/31/1973    Quit date: 06/01/2015    Years since quitting: 8.4   Smokeless tobacco: Never  Vaping Use   Vaping status: Never Used  Substance and Sexual Activity   Alcohol use: No    Alcohol/week: 0.0 standard drinks of alcohol   Drug use: No   Sexual activity: Yes  Other Topics Concern   Not on file  Social History Narrative   Lives alone.  One child.  Two grands.     Right handed   Social Drivers of Health   Financial Resource Strain: Low Risk  (10/23/2023)   Overall Financial Resource Strain (CARDIA)    Difficulty of Paying Living Expenses: Not hard at all  Food Insecurity: No Food Insecurity (10/23/2023)   Hunger Vital Sign    Worried About Running Out of Food in the Last Year: Never true    Ran Out of Food in the Last Year: Never true  Transportation Needs: No Transportation Needs (10/23/2023)   PRAPARE - Administrator, Civil Service  (Medical): No    Lack of Transportation (Non-Medical): No  Physical Activity: Insufficiently Active (10/23/2023)   Exercise Vital Sign    Days of Exercise per Week: 3 days    Minutes of Exercise per Session: 30 min  Stress: No Stress Concern Present (10/23/2023)   [employer] of Occupational Health - Occupational Stress Questionnaire    Feeling of Stress : Not at all  Social Connections: Moderately Isolated (10/23/2023)   Social Connection and Isolation Panel [NHANES]    Frequency of  Communication with Friends and Family: More than three times a week    Frequency of Social Gatherings with Friends and Family: Three times a week    Attends Religious Services: More than 4 times per year    Active Member of Clubs or Organizations: No    Attends Banker Meetings: Never    Marital Status: Divorced    Tobacco Counseling Counseling given: Not Answered   Clinical Intake:  Pre-visit preparation completed: Yes  Pain : No/denies pain     Diabetes: No  How often do you need to have someone help you when you read instructions, pamphlets, or other written materials from your doctor or pharmacy?: 1 - Never  Interpreter Needed?: No  Information entered by :: Kandis Fantasia LPN   Activities of Daily Living    10/23/2023   10:09 AM  In your present state of health, do you have any difficulty performing the following activities:  Hearing? 0  Vision? 0  Difficulty concentrating or making decisions? 0  Walking or climbing stairs? 0  Dressing or bathing? 0  Doing errands, shopping? 0  Preparing Food and eating ? N  Using the Toilet? N  In the past six months, have you accidently leaked urine? N  Do you have problems with loss of bowel control? N  Managing your Medications? N  Managing your Finances? N  Housekeeping or managing your Housekeeping? N    Patient Care Team: Mechele Claude, MD as PCP - General (Family Medicine) Rollene Rotunda, MD as PCP - Cardiology  (Cardiology) Ranelle Oyster, MD as Consulting Physician (Physical Medicine and Rehabilitation)  Indicate any recent Medical Services you may have received from other than [facility] providers in the past year (date may be approximate).     Assessment:   This is a routine wellness examination for Gerilynn.  Hearing/Vision screen No results found.   Goals Addressed             This Visit's Progress    COMPLETED: AWV       05/17/2019 AWV Goal: Fall Prevention  Over the next year, patient will decrease their risk for falls by: Using assistive devices, such as a cane or walker, as needed Identifying fall risks within their home and correcting them by: Removing throw rugs Adding handrails to stairs or ramps Removing clutter and keeping a clear pathway throughout the home Increasing light, especially at night Adding shower handles/bars Raising toilet seat Identifying potential personal risk factors for falls: Medication side effects Incontinence/urgency Vestibular dysfunction Hearing loss Musculoskeletal disorders Neurological disorders Orthostatic hypotension        Depression Screen    10/23/2023   10:10 AM 09/11/2023    9:58 AM 08/16/2023    8:17 AM 08/16/2023    8:08 AM 02/14/2023    9:17 AM 02/14/2023    9:09 AM 10/17/2022   10:26 AM  PHQ 2/9 Scores  PHQ - 2 Score 0 0 3 0 1 0 0  PHQ- 9 Score   4  4      Fall Risk    10/23/2023   10:08 AM 09/11/2023    9:58 AM 09/11/2023    9:57 AM 08/16/2023    8:08 AM 02/14/2023    9:09 AM  Fall Risk   Falls in the past year? 0 0 0 0 0  Number falls in past yr: 0      Injury with Fall? 0      Risk for fall due to :  No Fall Risks      Follow up Falls prevention discussed;Education provided;Falls evaluation completed        MEDICARE RISK AT HOME: Medicare Risk at Home Any stairs in or around the home?: No If so, are there any without handrails?: No Home free of loose throw rugs in walkways, pet beds, electrical cords, etc?:  Yes Adequate lighting in your home to reduce risk of falls?: Yes Life alert?: No Use of a cane, walker or w/c?: No Grab bars in the bathroom?: Yes Shower chair or bench in shower?: No Elevated toilet seat or a handicapped toilet?: Yes  TIMED UP AND GO:  Was the test performed?  No    Cognitive Function:    12/20/2017   11:19 AM 04/27/2016    3:44 PM 04/27/2016    3:15 PM 01/13/2015    4:40 PM  MMSE - Mini Mental State Exam  Orientation to time 5 5 5 5   Orientation to Place 5 5 5 5   Registration 3 3 3 3   Attention/ Calculation 5 5 5 3   Recall 3 2 2 3   Language- name 2 objects 2 2 2 2   Language- repeat 1 1  1   Language- follow 3 step command 3 3  3   Language- read & follow direction 1 1  1   Write a sentence 1 1  1   Copy design 1 1  1   Total score 30 29  28         10/23/2023   10:09 AM 10/17/2022   10:33 AM 05/21/2021    8:18 AM 05/17/2019    9:26 AM  6CIT Screen  What Year? 0 points 0 points 0 points 0 points  What month? 0 points 0 points 0 points 0 points  What time? 0 points 0 points 0 points 0 points  Count back from 20 0 points 0 points 0 points 0 points  Months in reverse 0 points 0 points 0 points 0 points  Repeat phrase 0 points 0 points 0 points 0 points  Total Score 0 points 0 points 0 points 0 points    Immunizations Immunization History  Administered Date(s) Administered   Influenza Split 06/21/2010   Influenza, Seasonal, Injecte, Preservative Fre 06/25/2014   Influenza,inj,Quad PF,6+ Mos 07/09/2013, 06/09/2015, 06/27/2016, 06/20/2017, 06/20/2018   Moderna Sars-Covid-2 Vaccination 12/26/2019, 01/28/2020   Td 05/13/2008    TDAP status: Due, Education has been provided regarding the importance of this vaccine. Advised may receive this vaccine at local pharmacy or Health Dept. Aware to provide a copy of the vaccination record if obtained from local pharmacy or Health Dept. Verbalized acceptance and understanding.  Flu Vaccine status: Declined, Education has  been provided regarding the importance of this vaccine but patient still declined. Advised may receive this vaccine at local pharmacy or Health Dept. Aware to provide a copy of the vaccination record if obtained from local pharmacy or Health Dept. Verbalized acceptance and understanding.  Pneumococcal vaccine status: Declined,  Education has been provided regarding the importance of this vaccine but patient still declined. Advised may receive this vaccine at local pharmacy or Health Dept. Aware to provide a copy of the vaccination record if obtained from local pharmacy or Health Dept. Verbalized acceptance and understanding.   Covid-19 vaccine status: Declined, Education has been provided regarding the importance of this vaccine but patient still declined. Advised may receive this vaccine at local pharmacy or Health Dept.or vaccine clinic. Aware to provide a copy of the vaccination record if obtained  from local pharmacy or Health Dept. Verbalized acceptance and understanding.  Qualifies for Shingles Vaccine? Yes   Zostavax completed No   Shingrix Completed?: No.    Education has been provided regarding the importance of this vaccine. Patient has been advised to call insurance company to determine out of pocket expense if they have not yet received this vaccine. Advised may also receive vaccine at local pharmacy or Health Dept. Verbalized acceptance and understanding.  Screening Tests Health Maintenance  Topic Date Due   COVID-19 Vaccine (3 - 2024-25 season) 05/14/2023   Zoster Vaccines- Shingrix (1 of 2) 11/14/2023 (Originally 12/07/2005)   INFLUENZA VACCINE  12/11/2023 (Originally 04/13/2023)   Pneumonia Vaccine 49+ Years old (1 of 2 - PCV) 09/10/2024 (Originally 12/07/1961)   Lung Cancer Screening  10/03/2024   Medicare Annual Wellness (AWV)  10/22/2024   MAMMOGRAM  11/15/2024   Colonoscopy  08/04/2025   DEXA SCAN  Completed   Hepatitis C Screening  Completed   HPV VACCINES  Aged Out    DTaP/Tdap/Td  Discontinued    Health Maintenance  Health Maintenance Due  Topic Date Due   COVID-19 Vaccine (3 - 2024-25 season) 05/14/2023    Colorectal cancer screening: Type of screening: Colonoscopy. Completed 08/05/15. Repeat every 10 years  Mammogram status: Completed 11/16/22. Repeat every year  Bone Density status: Completed 04/27/16. Results reflect: Bone density results: NORMAL. Repeat every 5 years.  Lung Cancer Screening: (Low Dose CT Chest recommended if Age 55-80 years, 20 pack-year currently smoking OR have quit w/in 15years.) does qualify.   Lung Cancer Screening Referral: last 10/04/23  Additional Screening:  Hepatitis C Screening: does qualify; Completed 01/18/16  Vision Screening: Recommended annual ophthalmology exams for early detection of glaucoma and other disorders of the eye. Is the patient up to date with their annual eye exam?  No  Who is the provider or what is the name of the office in which the patient attends annual eye exams? none If pt is not established with a provider, would they like to be referred to a provider to establish care? No .   Dental Screening: Recommended annual dental exams for proper oral hygiene  Community Resource Referral / Chronic Care Management: CRR required this visit?  No   CCM required this visit?  No     Plan:     I have personally reviewed and noted the following in the patient's chart:   Medical and social history Use of alcohol, tobacco or illicit drugs  Current medications and supplements including opioid prescriptions. Patient is not currently taking opioid prescriptions. Functional ability and status Nutritional status Physical activity Advanced directives List of other physicians Hospitalizations, surgeries, and ER visits in previous 12 months Vitals Screenings to include cognitive, depression, and falls Referrals and appointments  In addition, I have reviewed and discussed with patient certain  preventive protocols, quality metrics, and best practice recommendations. A written personalized care plan for preventive services as well as general preventive health recommendations were provided to patient.     Kandis Fantasia [place], [place]   09/30/1476   After Visit Summary: (MyChart) Due to this being a telephonic visit, the after visit summary with patients personalized plan was offered to patient via MyChart   Nurse Notes: No concerns at this time

## 2023-11-28 ENCOUNTER — Encounter: Payer: Self-pay | Admitting: Family Medicine

## 2023-11-30 ENCOUNTER — Encounter: Payer: Self-pay | Admitting: Family Medicine

## 2023-11-30 ENCOUNTER — Ambulatory Visit: Admitting: Family Medicine

## 2023-11-30 VITALS — BP 168/72 | HR 66 | Temp 98.0°F | Ht 66.0 in | Wt 159.0 lb

## 2023-11-30 DIAGNOSIS — R0989 Other specified symptoms and signs involving the circulatory and respiratory systems: Secondary | ICD-10-CM | POA: Diagnosis not present

## 2023-11-30 DIAGNOSIS — J439 Emphysema, unspecified: Secondary | ICD-10-CM | POA: Diagnosis not present

## 2023-11-30 DIAGNOSIS — R030 Elevated blood-pressure reading, without diagnosis of hypertension: Secondary | ICD-10-CM | POA: Diagnosis not present

## 2023-11-30 DIAGNOSIS — R0981 Nasal congestion: Secondary | ICD-10-CM | POA: Diagnosis not present

## 2023-11-30 DIAGNOSIS — H9202 Otalgia, left ear: Secondary | ICD-10-CM

## 2023-11-30 MED ORDER — PREDNISONE 20 MG PO TABS: 40.0000 mg | ORAL_TABLET | Freq: Every day | ORAL | 0 refills | Status: AC

## 2023-11-30 NOTE — Patient Instructions (Signed)

## 2023-11-30 NOTE — Progress Notes (Signed)
 Subjective:  Patient ID: Cassandra [name], female    DOB: 1955/10/18, 68 y.o.   MRN: [id number]  Patient Care Team: Mechele Claude, MD as PCP - General (Family Medicine) Rollene Rotunda, MD as PCP - Cardiology (Cardiology) Ranelle Oyster, MD as Consulting Physician (Physical Medicine and Rehabilitation)   Chief Complaint:  Facial Swelling (Patient states left side sinus, ear fullness, swollen lymph nodes)  HPI: Cassandra [name] is a 68 y.o. female presenting on 11/30/2023 for Facial Swelling (Patient states left side sinus, ear fullness, swollen lymph nodes)  States that 2/28 she was very sick for several days. Believes that she had flu from her granddaughter who tested positive for Flu A. Patient did not test for flu. Tested negative for covid. She was having fevers and body aches. Fever has resolved. Reports that she still has pain in her ear, lymph nodes and left nasal sinus. Taking advil on occasion. Endorses cough nonproductive, congestion, rhinorrhea. Has history of emphysema. She is not currently taking medications for it. Denies SOB or chest tightness.   Elevated BP  States that she is normally low. She is not taking medications for it currently.   Relevant past medical, surgical, family, and social history reviewed and updated as indicated.  Allergies and medications reviewed and updated. Data reviewed: Chart in Epic.  Past Medical History:  Diagnosis Date   Anxiety    Bowel habit changes    excessive gas /frequent BM's with change in shape with pus   Chronic left shoulder pain    frozen    Chronic pain    left upper shoulder ,neck and back   COPD (chronic obstructive pulmonary disease) (HCC)    Dr. Rudi Heap   Depression    Emphysema lung [facility])    Fecal incontinence    GERD (gastroesophageal reflux disease)    Hyperlipidemia    IBS (irritable bowel syndrome)    Iliotibial band syndrome of right side 09/20/2013   Maternal complication related to childbirth     Sepsis, staph   Meniere's disease    Nicotine addiction    2 packs per day    Orthostatic hypotension 06/12/2019   Skin cancer    Rt. leg, squamous cell   Syncope and collapse 06/12/2019   Tubular adenoma of colon 01/31/2000   Dr. Tinnie Gens Medoff   Weakness of pelvic floor    prolapse -  MRI from 2017    Past Surgical History:  Procedure Laterality Date   ABDOMINAL HYSTERECTOMY     age 52   ANAL RECTAL MANOMETRY N/A 12/09/2015   Procedure: ANO RECTAL MANOMETRY;  Surgeon: Napoleon Form, MD;  Location: [facility];  Service: Endoscopy;  Laterality: N/A;   BLADDER REPAIR     sling/with perforation   BREAST BIOPSY Right 12/12/2022   MM RT BREAST BX W LOC DEV 1ST LESION IMAGE BX SPEC STEREO GUIDE 12/12/2022 [facility]   CESAREAN SECTION     1 time   KNEE ARTHROSCOPY Left    ruptured uterus     SHOULDER ARTHROSCOPY     multiple   Social History   Socioeconomic History   Marital status: Divorced    Spouse name: Not on file   Number of children: 1   Years of education: Not on file   Highest education level: GED or equivalent  Occupational History   Occupation: Homemaker  Tobacco Use   Smoking status: Former    Current packs/day: 0.00    Average  packs/day: 1 pack/day for 42.0 years (42.0 ttl pk-yrs)    Types: Cigarettes    Start date: 05/31/1973    Quit date: 06/01/2015    Years since quitting: 8.5   Smokeless tobacco: Never  Vaping Use   Vaping status: Never Used  Substance and Sexual Activity   Alcohol use: No    Alcohol/week: 0.0 standard drinks of alcohol   Drug use: No   Sexual activity: Yes  Other Topics Concern   Not on file  Social History Narrative   Lives alone.  One child.  Two grands.     Right handed   Social Drivers of Health   Financial Resource Strain: Low Risk  (10/23/2023)   Overall Financial Resource Strain (CARDIA)    Difficulty of Paying Living Expenses: Not hard at all  Food Insecurity: No Food Insecurity (10/23/2023)   Hunger  Vital Sign    Worried About Running Out of Food in the Last Year: Never true    Ran Out of Food in the Last Year: Never true  Transportation Needs: No Transportation Needs (10/23/2023)   PRAPARE - Administrator, Civil Service (Medical): No    Lack of Transportation (Non-Medical): No  Physical Activity: Insufficiently Active (10/23/2023)   Exercise Vital Sign    Days of Exercise per Week: 3 days    Minutes of Exercise per Session: 30 min  Stress: No Stress Concern Present (10/23/2023)   [employer] of Occupational Health - Occupational Stress Questionnaire    Feeling of Stress : Not at all  Social Connections: Moderately Isolated (10/23/2023)   Social Connection and Isolation Panel [NHANES]    Frequency of Communication with Friends and Family: More than three times a week    Frequency of Social Gatherings with Friends and Family: Three times a week    Attends Religious Services: More than 4 times per year    Active Member of Clubs or Organizations: No    Attends Banker Meetings: Never    Marital Status: Divorced  Catering manager Violence: Not At Risk (10/23/2023)   Humiliation, Afraid, Rape, and Kick questionnaire    Fear of Current or Ex-Partner: No    Emotionally Abused: No    Physically Abused: No    Sexually Abused: No    Outpatient Encounter Medications as of 11/30/2023  Medication Sig   fluocinonide cream (LIDEX) 0.05 % Apply 1 Application topically 2 (two) times daily.   fluticasone (FLONASE) 50 MCG/ACT nasal spray Place 2 sprays into both nostrils daily.   meloxicam (MOBIC) 15 MG tablet Take 15 mg by mouth daily.   No facility-administered encounter medications on file as of 11/30/2023.    Allergies  Allergen Reactions   Chantix [Varenicline] Rash   Levofloxacin Other (See Comments)    Pain all over   Ciprofloxacin    Codeine Itching and Nausea And Vomiting   Cymbalta [Duloxetine Hcl] Nausea Only   Livalo [Pitavastatin] Other (See  Comments)    Seizures    Metoprolol Other (See Comments)    Had episode of possible hypotension or seizure described and muscles clamping down and searing pain toe to head and near fall x2. Loss of bowel control. No further symptoms after stopped.   Morphine And Codeine Hives    Itching hive rash   Oxycodone-Aspirin Nausea And Vomiting   Oxycontin [Oxycodone Hcl] Other (See Comments)    Altered mental status   Propoxyphene N-Acetaminophen Nausea And Vomiting   Remeron [Mirtazapine]  Shakes/tremors   Trazodone And Nefazodone Other (See Comments)    Medication is too strong   Zithromax [Azithromycin] Other (See Comments)    Unknown    Other Rash    Lipturzet caused enlarged veins in legs   Oxycodone-Acetaminophen Hives, Nausea And Vomiting and Rash   Septra [Sulfamethoxazole-Trimethoprim] Rash    Review of Systems As per HPI  Objective:  BP (!) 168/72   Pulse 66   Temp 98 F (36.7 C)   Ht 5\' 6"  (1.676 m)   Wt 159 lb (72.1 kg)   SpO2 99%   BMI 25.66 kg/m    Wt Readings from Last 3 Encounters:  11/30/23 159 lb (72.1 kg)  10/23/23 156 lb (70.8 kg)  09/11/23 156 lb 12.8 oz (71.1 kg)    Physical Exam Constitutional:      General: She is awake. She is not in acute distress.    Appearance: Normal appearance. She is well-developed and well-groomed. She is not ill-appearing, toxic-appearing or diaphoretic.  HENT:     Right Ear: No laceration, drainage, swelling or tenderness. No middle ear effusion. There is no impacted cerumen. No foreign body. No mastoid tenderness. No PE tube. No hemotympanum. Tympanic membrane is not injected, scarred, perforated, erythematous, retracted or bulging.     Left Ear: Tenderness present. No laceration, drainage or swelling. A middle ear effusion is present. There is no impacted cerumen. No foreign body. No mastoid tenderness. No PE tube. No hemotympanum. Tympanic membrane is injected. Tympanic membrane is not scarred, perforated, erythematous,  retracted or bulging.     Nose: Congestion and rhinorrhea present. Rhinorrhea is clear.     Right Sinus: Maxillary sinus tenderness and frontal sinus tenderness present.     Left Sinus: Maxillary sinus tenderness and frontal sinus tenderness present.     Mouth/Throat:     Lips: Pink. No lesions.     Mouth: Mucous membranes are moist.     Pharynx: Posterior oropharyngeal erythema present. No pharyngeal swelling or postnasal drip.  Cardiovascular:     Rate and Rhythm: Normal rate and regular rhythm.     Pulses: Normal pulses.          Radial pulses are 2+ on the right side and 2+ on the left side.       Posterior tibial pulses are 2+ on the right side and 2+ on the left side.     Heart sounds: Normal heart sounds. No murmur heard.    No gallop.  Pulmonary:     Effort: Pulmonary effort is normal. No respiratory distress.     Breath sounds: Normal breath sounds. No stridor. No wheezing, rhonchi or rales.  Musculoskeletal:     Cervical back: Full passive range of motion without pain and neck supple.     Right lower leg: No edema.     Left lower leg: No edema.  Skin:    General: Skin is warm.     Capillary Refill: Capillary refill takes less than 2 seconds.  Neurological:     General: No focal deficit present.     Mental Status: She is alert, oriented to person, place, and time and easily aroused. Mental status is at baseline.     GCS: GCS eye subscore is 4. GCS verbal subscore is 5. GCS motor subscore is 6.     Motor: No weakness.  Psychiatric:        Attention and Perception: Attention and perception normal.        Mood and  Affect: Mood and affect normal.        Speech: Speech normal.        Behavior: Behavior normal. Behavior is cooperative.        Thought Content: Thought content normal. Thought content does not include homicidal or suicidal ideation. Thought content does not include homicidal or suicidal plan.        Cognition and Memory: Cognition and memory normal.         Judgment: Judgment normal.     Results for orders placed or performed during the hospital encounter of 09/14/23  Basic metabolic panel   Collection Time: 09/14/23  5:46 PM  Result Value Ref Range   Sodium 141 135 - 145 mmol/L   Potassium 3.4 (L) 3.5 - 5.1 mmol/L   Chloride 107 98 - 111 mmol/L   CO2 25 22 - 32 mmol/L   Glucose, Bld 131 (H) 70 - 99 mg/dL   BUN 15 8 - 23 mg/dL   Creatinine, Ser 4.09 0.44 - 1.00 mg/dL   Calcium 8.6 (L) 8.9 - 10.3 mg/dL   GFR, Estimated >81 >19 mL/min   Anion gap 9 5 - 15  CBC   Collection Time: 09/14/23  5:46 PM  Result Value Ref Range   WBC 9.7 4.0 - 10.5 K/uL   RBC 4.67 3.87 - 5.11 MIL/uL   Hemoglobin 13.9 12.0 - 15.0 g/dL   HCT 14.7 82.9 - 56.2 %   MCV 90.6 80.0 - 100.0 fL   MCH 29.8 26.0 - 34.0 pg   MCHC 32.9 30.0 - 36.0 g/dL   RDW 13.0 86.5 - 78.4 %   Platelets 294 150 - 400 K/uL   nRBC 0.0 0.0 - 0.2 %  Troponin I (High Sensitivity)   Collection Time: 09/14/23  5:46 PM  Result Value Ref Range   Troponin I (High Sensitivity) 13 <18 ng/L       11/30/2023   12:11 PM 10/23/2023   10:10 AM 09/11/2023    9:58 AM 08/16/2023    8:17 AM 08/16/2023    8:08 AM  Depression screen PHQ 2/9  Decreased Interest 1 0 0 2 0  Down, Depressed, Hopeless 1 0 0 1 0  PHQ - 2 Score 2 0 0 3 0  Altered sleeping 1   0   Tired, decreased energy 1   1   Change in appetite 1   0   Feeling bad or failure about yourself  0   0   Trouble concentrating 0   0   Moving slowly or fidgety/restless 0   0   Suicidal thoughts 0   0   PHQ-9 Score 5   4   Difficult doing work/chores Not difficult at all   Not difficult at all        11/30/2023   12:12 PM 08/16/2023    8:17 AM 02/14/2023    9:17 AM 08/15/2022    9:31 AM  GAD 7 : Generalized Anxiety Score  Nervous, Anxious, on Edge 0 0 0 0  Control/stop worrying 0 0 0 0  Worry too much - different things 0 0 0 0  Trouble relaxing 0 1 0 0  Restless 0 0 0 0  Easily annoyed or irritable 0 0 0 0  Afraid - awful might  happen 0 0 0 0  Total GAD 7 Score 0 1 0 0  Anxiety Difficulty Not difficult at all Somewhat difficult Not difficult at all Not difficult at all  Pertinent labs & imaging results that were available during my care of the patient were reviewed by me and considered in my medical decision making.  Assessment & Plan:  Cassandra "[name]" was seen today for facial swelling.  Diagnoses and all orders for this visit: 1. Pulmonary emphysema, unspecified emphysema type (HCC) (Primary) Lung exam reassuring. Will start medication as below to treat for sinus congestion and to assist with exacerbation of COPD. Patient to follow up with PCP  - predniSONE (DELTASONE) 20 MG tablet; Take 2 tablets (40 mg total) by mouth daily with breakfast for 5 days.  Dispense: 10 tablet; Refill: 0  2. Nasal sinus congestion As above.  - predniSONE (DELTASONE) 20 MG tablet; Take 2 tablets (40 mg total) by mouth daily with breakfast for 5 days.  Dispense: 10 tablet; Refill: 0  3. Left ear pain As above.   4. Tenderness of lymph node As above.   5. Elevated blood pressure reading Elevated BP today in office. Discussed with patient monitoring BP at home. Instructed pt to take BP first thing in the morning after sitting for 5 minutes with feet flat on the floor, arm at heart level. Pt verbalized they were able to obtain BP cuff. Will review measurements with patient at follow up and determine plan for BP management.   Continue all other maintenance medications.  Follow up plan: Return if symptoms worsen or fail to improve, for has follow up with PCP .   Continue healthy lifestyle choices, including diet (rich in fruits, vegetables, and lean proteins, and low in salt and simple carbohydrates) and exercise (at least 30 minutes of moderate physical activity daily).  Written and verbal instructions provided   The above assessment and management plan was discussed with the patient. The patient verbalized understanding of and  has agreed to the management plan. Patient is aware to call the clinic if they develop any new symptoms or if symptoms persist or worsen. Patient is aware when to return to the clinic for a follow-up visit. Patient educated on when it is appropriate to go to the emergency department.   Neale Burly, DNP-FNP [facility] [address] [place], [place] [address] [phone]

## 2023-12-14 DIAGNOSIS — L820 Inflamed seborrheic keratosis: Secondary | ICD-10-CM | POA: Diagnosis not present

## 2023-12-21 ENCOUNTER — Encounter: Payer: Self-pay | Admitting: Family Medicine

## 2023-12-21 ENCOUNTER — Ambulatory Visit: Admitting: Family Medicine

## 2023-12-21 ENCOUNTER — Ambulatory Visit: Payer: Self-pay

## 2023-12-21 VITALS — BP 192/90 | HR 93 | Temp 97.0°F | Ht 66.0 in | Wt 155.2 lb

## 2023-12-21 DIAGNOSIS — J069 Acute upper respiratory infection, unspecified: Secondary | ICD-10-CM | POA: Diagnosis not present

## 2023-12-21 MED ORDER — AMOXICILLIN-POT CLAVULANATE 875-125 MG PO TABS: 1.0000 | ORAL_TABLET | Freq: Two times a day (BID) | ORAL | 0 refills | Status: DC

## 2023-12-21 MED ORDER — PREDNISONE 20 MG PO TABS: 40.0000 mg | ORAL_TABLET | Freq: Every day | ORAL | 0 refills | Status: AC

## 2023-12-21 MED ORDER — ALBUTEROL SULFATE HFA 108 (90 BASE) MCG/ACT IN AERS: 2.0000 | INHALATION_SPRAY | Freq: Four times a day (QID) | RESPIRATORY_TRACT | 0 refills | Status: DC | PRN

## 2023-12-21 NOTE — Progress Notes (Signed)
 Subjective:  Patient ID: Cassandra [name], female    DOB: 02/01/1956, 68 y.o.   MRN: [id number]  Patient Care Team: Mechele Claude, MD as PCP - General (Family Medicine) Rollene Rotunda, MD as PCP - Cardiology (Cardiology) Ranelle Oyster, MD as Consulting Physician (Physical Medicine and Rehabilitation)   Chief Complaint:  Cough and Nasal Congestion (X 6 weeks/)   HPI: Cassandra [name] is a 68 y.o. female presenting on 12/21/2023 for Cough and Nasal Congestion (X 6 weeks/)   History of Present Illness   Cassandra [name] "Cassandra [name]" is a 68 year old female with emphysema who presents with persistent respiratory symptoms. She is accompanied by her husband.  She has been experiencing persistent respiratory symptoms for approximately six weeks. Initially, she was treated with prednisone, which provided some relief, but her symptoms have not resolved. Her symptoms include a lot of coughing, feeling generally unwell, and difficulty breathing. She believes the infection is in her sinuses and possibly in her lungs.  She reports coughing up yellow sputum and having yellow nasal discharge. She also experienced a low-grade fever, ear pain, and generalized discomfort, including gum pain. She mentions that she and her husband both had a flu-like illness after visiting her niece, who was hospitalized for five days. During that time, she was bedridden and experienced bowel incontinence. She did not seek hospital care, hoping for improvement, which did not occur.  She has been using fexofenadine, nasal saline spray, vitamin B12, and vitamin D to manage her symptoms. She has not used Mucinex due to her emphysema. She smokes about five to six cigarettes a day and is currently on [place]. She does not use inhalers regularly as her emphysema is not severe enough to require them, but she has several old inhalers at home.          Relevant past medical, surgical, family, and social history reviewed and updated as  indicated.  Allergies and medications reviewed and updated. Data reviewed: Chart in Epic.   Past Medical History:  Diagnosis Date   Anxiety    Bowel habit changes    excessive gas /frequent BM's with change in shape with pus   Chronic left shoulder pain    frozen    Chronic pain    left upper shoulder ,neck and back   COPD (chronic obstructive pulmonary disease) (HCC)    Dr. Rudi Heap   Depression    Emphysema lung [facility])    Fecal incontinence    GERD (gastroesophageal reflux disease)    Hyperlipidemia    IBS (irritable bowel syndrome)    Iliotibial band syndrome of right side 09/20/2013   Maternal complication related to childbirth    Sepsis, staph   Meniere's disease    Nicotine addiction    2 packs per day    Orthostatic hypotension 06/12/2019   Skin cancer    Rt. leg, squamous cell   Syncope and collapse 06/12/2019   Tubular adenoma of colon 01/31/2000   Dr. Tinnie Gens Medoff   Weakness of pelvic floor    prolapse -  MRI from 2017    Past Surgical History:  Procedure Laterality Date   ABDOMINAL HYSTERECTOMY     age 89   ANAL RECTAL MANOMETRY N/A 12/09/2015   Procedure: ANO RECTAL MANOMETRY;  Surgeon: Napoleon Form, MD;  Location: [facility];  Service: Endoscopy;  Laterality: N/A;   BLADDER REPAIR     sling/with perforation   BREAST BIOPSY Right 12/12/2022  MM RT BREAST BX W LOC DEV 1ST LESION IMAGE BX SPEC STEREO GUIDE 12/12/2022 [facility]   CESAREAN SECTION     1 time   KNEE ARTHROSCOPY Left    ruptured uterus     SHOULDER ARTHROSCOPY     multiple    Social History   Socioeconomic History   Marital status: Divorced    Spouse name: Not on file   Number of children: 1   Years of education: Not on file   Highest education level: GED or equivalent  Occupational History   Occupation: Homemaker  Tobacco Use   Smoking status: Former    Current packs/day: 0.00    Average packs/day: 1 pack/day for 42.0 years (42.0 ttl pk-yrs)    Types:  Cigarettes    Start date: 05/31/1973    Quit date: 06/01/2015    Years since quitting: 8.5   Smokeless tobacco: Never  Vaping Use   Vaping status: Never Used  Substance and Sexual Activity   Alcohol use: No    Alcohol/week: 0.0 standard drinks of alcohol   Drug use: No   Sexual activity: Yes  Other Topics Concern   Not on file  Social History Narrative   Lives alone.  One child.  Two grands.     Right handed   Social Drivers of Health   Financial Resource Strain: Low Risk  (10/23/2023)   Overall Financial Resource Strain (CARDIA)    Difficulty of Paying Living Expenses: Not hard at all  Food Insecurity: No Food Insecurity (10/23/2023)   Hunger Vital Sign    Worried About Running Out of Food in the Last Year: Never true    Ran Out of Food in the Last Year: Never true  Transportation Needs: No Transportation Needs (10/23/2023)   PRAPARE - Administrator, Civil Service (Medical): No    Lack of Transportation (Non-Medical): No  Physical Activity: Insufficiently Active (10/23/2023)   Exercise Vital Sign    Days of Exercise per Week: 3 days    Minutes of Exercise per Session: 30 min  Stress: No Stress Concern Present (10/23/2023)   [employer] of Occupational Health - Occupational Stress Questionnaire    Feeling of Stress : Not at all  Social Connections: Moderately Isolated (10/23/2023)   Social Connection and Isolation Panel [NHANES]    Frequency of Communication with Friends and Family: More than three times a week    Frequency of Social Gatherings with Friends and Family: Three times a week    Attends Religious Services: More than 4 times per year    Active Member of Clubs or Organizations: No    Attends Banker Meetings: Never    Marital Status: Divorced  Catering manager Violence: Not At Risk (10/23/2023)   Humiliation, Afraid, Rape, and Kick questionnaire    Fear of Current or Ex-Partner: No    Emotionally Abused: No    Physically Abused: No     Sexually Abused: No    Outpatient Encounter Medications as of 12/21/2023  Medication Sig   albuterol (VENTOLIN HFA) 108 (90 Base) MCG/ACT inhaler Inhale 2 puffs into the lungs every 6 (six) hours as needed for wheezing or shortness of breath.   amoxicillin-clavulanate (AUGMENTIN) 875-125 MG tablet Take 1 tablet by mouth 2 (two) times daily.   fluocinonide cream (LIDEX) 0.05 % Apply 1 Application topically 2 (two) times daily.   fluticasone (FLONASE) 50 MCG/ACT nasal spray Place 2 sprays into both nostrils daily.   meloxicam (MOBIC)  15 MG tablet Take 15 mg by mouth daily.   predniSONE (DELTASONE) 20 MG tablet Take 2 tablets (40 mg total) by mouth daily with breakfast for 5 days.   No facility-administered encounter medications on file as of 12/21/2023.    Allergies  Allergen Reactions   Chantix [Varenicline] Rash   Levofloxacin Other (See Comments)    Pain all over   Ciprofloxacin    Codeine Itching and Nausea And Vomiting   Cymbalta [Duloxetine Hcl] Nausea Only   Livalo [Pitavastatin] Other (See Comments)    Seizures    Metoprolol Other (See Comments)    Had episode of possible hypotension or seizure described and muscles clamping down and searing pain toe to head and near fall x2. Loss of bowel control. No further symptoms after stopped.   Morphine And Codeine Hives    Itching hive rash   Oxycodone-Aspirin Nausea And Vomiting   Oxycontin [Oxycodone Hcl] Other (See Comments)    Altered mental status   Propoxyphene N-Acetaminophen Nausea And Vomiting   Remeron [Mirtazapine]     Shakes/tremors   Trazodone And Nefazodone Other (See Comments)    Medication is too strong   Zithromax [Azithromycin] Other (See Comments)    Unknown    Other Rash    Lipturzet caused enlarged veins in legs   Oxycodone-Acetaminophen Hives, Nausea And Vomiting and Rash   Septra [Sulfamethoxazole-Trimethoprim] Rash    Pertinent ROS per HPI, otherwise unremarkable      Objective:  BP (!) 192/90    Pulse 93   Temp (!) 97 F (36.1 C)   Ht 5\' 6"  (1.676 m)   Wt 155 lb 3.2 oz (70.4 kg)   SpO2 95%   BMI 25.05 kg/m    Wt Readings from Last 3 Encounters:  12/21/23 155 lb 3.2 oz (70.4 kg)  11/30/23 159 lb (72.1 kg)  10/23/23 156 lb (70.8 kg)    Physical Exam Vitals and nursing note reviewed.  Constitutional:      General: She is not in acute distress.    Appearance: Normal appearance. She is normal weight. She is ill-appearing. She is not toxic-appearing or diaphoretic.  HENT:     Head: Normocephalic and atraumatic.     Right Ear: Ear canal and external ear normal. A middle ear effusion is present.     Left Ear: Ear canal and external ear normal. A middle ear effusion is present.     Nose: Congestion present.     Right Turbinates: Enlarged.     Left Turbinates: Enlarged.     Right Sinus: Frontal sinus tenderness present. No maxillary sinus tenderness.     Left Sinus: Frontal sinus tenderness present. No maxillary sinus tenderness.     Mouth/Throat:     Lips: Pink.     Mouth: Mucous membranes are moist.     Pharynx: Oropharynx is clear. Posterior oropharyngeal erythema and postnasal drip present. No pharyngeal swelling, oropharyngeal exudate or uvula swelling.  Eyes:     Conjunctiva/sclera: Conjunctivae normal.     Pupils: Pupils are equal, round, and reactive to light.  Cardiovascular:     Rate and Rhythm: Normal rate and regular rhythm.     Heart sounds: Normal heart sounds.  Pulmonary:     Effort: Pulmonary effort is normal.     Breath sounds: Wheezing present.  Musculoskeletal:     Cervical back: Neck supple.     Right lower leg: No edema.     Left lower leg: No edema.  Lymphadenopathy:  Cervical: No cervical adenopathy.  Skin:    General: Skin is warm and dry.     Capillary Refill: Capillary refill takes less than 2 seconds.  Neurological:     General: No focal deficit present.     Mental Status: She is alert and oriented to person, place, and time.   Psychiatric:        Mood and Affect: Mood normal.        Behavior: Behavior normal.        Thought Content: Thought content normal.        Judgment: Judgment normal.       Results for orders placed or performed during the hospital encounter of 09/14/23  Basic metabolic panel   Collection Time: 09/14/23  5:46 PM  Result Value Ref Range   Sodium 141 135 - 145 mmol/L   Potassium 3.4 (L) 3.5 - 5.1 mmol/L   Chloride 107 98 - 111 mmol/L   CO2 25 22 - 32 mmol/L   Glucose, Bld 131 (H) 70 - 99 mg/dL   BUN 15 8 - 23 mg/dL   Creatinine, Ser 6.04 0.44 - 1.00 mg/dL   Calcium 8.6 (L) 8.9 - 10.3 mg/dL   GFR, Estimated >54 >09 mL/min   Anion gap 9 5 - 15  CBC   Collection Time: 09/14/23  5:46 PM  Result Value Ref Range   WBC 9.7 4.0 - 10.5 K/uL   RBC 4.67 3.87 - 5.11 MIL/uL   Hemoglobin 13.9 12.0 - 15.0 g/dL   HCT 81.1 91.4 - 78.2 %   MCV 90.6 80.0 - 100.0 fL   MCH 29.8 26.0 - 34.0 pg   MCHC 32.9 30.0 - 36.0 g/dL   RDW 95.6 21.3 - 08.6 %   Platelets 294 150 - 400 K/uL   nRBC 0.0 0.0 - 0.2 %  Troponin I (High Sensitivity)   Collection Time: 09/14/23  5:46 PM  Result Value Ref Range   Troponin I (High Sensitivity) 13 <18 ng/L       Pertinent labs & imaging results that were available during my care of the patient were reviewed by me and considered in my medical decision making.  Assessment & Plan:  Cassandra [name] "Cassandra [name]" was seen today for cough and nasal congestion.  Diagnoses and all orders for this visit:  URI with cough and congestion -     predniSONE (DELTASONE) 20 MG tablet; Take 2 tablets (40 mg total) by mouth daily with breakfast for 5 days. -     amoxicillin-clavulanate (AUGMENTIN) 875-125 MG tablet; Take 1 tablet by mouth 2 (two) times daily. -     albuterol (VENTOLIN HFA) 108 (90 Base) MCG/ACT inhaler; Inhale 2 puffs into the lungs every 6 (six) hours as needed for wheezing or shortness of breath.       Sinusitis with possible lower respiratory tract infection Chronic  sinusitis with symptoms persisting for six weeks, including yellow nasal discharge, cough, and wheezing. Possible lower respiratory tract involvement suggested by cough and wheezing. Previous prednisone treatment provided symptomatic relief but did not resolve the infection. Likely bacterial infection given the duration and nature of symptoms. Emphysema complicates respiratory symptoms. Augmentin chosen for its broad coverage to address the likely bacterial infection. Prednisone prescribed to reduce inflammation and open airways. Albuterol inhaler prescribed for symptomatic relief of cough and wheezing. - Prescribe Augmentin (amoxicillin/clavulanate) twice daily for 10 days. - Prescribe prednisone daily for 5 days. - Prescribe albuterol inhaler for symptomatic relief of cough and wheezing. -  Advise continuation of current home treatments, including plain Mucinex (guaifenesin) with adequate hydration. - Instruct to report if symptoms do not improve.  Emphysema Chronic emphysema with current exacerbation of respiratory symptoms. Smokes approximately five to six cigarettes per day. No current use of inhalers, but albuterol prescribed for acute symptoms. Emphasized the importance of smoking cessation to prevent further exacerbation. - Prescribe albuterol inhaler for acute symptoms. - Advise smoking cessation to prevent further exacerbation.          Continue all other maintenance medications.  Follow up plan: Return if symptoms worsen or fail to improve.   Continue healthy lifestyle choices, including diet (rich in fruits, vegetables, and lean proteins, and low in salt and simple carbohydrates) and exercise (at least 30 minutes of moderate physical activity daily).  Educational handout given for URI  The above assessment and management plan was discussed with the patient. The patient verbalized understanding of and has agreed to the management plan. Patient is aware to call the clinic if they  develop any new symptoms or if symptoms persist or worsen. Patient is aware when to return to the clinic for a follow-up visit. Patient educated on when it is appropriate to go to the emergency department.   Kari Baars, FNP-C [facility] [phone]

## 2023-12-21 NOTE — Telephone Encounter (Signed)
 Copied from CRM [id number]. Topic: Clinical - Red Word Triage >> Dec 21, 2023  2:27 PM Izetta Dakin wrote: [facility] that prompted transfer to Nurse Triage: Increased cough, sob  Chief Complaint: sob, cough Symptoms: runny nose, cough, sob Frequency: comes and goes Pertinent Negatives: Patient denies cp, fever Disposition: [] ED /[] Urgent Care (no appt availability in office) / [x] Appointment(In office/virtual)/ []  [facility]/ [] Home Care/ [] Refused Recommended Disposition /[] [facility]/ []  Follow-up with PCP Additional Notes: per protocol apt made for today; care advice given, denies questions; instructed to go to ER if becomes worse.   Reason for Disposition  [1] MILD difficulty breathing (e.g., minimal/no SOB at rest, SOB with walking, pulse <100) AND [2] NEW-onset or WORSE than normal  Answer Assessment - Initial Assessment Questions 1. RESPIRATORY STATUS: "Describe your breathing?" (e.g., wheezing, shortness of breath, unable to speak, severe coughing)      Sob, coughing with yellow discharge 2. ONSET: "When did this breathing problem begin?"      6 weeks ago 3. PATTERN "Does the difficult breathing come and go, or has it been constant since it started?"      Come and goes 4. SEVERITY: "How bad is your breathing?" (e.g., mild, moderate, severe)    - MILD: No SOB at rest, mild SOB with walking, speaks normally in sentences, can lie down, no retractions, pulse < 100.    - MODERATE: SOB at rest, SOB with minimal exertion and prefers to sit, cannot lie down flat, speaks in phrases, mild retractions, audible wheezing, pulse 100-120.    - SEVERE: Very SOB at rest, speaks in single words, struggling to breathe, sitting hunched forward, retractions, pulse > 120      severe 5. RECURRENT SYMPTOM: "Have you had difficulty breathing before?" If Yes, ask: "When was the last time?" and "What happened that time?"      Hx emphysema 6. CARDIAC HISTORY: "Do you have any history of  heart disease?" (e.g., heart attack, angina, bypass surgery, angioplasty)      No  7. LUNG HISTORY: "Do you have any history of lung disease?"  (e.g., pulmonary embolus, asthma, emphysema)     emphysema 8. CAUSE: "What do you think is causing the breathing problem?"      unknown 9. OTHER SYMPTOMS: "Do you have any other symptoms? (e.g., dizziness, runny nose, cough, chest pain, fever)     Runny nose 10. O2 SATURATION MONITOR:  "Do you use an oxygen saturation monitor (pulse oximeter) at home?" If Yes, ask: "What is your reading (oxygen level) today?" "What is your usual oxygen saturation reading?" (e.g., 95%)       na 11. PREGNANCY: "Is there any chance you are pregnant?" "When was your last menstrual period?"       na 12. TRAVEL: "Have you traveled out of the country in the last month?" (e.g., travel history, exposures)       no  Protocols used: Breathing Difficulty-A-AH

## 2023-12-24 ENCOUNTER — Encounter: Payer: Self-pay | Admitting: Family Medicine

## 2024-01-09 ENCOUNTER — Ambulatory Visit: Admitting: Family Medicine

## 2024-01-10 ENCOUNTER — Telehealth: Payer: Self-pay

## 2024-01-10 ENCOUNTER — Ambulatory Visit (INDEPENDENT_AMBULATORY_CARE_PROVIDER_SITE_OTHER): Admitting: Family Medicine

## 2024-01-10 ENCOUNTER — Ambulatory Visit

## 2024-01-10 ENCOUNTER — Other Ambulatory Visit: Payer: Self-pay | Admitting: Family Medicine

## 2024-01-10 ENCOUNTER — Other Ambulatory Visit (HOSPITAL_COMMUNITY): Payer: Self-pay

## 2024-01-10 ENCOUNTER — Encounter: Payer: Self-pay | Admitting: Family Medicine

## 2024-01-10 ENCOUNTER — Ambulatory Visit
Admission: RE | Admit: 2024-01-10 | Discharge: 2024-01-10 | Disposition: A | Discharge status: Home / Self Care | Source: Ambulatory Visit | Attending: Family Medicine | Admitting: Family Medicine

## 2024-01-10 VITALS — BP 153/80 | HR 77 | Temp 97.3°F | Ht 66.0 in | Wt 152.0 lb

## 2024-01-10 DIAGNOSIS — Z1231 Encounter for screening mammogram for malignant neoplasm of breast: Secondary | ICD-10-CM | POA: Diagnosis not present

## 2024-01-10 DIAGNOSIS — G720 Drug-induced myopathy: Secondary | ICD-10-CM

## 2024-01-10 DIAGNOSIS — R051 Acute cough: Secondary | ICD-10-CM

## 2024-01-10 DIAGNOSIS — J441 Chronic obstructive pulmonary disease with (acute) exacerbation: Secondary | ICD-10-CM | POA: Diagnosis not present

## 2024-01-10 DIAGNOSIS — E782 Mixed hyperlipidemia: Secondary | ICD-10-CM

## 2024-01-10 DIAGNOSIS — R059 Cough, unspecified: Secondary | ICD-10-CM | POA: Diagnosis not present

## 2024-01-10 DIAGNOSIS — T466X5A Adverse effect of antihyperlipidemic and antiarteriosclerotic drugs, initial encounter: Secondary | ICD-10-CM

## 2024-01-10 DIAGNOSIS — R0600 Dyspnea, unspecified: Secondary | ICD-10-CM | POA: Diagnosis not present

## 2024-01-10 MED ORDER — HYDROCODONE BIT-HOMATROP MBR 5-1.5 MG/5ML PO SOLN: 5.0000 mL | Freq: Four times a day (QID) | ORAL | 0 refills | Status: AC | PRN

## 2024-01-10 MED ORDER — BETAMETHASONE SOD PHOS & ACET 6 (3-3) MG/ML IJ SUSP
6.0000 mg | Freq: Once | INTRAMUSCULAR | Status: AC
  Administered 2024-01-10: 6 mg via INTRAMUSCULAR

## 2024-01-10 MED ORDER — NEXLETOL 180 MG PO TABS
180.0000 mg | ORAL_TABLET | Freq: Every day | ORAL | 1 refills | Status: AC
Start: 2024-01-10 — End: ?

## 2024-01-10 MED ORDER — CEFPROZIL 500 MG PO TABS: 500.0000 mg | ORAL_TABLET | Freq: Two times a day (BID) | ORAL | 0 refills | Status: AC

## 2024-01-10 NOTE — Telephone Encounter (Signed)
 Pharmacy Patient Advocate Encounter   Received notification from CoverMyMeds that prior authorization for Nexletol 180MG  tablets is required/requested.   Insurance verification completed.   The patient is insured through [facility] .   Per test claim: PA required; PA submitted to above mentioned insurance via CoverMyMeds Key/confirmation #/EOC [id number] Status is pending

## 2024-01-10 NOTE — Progress Notes (Signed)
 Subjective:  Patient ID: Cassandra [name], female    DOB: [birth year]  Age: 68 y.o. MRN: [id number]  CC: Hypertension (170-190 / 90's/Seem to happen after having the flu really bad a month ago. ) and Cough (Ongoing cough and mucous since having flu 2 month ago. Clear to yellow mucous. A lot of drainage still. Already completed abx and prednisone .  No SOB.)   HPI Cassandra [name] presents for cough producing rubbery sputum at times. Denies dyspnea. Very deep cough. No fever.  She is also very concerned about blood pressure.  It has been high at home.  However she and the nurse worked on her  technique and she thinks she was doing it wrong.  Although it is still high it should not be a problem for her in the short run.  Patient also is concerned about atherosclerosis.  She has not been able to take statins in the past.  Wonders what to do about atherosclerosis especially in light of the elevated blood pressure and additive risks for heart disease and stroke.  She has tried multiple statins in the past.  Most recent someone was Livalo and it is listed as having caused seizures.         01/10/2024    9:18 AM 11/30/2023   12:11 PM 10/23/2023   10:10 AM  Depression screen PHQ 2/9  Decreased Interest 0 1 0  Down, Depressed, Hopeless 0 1 0  PHQ - 2 Score 0 2 0  Altered sleeping 0 1   Tired, decreased energy 0 1   Change in appetite 0 1   Feeling bad or failure about yourself  0 0   Trouble concentrating 0 0   Moving slowly or fidgety/restless 0 0   Suicidal thoughts 0 0   PHQ-9 Score 0 5   Difficult doing work/chores Not difficult at all Not difficult at all     History Cassandra [name] has a past medical history of Anxiety, Bowel habit changes, Chronic left shoulder pain, Chronic pain, COPD (chronic obstructive pulmonary disease) (HCC), Depression, Emphysema lung (HCC), Fecal incontinence, GERD (gastroesophageal reflux disease), Hyperlipidemia, IBS (irritable bowel syndrome), Iliotibial band syndrome of right  side (09/20/2013), Maternal complication related to childbirth, Meniere's disease, Nicotine addiction, Orthostatic hypotension (06/12/2019), Skin cancer, Syncope and collapse (06/12/2019), Tubular adenoma of colon (01/31/2000), and Weakness of pelvic floor.   She has a past surgical history that includes Bladder repair; Shoulder arthroscopy; Abdominal hysterectomy; Knee arthroscopy (Left); Cesarean section; ruptured uterus; Anal Rectal manometry (N/A, 12/09/2015); and Breast biopsy (Right, 12/12/2022).   Her family history includes COPD in her brother; Colon cancer in an other family member; Colon polyps in her sister; Coronary artery disease in her brother, father, and mother; Diabetes in her mother; Early death in her son; Heart disease in her sister; Kidney disease in her mother; Liver cancer in her maternal grandmother; Liver disease in her maternal grandmother; Lung cancer in her paternal grandmother; Lung disease in her father; Polymyalgia rheumatica in her sister; Prostate cancer in her father.She reports that she quit smoking about 8 years ago. Her smoking use included cigarettes. She started smoking about 50 years ago. She has a 42 pack-year smoking history. She has never used smokeless tobacco. She reports that she does not drink alcohol  and does not use drugs.    ROS Review of Systems  Constitutional: Negative.   HENT:  Positive for congestion.   Eyes:  Negative for visual disturbance.  Respiratory:  Positive for cough. Negative for  shortness of breath.   Cardiovascular:  Negative for chest pain.  Gastrointestinal:  Negative for abdominal pain.  Musculoskeletal:  Negative for arthralgias.    Objective:  BP (!) 153/80   Pulse 77   Temp (!) 97.3 F (36.3 C)   Ht 5\' 6"  (1.676 m)   Wt 152 lb (68.9 kg)   SpO2 97%   BMI 24.53 kg/m   BP Readings from Last 3 Encounters:  01/10/24 (!) 153/80  12/21/23 (!) 192/90  11/30/23 (!) 168/72    Wt Readings from Last 3 Encounters:  01/10/24  152 lb (68.9 kg)  12/21/23 155 lb 3.2 oz (70.4 kg)  11/30/23 159 lb (72.1 kg)     Physical Exam Constitutional:      General: She is not in acute distress.    Appearance: She is well-developed.  Cardiovascular:     Rate and Rhythm: Normal rate and regular rhythm.  Pulmonary:     Breath sounds: Wheezing present.  Musculoskeletal:        General: Normal range of motion.  Skin:    General: Skin is warm and dry.  Neurological:     Mental Status: She is alert and oriented to person, place, and time.      Assessment & Plan:  Acute cough -     DG Chest 2 View; Future -     Betamethasone  Sod Phos & Acet -     HYDROcodone  Bit-Homatrop MBr; Take 5 mLs by mouth every 6 (six) hours as needed for up to 5 days for cough.  Dispense: 100 mL; Refill: 0  Statin myopathy -     Nexletol; Take 1 tablet (180 mg total) by mouth daily.  Dispense: 90 tablet; Refill: 1  Acute exacerbation of chronic obstructive pulmonary disease (COPD) (HCC) -     Betamethasone  Sod Phos & Acet -     Cefprozil; Take 1 tablet (500 mg total) by mouth 2 (two) times daily. For infection. Take all of this medication.  Dispense: 20 tablet; Refill: 0  Mixed hyperlipidemia -     Nexletol; Take 1 tablet (180 mg total) by mouth daily.  Dispense: 90 tablet; Refill: 1     Follow-up: Return if symptoms worsen or fail to improve.  Roise Cleaver, M.D.

## 2024-01-11 ENCOUNTER — Telehealth: Payer: Self-pay

## 2024-01-11 NOTE — Telephone Encounter (Signed)
 Received fax for additional information. Request requires assistance from the office. Separate encounter routed to pool for follow-up. For additional info see Pharmacy Prior Auth telephone encounter from 01/10/24.

## 2024-01-11 NOTE — Telephone Encounter (Signed)
 Prior Authorization form/request asks a question that requires your assistance. Please see the question below and advise accordingly. The PA will not be submitted until the necessary information is received.

## 2024-01-11 NOTE — Telephone Encounter (Signed)
 Copied from CRM [id number]. Topic: Clinical - Prescription Issue >> Jan 11, 2024 11:20 AM Retta Caster wrote: Reason for CRM: Bempedoic Acid  (NEXLETOL ) 180 MG TABS  June from [employer] Needs call back due to Clinical questions for process of Prior auth   [phone]  Ref# [id number]  Needs to be done by: 05/03 at 6:14am CNT

## 2024-01-11 NOTE — Telephone Encounter (Signed)
 Called to notify pt and schedule lab visit. Unable to lm on vm on 5/1 LS

## 2024-01-12 ENCOUNTER — Other Ambulatory Visit: Payer: Self-pay | Admitting: Family Medicine

## 2024-01-12 DIAGNOSIS — J069 Acute upper respiratory infection, unspecified: Secondary | ICD-10-CM

## 2024-01-13 ENCOUNTER — Other Ambulatory Visit: Payer: Self-pay | Admitting: Family Medicine

## 2024-01-13 DIAGNOSIS — G720 Drug-induced myopathy: Secondary | ICD-10-CM

## 2024-01-13 DIAGNOSIS — E782 Mixed hyperlipidemia: Secondary | ICD-10-CM

## 2024-01-16 NOTE — Telephone Encounter (Signed)
 Pharmacy Patient Advocate Encounter  Received notification from [employer] that Prior Authorization for Nexletol  180mg  has been DENIED.  Full denial letter will be uploaded to the media tab. See denial reason below.   PA #/Case ID/Reference #: [id number]

## 2024-01-17 ENCOUNTER — Other Ambulatory Visit (HOSPITAL_COMMUNITY): Payer: Self-pay

## 2024-01-23 NOTE — Telephone Encounter (Signed)
 Refer to phone call from 5/1

## 2024-09-26 ENCOUNTER — Encounter: Payer: Self-pay | Admitting: Acute Care

## 2024-10-04 ENCOUNTER — Ambulatory Visit (HOSPITAL_COMMUNITY)

## 2024-10-23 ENCOUNTER — Ambulatory Visit: Payer: Medicare Other

## 2024-10-24 ENCOUNTER — Ambulatory Visit: Payer: Self-pay
